# Patient Record
Sex: Male | Born: 1935 | Race: White | Hispanic: No | Smoking: Never smoker
Health system: Southern US, Community
[De-identification: ages and names within clinical notes are randomized; demographics above are authoritative.]

## PROBLEM LIST (undated history)

## (undated) DIAGNOSIS — I499 Cardiac arrhythmia, unspecified: Secondary | ICD-10-CM

## (undated) DIAGNOSIS — IMO0001 Reserved for inherently not codable concepts without codable children: Secondary | ICD-10-CM

## (undated) DIAGNOSIS — I482 Chronic atrial fibrillation, unspecified: Secondary | ICD-10-CM

## (undated) DIAGNOSIS — I872 Venous insufficiency (chronic) (peripheral): Secondary | ICD-10-CM

## (undated) DIAGNOSIS — N281 Cyst of kidney, acquired: Secondary | ICD-10-CM

## (undated) DIAGNOSIS — N183 Chronic kidney disease, stage 3 unspecified: Secondary | ICD-10-CM

## (undated) DIAGNOSIS — Z974 Presence of external hearing-aid: Secondary | ICD-10-CM

## (undated) DIAGNOSIS — E538 Deficiency of other specified B group vitamins: Secondary | ICD-10-CM

## (undated) DIAGNOSIS — E785 Hyperlipidemia, unspecified: Secondary | ICD-10-CM

## (undated) DIAGNOSIS — I7143 Infrarenal abdominal aortic aneurysm, without rupture: Secondary | ICD-10-CM

## (undated) DIAGNOSIS — R600 Localized edema: Secondary | ICD-10-CM

## (undated) DIAGNOSIS — Z7901 Long term (current) use of anticoagulants: Secondary | ICD-10-CM

## (undated) DIAGNOSIS — Z8679 Personal history of other diseases of the circulatory system: Secondary | ICD-10-CM

## (undated) DIAGNOSIS — G629 Polyneuropathy, unspecified: Secondary | ICD-10-CM

## (undated) DIAGNOSIS — N201 Calculus of ureter: Secondary | ICD-10-CM

## (undated) DIAGNOSIS — Z8739 Personal history of other diseases of the musculoskeletal system and connective tissue: Secondary | ICD-10-CM

## (undated) DIAGNOSIS — I714 Abdominal aortic aneurysm, without rupture: Secondary | ICD-10-CM

## (undated) DIAGNOSIS — I739 Peripheral vascular disease, unspecified: Secondary | ICD-10-CM

## (undated) DIAGNOSIS — H409 Unspecified glaucoma: Secondary | ICD-10-CM

## (undated) DIAGNOSIS — M1990 Unspecified osteoarthritis, unspecified site: Secondary | ICD-10-CM

## (undated) DIAGNOSIS — N1330 Unspecified hydronephrosis: Secondary | ICD-10-CM

## (undated) DIAGNOSIS — I509 Heart failure, unspecified: Secondary | ICD-10-CM

## (undated) DIAGNOSIS — Z87442 Personal history of urinary calculi: Secondary | ICD-10-CM

## (undated) HISTORY — DX: Reserved for inherently not codable concepts without codable children: IMO0001

## (undated) HISTORY — PX: TRANSTHORACIC ECHOCARDIOGRAM: SHX275

## (undated) HISTORY — DX: Unspecified osteoarthritis, unspecified site: M19.90

## (undated) HISTORY — DX: Chronic atrial fibrillation, unspecified: I48.20

## (undated) HISTORY — PX: ABDOMINAL AORTAGRAM: SHX5706

## (undated) HISTORY — DX: Deficiency of other specified B group vitamins: E53.8

## (undated) HISTORY — DX: Long term (current) use of anticoagulants: Z79.01

## (undated) HISTORY — DX: Heart failure, unspecified: I50.9

## (undated) HISTORY — DX: Chronic kidney disease, stage 3 unspecified: N18.30

## (undated) HISTORY — PX: OTHER SURGICAL HISTORY: SHX169

## (undated) HISTORY — DX: Chronic kidney disease, stage 3 (moderate): N18.3

## (undated) HISTORY — DX: Hyperlipidemia, unspecified: E78.5

## (undated) HISTORY — DX: Cardiac arrhythmia, unspecified: I49.9

## (undated) HISTORY — DX: Polyneuropathy, unspecified: G62.9

---

## 1968-12-25 HISTORY — PX: PERCUTANEOUS NEPHROSTOLITHOTOMY: SHX2207

## 1995-01-25 HISTORY — PX: REPAIR THORACIC AORTA: SUR1211

## 2003-04-19 ENCOUNTER — Emergency Department (HOSPITAL_COMMUNITY): Admission: EM | Admit: 2003-04-19 | Discharge: 2003-04-19 | Payer: Self-pay | Admitting: Emergency Medicine

## 2003-04-25 ENCOUNTER — Ambulatory Visit (HOSPITAL_COMMUNITY): Admission: RE | Admit: 2003-04-25 | Discharge: 2003-04-25 | Payer: Self-pay | Admitting: Cardiology

## 2003-04-25 HISTORY — PX: CARDIOVASCULAR STRESS TEST: SHX262

## 2003-04-27 HISTORY — PX: LAPAROSCOPIC CHOLECYSTECTOMY: SUR755

## 2003-05-03 ENCOUNTER — Inpatient Hospital Stay (HOSPITAL_COMMUNITY): Admission: RE | Admit: 2003-05-03 | Discharge: 2003-05-09 | Payer: Self-pay | Admitting: General Surgery

## 2003-05-24 ENCOUNTER — Encounter: Admission: RE | Admit: 2003-05-24 | Discharge: 2003-05-24 | Payer: Self-pay | Admitting: Cardiology

## 2003-06-05 ENCOUNTER — Encounter: Admission: RE | Admit: 2003-06-05 | Discharge: 2003-06-05 | Payer: Self-pay | Admitting: Cardiology

## 2003-07-16 ENCOUNTER — Ambulatory Visit (HOSPITAL_COMMUNITY): Admission: RE | Admit: 2003-07-16 | Discharge: 2003-07-17 | Payer: Self-pay | Admitting: Cardiology

## 2003-08-12 ENCOUNTER — Inpatient Hospital Stay (HOSPITAL_COMMUNITY): Admission: RE | Admit: 2003-08-12 | Discharge: 2003-08-14 | Payer: Self-pay | Admitting: Vascular Surgery

## 2003-08-28 ENCOUNTER — Ambulatory Visit (HOSPITAL_COMMUNITY): Admission: RE | Admit: 2003-08-28 | Discharge: 2003-08-28 | Payer: Self-pay | Admitting: Cardiology

## 2003-12-11 ENCOUNTER — Encounter: Admission: RE | Admit: 2003-12-11 | Discharge: 2003-12-11 | Payer: Self-pay | Admitting: Vascular Surgery

## 2004-03-13 ENCOUNTER — Ambulatory Visit: Payer: Self-pay | Admitting: Cardiology

## 2004-04-10 ENCOUNTER — Ambulatory Visit: Payer: Self-pay | Admitting: *Deleted

## 2004-05-08 ENCOUNTER — Ambulatory Visit: Payer: Self-pay | Admitting: *Deleted

## 2004-06-08 ENCOUNTER — Ambulatory Visit: Payer: Self-pay | Admitting: Cardiology

## 2004-06-10 ENCOUNTER — Encounter: Admission: RE | Admit: 2004-06-10 | Discharge: 2004-06-10 | Payer: Self-pay | Admitting: Vascular Surgery

## 2004-07-08 ENCOUNTER — Ambulatory Visit: Payer: Self-pay | Admitting: Cardiovascular Disease

## 2004-08-05 ENCOUNTER — Ambulatory Visit: Payer: Self-pay | Admitting: *Deleted

## 2004-09-03 ENCOUNTER — Ambulatory Visit: Payer: Self-pay | Admitting: Cardiology

## 2004-10-15 ENCOUNTER — Ambulatory Visit: Payer: Self-pay | Admitting: Cardiology

## 2004-10-23 ENCOUNTER — Ambulatory Visit (HOSPITAL_COMMUNITY): Admission: RE | Admit: 2004-10-23 | Discharge: 2004-10-23 | Payer: Self-pay | Admitting: Family Medicine

## 2004-11-18 ENCOUNTER — Ambulatory Visit: Payer: Self-pay | Admitting: *Deleted

## 2004-12-21 ENCOUNTER — Ambulatory Visit: Payer: Self-pay | Admitting: *Deleted

## 2005-01-18 ENCOUNTER — Ambulatory Visit: Payer: Self-pay | Admitting: Cardiology

## 2005-02-15 ENCOUNTER — Ambulatory Visit: Payer: Self-pay | Admitting: *Deleted

## 2005-03-23 ENCOUNTER — Ambulatory Visit: Payer: Self-pay | Admitting: *Deleted

## 2005-04-21 ENCOUNTER — Ambulatory Visit: Payer: Self-pay | Admitting: Cardiology

## 2005-05-24 ENCOUNTER — Ambulatory Visit: Payer: Self-pay | Admitting: *Deleted

## 2005-06-16 ENCOUNTER — Encounter: Admission: RE | Admit: 2005-06-16 | Discharge: 2005-06-16 | Payer: Self-pay | Admitting: Vascular Surgery

## 2005-06-25 ENCOUNTER — Ambulatory Visit: Payer: Self-pay | Admitting: *Deleted

## 2005-08-04 ENCOUNTER — Ambulatory Visit: Payer: Self-pay | Admitting: *Deleted

## 2005-09-01 ENCOUNTER — Ambulatory Visit: Payer: Self-pay | Admitting: *Deleted

## 2005-10-08 ENCOUNTER — Ambulatory Visit: Payer: Self-pay | Admitting: Cardiology

## 2005-11-03 ENCOUNTER — Ambulatory Visit: Payer: Self-pay | Admitting: Cardiology

## 2005-12-08 ENCOUNTER — Ambulatory Visit: Payer: Self-pay | Admitting: Cardiology

## 2006-01-12 ENCOUNTER — Ambulatory Visit: Payer: Self-pay | Admitting: Cardiology

## 2006-02-09 ENCOUNTER — Ambulatory Visit: Payer: Self-pay | Admitting: Cardiology

## 2006-03-15 ENCOUNTER — Ambulatory Visit: Payer: Self-pay | Admitting: Cardiology

## 2006-04-12 ENCOUNTER — Ambulatory Visit: Payer: Self-pay | Admitting: Cardiology

## 2006-05-17 ENCOUNTER — Ambulatory Visit: Payer: Self-pay | Admitting: Cardiovascular Disease

## 2006-06-15 ENCOUNTER — Encounter: Admission: RE | Admit: 2006-06-15 | Discharge: 2006-06-15 | Payer: Self-pay | Admitting: Vascular Surgery

## 2006-06-15 ENCOUNTER — Ambulatory Visit: Payer: Self-pay | Admitting: Vascular Surgery

## 2006-06-24 ENCOUNTER — Ambulatory Visit: Payer: Self-pay | Admitting: Cardiology

## 2006-07-22 ENCOUNTER — Ambulatory Visit: Payer: Self-pay | Admitting: Cardiology

## 2006-08-22 ENCOUNTER — Ambulatory Visit: Payer: Self-pay | Admitting: Internal Medicine

## 2006-09-20 ENCOUNTER — Ambulatory Visit: Payer: Self-pay | Admitting: Cardiology

## 2006-10-21 ENCOUNTER — Ambulatory Visit: Payer: Self-pay | Admitting: Cardiology

## 2006-11-04 ENCOUNTER — Ambulatory Visit: Payer: Self-pay | Admitting: Cardiology

## 2006-12-09 ENCOUNTER — Ambulatory Visit: Payer: Self-pay | Admitting: Cardiology

## 2007-01-06 ENCOUNTER — Ambulatory Visit: Payer: Self-pay | Admitting: Cardiology

## 2007-02-03 ENCOUNTER — Ambulatory Visit: Payer: Self-pay | Admitting: Internal Medicine

## 2007-03-10 ENCOUNTER — Ambulatory Visit: Payer: Self-pay | Admitting: Cardiology

## 2007-04-07 ENCOUNTER — Ambulatory Visit: Payer: Self-pay | Admitting: Cardiology

## 2007-05-05 ENCOUNTER — Ambulatory Visit: Payer: Self-pay | Admitting: Cardiology

## 2007-06-02 ENCOUNTER — Ambulatory Visit: Payer: Self-pay | Admitting: Cardiology

## 2007-06-30 ENCOUNTER — Ambulatory Visit: Payer: Self-pay | Admitting: Cardiology

## 2007-08-01 ENCOUNTER — Ambulatory Visit: Payer: Self-pay | Admitting: Cardiology

## 2007-08-31 ENCOUNTER — Ambulatory Visit: Payer: Self-pay | Admitting: Cardiology

## 2007-09-29 ENCOUNTER — Ambulatory Visit: Payer: Self-pay | Admitting: Cardiology

## 2007-11-01 ENCOUNTER — Ambulatory Visit: Payer: Self-pay | Admitting: Cardiology

## 2007-12-05 ENCOUNTER — Ambulatory Visit: Payer: Self-pay | Admitting: Cardiology

## 2008-01-02 ENCOUNTER — Encounter: Admission: RE | Admit: 2008-01-02 | Discharge: 2008-01-02 | Payer: Self-pay | Admitting: Vascular Surgery

## 2008-01-02 ENCOUNTER — Ambulatory Visit: Payer: Self-pay | Admitting: Vascular Surgery

## 2008-01-04 ENCOUNTER — Ambulatory Visit: Payer: Self-pay | Admitting: Cardiology

## 2008-02-05 ENCOUNTER — Ambulatory Visit: Payer: Self-pay | Admitting: Cardiology

## 2008-02-07 ENCOUNTER — Encounter: Payer: Self-pay | Admitting: Cardiology

## 2008-02-07 ENCOUNTER — Ambulatory Visit (HOSPITAL_COMMUNITY): Admission: RE | Admit: 2008-02-07 | Discharge: 2008-02-07 | Payer: Self-pay | Admitting: Cardiology

## 2008-02-07 ENCOUNTER — Ambulatory Visit: Payer: Self-pay | Admitting: Cardiology

## 2008-03-07 ENCOUNTER — Ambulatory Visit: Payer: Self-pay | Admitting: Cardiology

## 2008-04-04 ENCOUNTER — Ambulatory Visit: Payer: Self-pay | Admitting: Cardiology

## 2008-04-22 ENCOUNTER — Ambulatory Visit: Payer: Self-pay | Admitting: Cardiology

## 2008-05-20 ENCOUNTER — Ambulatory Visit: Payer: Self-pay | Admitting: Cardiology

## 2008-06-17 ENCOUNTER — Ambulatory Visit: Payer: Self-pay | Admitting: Cardiology

## 2008-07-18 ENCOUNTER — Ambulatory Visit: Payer: Self-pay | Admitting: Cardiology

## 2008-08-22 ENCOUNTER — Ambulatory Visit: Payer: Self-pay | Admitting: Cardiology

## 2008-09-19 ENCOUNTER — Ambulatory Visit: Payer: Self-pay | Admitting: Cardiology

## 2008-10-21 ENCOUNTER — Ambulatory Visit: Payer: Self-pay | Admitting: Cardiology

## 2008-11-13 ENCOUNTER — Encounter (INDEPENDENT_AMBULATORY_CARE_PROVIDER_SITE_OTHER): Payer: Self-pay | Admitting: *Deleted

## 2008-11-13 LAB — CONVERTED CEMR LAB
CO2: 26 meq/L
Calcium: 9.6 mg/dL
Chloride: 100 meq/L
Glucose, Bld: 98 mg/dL
Hemoglobin: 13.5 g/dL
Prothrombin Time: 12.8 s
Sodium: 136 meq/L

## 2008-11-18 ENCOUNTER — Ambulatory Visit: Payer: Self-pay | Admitting: Cardiology

## 2008-12-09 ENCOUNTER — Encounter: Payer: Self-pay | Admitting: *Deleted

## 2008-12-23 ENCOUNTER — Ambulatory Visit: Payer: Self-pay | Admitting: Cardiology

## 2008-12-23 LAB — CONVERTED CEMR LAB: POC INR: 2.6

## 2009-01-13 ENCOUNTER — Encounter (INDEPENDENT_AMBULATORY_CARE_PROVIDER_SITE_OTHER): Payer: Self-pay | Admitting: *Deleted

## 2009-01-16 ENCOUNTER — Ambulatory Visit: Payer: Self-pay | Admitting: Vascular Surgery

## 2009-01-16 ENCOUNTER — Encounter: Payer: Self-pay | Admitting: Cardiology

## 2009-01-16 ENCOUNTER — Encounter: Admission: RE | Admit: 2009-01-16 | Discharge: 2009-01-16 | Payer: Self-pay | Admitting: Vascular Surgery

## 2009-01-23 ENCOUNTER — Ambulatory Visit: Payer: Self-pay | Admitting: Cardiology

## 2009-01-23 LAB — CONVERTED CEMR LAB: POC INR: 2.4

## 2009-02-18 DIAGNOSIS — I482 Chronic atrial fibrillation, unspecified: Secondary | ICD-10-CM

## 2009-02-18 DIAGNOSIS — E785 Hyperlipidemia, unspecified: Secondary | ICD-10-CM | POA: Insufficient documentation

## 2009-02-18 DIAGNOSIS — M109 Gout, unspecified: Secondary | ICD-10-CM

## 2009-02-21 ENCOUNTER — Ambulatory Visit: Payer: Self-pay | Admitting: Cardiology

## 2009-02-21 ENCOUNTER — Encounter (INDEPENDENT_AMBULATORY_CARE_PROVIDER_SITE_OTHER): Payer: Self-pay | Admitting: *Deleted

## 2009-02-21 DIAGNOSIS — E538 Deficiency of other specified B group vitamins: Secondary | ICD-10-CM

## 2009-02-25 ENCOUNTER — Encounter: Payer: Self-pay | Admitting: Cardiology

## 2009-02-26 ENCOUNTER — Encounter (INDEPENDENT_AMBULATORY_CARE_PROVIDER_SITE_OTHER): Payer: Self-pay | Admitting: *Deleted

## 2009-03-03 ENCOUNTER — Encounter (INDEPENDENT_AMBULATORY_CARE_PROVIDER_SITE_OTHER): Payer: Self-pay | Admitting: *Deleted

## 2009-03-03 LAB — CONVERTED CEMR LAB
OCCULT 1: NEGATIVE
OCCULT 1: NEGATIVE
OCCULT 2: NEGATIVE
OCCULT 2: NEGATIVE
OCCULT 3: NEGATIVE

## 2009-03-27 ENCOUNTER — Ambulatory Visit: Payer: Self-pay | Admitting: Cardiology

## 2009-04-24 ENCOUNTER — Ambulatory Visit: Payer: Self-pay | Admitting: Cardiology

## 2009-04-24 LAB — CONVERTED CEMR LAB: POC INR: 2.3

## 2009-05-01 ENCOUNTER — Encounter (INDEPENDENT_AMBULATORY_CARE_PROVIDER_SITE_OTHER): Payer: Self-pay

## 2009-05-01 LAB — CONVERTED CEMR LAB
BUN: 38 mg/dL — ABNORMAL HIGH (ref 6–23)
CO2: 24 meq/L (ref 19–32)
Calcium: 9.3 mg/dL (ref 8.4–10.5)
Chloride: 107 meq/L (ref 96–112)
Creatinine, Ser: 1.82 mg/dL — ABNORMAL HIGH (ref 0.40–1.50)
Digitoxin Lvl: 1.7 ng/mL (ref 0.8–2.0)
Glucose, Bld: 86 mg/dL (ref 70–99)
LDL Cholesterol: 76 mg/dL (ref 0–99)

## 2009-05-12 ENCOUNTER — Ambulatory Visit (HOSPITAL_COMMUNITY): Admission: RE | Admit: 2009-05-12 | Discharge: 2009-05-12 | Payer: Self-pay | Admitting: Family Medicine

## 2009-05-19 ENCOUNTER — Ambulatory Visit (HOSPITAL_COMMUNITY): Admission: RE | Admit: 2009-05-19 | Discharge: 2009-05-19 | Payer: Self-pay | Admitting: Cardiology

## 2009-05-19 ENCOUNTER — Ambulatory Visit: Payer: Self-pay | Admitting: Cardiology

## 2009-05-19 LAB — CONVERTED CEMR LAB: POC INR: 2.3

## 2009-06-18 ENCOUNTER — Ambulatory Visit: Payer: Self-pay | Admitting: Cardiology

## 2009-07-17 ENCOUNTER — Ambulatory Visit: Payer: Self-pay | Admitting: Cardiology

## 2009-07-17 LAB — CONVERTED CEMR LAB: POC INR: 2.2

## 2009-08-14 ENCOUNTER — Ambulatory Visit: Payer: Self-pay | Admitting: Cardiology

## 2009-08-14 LAB — CONVERTED CEMR LAB: POC INR: 2

## 2009-09-11 ENCOUNTER — Ambulatory Visit: Payer: Self-pay | Admitting: Cardiology

## 2009-09-24 ENCOUNTER — Encounter (INDEPENDENT_AMBULATORY_CARE_PROVIDER_SITE_OTHER): Payer: Self-pay | Admitting: *Deleted

## 2009-09-24 LAB — CONVERTED CEMR LAB
ALT: 10 units/L
Albumin: 4.5 g/dL
Alkaline Phosphatase: 67 units/L
Bilirubin, Direct: 0.2 mg/dL
CO2: 24 meq/L
Glucose, Bld: 103 mg/dL
LDL Cholesterol: 78 mg/dL
Potassium: 5.3 meq/L
Sodium: 140 meq/L
Total Protein: 7.4 g/dL

## 2009-09-25 LAB — CONVERTED CEMR LAB
Alkaline Phosphatase: 67 units/L
BUN: 35 mg/dL
Bilirubin, Direct: 0.2 mg/dL
CO2: 24 meq/L
Cholesterol: 134 mg/dL
Creatinine, Ser: 2.03 mg/dL
Glucose, Bld: 103 mg/dL
Triglycerides: 99 mg/dL

## 2009-10-09 ENCOUNTER — Encounter (INDEPENDENT_AMBULATORY_CARE_PROVIDER_SITE_OTHER): Payer: Self-pay | Admitting: *Deleted

## 2009-10-09 ENCOUNTER — Ambulatory Visit: Payer: Self-pay | Admitting: Cardiology

## 2009-11-12 ENCOUNTER — Ambulatory Visit: Payer: Self-pay | Admitting: Cardiology

## 2009-11-12 LAB — CONVERTED CEMR LAB: POC INR: 1.9

## 2009-12-10 ENCOUNTER — Ambulatory Visit: Payer: Self-pay | Admitting: Cardiology

## 2009-12-10 LAB — CONVERTED CEMR LAB: POC INR: 1.5

## 2009-12-24 ENCOUNTER — Ambulatory Visit: Payer: Self-pay | Admitting: Cardiology

## 2010-01-21 ENCOUNTER — Encounter (INDEPENDENT_AMBULATORY_CARE_PROVIDER_SITE_OTHER): Payer: Self-pay

## 2010-01-21 ENCOUNTER — Ambulatory Visit: Payer: Self-pay | Admitting: Cardiology

## 2010-01-27 ENCOUNTER — Encounter: Payer: Self-pay | Admitting: Cardiology

## 2010-02-02 ENCOUNTER — Ambulatory Visit: Payer: Self-pay | Admitting: Cardiology

## 2010-02-02 ENCOUNTER — Encounter (INDEPENDENT_AMBULATORY_CARE_PROVIDER_SITE_OTHER): Payer: Self-pay | Admitting: *Deleted

## 2010-02-02 LAB — CONVERTED CEMR LAB
BUN: 29 mg/dL — ABNORMAL HIGH (ref 6–23)
CO2: 29 meq/L (ref 19–32)
Cholesterol: 123 mg/dL (ref 0–200)
Glucose, Bld: 97 mg/dL (ref 70–99)
POC INR: 2.2
Potassium: 5.5 meq/L — ABNORMAL HIGH (ref 3.5–5.3)
Sodium: 144 meq/L (ref 135–145)
Total CHOL/HDL Ratio: 3
VLDL: 16 mg/dL (ref 0–40)

## 2010-02-18 ENCOUNTER — Ambulatory Visit: Payer: Self-pay | Admitting: Vascular Surgery

## 2010-02-18 ENCOUNTER — Encounter: Admission: RE | Admit: 2010-02-18 | Discharge: 2010-02-18 | Payer: Self-pay | Admitting: Vascular Surgery

## 2010-03-02 ENCOUNTER — Ambulatory Visit: Payer: Self-pay | Admitting: Cardiology

## 2010-03-02 LAB — CONVERTED CEMR LAB: POC INR: 2.1

## 2010-03-31 LAB — CONVERTED CEMR LAB
ALT: 9 units/L (ref 0–53)
Basophils Absolute: 0 10*3/uL (ref 0.0–0.1)
CO2: 28 meq/L (ref 19–32)
Calcium: 9.1 mg/dL (ref 8.4–10.5)
Chloride: 106 meq/L (ref 96–112)
Cholesterol: 126 mg/dL (ref 0–200)
Creatinine, Ser: 2.13 mg/dL — ABNORMAL HIGH (ref 0.40–1.50)
Eosinophils Relative: 2 % (ref 0–5)
Glucose, Bld: 103 mg/dL — ABNORMAL HIGH (ref 70–99)
HCT: 41.7 % (ref 39.0–52.0)
Hemoglobin: 13.5 g/dL (ref 13.0–17.0)
Lymphocytes Relative: 20 % (ref 12–46)
Lymphs Abs: 1 10*3/uL (ref 0.7–4.0)
Monocytes Absolute: 0.4 10*3/uL (ref 0.1–1.0)
Monocytes Relative: 8 % (ref 3–12)
Neutro Abs: 3.4 10*3/uL (ref 1.7–7.7)
RBC: 4.15 M/uL — ABNORMAL LOW (ref 4.22–5.81)
RDW: 13.9 % (ref 11.5–15.5)
Total CHOL/HDL Ratio: 3.3
Total Protein: 7 g/dL (ref 6.0–8.3)
Triglycerides: 79 mg/dL (ref <150)
WBC: 4.9 10*3/uL (ref 4.0–10.5)

## 2010-04-02 ENCOUNTER — Ambulatory Visit: Payer: Self-pay | Admitting: Cardiology

## 2010-04-02 LAB — CONVERTED CEMR LAB: POC INR: 2.6

## 2010-04-26 HISTORY — PX: CATARACT EXTRACTION W/ INTRAOCULAR LENS  IMPLANT, BILATERAL: SHX1307

## 2010-04-30 ENCOUNTER — Ambulatory Visit: Admission: RE | Admit: 2010-04-30 | Discharge: 2010-04-30 | Payer: Self-pay | Source: Home / Self Care

## 2010-04-30 LAB — CONVERTED CEMR LAB: POC INR: 3.2

## 2010-05-16 ENCOUNTER — Encounter: Payer: Self-pay | Admitting: Cardiology

## 2010-05-18 ENCOUNTER — Encounter: Payer: Self-pay | Admitting: Nephrology

## 2010-05-26 NOTE — Miscellaneous (Signed)
Summary: medication change  Clinical Lists Changes  Medications: Changed medication from DIGOXIN 0.125 MG TABS (DIGOXIN) take 1 tab daily to DIGOXIN 0.125 MG TABS (DIGOXIN) take 1 tablet by mouth 5 days a week then hold for 2 days

## 2010-05-26 NOTE — Medication Information (Signed)
Summary: ccr-lr  Anticoagulant Therapy  Managed by: Vashti Hey, RN PCP: Dr. Lubertha South Supervising MD: Dietrich Pates MD, Molly Maduro Indication 1: Atrial Fibrillation (ICD-427.31) Lab Used: [facility] [place] Site: [place] INR POC 2.8  Dietary changes: no    Health status changes: no    Bleeding/hemorrhagic complications: no    Recent/future hospitalizations: no    Any changes in medication regimen? no    Recent/future dental: no  Any missed doses?: no       Is patient compliant with meds? yes       Allergies: No Known Drug Allergies  Anticoagulation Management History:      The patient is taking warfarin and comes in today for a routine follow up visit.  Positive risk factors for bleeding include an age of 75 years or older and presence of serious comorbidities.  The bleeding index is 'intermediate risk'.  Positive CHADS2 values include History of HTN and Age > 50 years old.  The start date was 10/14/1997.  His last INR was 1.0.  Anticoagulation responsible provider: Dietrich Pates MD, Molly Maduro.  INR POC: 2.8.  Cuvette Lot#: [id number].  Exp: 10/11.    Anticoagulation Management Assessment/Plan:      The patient's current anticoagulation dose is Coumadin 5 mg tabs: take as directed by coumadin clinic.  The target INR is 2 - 3.  The next INR is due 01/21/2010.  Anticoagulation instructions were given to patient.  Results were reviewed/authorized by Vashti Hey, RN.  He was notified by Vashti Hey RN.         Prior Anticoagulation Instructions: INR 1.5 Take coumadin 1 1/2 tablets tonight then increase dose to 1/2 tablet once daily except 1 tablet on Wednesdays and Saturdays  Current Anticoagulation Instructions: INR 2.8 Continue coumadin 2.5mg  once daily except 5mg  on Wednesdays and Saturdays

## 2010-05-26 NOTE — Medication Information (Signed)
Summary: ccr-lr  Anticoagulant Therapy  Managed by: Vashti Hey, RN PCP: Dr. Lubertha South Supervising MD: Daleen Squibb MD, Maisie Fus Indication 1: Atrial Fibrillation (ICD-427.31) Lab Used: [facility] [place] Site: [place] INR POC 1.5  Dietary changes: no    Health status changes: no    Bleeding/hemorrhagic complications: no    Recent/future hospitalizations: no    Any changes in medication regimen? no    Recent/future dental: no  Any missed doses?: no       Is patient compliant with meds? yes       Allergies: No Known Drug Allergies  Anticoagulation Management History:      The patient is taking warfarin and comes in today for a routine follow up visit.  Positive risk factors for bleeding include an age of 75 years or older and presence of serious comorbidities.  The bleeding index is 'intermediate risk'.  Positive CHADS2 values include History of HTN and Age > 68 years old.  The start date was 10/14/1997.  His last INR was 1.0.  Anticoagulation responsible provider: Daleen Squibb MD, Maisie Fus.  INR POC: 1.5.  Cuvette Lot#: [id number].  Exp: 10/11.    Anticoagulation Management Assessment/Plan:      The patient's current anticoagulation dose is Coumadin 5 mg tabs: take as directed by coumadin clinic.  The target INR is 2 - 3.  The next INR is due 12/24/2009.  Anticoagulation instructions were given to patient.  Results were reviewed/authorized by Vashti Hey, RN.  He was notified by Vashti Hey RN.         Prior Anticoagulation Instructions: INR 1.9 Increase coumadin 2.5mg  once daily except 5mg  on Wednesdays  Current Anticoagulation Instructions: INR 1.5 Take coumadin 1 1/2 tablets tonight then increase dose to 1/2 tablet once daily except 1 tablet on Wednesdays and Saturdays

## 2010-05-26 NOTE — Miscellaneous (Signed)
Summary: LABS BMP,LIPIDS,LIVER,09/24/2009  Clinical Lists Changes  Observations: Added new observation of CALCIUM: 10.1 mg/dL (29/93/7169 6:78) Added new observation of ALBUMIN: 4.5 g/dL (93/81/0175 1:02) Added new observation of PROTEIN, TOT: 7.4 g/dL (58/52/7782 4:23) Added new observation of SGPT (ALT): 10 units/L (09/24/2009 9:33) Added new observation of SGOT (AST): 12 units/L (09/24/2009 9:33) Added new observation of ALK PHOS: 67 units/L (09/24/2009 9:33) Added new observation of BILI DIRECT: 0.2 mg/dL (53/61/4431 5:40) Added new observation of CREATININE: 2.03 mg/dL (08/67/6195 0:93) Added new observation of BUN: 35 mg/dL (26/71/2458 0:99) Added new observation of BG RANDOM: 103 mg/dL (83/38/2505 3:97) Added new observation of CO2 PLSM/SER: 24 meq/L (09/24/2009 9:33) Added new observation of CL SERUM: 104 meq/L (09/24/2009 9:33) Added new observation of K SERUM: 5.3 meq/L (09/24/2009 9:33) Added new observation of NA: 140 meq/L (09/24/2009 9:33) Added new observation of LDL: 78 mg/dL (67/34/1937 9:02) Added new observation of HDL: 36 mg/dL (40/97/3532 9:92) Added new observation of TRIGLYC TOT: 99 mg/dL (42/68/3419 6:22) Added new observation of CHOLESTEROL: 134 mg/dL (29/79/8921 1:94)

## 2010-05-26 NOTE — Medication Information (Signed)
Summary: ccr-lr  Anticoagulant Therapy  Managed by: Vashti Hey, RN PCP: Dr. Lubertha South Supervising MD: Dietrich Pates MD, Molly Maduro Indication 1: Atrial Fibrillation (ICD-427.31) Lab Used: [facility] [place] Site: [place] INR POC 3.9  Dietary changes: no    Health status changes: no    Bleeding/hemorrhagic complications: no    Recent/future hospitalizations: no    Any changes in medication regimen? no    Recent/future dental: no  Any missed doses?: no       Is patient compliant with meds? yes       Allergies: No Known Drug Allergies  Anticoagulation Management History:      The patient is taking warfarin and comes in today for a routine follow up visit.  Positive risk factors for bleeding include an age of 75 years or older and presence of serious comorbidities.  The bleeding index is 'intermediate risk'.  Positive CHADS2 values include History of HTN and Age > 55 years old.  The start date was 10/14/1997.  His last INR was 1.0.  Anticoagulation responsible provider: Dietrich Pates MD, Molly Maduro.  INR POC: 3.9.  Cuvette Lot#: [id number].  Exp: 10/11.    Anticoagulation Management Assessment/Plan:      The patient's current anticoagulation dose is Coumadin 5 mg tabs: take as directed by coumadin clinic.  The target INR is 2 - 3.  The next INR is due 02/02/2010.  Anticoagulation instructions were given to patient.  Results were reviewed/authorized by Vashti Hey, RN.  He was notified by Vashti Hey RN.         Prior Anticoagulation Instructions: INR 2.8 Continue coumadin 2.5mg  once daily except 5mg  on Wednesdays and Saturdays  Current Anticoagulation Instructions: INR 3.9 Hold coumadin tonight then decrease dose to 2.5mg  once daily except 5mg  on Wednesdays

## 2010-05-26 NOTE — Medication Information (Signed)
Summary: ccr-lr  Anticoagulant Therapy  Managed by: Vashti Hey, RN PCP: Dr. Lubertha South Supervising MD: Dietrich Pates MD, Molly Maduro Indication 1: Atrial Fibrillation (ICD-427.31) Lab Used: [facility] [place] Site: [place] INR POC 2.0  Dietary changes: no    Health status changes: no    Bleeding/hemorrhagic complications: no    Recent/future hospitalizations: no    Any changes in medication regimen? no    Recent/future dental: no  Any missed doses?: no       Is patient compliant with meds? yes       Allergies: No Known Drug Allergies  Anticoagulation Management History:      The patient is taking warfarin and comes in today for a routine follow up visit.  Positive risk factors for bleeding include an age of 75 years or older and presence of serious comorbidities.  The bleeding index is 'intermediate risk'.  Positive CHADS2 values include History of HTN and Age > 55 years old.  The start date was 10/14/1997.  His last INR was 1.0.  Anticoagulation responsible provider: Dietrich Pates MD, Molly Maduro.  INR POC: 2.0.  Cuvette Lot#: [id number].  Exp: 10/11.    Anticoagulation Management Assessment/Plan:      The patient's current anticoagulation dose is Coumadin 5 mg tabs: take as directed by coumadin clinic.  The target INR is 2 - 3.  The next INR is due 11/12/2009.  Anticoagulation instructions were given to patient.  Results were reviewed/authorized by Vashti Hey, RN.  He was notified by Vashti Hey RN.         Prior Anticoagulation Instructions: INR 2.3 Continue coumadin 2.5mg  once daily   Current Anticoagulation Instructions: INR 2.0 Take coumadin 1 tablet tonight then resume 1/2 tablet once daily

## 2010-05-26 NOTE — Medication Information (Signed)
Summary: ccr-lr  Anticoagulant Therapy  Managed by: Vashti Hey, RN PCP: Dr. Lubertha South Supervising MD: Dietrich Pates MD, Molly Maduro Indication 1: Atrial Fibrillation (ICD-427.31) Lab Used: [facility] [place] Site: [place] INR POC 2.2  Dietary changes: no    Health status changes: no    Bleeding/hemorrhagic complications: no    Recent/future hospitalizations: no    Any changes in medication regimen? no    Recent/future dental: no  Any missed doses?: no       Is patient compliant with meds? yes       Allergies: No Known Drug Allergies  Anticoagulation Management History:      The patient is taking warfarin and comes in today for a routine follow up visit.  Positive risk factors for bleeding include an age of 75 years or older and presence of serious comorbidities.  The bleeding index is 'intermediate risk'.  Positive CHADS2 values include History of HTN and Age > 75 years old.  The start date was 10/14/1997.  His last INR was 1.0.  Anticoagulation responsible provider: Dietrich Pates MD, Molly Maduro.  INR POC: 2.2.  Cuvette Lot#: [id number].  Exp: 10/11.    Anticoagulation Management Assessment/Plan:      The patient's current anticoagulation dose is Coumadin 5 mg tabs: take as directed by coumadin clinic.  The target INR is 2 - 3.  The next INR is due 03/02/2010.  Anticoagulation instructions were given to patient.  Results were reviewed/authorized by Vashti Hey, RN.  He was notified by Vashti Hey RN.         Prior Anticoagulation Instructions: INR 3.9 Hold coumadin tonight then decrease dose to 2.5mg  once daily except 5mg  on Wednesdays  Current Anticoagulation Instructions: INR 2.2 Continue coumadin 2.5mg  once daily except 5mg  on Wednesdays

## 2010-05-26 NOTE — Medication Information (Signed)
Summary: ccr-lr  Anticoagulant Therapy  Managed by: Vashti Hey, RN PCP: Dr. Lubertha South Supervising MD: Daleen Squibb MD, Maisie Fus Indication 1: Atrial Fibrillation (ICD-427.31) Lab Used: [facility] [place] Site: [place] INR POC 1.9  Dietary changes: no    Health status changes: no    Bleeding/hemorrhagic complications: no    Recent/future hospitalizations: no    Any changes in medication regimen? no    Recent/future dental: no  Any missed doses?: no       Is patient compliant with meds? yes       Allergies: No Known Drug Allergies  Anticoagulation Management History:      The patient is taking warfarin and comes in today for a routine follow up visit.  Positive risk factors for bleeding include an age of 75 years or older and presence of serious comorbidities.  The bleeding index is 'intermediate risk'.  Positive CHADS2 values include History of HTN and Age > 85 years old.  The start date was 10/14/1997.  His last INR was 1.0.  Anticoagulation responsible provider: Daleen Squibb MD, Maisie Fus.  INR POC: 1.9.  Cuvette Lot#: [id number].  Exp: 10/11.    Anticoagulation Management Assessment/Plan:      The patient's current anticoagulation dose is Coumadin 5 mg tabs: take as directed by coumadin clinic.  The target INR is 2 - 3.  The next INR is due 12/10/2009.  Anticoagulation instructions were given to patient.  Results were reviewed/authorized by Vashti Hey, RN.  He was notified by Vashti Hey RN.         Prior Anticoagulation Instructions: INR 2.0 Take coumadin 1 tablet tonight then resume 1/2 tablet once daily   Current Anticoagulation Instructions: INR 1.9 Increase coumadin 2.5mg  once daily except 5mg  on Wednesdays

## 2010-05-26 NOTE — Medication Information (Signed)
Summary: ccr-lr  Anticoagulant Therapy  Managed by: Vashti Hey, RN PCP: Dr. Lubertha South Supervising MD: Dietrich Pates MD, Molly Maduro Indication 1: Atrial Fibrillation (ICD-427.31) Lab Used: [facility] [place] Site: [place] INR POC 2.1  Dietary changes: no    Health status changes: no    Bleeding/hemorrhagic complications: no    Recent/future hospitalizations: no    Any changes in medication regimen? no    Recent/future dental: no  Any missed doses?: no       Is patient compliant with meds? yes       Allergies: No Known Drug Allergies  Anticoagulation Management History:      The patient is taking warfarin and comes in today for a routine follow up visit.  Positive risk factors for bleeding include an age of 75 years or older and presence of serious comorbidities.  The bleeding index is 'intermediate risk'.  Positive CHADS2 values include History of HTN and Age > 57 years old.  The start date was 10/14/1997.  His last INR was 1.0.  Anticoagulation responsible provider: Dietrich Pates MD, Molly Maduro.  INR POC: 2.1.  Cuvette Lot#: [id number].  Exp: 10/11.    Anticoagulation Management Assessment/Plan:      The patient's current anticoagulation dose is Coumadin 5 mg tabs: take as directed by coumadin clinic.  The target INR is 2 - 3.  The next INR is due 04/02/2010.  Anticoagulation instructions were given to patient.  Results were reviewed/authorized by Vashti Hey, RN.  He was notified by Vashti Hey RN.         Prior Anticoagulation Instructions: INR 2.2 Continue coumadin 2.5mg  once daily except 5mg  on Wednesdays  Current Anticoagulation Instructions: INR 2.1 Continue coumadin 2.5mg  once daily except 5mg  on Wednesdays

## 2010-05-26 NOTE — Medication Information (Signed)
Summary: ccr-lr  Anticoagulant Therapy  Managed by: Vashti Hey, RN PCP: Dr. Lubertha South Supervising MD: Diona Browner MD, Remi Deter Indication 1: Atrial Fibrillation (ICD-427.31) Lab Used: [facility] [place] Site: [place] INR POC 2.2  Dietary changes: no    Health status changes: no    Bleeding/hemorrhagic complications: no    Recent/future hospitalizations: no    Any changes in medication regimen? no    Recent/future dental: no  Any missed doses?: no       Is patient compliant with meds? yes       Allergies: No Known Drug Allergies  Anticoagulation Management History:      The patient is taking warfarin and comes in today for a routine follow up visit.  Positive risk factors for bleeding include an age of 75 years or older and presence of serious comorbidities.  The bleeding index is 'intermediate risk'.  Positive CHADS2 values include History of HTN and Age > 95 years old.  The start date was 10/14/1997.  His last INR was 1.0.  Anticoagulation responsible provider: Diona Browner MD, Remi Deter.  INR POC: 2.2.  Cuvette Lot#: [id number].  Exp: 10/11.    Anticoagulation Management Assessment/Plan:      The patient's current anticoagulation dose is Coumadin 5 mg tabs: take as directed by coumadin clinic.  The target INR is 2 - 3.  The next INR is due 08/14/2009.  Anticoagulation instructions were given to patient.  Results were reviewed/authorized by Vashti Hey, RN.  He was notified by Vashti Hey RN.         Prior Anticoagulation Instructions: INR 2.1 Continue coumadin 2.5mg  once daily   Current Anticoagulation Instructions: INR 2.2 Continue coumadin 2.5mg  once daily

## 2010-05-26 NOTE — Medication Information (Signed)
Summary: ccr-lr  Anticoagulant Therapy  Managed by: Vashti Hey, RN PCP: Dr. Lubertha South Supervising MD: Diona Browner MD, Remi Deter Indication 1: Atrial Fibrillation (ICD-427.31) Lab Used: [facility]  Site: [place] INR POC 2.0  Dietary changes: no    Health status changes: no    Bleeding/hemorrhagic complications: no    Recent/future hospitalizations: no    Any changes in medication regimen? no    Recent/future dental: no  Any missed doses?: no       Is patient compliant with meds? yes       Allergies: No Known Drug Allergies  Anticoagulation Management History:      The patient is taking warfarin and comes in today for a routine follow up visit.  Positive risk factors for bleeding include an age of 27 years or older and presence of serious comorbidities.  The bleeding index is 'intermediate risk'.  Positive CHADS2 values include History of HTN and Age > 40 years old.  The start date was 10/14/1997.  His last INR was 1.0.  Anticoagulation responsible provider: Diona Browner MD, Remi Deter.  INR POC: 2.0.  Cuvette Lot#: [id number].  Exp: 10/11.    Anticoagulation Management Assessment/Plan:      The patient's current anticoagulation dose is Coumadin 5 mg tabs: take as directed by coumadin clinic.  The target INR is 2 - 3.  The next INR is due 09/11/2009.  Anticoagulation instructions were given to patient.  Results were reviewed/authorized by Vashti Hey, RN.  He was notified by Vashti Hey RN.         Prior Anticoagulation Instructions: INR 2.2 Continue coumadin 2.5mg  once daily   Current Anticoagulation Instructions: INR 2.0 Take coumadin 5mg  tonight then resume 2.5mg  once daily

## 2010-05-26 NOTE — Miscellaneous (Signed)
Summary: Orders Update: Lipids and LFT's  Clinical Lists Changes  Orders: Added new Test order of T-Lipid Profile [id number]) - Signed Added new Test order of T-Basic Metabolic Panel ([id number]) - Signed Pt. requested to have labs done before f/u with Dr. Dietrich Pates on 02-02-10.

## 2010-05-26 NOTE — Assessment & Plan Note (Signed)
Summary: 1 YR F/U PER CHECKOUT ON 02/20/09/TG  Medications Added LORTAB 5-500 MG TABS (HYDROCODONE-ACETAMINOPHEN) take as needed ACAI BERRY 500 MG CAPS (ACAI) take 1 tab daily      Allergies Added: NKDA  Visit Type:  Follow-up Primary [name]:  Dr. Lubertha Orr   History of Present Illness: [name] returns to the office as scheduled for continued assessment and treatment of widespread vascular disease without known coronary artery disease,multiple vascular risk factors, chronic atrial fibrillation and chronic kidney disease.  Despite his impressive list of diagnoses and advanced age, he is doing amazingly well.  He has recovered from the grief of losing his wife and is now active in his church.  He has not been hospitalized or seen in the emergency department in the past year.  He developed an episode of cellulitis on the left leg that responded to treatment as an outpatient.  He has had a chronic nonhealing ulceration of the skin over his left shin for approximately the past 12 months.  This is being followed by Dr. Gerda Orr.  He describes some fullness over the left abdomen intermittently without pain, change in bowel habit or other GI symptoms.  His chronic kidney disease has been very slowly progressive from a creatinine of 1.5 in 2001 to 2.3 this month.  He has noted a progressive weight loss over the past year, but he attributes this to a change in diet following the death of his wife.  [name] also reports intermittent cracking in his ears--with closer questioning, it is not clear whether this represents equalization of pressures or adventitious sounds.  He has had cerumen extraction in the past, but cannot say whether this improved his symptoms.     Current Medications (verified): 1)  Coumadin 5 Mg Tabs (Warfarin Sodium) .... Take As Directed By Coumadin Clinic 2)  Furosemide 40 Mg Tabs (Furosemide) .... Take 1 Tab Daily 3)  Lopressor 100 Mg Tabs (Metoprolol Tartrate) .... Take 1  Tab Two Times A Day 4)  Cozaar 50 Mg Tabs (Losartan Potassium) .... Take 1 Tab Daily 5)  Digoxin 0.125 Mg Tabs (Digoxin) .... Take 1 Tablet By Mouth 5 Days A Week Then Hold For 2 Days 6)  Vitamin B-12 100 Mcg Tabs (Cyanocobalamin) .... Take 1 Tab Daily 7)  Amlodipine Besylate 5 Mg Tabs (Amlodipine Besylate) .... Take 1 Tab Daily 8)  Simvastatin 20 Mg Tabs (Simvastatin) .... Take 1 Tab Daily 9)  Lortab 5-500 Mg Tabs (Hydrocodone-Acetaminophen) .... Take As Needed 10)  Acai Berry 500 Mg Caps (Acai) .... Take 1 Tab Daily  Allergies (verified): No Known Drug Allergies  Comments:  Nurse/Medical Assistant: patient brought med bottles he is taking acai berry wants to know your opinion on this med,called Edward luking for last ov and labs   Past History:  PMH, FH, and Social History reviewed and updated.  Review of Systems       The patient complains of weight loss.  The patient denies anorexia, fever, vision loss, decreased hearing, hoarseness, chest pain, syncope, dyspnea on exertion, prolonged cough, headaches, abdominal pain, melena, and hematochezia.    Vital Signs:  Patient profile:   77 year old male Weight:      200 pounds BMI:     31.44 Pulse rate:   59 / minute BP sitting:   143 / 80  (right arm)  Vitals Entered By: Dreama Saa, CNA (February 02, 2010 10:57 AM)  Physical Exam  General:  Mildly overweight; well developed; no acute  distress:   Neck-No JVD; no carotid bruits: HEENT-excess cerumen Lungs-No tachypnea, no rales; no rhonchi; no wheezes: Cardiovascular-Irregular rhythm;normal PMI; normal S1 and S2:grade 1-2/6 holosystolic murmur at the cardiac apex and left sternal border Abdomen-BS normal; soft and non-tender without masses or organomegaly: aortic pulsation not appreciated Musculoskeletal-No deformities, no cyanosis or clubbing: Neurologic-Normal cranial nerves; symmetric strength and tone:  Skin-Warm, circular area of skin ulceration measuring approximately  2 cm in diameter with erythematous moist plaque. Extremities: 1-2+ distal pulses; 1+anklel edema:     Impression & Recommendations:  Problem # 1:  CARDIOMYOPATHY (ICD-425.4) Patient has been asymptomatic with respect to his cardiac disease for quite a few years.  While there is a reasonable possibility that systolic function has recovered, this would make no difference in his current treatment.  Accordingly, no imaging study will be obtained.  Problem # 2:  ATRIAL FIBRILLATION, CHRONIC (ICD-427.31) Heart rate control is good.  His dose of digoxin has been decreased in conjunction with progressive renal insufficiency.  A digoxin level will be obtained.  In light of continuing anticoagulation, a CBC and stool for Hemoccult testing will be obtained.  Problem # 3:  HYPERLIPIDEMIA (ICD-272.4) Lipids were excellent a year ago on a modest dose of simvastatin.  A repeat lipid profile will be obtained.  Problem # 4:  CHRONIC KIDNEY DISEASE-STAGE IV (ICD-585.9) Renal disease is slowly progressive.  [name] is not interested in renal consultation, or in fact, in seeing any additional specialists.  His recent chemistry profile shows borderline hyperkalemia.  A potassium restriction will be added to his diet and explained to him.  A chemistry profile will be obtained in 2 months.  Problem # 5:  ABDOMINAL AORTIC ANEURYSM (ICD-441.4) Dr. Edilia Orr continues to follow [name] vascular disease.  We reminded [name] that he is due for a visit this month.  Of note, hydronephrosis was noted on a CT scan in September of 2010.  I will leave it to Dr. Gerda Orr to verify that appropriate further evaluation has been carried out.  Problem # 6:  Skin lesion Although the nonhealing ulceration over the left anterior lower leg is apparently located in the region where cellulitis was treated just prior to the development of this problem, the patient denies any skin lesions at that time.  If this did in fact start with  ulceration, it may represent excessive granulation tissue and required debridement.  Alternatively, benign and neoplastic proliferative lesion should be considered.  Dr. Gerda Orr is scheduled to reassess Mr. Talent in the near future and can consider surgical or dermatologic consultation at that time.  With respect to Mr. Cerrone ENT issues, I suggested that he secure a bulb syringe and attempt to maintain his ear canals free of cerumen.  I will plan to see this nicegentleman again in one year.  Other Orders: Future Orders: T-Digoxin ([id number]) ... 04/03/2010 T-Lipid Profile [id number]) ... 04/03/2010 T-Comprehensive Metabolic Panel [id number]) ... 04/03/2010 T-CBC w/Diff ([id number]) ... 04/03/2010  Patient Instructions: 1)  Your physician recommends that you schedule a follow-up appointment in: 1 year 2)  Your physician recommends that you return for lab work in: 2 months 3)  Your physician recommends that you continue on your current medications as directed. Please refer to the Current Medication list given to you today.

## 2010-05-26 NOTE — Medication Information (Signed)
Summary: PROTIME/TG  Anticoagulant Therapy  Managed by: Vashti Hey, RN PCP: Dr. Lubertha South Supervising MD: Dietrich Pates MD, Molly Maduro Indication 1: Atrial Fibrillation (ICD-427.31) Lab Used: [facility] [place] Site: [place] INR POC 2.3  Dietary changes: no    Health status changes: yes       Details: Has cellulitis in Lt leg  Bleeding/hemorrhagic complications: no    Recent/future hospitalizations: no    Any changes in medication regimen? yes       Details: has been  on abx x 10 days   Has 2 days left  Recent/future dental: no  Any missed doses?: no       Is patient compliant with meds? yes       Allergies: No Known Drug Allergies  Anticoagulation Management History:      The patient is taking warfarin and comes in today for a routine follow up visit.  Positive risk factors for bleeding include an age of 23 years or older and presence of serious comorbidities.  The bleeding index is 'intermediate risk'.  Positive CHADS2 values include History of HTN and Age > 49 years old.  The start date was 10/14/1997.  His last INR was 1.0.  Anticoagulation responsible provider: Dietrich Pates MD, Molly Maduro.  INR POC: 2.3.  Cuvette Lot#: [id number].  Exp: 10/11.    Anticoagulation Management Assessment/Plan:      The patient's current anticoagulation dose is Coumadin 5 mg tabs: take as directed by coumadin clinic.  The target INR is 2 - 3.  The next INR is due 06/18/2009.  Anticoagulation instructions were given to patient.  Results were reviewed/authorized by Vashti Hey, RN.  He was notified by Vashti Hey RN.         Prior Anticoagulation Instructions: INR 2.3 Continue coumadin 2.5mg  once daily   Current Anticoagulation Instructions: Same as Prior Instructions.

## 2010-05-26 NOTE — Medication Information (Signed)
Summary: ccr-lr  Anticoagulant Therapy  Managed by: Vashti Hey, RN PCP: Dr. Lubertha South Supervising MD: Dietrich Pates MD, Molly Maduro Indication 1: Atrial Fibrillation (ICD-427.31) Lab Used: [facility]  Site: [place] INR POC 2.3  Dietary changes: no    Health status changes: no    Bleeding/hemorrhagic complications: no    Recent/future hospitalizations: no    Any changes in medication regimen? no    Recent/future dental: no  Any missed doses?: no       Is patient compliant with meds? yes       Allergies: No Known Drug Allergies  Anticoagulation Management History:      The patient is taking warfarin and comes in today for a routine follow up visit.  Positive risk factors for bleeding include an age of 75 years or older and presence of serious comorbidities.  The bleeding index is 'intermediate risk'.  Positive CHADS2 values include History of HTN and Age > 75 years old.  The start date was 10/14/1997.  His last INR was 1.0.  Anticoagulation responsible provider: Dietrich Pates MD, Molly Maduro.  INR POC: 2.3.  Cuvette Lot#: [id number].  Exp: 10/11.    Anticoagulation Management Assessment/Plan:      The patient's current anticoagulation dose is Coumadin 5 mg tabs: take as directed by coumadin clinic.  The target INR is 2 - 3.  The next INR is due 10/09/2009.  Anticoagulation instructions were given to patient.  Results were reviewed/authorized by Vashti Hey, RN.  He was notified by Vashti Hey RN.         Prior Anticoagulation Instructions: INR 2.0 Take coumadin 5mg  tonight then resume 2.5mg  once daily   Current Anticoagulation Instructions: INR 2.3 Continue coumadin 2.5mg  once daily

## 2010-05-26 NOTE — Letter (Signed)
Summary: [facility], [facility] at [employer]  [address], [place] [address]   Phone: [phone]  Fax: [phone]     February 02, 2010 MRN: [id number]   KIETH HARTIS [address] [place], [place]  [address]      YOUR LAB WORK IS DUE   April 03, 2010 Please go to [facility], located across the street from [facility] on the second floor.  Hours are Monday - Friday 7am until 7:30pm         Saturday 8am until 12noon    _X_  DO NOT EAT OR DRINK AFTER MIDNIGHT EVENING PRIOR TO LABWORK

## 2010-05-26 NOTE — Medication Information (Signed)
Summary: ccr-lr  Anticoagulant Therapy  Managed by: Vashti Hey, RN PCP: Dr. Lubertha South Supervising MD: Dietrich Pates MD, Molly Maduro Indication 1: Atrial Fibrillation (ICD-427.31) Lab Used: [facility] [place] Site: [place] INR POC 2.1  Dietary changes: no    Health status changes: no    Bleeding/hemorrhagic complications: no    Recent/future hospitalizations: no    Any changes in medication regimen? no    Recent/future dental: no  Any missed doses?: no       Is patient compliant with meds? yes       Allergies: No Known Drug Allergies  Anticoagulation Management History:      The patient is taking warfarin and comes in today for a routine follow up visit.  Positive risk factors for bleeding include an age of 75 years or older and presence of serious comorbidities.  The bleeding index is 'intermediate risk'.  Positive CHADS2 values include History of HTN and Age > 71 years old.  The start date was 10/14/1997.  His last INR was 1.0.  Anticoagulation responsible provider: Dietrich Pates MD, Molly Maduro.  INR POC: 2.1.  Cuvette Lot#: [id number].  Exp: 10/11.    Anticoagulation Management Assessment/Plan:      The patient's current anticoagulation dose is Coumadin 5 mg tabs: take as directed by coumadin clinic.  The target INR is 2 - 3.  The next INR is due 07/17/2009.  Anticoagulation instructions were given to patient.  Results were reviewed/authorized by Vashti Hey, RN.  He was notified by Vashti Hey RN.         Prior Anticoagulation Instructions: INR 2.3 Continue coumadin 2.5mg  once daily   Current Anticoagulation Instructions: INR 2.1 Continue coumadin 2.5mg  once daily

## 2010-05-26 NOTE — Medication Information (Signed)
Summary: ccr-lr  Anticoagulant Therapy  Managed by: Vashti Hey, RN PCP: Dr. Lubertha South Supervising MD: Daleen Squibb MD, Maisie Fus Indication 1: Atrial Fibrillation (ICD-427.31) Lab Used: [facility] [place] Site: [place] INR POC 2.6  Dietary changes: no    Health status changes: no    Bleeding/hemorrhagic complications: no    Recent/future hospitalizations: no    Any changes in medication regimen? no    Recent/future dental: no  Any missed doses?: no       Is patient compliant with meds? yes       Allergies: No Known Drug Allergies  Anticoagulation Management History:      The patient is taking warfarin and comes in today for a routine follow up visit.  Positive risk factors for bleeding include an age of 75 years or older and presence of serious comorbidities.  The bleeding index is 'intermediate risk'.  Positive CHADS2 values include History of HTN and Age > 9 years old.  The start date was 10/14/1997.  His last INR was 1.0.  Anticoagulation responsible provider: Daleen Squibb MD, Maisie Fus.  INR POC: 2.6.  Cuvette Lot#: [id number].  Exp: 10/11.    Anticoagulation Management Assessment/Plan:      The patient's current anticoagulation dose is Coumadin 5 mg tabs: take as directed by coumadin clinic.  The target INR is 2 - 3.  The next INR is due 04/30/2010.  Anticoagulation instructions were given to patient.  Results were reviewed/authorized by Vashti Hey, RN.  He was notified by Vashti Hey RN.         Prior Anticoagulation Instructions: INR 2.1 Continue coumadin 2.5mg  once daily except 5mg  on Wednesdays  Current Anticoagulation Instructions: INR 2.6 Continue coumadin 2.5mg  once daily except 5mg  on Wednesdays

## 2010-05-28 ENCOUNTER — Encounter (INDEPENDENT_AMBULATORY_CARE_PROVIDER_SITE_OTHER): Payer: Medicare Other

## 2010-05-28 ENCOUNTER — Ambulatory Visit: Admit: 2010-05-28 | Payer: Self-pay

## 2010-05-28 ENCOUNTER — Encounter: Payer: Self-pay | Admitting: Cardiology

## 2010-05-28 DIAGNOSIS — Z7901 Long term (current) use of anticoagulants: Secondary | ICD-10-CM

## 2010-05-28 DIAGNOSIS — I4891 Unspecified atrial fibrillation: Secondary | ICD-10-CM

## 2010-05-28 LAB — CONVERTED CEMR LAB: POC INR: 3.5

## 2010-05-28 NOTE — Medication Information (Signed)
Summary: ccr-lr  Anticoagulant Therapy  Managed by: Vashti Hey, RN PCP: Dr. Lubertha South Supervising MD: Dietrich Pates MD, Molly Maduro Indication 1: Atrial Fibrillation (ICD-427.31) Lab Used: [facility] [place] Site: [place] INR POC 3.2  Dietary changes: no    Health status changes: no    Bleeding/hemorrhagic complications: no    Recent/future hospitalizations: no    Any changes in medication regimen? no    Recent/future dental: no  Any missed doses?: no       Is patient compliant with meds? yes       Allergies: No Known Drug Allergies  Anticoagulation Management History:      The patient is taking warfarin and comes in today for a routine follow up visit.  Positive risk factors for bleeding include an age of 75 years or older and presence of serious comorbidities.  The bleeding index is 'intermediate risk'.  Positive CHADS2 values include History of HTN and Age > 75 years old.  The start date was 10/14/1997.  His last INR was 1.0.  Anticoagulation responsible provider: Dietrich Pates MD, Molly Maduro.  INR POC: 3.2.  Cuvette Lot#: [id number].  Exp: 10/11.    Anticoagulation Management Assessment/Plan:      The patient's current anticoagulation dose is Coumadin 5 mg tabs: take as directed by coumadin clinic.  The target INR is 2 - 3.  The next INR is due 05/28/2010.  Anticoagulation instructions were given to patient.  Results were reviewed/authorized by Vashti Hey, RN.  He was notified by Vashti Hey RN.         Prior Anticoagulation Instructions: INR 2.6 Continue coumadin 2.5mg  once daily except 5mg  on Wednesdays  Current Anticoagulation Instructions: INR 3.2 Hold coumadin tonight then resume 2.5mg  once daily except 5mg  on Wednesdays

## 2010-06-03 NOTE — Medication Information (Signed)
Summary: ccr-lr  Anticoagulant Therapy  Managed by: Vashti Hey, RN PCP: Dr. Lubertha South Supervising MD: Dietrich Pates MD, Molly Maduro Indication 1: Atrial Fibrillation (ICD-427.31) Lab Used: [facility] [place] Site: [place] INR POC 3.5  Dietary changes: no    Health status changes: no    Bleeding/hemorrhagic complications: no    Recent/future hospitalizations: no    Any changes in medication regimen? no    Recent/future dental: no  Any missed doses?: no       Is patient compliant with meds? yes       Allergies: No Known Drug Allergies  Anticoagulation Management History:      The patient is taking warfarin and comes in today for a routine follow up visit.  Positive risk factors for bleeding include an age of 75 years or older and presence of serious comorbidities.  The bleeding index is 'intermediate risk'.  Positive CHADS2 values include History of HTN and Age > 75 years old.  The start date was 10/14/1997.  His last INR was 1.0.  Anticoagulation responsible provider: Dietrich Pates MD, Molly Maduro.  INR POC: 3.5.  Cuvette Lot#: [id number].  Exp: 10/11.    Anticoagulation Management Assessment/Plan:      The patient's current anticoagulation dose is Coumadin 5 mg tabs: take as directed by coumadin clinic.  The target INR is 2 - 3.  The next INR is due 06/25/2010.  Anticoagulation instructions were given to patient.  Results were reviewed/authorized by Vashti Hey, RN.  He was notified by Vashti Hey RN.         Prior Anticoagulation Instructions: INR 3.2 Hold coumadin tonight then resume 2.5mg  once daily except 5mg  on Wednesdays  Current Anticoagulation Instructions: INR 3.5 Decrease coumadin to 1/2 tablet once daily

## 2010-06-18 ENCOUNTER — Other Ambulatory Visit: Payer: Self-pay | Admitting: Ophthalmology

## 2010-06-18 ENCOUNTER — Encounter (HOSPITAL_COMMUNITY): Payer: Medicare Other

## 2010-06-18 DIAGNOSIS — Z01810 Encounter for preprocedural cardiovascular examination: Secondary | ICD-10-CM | POA: Insufficient documentation

## 2010-06-18 DIAGNOSIS — Z01812 Encounter for preprocedural laboratory examination: Secondary | ICD-10-CM | POA: Insufficient documentation

## 2010-06-18 LAB — BASIC METABOLIC PANEL
Calcium: 9 mg/dL (ref 8.4–10.5)
Creatinine, Ser: 2.37 mg/dL — ABNORMAL HIGH (ref 0.4–1.5)
GFR calc Af Amer: 32 mL/min — ABNORMAL LOW (ref >60)
Sodium: 138 mEq/L (ref 135–145)

## 2010-06-18 LAB — HEMOGLOBIN AND HEMATOCRIT, BLOOD: HCT: 38.8 % — ABNORMAL LOW (ref 39.0–52.0)

## 2010-06-22 ENCOUNTER — Ambulatory Visit (HOSPITAL_COMMUNITY)
Admission: RE | Admit: 2010-06-22 | Discharge: 2010-06-22 | Disposition: A | Discharge status: Home / Self Care | Payer: Medicare Other | Source: Ambulatory Visit | Attending: Ophthalmology | Admitting: Ophthalmology

## 2010-06-22 DIAGNOSIS — Z7901 Long term (current) use of anticoagulants: Secondary | ICD-10-CM | POA: Insufficient documentation

## 2010-06-22 DIAGNOSIS — I10 Essential (primary) hypertension: Secondary | ICD-10-CM | POA: Insufficient documentation

## 2010-06-22 DIAGNOSIS — Z79899 Other long term (current) drug therapy: Secondary | ICD-10-CM | POA: Insufficient documentation

## 2010-06-22 DIAGNOSIS — Z01812 Encounter for preprocedural laboratory examination: Secondary | ICD-10-CM | POA: Insufficient documentation

## 2010-06-22 DIAGNOSIS — H2510 Age-related nuclear cataract, unspecified eye: Secondary | ICD-10-CM | POA: Insufficient documentation

## 2010-06-25 ENCOUNTER — Encounter: Payer: Self-pay | Admitting: Cardiology

## 2010-06-25 ENCOUNTER — Encounter (INDEPENDENT_AMBULATORY_CARE_PROVIDER_SITE_OTHER): Payer: Medicare Other

## 2010-06-25 DIAGNOSIS — I4891 Unspecified atrial fibrillation: Secondary | ICD-10-CM

## 2010-06-25 DIAGNOSIS — Z7901 Long term (current) use of anticoagulants: Secondary | ICD-10-CM

## 2010-06-25 LAB — CONVERTED CEMR LAB: POC INR: 2.9

## 2010-07-02 NOTE — Medication Information (Signed)
Summary: ccr-lr  Anticoagulant Therapy  Managed by: Vashti Hey, RN PCP: Dr. Lubertha South Supervising MD: Diona Browner MD, Remi Deter Indication 1: Atrial Fibrillation (ICD-427.31) Lab Used: [facility] [place] Site: [place] INR POC 2.9  Dietary changes: no    Health status changes: no    Bleeding/hemorrhagic complications: no    Recent/future hospitalizations: no    Any changes in medication regimen? yes       Details: Been on Z-pack  Recent/future dental: no  Any missed doses?: no       Is patient compliant with meds? yes       Allergies: No Known Drug Allergies  Anticoagulation Management History:      The patient is taking warfarin and comes in today for a routine follow up visit.  Positive risk factors for bleeding include an age of 75 years or older and presence of serious comorbidities.  The bleeding index is 'intermediate risk'.  Positive CHADS2 values include History of HTN and Age > 42 years old.  The start date was 10/14/1997.  His last INR was 1.0.  Anticoagulation responsible provider: Diona Browner MD, Remi Deter.  INR POC: 2.9.  Cuvette Lot#: [id number].  Exp: 10/11.    Anticoagulation Management Assessment/Plan:      The patient's current anticoagulation dose is Coumadin 5 mg tabs: take as directed by coumadin clinic.  The target INR is 2 - 3.  The next INR is due 07/23/2010.  Anticoagulation instructions were given to patient.  Results were reviewed/authorized by Vashti Hey, RN.  He was notified by Vashti Hey RN.         Prior Anticoagulation Instructions: INR 3.5 Decrease coumadin to 1/2 tablet once daily   Current Anticoagulation Instructions: INR 2.9 Continue coumadin 2.5mg  once daily

## 2010-07-23 ENCOUNTER — Ambulatory Visit (INDEPENDENT_AMBULATORY_CARE_PROVIDER_SITE_OTHER): Payer: Medicare Other | Admitting: *Deleted

## 2010-07-23 DIAGNOSIS — Z7901 Long term (current) use of anticoagulants: Secondary | ICD-10-CM

## 2010-07-23 DIAGNOSIS — I4891 Unspecified atrial fibrillation: Secondary | ICD-10-CM

## 2010-07-23 LAB — POCT INR: INR: 2

## 2010-07-29 ENCOUNTER — Encounter (HOSPITAL_COMMUNITY): Payer: Medicare Other

## 2010-08-03 ENCOUNTER — Ambulatory Visit (HOSPITAL_COMMUNITY)
Admission: RE | Admit: 2010-08-03 | Discharge: 2010-08-03 | Disposition: A | Discharge status: Home / Self Care | Payer: Medicare Other | Source: Ambulatory Visit | Attending: Ophthalmology | Admitting: Ophthalmology

## 2010-08-03 DIAGNOSIS — I4891 Unspecified atrial fibrillation: Secondary | ICD-10-CM | POA: Insufficient documentation

## 2010-08-03 DIAGNOSIS — I10 Essential (primary) hypertension: Secondary | ICD-10-CM | POA: Insufficient documentation

## 2010-08-03 DIAGNOSIS — Z7901 Long term (current) use of anticoagulants: Secondary | ICD-10-CM | POA: Insufficient documentation

## 2010-08-03 DIAGNOSIS — Z79899 Other long term (current) drug therapy: Secondary | ICD-10-CM | POA: Insufficient documentation

## 2010-08-03 DIAGNOSIS — H2510 Age-related nuclear cataract, unspecified eye: Secondary | ICD-10-CM | POA: Insufficient documentation

## 2010-08-20 ENCOUNTER — Ambulatory Visit (INDEPENDENT_AMBULATORY_CARE_PROVIDER_SITE_OTHER): Payer: Medicare Other | Admitting: *Deleted

## 2010-08-20 DIAGNOSIS — I4891 Unspecified atrial fibrillation: Secondary | ICD-10-CM

## 2010-08-20 DIAGNOSIS — Z7901 Long term (current) use of anticoagulants: Secondary | ICD-10-CM

## 2010-09-02 ENCOUNTER — Encounter: Payer: Self-pay | Admitting: Cardiology

## 2010-09-02 ENCOUNTER — Other Ambulatory Visit: Payer: Self-pay | Admitting: Cardiology

## 2010-09-08 NOTE — Letter (Signed)
October 21, 2006    W. Simone Curia, M.D.  [address]  [place], [place] [address]   RE:  NAWAF, STRANGE  MRN:  [id number]  /  DOB:  Nov 14, 1925   Dear Brett Canales:   Mr. Cefalu returns to the office as scheduled for continued assessment  and treatment of multiple cardiac and vascular issues as well as atrial  fibrillation requiring anticoagulation. Since I last saw him, he reports  that he has done fine. He has not had any emergency department visits  nor hospitalizations. His abdominal aortic aneurysm continues to be  followed by Dr. Edilia Bo as well as his other vascular issues. Blood  pressure control has apparently been good. He has no dyspnea, chest  pain, orthopnea nor PND and has only minimal pedal edema.   CURRENT MEDICATIONS:  1. Warfarin as directed with generally stable and therapeutic      anticoagulation.  2. Furosemide 20 mg daily.  3. Metoprolol 100 mg b.i.d.  4. Losartan 50 mg daily.  5. Digoxin 0.125 mg daily.  6. Caduet 5/20 mg daily.  7. Vitamin B12.   PHYSICAL EXAMINATION:  GENERAL:  A pleasant gentleman in no acute  distress.  VITAL SIGNS:  The weight is 240, 1 pound less than last year. Blood  pressure 135/70, heart rate 60 and irregular, respirations 16.  NECK:  No jugular venous distention; normal carotid upstrokes without  bruits.  LUNGS:  Clear.  CARDIAC:  Irregular rhythm; normal first and second heart sounds; grade  1-2/6 basilar systolic ejection murmur.  ABDOMEN:  Soft and nontender. Aortic pulsation not palpable; no  organomegaly; no bruits.  EXTREMITIES:  1/2+ ankle edema; distal pulses intact.   LABORATORY DATA:  Laboratory from your office shows normal electrolytes,  stable renal function with a creatinine of 2.0 and a good lipid profile.  Hepatic profile was also normal.   IMPRESSION:  Mr. Meckes is doing quite well considering his history of a  ruptured thoracic aortic aneurysm, his chronic atrial arrhythmias, his  cardiomyopathy with  congestive heart failure, etc. A CT scan done in  February showed no progression of his aortic aneurysm. This has been the  case since 2005. Hopefully, he will never need intervention for this.  Due to his chronic anticoagulation, we will check a CBC and stool for  Hemoccult testing. If results are good, I will plan to see this nice  gentleman again in one year.    Sincerely,      Gerrit Friends. Dietrich Pates, MD, [facility]  Electronically Signed    RMR/MedQ  DD: 10/21/2006  DT: 10/21/2006  Job #: [id number]

## 2010-09-08 NOTE — Assessment & Plan Note (Signed)
OFFICE VISIT   [name], [name]  DOB:  12/08/1925                                       02/18/2010  UEAVW#:[id number]   I saw the patient in the office today for continued followup of his  aneurysmal disease.  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  Dr. Samule Ohm  had embolized the hypogastric artery aneurysm prior to this.  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  He comes in for a  yearly followup visit.  Since I saw him last he has had no significant  abdominal or back pain.  Unfortunately, he lost his wife in January.  He  has also lost 2 of his children prior to this.   PAST MEDICAL HISTORY:  Significant for hypertension, which has been  stable on his current medications.  He also has a history of  hyperlipidemia, congestive heart failure and nephrolithiasis.  He had  also had a previous repair of a ruptured thoracic aneurysm at [facility] in 1996.   SOCIAL HISTORY:  He is retired.  He is widowed.  He does not smoke  cigarettes.   REVIEW OF SYSTEMS:  CARDIOVASCULAR:  He has had no chest pain, chest  pressure, palpitations or arrhythmias.  He has had no history of stroke  or TIA.  He has had no history of DVT or phlebitis.  MUSCULOSKELETAL:  He does have a history of arthritis.  PULMONARY:  He has had no productive cough, bronchitis, asthma or  wheezing.   PHYSICAL EXAMINATION:  This is a pleasant 77 year old gentleman who  appears his stated age.  Blood pressure is 121/70, heart rate is 74,  respiratory rate is 24.  Lungs are clear bilaterally to auscultation  without rales, rhonchi or wheezing.  On cardiovascular exam I do not  detect any carotid bruits.  Has a regular rate and rhythm.  He has  palpable femoral pulses and warm, well-perfused feet without ischemic  ulcers.  He has mild  bilateral lower extremity swelling.  His abdomen is  soft and nontender.  Neurologic exam:  He has no focal weakness or  paresthesias.   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  This stent is in good position.   I reassured him that everything remains stable and I think for this  reason we will continue his followup at 1 year.  I have ordered a  followup CT scan in 1 year and I will see him back at that time.  He  knows to call sooner if he has problems.     [name]. Edilia Bo, M.D.  Electronically Signed   CSD/MEDQ  D:  02/18/2010  T:  02/19/2010  Job:  [id number]   cc:   Dr. Gerda Diss

## 2010-09-08 NOTE — Assessment & Plan Note (Signed)
OFFICE VISIT   [name], [name]  DOB:  [birth year]                                       01/16/2009  EAVWU#:[id number]   I saw the patient in the office today for continued followup of his  right hypogastric artery aneurysm.  He underwent endovascular repair of  this in April of 2005 with an AneuRx graft and so I have been following  him postoperatively after his endovascular repair.  He also has a known  ectasia of the juxtarenal aorta.  He comes in for a yearly followup  visit.   Since I saw him last he has had no significant abdominal pain.  He does  have some low back pain likely related to lifting.  He has to take care  of his wife who is quite debilitated.   REVIEW OF SYSTEMS:  He has had no recent chest pain, chest pressure,  palpitations or arrhythmias.  He has had no productive cough,  bronchitis, asthma or wheezing.  He does not use tobacco.   PHYSICAL EXAMINATION:  General:  This is a pleasant 77 year old  gentleman who appears his stated age.  Vital signs:  His blood pressure  is 130/81, heart rate is 60.  Neck:  I do not detect any carotid bruits.  Lungs:  Are clear bilaterally to auscultation.  Cardiac:  He has a  regular rate and rhythm.  Abdomen:  Soft and nontender.  His aorta is  palpable and nontender.  He is moderately obese.  He has palpable  femoral pulses and palpable pedal pulses.  He has mild bilateral lower  extremity swelling.   I did review his CT scan which shows that his right hypogastric artery  aneurysm has not changed in size and still measures 4.8 cm in maximum  diameter.  Likewise the ectasia at the juxtarenal aorta has not changed  in size.  I think it is safe to continue with 1 year followup studies  and I will see him back in 1 year with a followup CT scan.  Of note, he  does have significant hydronephrosis seen on his CAT scan which has been  stable although is fairly impressive.  He had been scheduled to see the  nephrologist but is going to have to reschedule that appointment.  I  plan on seeing him back in 1 year.  He knows to call sooner if he has  problems.   [name]. Edilia Bo, M.D.  Electronically Signed   CSD/MEDQ  D:  01/16/2009  T:  01/17/2009  Job:  [id number]   cc:   Lorin Picket A. Gerda Diss, MD

## 2010-09-08 NOTE — Assessment & Plan Note (Signed)
OFFICE VISIT   [name], [name]  DOB:  1925/10/21                                       01/02/2008  EAVWU#:[id number]   I saw the patient for continued followup of his right hypogastric artery  aneurysm and also aneurysmal aorta.  I had performed an endovascular  repair of a right hypogastric artery aneurysm in April of 2005 with an  AneuRx graft.  This was for a 5 cm right hypogastric artery aneurysm.  I  have been following him from the standpoint of his endovascular repair.  He also had some known ectasia of the juxtarenal aorta.  He comes in for  a yearly followup CAT scan.   Since I saw him last in February of 2008 he has had no significant  abdominal or back pain.  He is under a large amount of stress because of  the loss of two of his children and also his wife has end-stage COPD.   REVIEW OF SYSTEMS:  On review of systems he has had no recent chest  pain, chest pressure, palpitations or arrhythmias.  He has had no  bronchitis, asthma or wheezing.   PHYSICAL EXAMINATION:  On physical examination this is a pleasant 77-  year-old gentleman who appears his stated age.  Blood pressure is  145/75, heart rate is 85.  Lungs are clear bilaterally to auscultation.  On cardiac exam he has a regular rate and rhythm.  His abdomen is soft  and nontender.  He has normal pitched bowel sounds.  He has palpable  femoral, popliteal and posterior tibial pulses bilaterally.   I did review his CAT scan.  This shows stable juxtarenal aneurysm at 4.8  cm.  Likewise the right internal iliac artery aneurysm is stable at 4.8  cm.  The patient's stent in the right iliac system is patent.  He had a  16 x 8.5 iliac graft and a 24 x 3.75 aortic cuff proximally.   I have reassured him that there has been no significant change in the  right hypogastric artery aneurysm or the juxtarenal component of his  aneurysm.  I think it is safe to continue his followup at 1 year.  I  will  see him back at that time.  He knows to call sooner if he has  problems.   [name]. Edilia Bo, M.Orr.  Electronically Signed   CSD/MEDQ  Orr:  01/02/2008  T:  01/03/2008  Job:  [id number]

## 2010-09-08 NOTE — Letter (Signed)
February 05, 2008    W. [name], M.D.  [address]  [place], [place] [address]   RE:  [name], [name]  MRN:  [id number]  /  DOB:  [birth year]   Dear [name]:   [name] returns to the office for continued assessment and treatment  of hypertension, cardiomyopathy and chronic atrial fibrillation.  Since  I last saw him more than a year ago, he has done quite well.  He remains  active without any cardiopulmonary symptoms.  He is caring for his wife,  who has advanced COPD.  He continues to be followed by [name] for  vascular disease having previously undergone percutaneous intervention  for right iliac artery aneurysm and being watched for stable abdominal  aortic aneurysm.  He notes no palpitations.  Anticoagulation has been  stable and therapeutic.  Blood pressure control has been good.  He is  concerned about the expense of Cozaar 50 mg daily.   His other medications include warfarin, furosemide 40 mg daily,  metoprolol 100 mg b.i.d., digoxin 0.125 mg daily, vitamin B12,  amlodipine 5 mg daily and simvastatin 20 mg daily.   PHYSICAL EXAMINATION:  GENERAL:  On exam, pleasant gentleman who is hard  of hearing and in no acute distress.  VITAL SIGNS:  The weight is 226, 4 pounds less than in June 2008.  Blood  pressure 130/75, heart rate 70 and irregular, respirations 14.  HEENT:  Moderately impaired hearing acuity on the left; to need for  collateralizes to the right; substantial cerumen in the left ear canal,  but not impacted.  NECK:  No jugular venous distention; no carotid bruits.  LUNGS:  Clear.  CARDIAC:  Normal first and second heart sounds; grade 2/6 systolic  decrescendo murmur heard across the precordium, best at the left sternal  border and apex.  ABDOMEN:  Soft and nontender; aortic pulsation not palpable; no bruits.  EXTREMITIES:  1/2+ ankle edema.   LABORATORY DATA:  Most recent chemistry profile was in May.  Electrolytes were normal.  Creatinine was  2.0, which is stable compared  to 2008.   IMPRESSION:  [name] is doing well from a cardiac standpoint.  Blood  pressure control is excellent.  His heart rate is controlled in atrial  fibrillation with adequate anticoagulation.  I am somewhat concerned  about treatment with digoxin in the setting of a creatinine of 2.0, but  his present dose should be appropriate.  We will change Cozaar to  lisinopril 20 mg daily and check a subsequent metabolic profile and  digoxin level.  If results are good, I will plan to see this nice  gentleman again in 1 year.  Lipids were assessed last year and were  adequately controlled.  He may wish to remove the cerumen from his ear  canals.  If problems persist, he may benefit from evaluation by an ENT  physician.    Sincerely,      Edward Friends. Dietrich Pates, MD, [facility]  Electronically Signed    RMR/MedQ  DD: 02/05/2008  DT: 02/06/2008  Job #: [id number]

## 2010-09-11 NOTE — H&P (Signed)
NAME:  [name], [name]                            ACCOUNT NO.:  [id number]   MEDICAL RECORD NO.:  [id number]                   PATIENT TYPE:  INP   LOCATION:  NA                                   FACILITY:  [facility]   PHYSICIAN:  [name]. Edilia Bo, M.D.        DATE OF BIRTH:  Sep 02, 1925   DATE OF ADMISSION:  08/12/2003  DATE OF DISCHARGE:                                HISTORY & PHYSICAL   CHIEF COMPLAINT:  I have an aneurism.   HISTORY OF PRESENT ILLNESS:  The patient is a 77 year old white male who was  initially referred to Dr. Edilia Bo by Dr. Samule Ohm for evaluation of a right  hypogastric artery aneurism.  In January 2005, the patient was admitted at  [facility] with acute cholecystitis and during his workup underwent  a CT of the abdomen which incidentally showed a 4.9-cm right hypogastric  artery aneurism.  He subsequently underwent cholecystectomy and recovered  well.  He was seen by Dr. Waverly Ferrari and his films were reviewed.  His CT scan showed a 3.9-cm infrarenal abdominal aortic aneurism with a  right common iliac artery aneurism of 4.9-cm with mural plaque noted.  There  was marked tortuosity of the iliac artery systems bilaterally.  There was  also a 2-cm aneurism of the right popliteal artery.  He was also noted to  have marked bilateral hydronephrosis secondary to ureteropelvic junction  stones.  He has had no symptoms which can be referred to this including no  abdominal pain or back pain.  It was Dr. Adele Dan opinion that the aneurism  should be repaired at this time to decrease his risk of rupture, and given  his age and history of cardiomyopathy, felt that an endovascular approach  would be the safest way to address this problem.  He underwent an  arteriogram, by Dr. Dietrich Pates, and branch vessels of the aneurism were coil  embolized.  He tolerated the procedure well and has continued to remain  asymptomatic.  At this time, he is brought in for  outpatient admission to  proceed with stent graft repair of his aneurism.   PAST MEDICAL HISTORY:  1. Hypertension.  2. Hyperlipidemia.  3. Paroxysmal atrial fibrillation.  4. History of congestive heart failure.  5. History of ischemic cardiomyopathy.  6. History of a ruptured thoracic aortic aneurism in 1996.  7. History of nephrolithiasis.  8. 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   PAST SURGICAL HISTORY:  1. Surgical removal of right kidney stones x 2 in the 1980s.  2. Repair of a ruptured thoracic aortic aneurism at [facility] in 1996.  3. Laparoscopic cholecystectomy, in January 2005, at [facility].  4. Coil embolization of he iliac artery aneurism, in March 2005, by Dr.     Dietrich Pates.   CURRENT MEDICATIONS:  1. Lipitor 20 mg every day.  2. Norvasc 5 mg every  day.  3. Coumadin 2.5 mg every day, last taken on August 04, 2003.  4. Cozaar 50 mg every day.  5. Metoprolol 100 mg b.i.d.  6. Furosemide 20 mg p.r.n. for swelling.  7. Metamucil q.h.s.   ALLERGIES:  No known drug allergies.   FAMILY HISTORY:  His mother died at age 39 of coronary artery disease.  His  father died of complications following cholecystectomy.  He has one brother  who is living and has had an abdominal aortic aneurism repaired by Dr. Balinda Quails recently.  He has one sister who died at age 35 with kidney  disease.  He has one sister who is alive and well with diabetes mellitus,  and one sister who has no chronic medical problems.   SOCIAL HISTORY:  He is married and has one living child.  He has one child  who died in a motor vehicle accident.  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  He denies alcohol or tobacco use either past or present.   REVIEW OF SYSTEMS:  See history of present illness for pertinent positives  and negatives.  He does report lower extremity edema and  takes Lasix when he  feels that he is swelling.  He has chronic aching pain in his right hip with  ambulation.  Initially this was thought to be related to his aneurism but  now it is felt to be more along the lines of sciatica.  He does have  problems with constipation and take Metamucil daily.  He has sinus problems.  He denies fevers, chills, weight loss, recent infections, TIA symptoms,  amaurosis fugax, visual changes, dysphagia, syncope, palpitations, chest  pain, shortness of breath, dyspnea on exertion, orthopnea, paroxysmal  nocturnal dyspnea, abdominal pain, nausea, vomiting, diarrhea, constipation,  reflux symptoms, hematuria, dysuria, nocturia, hematemesis, hematochezia,  melena, lower extremity claudication symptoms, or __________  pain,  depression, anxiety, intolerance to heat or cold.   PHYSICAL EXAMINATION:  VITAL SIGNS:  Blood pressure is 114/66, pulse is 52  and irregular, respirations 18 and unlabored.  GENERAL:  This is a well-developed, well-nourished, white male in no acute  distress.  HEENT:  Normocephalic, atraumatic.  Pupils equal, round, reactive to light  and accommodation.  Extraocular movements intact.  TMs and canals are clear.  Nares patent bilaterally.  Oropharynx is clear with moist mucous membranes.  NECK:  Supple without lymphadenopathy, thyromegaly, or carotid bruits.  HEART:  Irregularly irregular with no murmurs, rubs or gallops.  He has a  well healed median sternotomy scar.  LUNGS:  Clear to auscultation.  ABDOMEN:  Soft, obese, nontender, nondistended, with active bowel sounds in  all quadrants.  No masses or palpable aneurism.  EXTREMITIES:  No clubbing or cyanosis.  He does have trace lower extremity  edema bilaterally.  He has a well healed left groin incision from his  previous femoral cannulation during his thoracic aorta surgery.  He has 2+ femoral, dorsalis pedis, and posterior tibial pulses on the left as well as  a 2+ femoral and 1+  dorsalis pedis and posterior tibial pulses on the right.  NEUROLOGIC:  Cranial nerves II-XII grossly intact.  His gait is within  normal limits.  He is alert and oriented  x 3.   ASSESSMENT/PLAN:  This is a 77 year old white male with a right iliac artery  aneurism.  He will be admitted to [facility], on August 12, 2003,  and undergo AneuRx stent graft repair, performed by Dr. Waverly Ferrari.      Coral Ceo, P.A.                        [name]. Edilia Bo, M.D.    GC/MEDQ  D:  08/06/2003  T:  08/07/2003  Job:  [id number]   cc:   Lorin Picket A. Gerda Diss, M.D.  [address], [address]  [place]  [place] [address]  Fax: [phone]   Salvadore Farber, M.D. [facility]  [address]  [address]  [place]  [place] [address]   [place] Bing, M.D.

## 2010-09-11 NOTE — Discharge Summary (Signed)
[name], [name]                            ACCOUNT NO.:  [id number]   MEDICAL RECORD NO.:  [id number]                   PATIENT TYPE:  OIB   LOCATION:  6733                                 FACILITY:  [facility]   PHYSICIAN:  Salvadore Farber, M.D.             DATE OF BIRTH:  [birth year]   DATE OF ADMISSION:  07/16/2003  DATE OF DISCHARGE:  07/17/2003                                 DISCHARGE SUMMARY   PROCEDURE:  1. Abdominal aortogram.  2. Internal iliac branch angiogram.  3. Embolization of internal iliac artery branches.   HOSPITAL COURSE:  [name] is a 77 year old male with a history of ruptured  aortic aneurysm as well as cardiomyopathy with an EF of 30%, PAF on chronic  anticoagulation, and recent acute cholecystitis. In December 2004 while he  was being evaluated for abdominal pain, CT scan was performed that  demonstrated a 5.2 x 4.4 cm right internal iliac artery aneurysm. He was  evaluated by Dr. Samule Ohm and is seen by Dr. Dietrich Pates in preparation for  cholecystectomy. Dr. Samule Ohm evaluated him for the aneurysm but felt that  this should wait until after the cholecystectomy. He was admitted for  angiogram on July 16, 2003. The procedures performed were abdominal  aortogram, internal iliac branch angiogram, and embolization of internal  iliac artery branches. He will follow up with Dr. Edilia Bo of [facility] for stent  graft. This procedure went well, and he was hydrated overnight. The next  day, his vital signs were stable. His BUN was 16 with a creatinine of 1.6  and hematocrit of 40, and his heart rate was within normal limits. Dr.  Samule Ohm evaluated [name] and felt that he was stable for discharge on  July 17, 2003 with outpatient followup with both cardiology,  anticoagulation Coumadin clinic, and Dr. Edilia Bo.   LABORATORY DATA:  Hemoglobin 13.9, hematocrit 40.5, WBCs 8.5, platelets 191.  Sodium 138, potassium 3.9, chloride 107, CO2 26, BUN 16, creatinine 1.6,  glucose  108, calcium 9.0. Chest x-ray:  Preoperative evaluation, no  congestive heart failure or active disease. Prior median sternotomy noted.   DISCHARGE CONDITION:  Stable.   DISCHARGE DIAGNOSES:  1. Internal iliac artery aneurysm with successful embolization of both major     internal iliac artery branches and four small branches with two small     branches not embolized.  2. Cardiomyopathy with an ejection fraction of approximately 30%.  3. Atrial fibrillation.  4. Chronic anticoagulation.  5. History of cholecystitis status post cholecystectomy.  6. Status post ruptured thoracoabdominal aneurysm.  7. History of nephrolithiasis with prior surgery.  8. Hypertension.  9. Dyslipidemia.   DISCHARGE INSTRUCTIONS:  Activity level is to be limited for two days. He is  to stick to a low fat diet. He is to call the office with problems with the  catheterization site. He is go ahead and followup with Dr. Dietrich Pates  on March  29 at 1 p.m. and get a protime at that time. He is to followup with Dr.  Edilia Bo, and Dr. Adele Dan office will call. He is to followup with Dr.  Gerda Diss as needed.   DISCHARGE MEDICATIONS:  1. Lasix 20 mg q.d. p.r.n.  2. Cozaar 50 mg q.d.  3. Norvasc 10 mg q.d.  4. Metoprolol 100 mg b.i.d.  5. Lipitor 20 mg q.d.  6. Coumadin 2.5 mg q.d. for 6 days with 5 mg on Wednesdays. Coumadin is to     be resumed today.  7. Digoxin 0.125 mg q.d.      Theodore Demark, P.A. [facility]                  Salvadore Farber, M.D.    RB/MEDQ  D:  07/17/2003  T:  07/18/2003  Job:  [id number]   cc:   Branch Bing, M.D.   Gerda Diss, M.D.   [name]. Edilia Bo, M.D.  [address]  [place]  [place] [address]

## 2010-09-11 NOTE — Consult Note (Signed)
[name], [name]                            ACCOUNT NO.:  [id number]   MEDICAL RECORD NO.:  [id number]                   Orr TYPE:  INP   LOCATION:  2039                                 FACILITY:  [facility]   PHYSICIAN:  Veneda Melter, M.D.                   DATE OF BIRTH:  20-Jul-1925   DATE OF CONSULTATION:  08/14/2003  DATE OF DISCHARGE:  08/14/2003                                   CONSULTATION   CHIEF COMPLAINT:  Atrial fibrillation with rapid rate.   HISTORY:  [name] is a 77 year old white male who underwent endovascular  repair of right internal iliac aneurysm on August 12, 2003.  [name] has  a history of chronic atrial fibrillation on Coumadin therapy.  During  hospitalization he has had labile heart rates with a rate of up to 150 beats  per minute.  We were asked to assist in medical control.  [name] denies  any chest pain or shortness of breath.  He has not had any lower extremity  edema.  He has been ambulating following his procedure.  [name] was  started on IV Cardizem with control of initial high rates and has been under  100 beats per minute today.  His Lopressor has been resumed at Edward usual  proper dose.  However, he has not been placed back on his digoxin.   REVIEW OF SYMPTOMS:  He has some mild back discomfort.   PAST MEDICAL HISTORY:  1. Notable for abdominal aortic aneurysm treated at [facility] in 1996.  2. Hypertension.  3. Dyslipidemia.  4. Chronic atrial fibrillation.  5. Coumadin therapy, followed in [place].  6. Nephrolithiasis.  7. Ischemic cardiomyopathy; Cardiolite in December 2004 showing moderate LV     dysfunction.  __________ ischemia.  He has underwent percutaneous     embolization of internal iliac branches on July 16, 2003 by Dr. Samule Ohm.   ALLERGIES:  None.   MEDICATIONS:  1. Cozaar 50 mg q. day.  2. Norvasc 10 mg q. day.  3. Metoprolol 100 mg b.i.d.  4. Lipitor 20 mg q.h.s.  5. Digoxin 0.125 mg q. day.  6. Lasix  20 mg p.r.n.  7. Coumadin 2.5 mg as directed.   During hospitalization he has been on Zocor 40 mg q. day, Norvasc 5 mg q.  day, Cozaar 50 mg q. day, Lopressor 100 mg b.i.d., Lovenox, sodium, Coumadin  and IV Cardizem.   SOCIAL HISTORY:  [name] lives in [place] with his wife.  He is  retired and plays golf occasionally.  He has four grandchildren and one  daughter died in an accident.  History of tobacco use, none times 20 years.   FAMILY HISTORY:  Noncontributory.   PHYSICAL EXAMINATION:  GENERAL:  He is a well-developed, well-nourished  white male in no acute distress.  VITAL SIGNS:  Temperature 100.5; pulse 89, irregular; respirations  20; blood  pressure 168/96; O2 saturation 98% on room air.  NECK:  Supple.  No adenopathy, no bruits.  HEART:  Irregular rate.  No murmurs.  LUNGS:  Clear to auscultation.  ABDOMEN:  Soft and nontender.  EXTREMITIES:  No peripheral edema.  Her pulses are diminished bilaterally  and palpable.  Motor and sensory are grossly intact.   Chest x-ray on August 12, 2003 shows cardiomegaly, but no __________.  ECG  this morning showed atrial fibrillation at 130 beats per minute with  nonspecific changes.   LABORATORY DATA:  Preoperatively, shows BUN of 24, creatinine 1.7, INR was  2.2.  PT today is 15.8 with an INR of 1.4.   ASSESSMENT AND PLAN:  [name] is a 77 year old gentleman with chronic  atrial fibrillation who has had rapid ventricular rate periprocedural.  Edward  Orr is hemodynamically stable and is asymptomatic from this.  We will  resume his digoxin and increase his Norvasc.  We have advised [name]  that as he advances away from his procedure, his heart rate should settle  down.  He appears to be euvolemic at this point.  We will arrange for Edward  Orr to have his Coumadin checked in [place] and for cardiology follow-  up.  Should [name] have any chest discomfort, shortness of breath or  lower extremity edema or other  signs or symptoms of cardiac instability, I  have instructed him to report to Edward emergency room.                                               Veneda Melter, M.D.    Melton Alar  D:  08/14/2003  T:  08/15/2003  Job:  [id number]   cc:   [place] Bing, M.D.   Salvadore Farber, M.D. [facility]  [address]  [address]  [place]  [place] [address]   Donna Bernard, M.D.  [address]  [place]  [place] [address]  Fax: [phone]   [name]. Edilia Bo, M.D.  [address]  [place]  [place] [address]

## 2010-09-11 NOTE — Discharge Summary (Signed)
[name], [name]                            ACCOUNT NO.:  [id number]   MEDICAL RECORD NO.:  [id number]                   PATIENT TYPE:  INP   LOCATION:  2039                                 FACILITY:  [facility]   PHYSICIAN:  [name]. Edilia Bo, M.D.        DATE OF BIRTH:  1926-01-20   DATE OF ADMISSION:  08/12/2003  DATE OF DISCHARGE:  08/14/2003                                 DISCHARGE SUMMARY   ADMISSION DIAGNOSIS:  Right iliac artery aneurysm.   ADDITIONAL DIAGNOSES/DISCHARGE DIAGNOSES:  1. Right hypogastric artery aneurysm, status post endovascular repair with a     16 x 8.5 iliac limb AneuRx stent graft, completed on August 12, 2003.  2. Hypertension.  3. Hyperlipidemia.  4. History of paroxysmal atrial fibrillation.  5. History of congestive heart failure.  6. History of ischemic cardiomyopathy.  7. History of ruptured thoracic aortic aneurysm in 1996.  8. History of nephrolithiasis.  9. Right popliteal artery aneurysm.  10.      Surgical removal of kidney stones x2 in the 1980s.  11.      Repair of ruptured thoracic aortic aneurysm at [facility] in 1996.  12.      History of laparoscopic cholecystectomy in January 2005.  13.      History of coil embolization of the iliac artery in March 2005 by     Dr. Dietrich Pates.   HOSPITAL PROCEDURE/MANAGEMENT:  1. Endovascular repair of the right hypogastric artery aneurysm with a 16 x     8.5 iliac limb AneuRx stent graft, completed by Dr. Edilia Bo and Dr. Arbie Cookey     of [facility] on August 12, 2003.  2. IV Cardizem therapy for postoperative atrial fibrillation.   CONSULTATIONS:  Cardiology (Dr. Chales Abrahams).   HISTORY OF PRESENT ILLNESS:  Mr. Aikey is a 77 year old white male who was  initially referred to Dr. Edilia Bo by Dr. Samule Ohm for evaluation of a right  hypogastric artery aneurysm. In January 2005, the patient was admitted to  [facility] with acute cholecystitis and during his workup, he  underwent a CT  scan of the abdomen. This incidentally showed a 4.9 cm right  hypogastric artery aneurysm. The patient subsequently underwent  cholecystectomy and recovered well from this. The patient was seen in  consultation by Dr. Edilia Bo and his films were reviewed. The CT scan showed  a 3.9 cm infrarenal abdominal aortic aneurysm with a right common iliac  artery aneurysm of 4.9 cm, with minimal plaque noted. There is marked  tortuosity of the iliac artery systems bilaterally. There was also a 2 cm  aneurysm of the right popliteal artery. The patient was noted to have marked  bilateral hydronephrosis secondary to ureteropelvic junction stones. The  patient had no symptoms which could be referred to this, including abdominal  pain or back pain. Dr. Edilia Bo felt that the patient should undergo repair  of the aneurysm  through an endovascular approach. Given the patient's age  and history of cardiomyopathy as well as other medical issues, he felt that  it would be the safest approach to address this problem. The patient  underwent arteriogram by Dr. Dietrich Pates and branch vessels of the aneurysm  were coiled emboli's. This was completed in March 2005. The patient  tolerated this well and continued to remain asymptomatic. The patient was  then considered for stent graft repair of the hypogastric artery aneurysm.  The risks, benefits and alternatives to the procedure were discussed with  the patient at that time. He was in agreement and wished to proceed with  surgery.   HOSPITAL COURSE:  Mr. Needle was electively admitted to [facility]  on August 12, 2003 and underwent endovascular repair of a right hypogastric  artery aneurysm, utilizing a 16 x 8.5 iliac limb AneuRx stent. This was  completed by Dr. Edilia Bo as well as Dr. Arbie Cookey of [facility]. The patient tolerated  this procedure well and was transferred to the postanesthesia care unit in  stable condition.   Postoperatively, the patient went into rapid  atrial fibrillation. He was  treated appropriately with oral and IV medications. He was restarted on  chronic Coumadin therapy for history of atrial fibrillation.   Overall, the patient did quite well postoperatively. He had no neurologic  deficits. He remained afebrile and his vital signs were stable. His groin  sites remained clean, dry and intact without evidence of infection. His  heart was irregularly irregular and rate-controlled atrial fibrillation. The  lungs were clear. His abdomen was benign. He resumed normal bowel and  bladder function. He had wrist-palpable dorsalis pedis and posterior tibial  pulses bilaterally. The patient's lab values were all within normal limits.   DISPOSITION:  The patient is discharged to home in improved and stable  condition on August 14, 2003.   DISCHARGE MEDICATIONS:  1. Coumadin 2.5 mg daily, then as directed.  2. Cozaar 50 mg daily.  3. Norvasc 10 mg daily.  4. Metoprolol 100 mg twice daily.  5. Lipitor 20 mg daily.  6. Lasix 20 mg daily.  7. Tylox 1-2 tablets every 4-6 hours as needed for pain.   DISCHARGE INSTRUCTIONS:  1. Activity:  The patient should avoid driving, heavy lifting or strenuous     activity for one week. He should continue to walk daily.  2. Diet:  The patient should follow a low-fat, low-salt, heart-healthy diet.  3. Wound care:  The patient may shower. He should wash his incisions daily     with soap and water. He should notify the [facility] office if he has any     redness, swelling or drainage from his incisions.   SPECIAL INSTRUCTIONS:  The patient is to have his PT/INR drawn on Monday,  August 19, 2003, at 10:30 a.m. and followed by Dr. Dietrich Pates.   FOLLOW-UP APPOINTMENTS:  1. The patient has an appointment scheduled with Dr. Dietrich Pates, Wednesday,     Aug 28, 2003 at 2 p.m.  2. The patient is to see Dr. Edilia Bo on Wednesday, Sep 04, 2003 at 9:30 a.m. 3. The patient is to follow up with Dr. Gerda Diss within 3-4 weeks of      discharge.      Carolyn A. Eustaquio Boyden.                  [name]. Edilia Bo, M.D.    CAF/MEDQ  D:  09/18/2003  T:  09/19/2003  Job:  [id number]   cc:   [name]. Edilia Bo, M.D.  [address]  [place]  [place] [address]   [place] Bing, M.D.   Salvadore Farber, M.D. [facility]  [address]  [address]  [place]  [place] [address]   Lorin Picket A. Gerda Diss, M.D.  [address]  [place]  [place] [address]  Fax: [phone]

## 2010-09-11 NOTE — Cardiovascular Report (Signed)
NAME:  [name], [name]                            ACCOUNT NO.:  [id number]   MEDICAL RECORD NO.:  [id number]                   PATIENT TYPE:  OIB   LOCATION:  6733                                 FACILITY:  [facility]   PHYSICIAN:  Salvadore Farber, M.D.             DATE OF BIRTH:  1925/08/17   DATE OF PROCEDURE:  07/16/2003  DATE OF DISCHARGE:  07/17/2003                              CARDIAC CATHETERIZATION   PROCEDURE:  Abdominal aortography, selective angiography of eight branches  of the right internal iliac artery, coil embolization of six branches of the  right internal iliac artery.   INDICATION:  [name] is a 77 year old gentleman with history of ruptured  thoracic aortic aneurysm and ischemic cardiomyopathy with an ejection  fraction of 30%.  In late December 2004, he was found to have a 5.2 x 4.4 cm  aneurysm of the right internal iliac artery.  CT angiogram was performed  which confirmed these findings and demonstrated patency of the contralateral  internal iliac artery.  Given his co-morbidities, I suggested consideration  of endovascular repair of the aneurysm via coil embolization of the outflow  and subsequent covered stent graft placement across the origin of the  internal iliac.  The patient was seen and evaluated by Dr. Cari Caraway who  concurred contingent upon reasonable length landing zone within the common  iliac artery.  The patient was therefore referred today for aortography and  with probable embolization of the internal iliac artery branches.   PROCEDURAL TECHNIQUE:  Informed consent was obtained.  Under 1% lidocaine  local anesthesia, a 5 French sheath was placed in the right common femoral  artery using the modified Seldinger technique.  A 5 French pigtail catheter  with markers was advanced to the suprarenal abdominal  aorta.  Aortography  was performed by power injection. The catheter was then pulled back to the  distal abdominal aorta.  Abdominal  aortography was performed in multiple  views to allow visualization of the takeoff of the internal iliac artery.  I  reviewed the films at that time with Dr. Edilia Bo who agreed that the common  iliac artery would be treatable with endovascular approach.  We thus agreed  on a percutaneous approach.   I then proceeded to selective catheterization of the right internal iliac  artery via an ipsilateral approach using a Motarjeme curve catheter.  Catheter was then exchanged over a Rosen wire for a JR-4 catheter.  Selective angiography was performed by hand injection.  This demonstrated  two major and six minor outflow vessels from the aneurysm sac.  Attention  was first turned to the two major outflow vessels.  The JR-4 catheter was  used to selectively cannulate each in turn.  Angiography was performed by  hand injection to confirm catheter placement.  Each was then embolized using  a series of Cook embolization coils.  Multiple coils were required to  achieve thrombosis.  Attention was then turned to the minor branches.  Four  minor branches were each selectively cannulated using the JR-4 catheter.  Angiography was performed to confirm catheter placement.  Each was then  embolized using a single Cook embolization coil.  Two small branches were  not embolized due to difficulty obtaining favorable catheter position to  allow embolization.  Films were reviewed with Dr. Edilia Bo who felt that  these two remaining small branches posed minimal risk of endo leak and he  agreed with prospect of not proceeding to embolize these given the concern  with further dye administration in this patient with chronic renal  insufficiency.  Final angiography demonstrated no flow in either of the four  small branches and in one of the large branches.  The remaining embolized  large branch had trivial flow.  It is expected that this will completely  occlude in a matter of minutes.  Catheter was then removed over  wire.  The  sheath was removed and manual pressure held.  Complete hemostasis was  obtained.  The patient tolerated the procedure well and was transferred to  the holding room in stable condition.   COMPLICATIONS:  None.   IMPRESSION/PLAN:  Successful embolization of both major internal iliac  artery branches and four small branches. Will plan on hydration overnight.  The patient will follow up with Dr. Edilia Bo for scheduling of covered stent  placement in the common iliac to occlude inflow to the aneurysm.                                               Salvadore Farber, M.D.    WED/MEDQ  D:  07/16/2003  T:  07/19/2003  Job:  [id number]   cc:    Bing, M.D.   Donna Bernard, M.D.  [address]  [place]  [place] [address]  Fax: [phone]   [name]. Edilia Bo, M.D.  [address]  [place]  [place] [address]

## 2010-09-11 NOTE — Op Note (Signed)
NAME:  [name], [name]                            ACCOUNT NO.:  [id number]   MEDICAL RECORD NO.:  [id number]                   PATIENT TYPE:  INP   LOCATION:  3306                                 FACILITY:  [facility]   PHYSICIAN:  [name]. Edilia Bo, M.D.        DATE OF BIRTH:  03-Jul-1925   DATE OF PROCEDURE:  08/12/2003  DATE OF DISCHARGE:                                 OPERATIVE REPORT   PREOPERATIVE DIAGNOSIS:  A 5 cm right hypogastric artery aneurysm.   POSTOPERATIVE DIAGNOSIS:  A 5 cm right hypogastric artery aneurysm.   OPERATION PERFORMED:  Endovascular repair of right hypogastric artery  aneurysm with 16 x 8.5 iliac graft (AneuRx) AND 24 x 3.75 aortic cuff  (AneuRx).   SURGEON:  [name]. Edilia Bo, M.D.   ASSISTANT:  Larina Earthly, M.D.   ANESTHESIA:  General.   INDICATIONS FOR PROCEDURE:  The patient is a 77 year old gentleman who was  found to have a 5 cm right hypogastric artery aneurysm.  He had some ectasia  of his aorta but no frank aneurysmal disease of his infrarenal aorta.  Given  his age and other comorbidities, it was felt that the safest approach was  endovascular repair of his aneurysm.  He underwent successful coil  embolization of the branches feeding his right hypogastric artery aneurysm  by Salvadore Farber, M.D.  He was then brought in for coverage of the  orifice to the hypogastric artery with AneuRx stent graft.  The procedure  and potential complications including but not limit to bleeding, arterial  injury, graft migration, and arterial thrombosis were all discussed with the  patient.  All of his questions were answered and he was agreeable to  proceed.   DESCRIPTION OF PROCEDURE:  The patient was taken to the operating room and  received a general anesthetic.  The abdomen and both groins were prepped and  draped in the usual sterile fashion.  A small oblique incision was made in  the right groin where the common femoral artery was dissected  free down to  the level of the bifurcation.  There were two branches which were controlled  with blue vessel loops.  An 8 French sheath was introduced over a wire  through the right common femoral artery.  The wire was advanced and then a  pigtail catheter passed over the wire and then the pigtail catheter was  exchanged for an Amplatz wire.  Next, percutaneously, a wire was introduced  on the left side with a 5 French sheath on the left and a pigtail catheter  was passed over the wire and positioned in the distal aorta for arteriograms  during the procedure.  Next, with an LAO projection, an iliac arteriogram  was obtained which showed that the hypogastric artery aneurysm was 90%  thrombosed with just one small feeding vessel noted.  Next, over the Amplatz  wire on the right side, the sheath was  removed and then an AneuRx 16 mm x  8.5 cm length iliac limb was advanced over the Amplatz wire and positioned  at the level of the top of the take off of the hypogastric artery.  The  graft was then deployed and then the runners were retracted and the entire  device removed.  Next, an additional film was obtained through the pigtail  catheter on the left and the arteriotomy was slightly extended and the 24 mm  x 3.75 cm aortic cuff was then passed over the Amplatz wire and positioned  at the proximal right common iliac artery.  The graft was successfully  deployed allowing apposition along the length of the common iliac artery and  about 2 cm of overlap within the 16 mm iliac limb.  The runners were then  retracted and the device removed.  An 8 French sheath was placed for  hemostasis.  Completion arteriogram was obtained which showed no evidence of  endo leak and no visualization of the hypogastric artery aneurysm.  Of note,  prior to completing the arteriogram, I did pass a 4 x 14 balloon and  carefully inflated the balloon to 4 atmospheres to be sure there was good  apposition of two grafts.   At the completion, the sheath was removed on the  right groin and the vessels were flushed. The arteriotomy was closed with 5-  0 Prolene suture.  Hemostasis was obtained in the wound.  The wound was  irrigated with saline and the deep layer was closed with 2-0 Vicryl.  The  subcutaneous layer was closed with 2-0 Vicryl and the skin closed with a 4-0  subcuticular stitch.  The sheath was left in place until the ACT was checked  and this will be removed with pressure held for hemostasis.  The patient  tolerated the procedure well with no immediate complications noted.                                               [name]. Edilia Bo, M.D.    CSD/MEDQ  D:  08/12/2003  T:  08/12/2003  Job:  [id number]   cc:   Salvadore Farber, M.D. [facility]  [address]  [address]  [place]  [place] [address]

## 2010-09-17 ENCOUNTER — Ambulatory Visit (INDEPENDENT_AMBULATORY_CARE_PROVIDER_SITE_OTHER): Payer: Medicare Other | Admitting: *Deleted

## 2010-09-17 DIAGNOSIS — Z7901 Long term (current) use of anticoagulants: Secondary | ICD-10-CM

## 2010-09-17 DIAGNOSIS — I4891 Unspecified atrial fibrillation: Secondary | ICD-10-CM

## 2010-09-18 ENCOUNTER — Other Ambulatory Visit: Payer: Self-pay | Admitting: Vascular Surgery

## 2010-09-18 DIAGNOSIS — I714 Abdominal aortic aneurysm, without rupture: Secondary | ICD-10-CM

## 2010-10-15 ENCOUNTER — Ambulatory Visit (INDEPENDENT_AMBULATORY_CARE_PROVIDER_SITE_OTHER): Payer: Medicare Other | Admitting: *Deleted

## 2010-10-15 DIAGNOSIS — Z7901 Long term (current) use of anticoagulants: Secondary | ICD-10-CM

## 2010-10-15 DIAGNOSIS — I4891 Unspecified atrial fibrillation: Secondary | ICD-10-CM

## 2010-11-12 ENCOUNTER — Ambulatory Visit (INDEPENDENT_AMBULATORY_CARE_PROVIDER_SITE_OTHER): Payer: Medicare Other | Admitting: *Deleted

## 2010-11-12 DIAGNOSIS — I4891 Unspecified atrial fibrillation: Secondary | ICD-10-CM

## 2010-11-12 DIAGNOSIS — Z7901 Long term (current) use of anticoagulants: Secondary | ICD-10-CM

## 2010-11-24 ENCOUNTER — Other Ambulatory Visit: Payer: Self-pay

## 2010-11-24 MED ORDER — DIGOXIN 125 MCG PO TABS: 125.0000 ug | ORAL_TABLET | Freq: Every day | ORAL | Status: DC

## 2010-11-24 MED ORDER — WARFARIN SODIUM 5 MG PO TABS: 5.0000 mg | ORAL_TABLET | ORAL | Status: DC

## 2010-12-10 ENCOUNTER — Ambulatory Visit (INDEPENDENT_AMBULATORY_CARE_PROVIDER_SITE_OTHER): Payer: Medicare Other | Admitting: *Deleted

## 2010-12-10 DIAGNOSIS — Z7901 Long term (current) use of anticoagulants: Secondary | ICD-10-CM

## 2010-12-10 DIAGNOSIS — I4891 Unspecified atrial fibrillation: Secondary | ICD-10-CM

## 2010-12-10 LAB — POCT INR: INR: 2.5

## 2010-12-17 ENCOUNTER — Encounter: Payer: Self-pay | Admitting: Vascular Surgery

## 2011-01-07 ENCOUNTER — Ambulatory Visit (INDEPENDENT_AMBULATORY_CARE_PROVIDER_SITE_OTHER): Payer: Medicare Other | Admitting: *Deleted

## 2011-01-07 DIAGNOSIS — I4891 Unspecified atrial fibrillation: Secondary | ICD-10-CM

## 2011-01-07 DIAGNOSIS — Z7901 Long term (current) use of anticoagulants: Secondary | ICD-10-CM

## 2011-01-26 ENCOUNTER — Encounter: Payer: Self-pay | Admitting: Vascular Surgery

## 2011-01-27 ENCOUNTER — Ambulatory Visit
Admission: RE | Admit: 2011-01-27 | Discharge: 2011-01-27 | Disposition: A | Discharge status: Home / Self Care | Payer: Medicare Other | Source: Ambulatory Visit | Attending: Vascular Surgery | Admitting: Vascular Surgery

## 2011-01-27 ENCOUNTER — Encounter: Payer: Self-pay | Admitting: Cardiology

## 2011-01-27 ENCOUNTER — Encounter: Payer: Self-pay | Admitting: Vascular Surgery

## 2011-01-27 ENCOUNTER — Ambulatory Visit (INDEPENDENT_AMBULATORY_CARE_PROVIDER_SITE_OTHER): Payer: Medicare Other | Admitting: Vascular Surgery

## 2011-01-27 VITALS — BP 149/90 | HR 96 | Resp 20 | Ht 70.0 in | Wt 207.7 lb

## 2011-01-27 DIAGNOSIS — I714 Abdominal aortic aneurysm, without rupture: Secondary | ICD-10-CM

## 2011-01-27 NOTE — Progress Notes (Signed)
[facility] [place]  Patient name: [name] MRN: [id number] DOB: [birth year] Sex: male  CC: all up after endovascular aneurysm repair  HPI: [name] is a 77 y.o. 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 This was for a 5 cm right hypogastric artery aneurysm. Was last seen 1 year ago. Since I saw him last year ago he's had no history of abdominal pain or back pain. He does have a history of hypertension and hyperlipidemia both of which are stable on his current medications. Also has a history of nephrolithiasis and renal cysts.  Past Medical History  Diagnosis Date  . Chronic kidney disease, stage III (moderate)     Creatinine of 2.1 in 07/2010  . Hyperlipidemia   . AAA (abdominal aortic aneurysm)   . Hypertension   . CHF (congestive heart failure)   . Atrial fibrillation   . Nephrolithiasis     Family History  Problem Relation Age of Onset  . Heart disease Mother   . Early death Father   . Aneurysm Brother   . Aortic aneurysm Brother     SOCIAL HISTORY: History  Substance Use Topics  . Smoking status: Never Smoker   . Smokeless tobacco: Never Used  . Alcohol Use: No    No Known Allergies  Current Outpatient Prescriptions  Medication Sig Dispense Refill  . Acai Berry 500 MG CAPS Take by mouth.        Marland Kitchen amLODipine (NORVASC) 5 MG tablet Take 5 mg by mouth daily.        . cyanocobalamin 100 MCG tablet Take 100 mcg by mouth daily.        . digoxin (LANOXIN) 0.125 MG tablet Take 125 mcg by mouth. Take one tab daily for 5 days, then hold 2 days, then repeat       . furosemide (LASIX) 40 MG tablet Take 40 mg by mouth daily.        Marland Kitchen HYDROcodone-acetaminophen (VICODIN) 5-500 MG per tablet Take 1 tablet by mouth every 6 (six) hours as needed.        Marland Kitchen losartan (COZAAR) 50 MG tablet Take 50 mg by mouth daily.        . metoprolol (LOPRESSOR) 100 MG tablet Take 100 mg by mouth 2 (two)  times daily.        . simvastatin (ZOCOR) 20 MG tablet Take 20 mg by mouth at bedtime.        Marland Kitchen warfarin (COUMADIN) 5 MG tablet Take 1 tablet (5 mg total) by mouth as directed.  180 tablet  0  . DISCONTD: digoxin (LANOXIN) 0.125 MG tablet Take 1 tablet (125 mcg total) by mouth daily.  90 tablet  0    REVIEW OF SYSTEMS: [name] ] denotes positive finding; [  ] denotes negative finding CARDIOVASCULAR:  [ ]  chest pain   [ ]  chest pressure   [ ]  palpitations   [ ]  orthopnea   [ ]  dyspnea on exertion   [ ]  claudication   [ ]  rest pain   [ ]  DVT   [ ]  phlebitis PULMONARY:   [ ]  productive cough   [ ]  asthma   [ ]  wheezing NEUROLOGIC:   [ ]  weakness  [ ]  paresthesias  [ ]  aphasia  [ ]  amaurosis  [ ]  dizziness HEMATOLOGIC:   [ ]  bleeding problems   [ ]  clotting disorders MUSCULOSKELETAL:  [ ]  joint  pain   [ ]  joint swelling GASTROINTESTINAL: [ ]   blood in stool  [ ]   hematemesis GENITOURINARY:  [ ]   dysuria  [ ]   hematuria PSYCHIATRIC:  [ ]  history of major depression INTEGUMENTARY:  [ ]  rashes  [ ]  ulcers CONSTITUTIONAL:  [ ]  fever   [ ]  chills  PHYSICAL EXAM: Filed Vitals:   01/27/11 1137  BP: 149/90  Pulse: 96  Resp: 20  Height: 5\' 10"  (1.778 m)  Weight: 207 lb 11.2 oz (94.212 kg)   Body mass index is 29.80 kg/(m^2). GENERAL: The patient is a well-nourished male, in no acute distress. The vital signs are documented above. CARDIOVASCULAR: There is a regular rate and rhythm without significant murmur appreciated. I do not detect any carotid bruits. His feet are warm and well-perfused. PULMONARY: There is good air exchange bilaterally without wheezing or rales. ABDOMEN: Soft and non-tender with normal pitched bowel sounds.  MUSCULOSKELETAL: There are no major deformities or cyanosis. NEUROLOGIC: No focal weakness or paresthesias are detected. SKIN: There are no ulcers or rashes noted. PSYCHIATRIC: The patient has a normal affect.  DATA:  I have independently interpreted his CT scan as this  has not yet been read by radiology. The hypogastric artery aneurysm on the right is approximately 4 cm and is decreased in size compared to a year ago. Likewise the infrarenal aneurysm is able in size. It is approximately 4.5 cm in maximum diameter. He has very large bilateral renal cysts.  MEDICAL ISSUES: I reassured him that the stent appears to be in good position in the hypogastric artery aneurysm is slightly smaller in size. We do need to continue to follow the infrarenal abdominal aneurysm. This too has been stable in size. I have ordered a followup ultrasound in one year and we'll alternate that with CAT scan. He knows to call sooner if he has problems.  Loyola Santino S [facility] [place] Office: [phone]

## 2011-02-03 ENCOUNTER — Encounter: Payer: No Typology Code available for payment source | Admitting: *Deleted

## 2011-02-03 ENCOUNTER — Encounter: Payer: Self-pay | Admitting: Cardiology

## 2011-02-03 ENCOUNTER — Ambulatory Visit: Payer: No Typology Code available for payment source | Admitting: Cardiology

## 2011-02-03 DIAGNOSIS — N200 Calculus of kidney: Secondary | ICD-10-CM | POA: Insufficient documentation

## 2011-02-03 DIAGNOSIS — I712 Thoracic aortic aneurysm, without rupture, unspecified: Secondary | ICD-10-CM | POA: Insufficient documentation

## 2011-02-03 DIAGNOSIS — I728 Aneurysm of other specified arteries: Secondary | ICD-10-CM | POA: Insufficient documentation

## 2011-02-03 DIAGNOSIS — IMO0001 Reserved for inherently not codable concepts without codable children: Secondary | ICD-10-CM | POA: Insufficient documentation

## 2011-02-03 DIAGNOSIS — I714 Abdominal aortic aneurysm, without rupture: Secondary | ICD-10-CM | POA: Insufficient documentation

## 2011-02-04 ENCOUNTER — Encounter: Payer: Self-pay | Admitting: Adult Health

## 2011-02-04 ENCOUNTER — Ambulatory Visit (INDEPENDENT_AMBULATORY_CARE_PROVIDER_SITE_OTHER): Payer: Medicare Other | Admitting: *Deleted

## 2011-02-04 ENCOUNTER — Ambulatory Visit (INDEPENDENT_AMBULATORY_CARE_PROVIDER_SITE_OTHER): Payer: Medicare Other | Admitting: Adult Health

## 2011-02-04 DIAGNOSIS — I4891 Unspecified atrial fibrillation: Secondary | ICD-10-CM

## 2011-02-04 DIAGNOSIS — I714 Abdominal aortic aneurysm, without rupture: Secondary | ICD-10-CM

## 2011-02-04 DIAGNOSIS — I428 Other cardiomyopathies: Secondary | ICD-10-CM

## 2011-02-04 DIAGNOSIS — Z7901 Long term (current) use of anticoagulants: Secondary | ICD-10-CM

## 2011-02-04 LAB — POCT INR: INR: 2.3

## 2011-02-04 NOTE — Assessment & Plan Note (Signed)
HR is well controlled on current medication regimen and he is compliant with coumadin.  We discussed need to continue with coumadin. He is happy with it and does not want to switch to Pradaxa, although he asked about it.  He prefers to stay on current meds.  He refuses colonoscopy.  "I just don't want one."  He is due for labs but wishes this to be done through Dr. Fletcher Anon office and is due to see him within the next month.  We will see him annually unless he is having symptoms.

## 2011-02-04 NOTE — Patient Instructions (Signed)
**Note De-identified [name] Obfuscation** Your physician recommends that you continue on your current medications as directed. Please refer to the Current Medication list given to you today.  Your physician recommends that you schedule a follow-up appointment in: 1 year  

## 2011-02-04 NOTE — Progress Notes (Signed)
HPI: [name] is a very pleasant 77 y/o patient of Dr. Dietrich Pates we are seeing on annual follow-up with known history of Atrial Fib, AAA s/p, PVD, and hyperlipidemia.  He is doing well, remains active and is involved in his church.  He has recently been seen by Dr. Durwin Nora and he states that he "is about the same" as last visit with him.  He is monitoring AAA.  He has had no ER visits or admissions.  Coumadin clinic visit is completed on today's evaluation as well.  He has not, and refuses to, have a colonoscopy. He is without complaint.  No Known Allergies  Current Outpatient Prescriptions  Medication Sig Dispense Refill  . Acai Berry 500 MG CAPS Take by mouth.        Marland Kitchen amLODipine (NORVASC) 5 MG tablet Take 5 mg by mouth daily.        . cyanocobalamin 100 MCG tablet Take 100 mcg by mouth daily.        . digoxin (LANOXIN) 0.125 MG tablet Take 125 mcg by mouth. Take one tab daily for 5 days, then hold 2 days, then repeat       . furosemide (LASIX) 40 MG tablet Take 40 mg by mouth daily.        Marland Kitchen HYDROcodone-acetaminophen (VICODIN) 5-500 MG per tablet Take 1 tablet by mouth every 6 (six) hours as needed.        Marland Kitchen losartan (COZAAR) 50 MG tablet Take 50 mg by mouth daily.        . metoprolol (LOPRESSOR) 100 MG tablet Take 100 mg by mouth 2 (two) times daily.        . simvastatin (ZOCOR) 20 MG tablet Take 20 mg by mouth at bedtime.        Marland Kitchen warfarin (COUMADIN) 5 MG tablet Take 1 tablet (5 mg total) by mouth as directed.  180 tablet  0  . DISCONTD: digoxin (LANOXIN) 0.125 MG tablet Take 1 tablet (125 mcg total) by mouth daily.  90 tablet  0    Past Medical History  Diagnosis Date  . Chronic kidney disease, stage III (moderate)     creatinine 1.5 2001, 2.0 2009, 2.28 in 12/2009  . Hyperlipidemia   . AAA (abdominal aortic aneurysm)      3.9 x 3.6 no change 8/05 no change in 2/08; 4.6 cm in 12/2007; 4.8 in 2010 .-followed by Dr. Edilia Bo  . White coat hypertension   . Cardiomyopathy      EF:30-40% in  06/1998, but 50-55% in 2009; CHF in 1999 and 2005  . Chronic atrial fibrillation   . Nephrolithiasis   . Gout   . Vitamin B12 deficiency   . Hearing loss   . Chronic anticoagulation     Past Surgical History  Procedure Date  . Cholecystectomy 2005    4 acute cholecystitis  . Repair thoracic aorta 01/1995  . Peripheral arterial stent graft 06/2003    Right hypogastric artery aneurysm    NFA:OZHYQM of systems complete and found to be negative unless listed above  PHYSICAL EXAM BP 141/78  Pulse 65  Resp 16  Ht 5\' 10"  (1.778 m)  Wt 204 lb (92.534 kg)  BMI 29.27 kg/m2 General: Well developed, well nourished, in no acute distress Head: Eyes PERRLA, No xanthomas.   Normal cephalic and atramatic  Lungs: Clear bilaterally except in the bases. Some crackles are noted, but cleared with cough. Heart: HRIR 1/6 systolic murmur.  Pulses are 2+ & equal.  No carotid bruit. No JVD.  No abdominal bruits. No femoral bruits. Abdomen: Bowel sounds are positive, abdomen soft and non-tender without masses or                  Hernia's noted. Msk:  Back normal, normal gait. Normal strength and tone for age. Extremities: No clubbing, cyanosis , 1+ ankle edema.  DP +1 Neuro: Alert and oriented X 3. Psych:  Good affect, responds appropriately  EKG: Atrial Fib, rate of 69 bpm.  NSTW in the anterior/lateral leads.  ASSESSMENT AND PLAN

## 2011-02-04 NOTE — Assessment & Plan Note (Signed)
He has been seen by Dr. Durwin Nora this month. His note reads "MEDICAL ISSUES:  I reassured him that the stent appears to be in good position in the hypogastric artery aneurysm is slightly smaller in size. We do need to continue to follow the infrarenal abdominal aneurysm. This too has been stable in size. I have ordered a followup ultrasound in one year and we'll alternate that with CAT scan. He knows to call sooner if he has problems."    He will continue to follow Dr. Durwin Nora as directed.

## 2011-02-11 ENCOUNTER — Ambulatory Visit: Payer: Self-pay | Admitting: Adult Health

## 2011-03-04 ENCOUNTER — Ambulatory Visit (INDEPENDENT_AMBULATORY_CARE_PROVIDER_SITE_OTHER): Payer: Medicare Other | Admitting: *Deleted

## 2011-03-04 DIAGNOSIS — Z7901 Long term (current) use of anticoagulants: Secondary | ICD-10-CM

## 2011-03-04 DIAGNOSIS — I4891 Unspecified atrial fibrillation: Secondary | ICD-10-CM

## 2011-03-04 LAB — POCT INR: INR: 3.1

## 2011-04-01 ENCOUNTER — Ambulatory Visit (INDEPENDENT_AMBULATORY_CARE_PROVIDER_SITE_OTHER): Payer: Medicare Other | Admitting: *Deleted

## 2011-04-01 DIAGNOSIS — I4891 Unspecified atrial fibrillation: Secondary | ICD-10-CM

## 2011-04-01 DIAGNOSIS — Z7901 Long term (current) use of anticoagulants: Secondary | ICD-10-CM

## 2011-04-01 LAB — POCT INR: INR: 2.3

## 2011-05-11 ENCOUNTER — Other Ambulatory Visit: Payer: Self-pay | Admitting: *Deleted

## 2011-05-11 MED ORDER — SIMVASTATIN 20 MG PO TABS: 20.0000 mg | ORAL_TABLET | Freq: Every day | ORAL | Status: DC

## 2011-05-11 MED ORDER — AMLODIPINE BESYLATE 5 MG PO TABS: 5.0000 mg | ORAL_TABLET | Freq: Every day | ORAL | Status: DC

## 2011-05-11 MED ORDER — LOSARTAN POTASSIUM 50 MG PO TABS: 50.0000 mg | ORAL_TABLET | Freq: Every day | ORAL | Status: DC

## 2011-05-11 MED ORDER — DIGOXIN 125 MCG PO TABS: 125.0000 ug | ORAL_TABLET | Freq: Every day | ORAL | Status: DC

## 2011-05-11 MED ORDER — FUROSEMIDE 40 MG PO TABS: 40.0000 mg | ORAL_TABLET | Freq: Every day | ORAL | Status: DC

## 2011-05-11 MED ORDER — METOPROLOL TARTRATE 100 MG PO TABS: 100.0000 mg | ORAL_TABLET | Freq: Two times a day (BID) | ORAL | Status: DC

## 2011-05-11 MED ORDER — WARFARIN SODIUM 5 MG PO TABS: 5.0000 mg | ORAL_TABLET | ORAL | Status: DC

## 2011-05-13 ENCOUNTER — Ambulatory Visit (INDEPENDENT_AMBULATORY_CARE_PROVIDER_SITE_OTHER): Payer: Medicare Other | Admitting: *Deleted

## 2011-05-13 ENCOUNTER — Other Ambulatory Visit: Payer: Self-pay | Admitting: *Deleted

## 2011-05-13 DIAGNOSIS — I4891 Unspecified atrial fibrillation: Secondary | ICD-10-CM

## 2011-05-13 DIAGNOSIS — Z7901 Long term (current) use of anticoagulants: Secondary | ICD-10-CM

## 2011-06-24 ENCOUNTER — Ambulatory Visit (INDEPENDENT_AMBULATORY_CARE_PROVIDER_SITE_OTHER): Payer: Medicare Other | Admitting: *Deleted

## 2011-06-24 DIAGNOSIS — I4891 Unspecified atrial fibrillation: Secondary | ICD-10-CM

## 2011-06-24 DIAGNOSIS — Z7901 Long term (current) use of anticoagulants: Secondary | ICD-10-CM

## 2011-08-09 ENCOUNTER — Ambulatory Visit (INDEPENDENT_AMBULATORY_CARE_PROVIDER_SITE_OTHER): Payer: Medicare Other | Admitting: *Deleted

## 2011-08-09 DIAGNOSIS — Z7901 Long term (current) use of anticoagulants: Secondary | ICD-10-CM

## 2011-08-09 DIAGNOSIS — I4891 Unspecified atrial fibrillation: Secondary | ICD-10-CM

## 2011-08-09 LAB — POCT INR: INR: 3.3

## 2011-08-23 ENCOUNTER — Ambulatory Visit (INDEPENDENT_AMBULATORY_CARE_PROVIDER_SITE_OTHER): Payer: Medicare Other | Admitting: *Deleted

## 2011-08-23 DIAGNOSIS — Z7901 Long term (current) use of anticoagulants: Secondary | ICD-10-CM

## 2011-08-23 DIAGNOSIS — I4891 Unspecified atrial fibrillation: Secondary | ICD-10-CM

## 2011-08-23 LAB — POCT INR: INR: 2.2

## 2011-10-04 ENCOUNTER — Ambulatory Visit (INDEPENDENT_AMBULATORY_CARE_PROVIDER_SITE_OTHER): Payer: Medicare Other | Admitting: *Deleted

## 2011-10-04 ENCOUNTER — Telehealth: Payer: Self-pay | Admitting: Cardiology

## 2011-10-04 DIAGNOSIS — Z7901 Long term (current) use of anticoagulants: Secondary | ICD-10-CM

## 2011-10-04 DIAGNOSIS — I4891 Unspecified atrial fibrillation: Secondary | ICD-10-CM

## 2011-10-04 LAB — POCT INR: INR: 2.3

## 2011-10-04 NOTE — Telephone Encounter (Signed)
Patient complaining of edema unilaterally to right leg from knee to ankle.  Has been weighing daily and has not had an increase in weight.  Denies SOB or chest pain.  Next follow up not due until 10/13.  Wants to know if he could increase lasix temporarily.  Currently taking 40 mg daily.  Last BMET below from PCP. Marland Kitchen Patient: [name], [name]                Age: 78([birth year])  Sex,Species: M PID: [id number]               Ordered Date/Time: 05/04/2011 9:44 AM Accession: [id number]      Collected Date/Time: 05/04/2011 7:23 AM Comments:  See continued demographics display   Test Procedure               Results       Units       Normal Range --------------               -------       -----       ------------ Basic Metabolic Panel Sodium                       143           mEq/L       135-145               R Potassium                    5.5     H     mEq/L       3.5-5.3               R Chloride                     108           mEq/L       96-112                R CO2                          24            mEq/L       19-32                 R Glucose                      96            mg/dL       86-57                 R BUN                          41      H     mg/dL       8-46                  R Creatinine                   2.31    H     mg/dL       9.62-9.52             R  Result repeated and verified. Calcium  9.2           mg/dL       1.6-10.9              R

## 2011-10-04 NOTE — Telephone Encounter (Signed)
Patient has c/o fluid build up in right leg below the knee.  Wants to know if he can increase his fluid pill. / tg

## 2011-10-06 NOTE — Telephone Encounter (Signed)
Office visit recommended with patient's primary care physician. We will be happy to evaluate if Dr. Gerda Diss so desires.

## 2011-10-07 NOTE — Telephone Encounter (Signed)
Patient made aware and verbalizes understanding.

## 2011-11-15 ENCOUNTER — Ambulatory Visit (INDEPENDENT_AMBULATORY_CARE_PROVIDER_SITE_OTHER): Payer: Medicare Other | Admitting: *Deleted

## 2011-11-15 DIAGNOSIS — I4891 Unspecified atrial fibrillation: Secondary | ICD-10-CM

## 2011-11-15 DIAGNOSIS — Z7901 Long term (current) use of anticoagulants: Secondary | ICD-10-CM

## 2011-11-15 LAB — POCT INR: INR: 2.5

## 2011-12-29 ENCOUNTER — Ambulatory Visit (INDEPENDENT_AMBULATORY_CARE_PROVIDER_SITE_OTHER): Payer: Medicare Other | Admitting: *Deleted

## 2011-12-29 DIAGNOSIS — Z7901 Long term (current) use of anticoagulants: Secondary | ICD-10-CM

## 2011-12-29 DIAGNOSIS — I4891 Unspecified atrial fibrillation: Secondary | ICD-10-CM

## 2011-12-29 LAB — POCT INR: INR: 2.3

## 2012-02-01 ENCOUNTER — Encounter: Payer: Self-pay | Admitting: Vascular Surgery

## 2012-02-02 ENCOUNTER — Encounter (INDEPENDENT_AMBULATORY_CARE_PROVIDER_SITE_OTHER): Payer: Medicare Other | Admitting: *Deleted

## 2012-02-02 ENCOUNTER — Encounter: Payer: Self-pay | Admitting: Vascular Surgery

## 2012-02-02 ENCOUNTER — Ambulatory Visit (INDEPENDENT_AMBULATORY_CARE_PROVIDER_SITE_OTHER): Payer: Medicare Other | Admitting: Vascular Surgery

## 2012-02-02 VITALS — BP 147/81 | HR 57 | Resp 18 | Ht 70.0 in | Wt 206.8 lb

## 2012-02-02 DIAGNOSIS — I728 Aneurysm of other specified arteries: Secondary | ICD-10-CM

## 2012-02-02 DIAGNOSIS — I714 Abdominal aortic aneurysm, without rupture, unspecified: Secondary | ICD-10-CM

## 2012-02-02 DIAGNOSIS — Z48812 Encounter for surgical aftercare following surgery on the circulatory system: Secondary | ICD-10-CM

## 2012-02-02 DIAGNOSIS — I729 Aneurysm of unspecified site: Secondary | ICD-10-CM

## 2012-02-02 NOTE — Progress Notes (Signed)
[facility] [place]  Patient name: [name] MRN: [id number] DOB: May 11, 1925 Sex: male  REASON FOR VISIT: follow up after EVAR  HPI: [name] is a 78 y.o. 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 The hypogastric artery aneurysm was 5 cm in diameter and decreased in size after repair. We have been following his infrarenal aneurysm which is also been stable in size. He also has large bilateral renal cysts. On his last CT scan the aneurysm measured 4.7 cm in maximum diameter. He comes in for a one year follow up duplex scan.  He does have some chronic stable low back pain. He's had no acute onset abdominal pain or back pain. His only other complaint has been some right leg swelling which hasn't improved significantly. He's also been told that his renal function has gradually worsened. He denies any uremic symptoms.   Past Medical History  Diagnosis Date  . Chronic kidney disease, stage III (moderate)     creatinine 1.5 2001, 2.0 2009, 2.28 in 12/2009  . Hyperlipidemia   . AAA (abdominal aortic aneurysm)      3.9 x 3.6 no change 8/05 no change in 2/08; 4.6 cm in 12/2007; 4.8 in 2010 .-followed by Dr. Edilia Bo  . White coat hypertension   . Cardiomyopathy      EF:30-40% in 06/1998, but 50-55% in 2009; CHF in 1999 and 2005  . Chronic atrial fibrillation   . Nephrolithiasis   . Gout   . Vitamin B12 deficiency   . Hearing loss   . Chronic anticoagulation   . Arthritis     Family History  Problem Relation Age of Onset  . Heart disease Mother   . Early death Father   . Aneurysm Brother   . Aortic aneurysm Brother     SOCIAL HISTORY: History  Substance Use Topics  . Smoking status: Never Smoker   . Smokeless tobacco: Never Used  . Alcohol Use: No    Allergies  Allergen Reactions  . Sulfa Antibiotics     nauseous    Current Outpatient Prescriptions  Medication Sig Dispense Refill  .  amLODipine (NORVASC) 5 MG tablet Take 1 tablet (5 mg total) by mouth daily.  90 tablet  3  . cyanocobalamin 100 MCG tablet Take 100 mcg by mouth daily.        . digoxin (LANOXIN) 0.125 MG tablet Take 1 tablet (125 mcg total) by mouth daily. Take one tab daily for 5 days, then hold 2 days, then repeat  90 tablet  3  . furosemide (LASIX) 40 MG tablet Take 1 tablet (40 mg total) by mouth daily.  90 tablet  3  . losartan (COZAAR) 50 MG tablet Take 1 tablet (50 mg total) by mouth daily.  90 tablet  3  . metoprolol (LOPRESSOR) 100 MG tablet Take 1 tablet (100 mg total) by mouth 2 (two) times daily.  180 tablet  3  . simvastatin (ZOCOR) 20 MG tablet Take 1 tablet (20 mg total) by mouth at bedtime.  90 tablet  3  . warfarin (COUMADIN) 5 MG tablet Take 1 tablet (5 mg total) by mouth as directed.  180 tablet  0  . Acai Berry 500 MG CAPS Take by mouth.        Marland Kitchen HYDROcodone-acetaminophen (VICODIN) 5-500 MG per tablet Take 1 tablet by mouth every 6 (six) hours as needed.  REVIEW OF SYSTEMS: Arly.Keller ] denotes positive finding; [  ] denotes negative finding  CARDIOVASCULAR:  [ ]  chest pain   [ ]  chest pressure   [ ]  palpitations   [ ]  orthopnea   [ ]  dyspnea on exertion   [ ]  claudication   [ ]  rest pain   [ ]  DVT   [ ]  phlebitis PULMONARY:   [ ]  productive cough   [ ]  asthma   [ ]  wheezing NEUROLOGIC:   [ ]  weakness  [ ]  paresthesias  [ ]  aphasia  [ ]  amaurosis  [ ]  dizziness HEMATOLOGIC:   [ ]  bleeding problems   [ ]  clotting disorders MUSCULOSKELETAL:  [ ]  joint pain   [ ]  joint swelling Arly.[email or web address] ] leg swelling- right leg GASTROINTESTINAL: [ ]   blood in stool  [ ]   hematemesis GENITOURINARY:  [ ]   dysuria  [ ]   hematuria PSYCHIATRIC:  [ ]  history of major depression INTEGUMENTARY:  [ ]  rashes  [ ]  ulcers CONSTITUTIONAL:  [ ]  fever   [ ]  chills  PHYSICAL EXAM: Filed Vitals:   02/02/12 0957  BP: 147/81  Pulse: 57  Resp: 18  Height: 5\' 10"  (1.778 m)  Weight: 206 lb 12.8 oz (93.804 kg)   Body mass  index is 29.67 kg/(m^2). GENERAL: The patient is a well-nourished male, in no acute distress. The vital signs are documented above. CARDIOVASCULAR: There is a regular rate and rhythm. I do not detect carotid bruits. PULMONARY: There is good air exchange bilaterally without wheezing or rales. ABDOMEN: Soft and non-tender with normal pitched bowel sounds. His aneurysm is nontender. MUSCULOSKELETAL: There are no major deformities or cyanosis. NEUROLOGIC: No focal weakness or paresthesias are detected. SKIN: There are no ulcers or rashes noted. He does have some hyperpigmentation of both lower extremities consistent with chronic venous insufficiency. He also has some spider veins bilaterally. PSYCHIATRIC: The patient has a normal affect.  DATA:  I have independently interpreted his ultrasound which shows that the maximum diameter of his distal aorta is 4.7 cm and has thus not changed compared to the CT scan in October 2012. Iliac arteries were difficult to visualize.  MEDICAL ISSUES: This patient has a stable 4.7 cm infrarenal abdominal aortic aneurysm. He is previously had repair of a right hypogastric artery aneurysm with the graft limited to the iliac system on the right. I've ordered a follow CT scan of the abdomen and pelvis in one year. This can be done without contrast if his renal function is an issue.  With respect to his right leg swelling which has improved significantly, he does have evidence of chronic venous insufficiency on exam. We have discussed the importance of intermittent leg elevation. I did offer her to right him a prescription for compression stockings which he did not want at this time. I'll see him back in one year. He knows to call sooner if he has problems.  Mariamawit Depaoli S [facility] [place] Beeper: [phone]

## 2012-02-02 NOTE — Addendum Note (Signed)
Addended by: Sharee Pimple on: 02/02/2012 11:54 AM   Modules accepted: Orders

## 2012-02-03 ENCOUNTER — Encounter: Payer: Self-pay | Admitting: Cardiology

## 2012-02-03 ENCOUNTER — Ambulatory Visit (INDEPENDENT_AMBULATORY_CARE_PROVIDER_SITE_OTHER): Payer: Medicare Other | Admitting: *Deleted

## 2012-02-03 ENCOUNTER — Ambulatory Visit (INDEPENDENT_AMBULATORY_CARE_PROVIDER_SITE_OTHER): Payer: Medicare Other | Admitting: Cardiology

## 2012-02-03 VITALS — BP 110/60 | HR 68 | Ht 67.0 in | Wt 179.0 lb

## 2012-02-03 DIAGNOSIS — E785 Hyperlipidemia, unspecified: Secondary | ICD-10-CM

## 2012-02-03 DIAGNOSIS — I482 Chronic atrial fibrillation, unspecified: Secondary | ICD-10-CM

## 2012-02-03 DIAGNOSIS — I728 Aneurysm of other specified arteries: Secondary | ICD-10-CM

## 2012-02-03 DIAGNOSIS — E538 Deficiency of other specified B group vitamins: Secondary | ICD-10-CM

## 2012-02-03 DIAGNOSIS — IMO0001 Reserved for inherently not codable concepts without codable children: Secondary | ICD-10-CM

## 2012-02-03 DIAGNOSIS — I714 Abdominal aortic aneurysm, without rupture: Secondary | ICD-10-CM

## 2012-02-03 DIAGNOSIS — I4891 Unspecified atrial fibrillation: Secondary | ICD-10-CM

## 2012-02-03 DIAGNOSIS — I10 Essential (primary) hypertension: Secondary | ICD-10-CM

## 2012-02-03 DIAGNOSIS — Z7901 Long term (current) use of anticoagulants: Secondary | ICD-10-CM

## 2012-02-03 NOTE — Assessment & Plan Note (Signed)
Excellent control of hyperlipidemia with current therapy, which will be continued. 

## 2012-02-03 NOTE — Assessment & Plan Note (Signed)
Blood pressure is well controlled at this visit.  Current medications will be continued.

## 2012-02-03 NOTE — Assessment & Plan Note (Addendum)
Renal disease is slowly progressive, with an increase from 2.0 to 2.4 over the past 4 years.  At this rate, there is hope that patient will not require renal replacement therapy.  It is not clear that he would be interested in undergoing dialysis if required.

## 2012-02-03 NOTE — Progress Notes (Signed)
Patient ID: [name], male   DOB: October 25, 1925, 78 y.o.   MRN: [id number]  HPI: Scheduled return visit for this delightful octogenarian with widespread vascular disease and multiple cardiovascular risk factors but no known cardiac disease.  He was recently evaluated by his vascular surgeon who found all of his disease to be stable and will plan to repeat imaging in one year.  [name] remains quite mobile without any exercise related symptoms.  He has had no new medical problems nor has he required urgent medical care.  Chronic kidney disease continues to progress at a measured pace.  Patient still has not been seen by nephrology, but plans initial evaluation in the near future.  He would not be inclined to accept dialysis in the event that renal dysfunction progresses to a critical level.  Prior to Admission medications   Medication Sig Start Date End Date Taking? Authorizing Provider  Acai Berry 500 MG CAPS Take by mouth.     Yes Historical Provider, MD  amLODipine (NORVASC) 5 MG tablet Take 1 tablet (5 mg total) by mouth daily. 05/11/11  Yes Kathlen Brunswick, MD  digoxin (LANOXIN) 0.125 MG tablet Take 1 tablet (125 mcg total) by mouth daily. Take one tab daily for 5 days, then hold 2 days, then repeat 05/11/11 05/10/12 Yes Kathlen Brunswick, MD  furosemide (LASIX) 40 MG tablet Take 1 tablet (40 mg total) by mouth daily. 05/11/11  Yes Kathlen Brunswick, MD  losartan (COZAAR) 50 MG tablet Take 1 tablet (50 mg total) by mouth daily. 05/11/11  Yes Kathlen Brunswick, MD  metoprolol (LOPRESSOR) 100 MG tablet Take 1 tablet (100 mg total) by mouth 2 (two) times daily. 05/11/11  Yes Kathlen Brunswick, MD  simvastatin (ZOCOR) 20 MG tablet Take 1 tablet (20 mg total) by mouth at bedtime. 05/11/11  Yes Kathlen Brunswick, MD  warfarin (COUMADIN) 5 MG tablet Take 1 tablet (5 mg total) by mouth as directed. 05/11/11  Yes Kathlen Brunswick, MD    Allergies  Allergen Reactions  . Sulfa Antibiotics Other (See Comments)     Nausea   Past medical history, social history, and family history reviewed and updated.  ROS: Denies orthopnea, PND, palpitations, lightheadedness or syncope.  He has experienced mild intermittent edema in the left leg.  All other systems reviewed and are negative.  PHYSICAL EXAM: BP 110/60  Pulse 68  Ht 5\' 7"  (1.702 m)  Wt 81.194 kg (179 lb)  BMI 28.04 kg/m2  SpO2 95%  General-Well developed; no acute distress Body habitus-Mildly overweight Neck-No JVD; no carotid bruits Lungs-clear lung fields; resonant to percussion Cardiovascular-normal PMI; normal S1 and S2; irregular rhythm; grade 2/6 holosystolic murmur at the apex Abdomen-normal bowel sounds; soft and non-tender without masses or organomegaly Musculoskeletal-No deformities, no cyanosis or clubbing Neurologic-Normal cranial nerves; symmetric strength and tone Skin-Warm, no significant lesions Extremities-1/2+-1+ distal pulses; trace edema  Rhythm Strip:  Atrial fibrillation with controlled ventricular response at a rate of 80 bpm; occasional PVC vs aberrantly conducted beat  ASSESSMENT AND PLAN:  Edward Bing, MD 02/03/2012 1:38 PM

## 2012-02-03 NOTE — Progress Notes (Deleted)
Name: [name]    DOB: 1925-09-24  Age: 78 y.o.  MR#: [id number]       PCP:  [name], MD      Insurance: @PAYORNAME @   CC:   No chief complaint on file.   VS BP 110/60  Pulse 68  Ht 5\' 7"  (1.702 m)  Wt 179 lb (81.194 kg)  BMI 28.04 kg/m2  SpO2 95%  Weights Current Weight  02/03/12 179 lb (81.194 kg)  02/02/12 206 lb 12.8 oz (93.804 kg)  02/04/11 204 lb (92.534 kg)    Blood Pressure  BP Readings from Last 3 Encounters:  02/03/12 110/60  02/02/12 147/81  02/04/11 141/78     Admit date:  (Not on file) Last encounter with RMR:  10/04/2011   Allergy Allergies  Allergen Reactions  . Sulfa Antibiotics     nauseous    Current Outpatient Prescriptions  Medication Sig Dispense Refill  . Acai Berry 500 MG CAPS Take by mouth.        Marland Kitchen amLODipine (NORVASC) 5 MG tablet Take 1 tablet (5 mg total) by mouth daily.  90 tablet  3  . digoxin (LANOXIN) 0.125 MG tablet Take 1 tablet (125 mcg total) by mouth daily. Take one tab daily for 5 days, then hold 2 days, then repeat  90 tablet  3  . furosemide (LASIX) 40 MG tablet Take 1 tablet (40 mg total) by mouth daily.  90 tablet  3  . losartan (COZAAR) 50 MG tablet Take 1 tablet (50 mg total) by mouth daily.  90 tablet  3  . metoprolol (LOPRESSOR) 100 MG tablet Take 1 tablet (100 mg total) by mouth 2 (two) times daily.  180 tablet  3  . simvastatin (ZOCOR) 20 MG tablet Take 1 tablet (20 mg total) by mouth at bedtime.  90 tablet  3  . warfarin (COUMADIN) 5 MG tablet Take 1 tablet (5 mg total) by mouth as directed.  180 tablet  0    Discontinued Meds:    Medications Discontinued During This Encounter  Medication Reason  . cyanocobalamin 100 MCG tablet Discontinued by provider  . HYDROcodone-acetaminophen (VICODIN) 5-500 MG per tablet Discontinued by provider    Patient Active Problem List  Diagnosis  . VITAMIN B12 DEFICIENCY  . HYPERLIPIDEMIA  . Gout  . ATRIAL FIBRILLATION, CHRONIC  . Chronic anticoagulation  . Chronic kidney  disease, stage III (moderate)  . AAA (abdominal aortic aneurysm)  . White coat hypertension  . Nephrolithiasis  . Thoracic aortic aneurysm  . Aneurysm of peripheral artery    LABS Anti-coag visit on 12/29/2011  Component Date Value  . INR 12/29/2011 2.3   Anti-coag visit on 11/15/2011  Component Date Value  . INR 11/15/2011 2.5      Results for this Opt Visit:     Results for orders placed in visit on 12/29/11  POCT INR      Component Value Range   INR 2.3      EKG Orders placed in visit on 02/04/11  . EKG 12-LEAD     Prior Assessment and Plan Problem List as of 02/03/2012            Cardiology Problems   HYPERLIPIDEMIA   ATRIAL FIBRILLATION, CHRONIC   Last Assessment & Plan Note   02/04/2011 Office Visit Signed 02/04/2011  2:24 PM by Jodelle Gross, NP    HR is well controlled on current medication regimen and he is compliant with coumadin.  We discussed  need to continue with coumadin. He is happy with it and does not want to switch to Pradaxa, although he asked about it.  He prefers to stay on current meds.  He refuses colonoscopy.  "I just don't want one."  He is due for labs but wishes this to be done through Dr. Fletcher Anon office and is due to see him within the next month.  We will see him annually unless he is having symptoms.    AAA (abdominal aortic aneurysm)   Last Assessment & Plan Note   02/04/2011 Office Visit Signed 02/04/2011  2:25 PM by Jodelle Gross, NP    He has been seen by Dr. Durwin Nora this month. His note reads "MEDICAL ISSUES:  I reassured him that the stent appears to be in good position in the hypogastric artery aneurysm is slightly smaller in size. We do need to continue to follow the infrarenal abdominal aneurysm. This too has been stable in size. I have ordered a followup ultrasound in one year and we'll alternate that with CAT scan. He knows to call sooner if he has problems."    He will continue to follow Dr. Durwin Nora as directed.      White coat hypertension   Thoracic aortic aneurysm   Aneurysm of peripheral artery     Other   VITAMIN B12 DEFICIENCY   Gout   Chronic anticoagulation   Chronic kidney disease, stage III (moderate)   Nephrolithiasis       Imaging: No results found.   FRS Calculation: Score not calculated

## 2012-02-03 NOTE — Assessment & Plan Note (Signed)
Aneurysm is moderately large, but has been relatively stable in recent years; in fact, there has been no clear enlargement since 2009.  Dr. Edilia Bo continues to monitor.

## 2012-02-03 NOTE — Patient Instructions (Addendum)
Your physician recommends that you schedule a follow-up appointment in: 1 year  

## 2012-02-03 NOTE — Assessment & Plan Note (Signed)
Heart rate is well controlled.  Patient does not report symptoms attributable to his atrial arrhythmia.  Current strategy of anticoagulation and heart rate control will be pursued.

## 2012-03-16 ENCOUNTER — Ambulatory Visit (INDEPENDENT_AMBULATORY_CARE_PROVIDER_SITE_OTHER): Payer: Medicare Other | Admitting: *Deleted

## 2012-03-16 DIAGNOSIS — Z7901 Long term (current) use of anticoagulants: Secondary | ICD-10-CM

## 2012-03-16 DIAGNOSIS — I482 Chronic atrial fibrillation, unspecified: Secondary | ICD-10-CM

## 2012-03-16 DIAGNOSIS — I4891 Unspecified atrial fibrillation: Secondary | ICD-10-CM

## 2012-04-27 ENCOUNTER — Other Ambulatory Visit: Payer: Self-pay | Admitting: Cardiology

## 2012-04-27 ENCOUNTER — Ambulatory Visit (INDEPENDENT_AMBULATORY_CARE_PROVIDER_SITE_OTHER): Payer: Medicare Other | Admitting: *Deleted

## 2012-04-27 DIAGNOSIS — I482 Chronic atrial fibrillation, unspecified: Secondary | ICD-10-CM

## 2012-04-27 DIAGNOSIS — I4891 Unspecified atrial fibrillation: Secondary | ICD-10-CM

## 2012-04-27 DIAGNOSIS — Z7901 Long term (current) use of anticoagulants: Secondary | ICD-10-CM

## 2012-06-07 ENCOUNTER — Ambulatory Visit (INDEPENDENT_AMBULATORY_CARE_PROVIDER_SITE_OTHER): Payer: Medicare Other | Admitting: *Deleted

## 2012-06-07 DIAGNOSIS — Z7901 Long term (current) use of anticoagulants: Secondary | ICD-10-CM

## 2012-06-07 DIAGNOSIS — I482 Chronic atrial fibrillation, unspecified: Secondary | ICD-10-CM

## 2012-06-07 DIAGNOSIS — I4891 Unspecified atrial fibrillation: Secondary | ICD-10-CM

## 2012-06-07 LAB — POCT INR: INR: 3.3

## 2012-07-05 ENCOUNTER — Ambulatory Visit (INDEPENDENT_AMBULATORY_CARE_PROVIDER_SITE_OTHER): Payer: Medicare Other | Admitting: *Deleted

## 2012-07-05 DIAGNOSIS — Z7901 Long term (current) use of anticoagulants: Secondary | ICD-10-CM

## 2012-07-05 DIAGNOSIS — I4891 Unspecified atrial fibrillation: Secondary | ICD-10-CM

## 2012-07-05 DIAGNOSIS — I482 Chronic atrial fibrillation, unspecified: Secondary | ICD-10-CM

## 2012-07-05 LAB — POCT INR: INR: 3.2

## 2012-07-26 ENCOUNTER — Ambulatory Visit (INDEPENDENT_AMBULATORY_CARE_PROVIDER_SITE_OTHER): Payer: Medicare Other | Admitting: *Deleted

## 2012-07-26 DIAGNOSIS — Z7901 Long term (current) use of anticoagulants: Secondary | ICD-10-CM

## 2012-07-26 DIAGNOSIS — I482 Chronic atrial fibrillation, unspecified: Secondary | ICD-10-CM

## 2012-07-26 DIAGNOSIS — I4891 Unspecified atrial fibrillation: Secondary | ICD-10-CM

## 2012-08-23 ENCOUNTER — Other Ambulatory Visit: Payer: Self-pay | Admitting: *Deleted

## 2012-08-23 ENCOUNTER — Ambulatory Visit (INDEPENDENT_AMBULATORY_CARE_PROVIDER_SITE_OTHER): Payer: Medicare Other | Admitting: *Deleted

## 2012-08-23 ENCOUNTER — Telehealth: Payer: Self-pay | Admitting: Cardiology

## 2012-08-23 DIAGNOSIS — Z7901 Long term (current) use of anticoagulants: Secondary | ICD-10-CM

## 2012-08-23 DIAGNOSIS — I4891 Unspecified atrial fibrillation: Secondary | ICD-10-CM

## 2012-08-23 DIAGNOSIS — I482 Chronic atrial fibrillation, unspecified: Secondary | ICD-10-CM

## 2012-08-23 LAB — POCT INR: INR: 2.2

## 2012-08-23 MED ORDER — LOSARTAN POTASSIUM 50 MG PO TABS: 50.0000 mg | ORAL_TABLET | Freq: Every day | ORAL | Status: DC

## 2012-08-23 MED ORDER — DIGOXIN 125 MCG PO TABS: ORAL_TABLET | ORAL | Status: DC

## 2012-08-23 NOTE — Telephone Encounter (Signed)
Patient came in for coumadin check and presented re-order request for medications.  Digoxin Tab 0.125 mg qty 65 Losartan Tab 50 mg qty 90  [phone] Account [id number] [facility]

## 2012-08-23 NOTE — Telephone Encounter (Signed)
Refills for Dig and Cozaar sent to mail order pharmacy.

## 2012-09-06 ENCOUNTER — Telehealth: Payer: Self-pay | Admitting: Family Medicine

## 2012-09-06 ENCOUNTER — Other Ambulatory Visit: Payer: Self-pay

## 2012-09-06 NOTE — Telephone Encounter (Signed)
Med called into [facility]. Patient notified. 

## 2012-09-06 NOTE — Telephone Encounter (Signed)
Ok. Ref times 11

## 2012-09-06 NOTE — Telephone Encounter (Signed)
Refill on Colcrys 0.6mg  [facility], please have it delivered

## 2012-09-20 ENCOUNTER — Ambulatory Visit (INDEPENDENT_AMBULATORY_CARE_PROVIDER_SITE_OTHER): Payer: Medicare Other | Admitting: *Deleted

## 2012-09-20 DIAGNOSIS — I4891 Unspecified atrial fibrillation: Secondary | ICD-10-CM

## 2012-09-20 DIAGNOSIS — Z7901 Long term (current) use of anticoagulants: Secondary | ICD-10-CM

## 2012-09-20 DIAGNOSIS — I482 Chronic atrial fibrillation, unspecified: Secondary | ICD-10-CM

## 2012-09-25 ENCOUNTER — Telehealth: Payer: Self-pay | Admitting: *Deleted

## 2012-09-25 NOTE — Telephone Encounter (Signed)
Not the first time this fellow has behaved like that. He'll call if he needs [place].

## 2012-09-25 NOTE — Telephone Encounter (Signed)
8:47am Patient walked in for an appointment. Patient stated that the gout med we called in last week caused him SOB and weakness and wanted to discuss. Offered patient an 11am Appointment with Dr. Synetta Fail got very upset demanding to be seen immediately. Patient in no visible active distress. Again offered patient the 11am  Appointment - patient got very upset and stormed out without taking appointment.

## 2012-09-26 ENCOUNTER — Ambulatory Visit (INDEPENDENT_AMBULATORY_CARE_PROVIDER_SITE_OTHER): Payer: Medicare Other | Admitting: Family Medicine

## 2012-09-26 ENCOUNTER — Encounter: Payer: Self-pay | Admitting: Family Medicine

## 2012-09-26 VITALS — BP 120/74 | Temp 97.5°F | Wt 209.0 lb

## 2012-09-26 DIAGNOSIS — R5383 Other fatigue: Secondary | ICD-10-CM

## 2012-09-26 DIAGNOSIS — E782 Mixed hyperlipidemia: Secondary | ICD-10-CM

## 2012-09-26 DIAGNOSIS — IMO0001 Reserved for inherently not codable concepts without codable children: Secondary | ICD-10-CM

## 2012-09-26 DIAGNOSIS — I4891 Unspecified atrial fibrillation: Secondary | ICD-10-CM

## 2012-09-26 DIAGNOSIS — R5381 Other malaise: Secondary | ICD-10-CM

## 2012-09-26 DIAGNOSIS — I482 Chronic atrial fibrillation, unspecified: Secondary | ICD-10-CM

## 2012-09-26 DIAGNOSIS — Z79899 Other long term (current) drug therapy: Secondary | ICD-10-CM

## 2012-09-26 DIAGNOSIS — N179 Acute kidney failure, unspecified: Secondary | ICD-10-CM

## 2012-09-26 DIAGNOSIS — M109 Gout, unspecified: Secondary | ICD-10-CM

## 2012-09-26 DIAGNOSIS — I10 Essential (primary) hypertension: Secondary | ICD-10-CM

## 2012-09-26 LAB — CBC WITH DIFFERENTIAL/PLATELET
Basophils Absolute: 0 10*3/uL (ref 0.0–0.1)
Basophils Relative: 0 % (ref 0–1)
Eosinophils Absolute: 0.1 10*3/uL (ref 0.0–0.7)
Eosinophils Relative: 2 % (ref 0–5)
MCH: 31.5 pg (ref 26.0–34.0)
MCHC: 33.5 g/dL (ref 30.0–36.0)
MCV: 93.9 fL (ref 78.0–100.0)
Neutrophils Relative %: 76 % (ref 43–77)
Platelets: 165 10*3/uL (ref 150–400)
RBC: 3.75 MIL/uL — ABNORMAL LOW (ref 4.22–5.81)
RDW: 14.7 % (ref 11.5–15.5)

## 2012-09-26 LAB — TSH: TSH: 4.674 u[IU]/mL — ABNORMAL HIGH (ref 0.350–4.500)

## 2012-09-26 LAB — HEPATIC FUNCTION PANEL
Alkaline Phosphatase: 69 U/L (ref 39–117)
Bilirubin, Direct: 0.2 mg/dL (ref 0.0–0.3)
Indirect Bilirubin: 0.5 mg/dL (ref 0.0–0.9)
Total Protein: 6.7 g/dL (ref 6.0–8.3)

## 2012-09-26 LAB — BASIC METABOLIC PANEL
BUN: 55 mg/dL — ABNORMAL HIGH (ref 6–23)
CO2: 22 mEq/L (ref 19–32)
Chloride: 109 mEq/L (ref 96–112)
Creat: 3.49 mg/dL — ABNORMAL HIGH (ref 0.50–1.35)
Glucose, Bld: 95 mg/dL (ref 70–99)

## 2012-09-26 LAB — LIPID PANEL
LDL Cholesterol: 59 mg/dL (ref 0–99)
Triglycerides: 60 mg/dL (ref <150)

## 2012-09-26 NOTE — Patient Instructions (Addendum)
Please get the blood work as ordered. Avoid colcrys in the future.Try not to run out of your important medicines.

## 2012-09-26 NOTE — Progress Notes (Signed)
  Subjective:    Patient ID: [name], male    DOB: 1925/09/24, 79 y.o.   MRN: [id number]  HPI Patient arrives office with many concerns. He reports progressive fatigue. He notes progressive shortness of breath. HEENT time is. Diminished energy. No suicidal thoughts. No chest pain. Unfortunately patient ran out of his Lanoxin.  The patient feels many of his symptoms are from the colcyrs. He has had no further gout.  We have been following progressive renal insufficiency for years. He has adamantly refused to go to a nephrologist.  History of hypertension. Claims compliance with medications.  Known atrial fibrillation. Feels heart skipping at times.   Review of Systems No headache no weight loss no abdominal pain no change in bowel or urinary habits. No neurological symptoms. 12 point ROS otherwise negative    Objective:   Physical Exam  Alert mild malaise no acute distress. Vitals reviewed. HEENT normal. Lungs clear no crackles or wheezes. Heart rhythm irregular but actually decent rate and control. Ankles trace edema.      Assessment & Plan:  Impression #1 vague symptomatology with patient blaming it on his gout medicine. #2 noncompliance having run out of Lanoxin though with atrial fibrillation #3 hypertension decent control. #4 renal insufficiency worrisome with patient's date concerns plan appropriate blood work. Appropriate management based on this 25 minutes spent most in discussion. WSL

## 2012-09-28 ENCOUNTER — Other Ambulatory Visit: Payer: Self-pay | Admitting: *Deleted

## 2012-09-28 DIAGNOSIS — N179 Acute kidney failure, unspecified: Secondary | ICD-10-CM

## 2012-09-28 NOTE — Addendum Note (Signed)
Addended by: Margaretha Sheffield on: 09/28/2012 05:28 PM   Modules accepted: Orders

## 2012-10-02 ENCOUNTER — Other Ambulatory Visit: Payer: Self-pay | Admitting: Cardiology

## 2012-10-03 ENCOUNTER — Encounter: Payer: Self-pay | Admitting: *Deleted

## 2012-10-05 LAB — BASIC METABOLIC PANEL
CO2: 25 mEq/L (ref 19–32)
Chloride: 108 mEq/L (ref 96–112)
Creat: 2.65 mg/dL — ABNORMAL HIGH (ref 0.50–1.35)
Potassium: 4.8 mEq/L (ref 3.5–5.3)
Sodium: 141 mEq/L (ref 135–145)

## 2012-10-06 ENCOUNTER — Other Ambulatory Visit: Payer: Self-pay | Admitting: Nephrology

## 2012-10-06 DIAGNOSIS — N184 Chronic kidney disease, stage 4 (severe): Secondary | ICD-10-CM

## 2012-10-09 ENCOUNTER — Ambulatory Visit
Admission: RE | Admit: 2012-10-09 | Discharge: 2012-10-09 | Disposition: A | Discharge status: Home / Self Care | Payer: Medicare Other | Source: Ambulatory Visit | Attending: Nephrology | Admitting: Nephrology

## 2012-10-09 DIAGNOSIS — N184 Chronic kidney disease, stage 4 (severe): Secondary | ICD-10-CM

## 2012-10-18 ENCOUNTER — Other Ambulatory Visit: Payer: Self-pay | Admitting: Urology

## 2012-10-19 ENCOUNTER — Telehealth: Payer: Self-pay | Admitting: *Deleted

## 2012-10-19 ENCOUNTER — Encounter (HOSPITAL_BASED_OUTPATIENT_CLINIC_OR_DEPARTMENT_OTHER): Payer: Self-pay | Admitting: *Deleted

## 2012-10-19 ENCOUNTER — Telehealth: Payer: Self-pay | Admitting: Family Medicine

## 2012-10-19 NOTE — Telephone Encounter (Signed)
Pt to call to make an appointment for follow up after surgery

## 2012-10-19 NOTE — Progress Notes (Addendum)
NPO AFTER MN. ARRIVES AT 0645. NEEDS ISTAT 8, CXR AND EKG. WILL TAKE NORVASC, METOPROLOL, AND DIGOXIN AM OF SURG W/ SIPS OF WATER.  REVIEWED CHART W/ DR FORTUNE MDA, OK TO PROCEED.  SPOKE W/ SELITA ABOUT COUMADIN. STATED PER DR NESI, PT DID NOT NEED TO STOP COUMADIN.

## 2012-10-19 NOTE — Telephone Encounter (Signed)
For [name]

## 2012-10-19 NOTE — Telephone Encounter (Signed)
Patient wanted to know if you got anything from the specialist you sent him to. He wants to make sure you hare updated on his findings.He will have a stint put in his kidneys next week. He wants to speak with if possible for a brief moment.

## 2012-10-19 NOTE — Telephone Encounter (Signed)
With all due respect, there is no such thing as speaking to this pt "for a moment". Tell him, yes, I am receiving all of their info, and reviewing it, and I agree with their attempt to open up the channel in the ureter. After all their intervention is done, I recommend an ov for him and I to f u

## 2012-10-24 ENCOUNTER — Encounter (HOSPITAL_BASED_OUTPATIENT_CLINIC_OR_DEPARTMENT_OTHER): Payer: Self-pay

## 2012-10-24 ENCOUNTER — Ambulatory Visit (HOSPITAL_COMMUNITY): Payer: Medicare Other

## 2012-10-24 ENCOUNTER — Ambulatory Visit (HOSPITAL_BASED_OUTPATIENT_CLINIC_OR_DEPARTMENT_OTHER): Payer: Medicare Other | Admitting: Anesthesiology

## 2012-10-24 ENCOUNTER — Encounter (HOSPITAL_BASED_OUTPATIENT_CLINIC_OR_DEPARTMENT_OTHER)
Admission: RE | Disposition: A | Discharge status: Home / Self Care | Payer: Self-pay | Source: Ambulatory Visit | Attending: Urology

## 2012-10-24 ENCOUNTER — Encounter (HOSPITAL_BASED_OUTPATIENT_CLINIC_OR_DEPARTMENT_OTHER): Payer: Self-pay | Admitting: Anesthesiology

## 2012-10-24 ENCOUNTER — Ambulatory Visit (HOSPITAL_BASED_OUTPATIENT_CLINIC_OR_DEPARTMENT_OTHER)
Admission: RE | Admit: 2012-10-24 | Discharge: 2012-10-24 | Disposition: A | Discharge status: Home / Self Care | Payer: Medicare Other | Source: Ambulatory Visit | Attending: Urology | Admitting: Urology

## 2012-10-24 DIAGNOSIS — N289 Disorder of kidney and ureter, unspecified: Secondary | ICD-10-CM | POA: Insufficient documentation

## 2012-10-24 DIAGNOSIS — I4891 Unspecified atrial fibrillation: Secondary | ICD-10-CM | POA: Insufficient documentation

## 2012-10-24 DIAGNOSIS — I10 Essential (primary) hypertension: Secondary | ICD-10-CM | POA: Insufficient documentation

## 2012-10-24 DIAGNOSIS — I517 Cardiomegaly: Secondary | ICD-10-CM | POA: Insufficient documentation

## 2012-10-24 DIAGNOSIS — N400 Enlarged prostate without lower urinary tract symptoms: Secondary | ICD-10-CM | POA: Insufficient documentation

## 2012-10-24 DIAGNOSIS — Z79899 Other long term (current) drug therapy: Secondary | ICD-10-CM | POA: Insufficient documentation

## 2012-10-24 DIAGNOSIS — Z7901 Long term (current) use of anticoagulants: Secondary | ICD-10-CM | POA: Insufficient documentation

## 2012-10-24 DIAGNOSIS — K5730 Diverticulosis of large intestine without perforation or abscess without bleeding: Secondary | ICD-10-CM | POA: Insufficient documentation

## 2012-10-24 DIAGNOSIS — N201 Calculus of ureter: Secondary | ICD-10-CM | POA: Insufficient documentation

## 2012-10-24 DIAGNOSIS — N1330 Unspecified hydronephrosis: Secondary | ICD-10-CM | POA: Insufficient documentation

## 2012-10-24 HISTORY — DX: Venous insufficiency (chronic) (peripheral): I87.2

## 2012-10-24 HISTORY — DX: Unspecified hydronephrosis: N13.30

## 2012-10-24 HISTORY — DX: Calculus of ureter: N20.1

## 2012-10-24 HISTORY — PX: CYSTOSCOPY W/ URETERAL STENT PLACEMENT: SHX1429

## 2012-10-24 HISTORY — DX: Personal history of urinary calculi: Z87.442

## 2012-10-24 HISTORY — DX: Infrarenal abdominal aortic aneurysm, without rupture: I71.43

## 2012-10-24 HISTORY — DX: Personal history of other diseases of the musculoskeletal system and connective tissue: Z87.39

## 2012-10-24 HISTORY — DX: Localized edema: R60.0

## 2012-10-24 HISTORY — DX: Cyst of kidney, acquired: N28.1

## 2012-10-24 HISTORY — DX: Personal history of other diseases of the circulatory system: Z86.79

## 2012-10-24 HISTORY — DX: Unspecified glaucoma: H40.9

## 2012-10-24 HISTORY — DX: Peripheral vascular disease, unspecified: I73.9

## 2012-10-24 HISTORY — DX: Abdominal aortic aneurysm, without rupture: I71.4

## 2012-10-24 HISTORY — DX: Presence of external hearing-aid: Z97.4

## 2012-10-24 LAB — POCT I-STAT, CHEM 8
Calcium, Ion: 1.08 mmol/L — ABNORMAL LOW (ref 1.13–1.30)
Glucose, Bld: 93 mg/dL (ref 70–99)
HCT: 44 % (ref 39.0–52.0)
Hemoglobin: 15 g/dL (ref 13.0–17.0)
TCO2: 21 mmol/L (ref 0–100)

## 2012-10-24 SURGERY — CYSTOSCOPY, WITH RETROGRADE PYELOGRAM AND URETERAL STENT INSERTION
Anesthesia: General | Site: Ureter | Laterality: Bilateral | Wound class: Clean Contaminated

## 2012-10-24 MED ORDER — LIDOCAINE HCL (CARDIAC) 20 MG/ML IV SOLN
INTRAVENOUS | Status: DC | PRN
  Administered 2012-10-24: 80 mg via INTRAVENOUS

## 2012-10-24 MED ORDER — FENTANYL CITRATE 0.05 MG/ML IJ SOLN
INTRAMUSCULAR | Status: DC | PRN
  Administered 2012-10-24: 12.5 ug via INTRAVENOUS
  Administered 2012-10-24: 25 ug via INTRAVENOUS
  Administered 2012-10-24 (×3): 12.5 ug via INTRAVENOUS

## 2012-10-24 MED ORDER — MEPERIDINE HCL 25 MG/ML IJ SOLN
6.2500 mg | INTRAMUSCULAR | Status: DC | PRN
  Filled 2012-10-24: qty 1

## 2012-10-24 MED ORDER — STERILE WATER FOR IRRIGATION IR SOLN
Status: DC | PRN
  Administered 2012-10-24: 6000 mL via INTRAVESICAL

## 2012-10-24 MED ORDER — OXYCODONE HCL 5 MG/5ML PO SOLN
5.0000 mg | Freq: Once | ORAL | Status: DC | PRN
  Filled 2012-10-24: qty 5

## 2012-10-24 MED ORDER — ACETAMINOPHEN 10 MG/ML IV SOLN
1000.0000 mg | Freq: Once | INTRAVENOUS | Status: DC | PRN
  Filled 2012-10-24: qty 100

## 2012-10-24 MED ORDER — DEXAMETHASONE SODIUM PHOSPHATE 4 MG/ML IJ SOLN
INTRAMUSCULAR | Status: DC | PRN
  Administered 2012-10-24: 10 mg via INTRAVENOUS

## 2012-10-24 MED ORDER — LACTATED RINGERS IV SOLN
INTRAVENOUS | Status: DC
  Administered 2012-10-24: 08:00:00 via INTRAVENOUS
  Filled 2012-10-24: qty 1000

## 2012-10-24 MED ORDER — PROPOFOL 10 MG/ML IV BOLUS
INTRAVENOUS | Status: DC | PRN
  Administered 2012-10-24: 160 mg via INTRAVENOUS
  Administered 2012-10-24: 20 mg via INTRAVENOUS

## 2012-10-24 MED ORDER — LACTATED RINGERS IV SOLN
INTRAVENOUS | Status: DC | PRN
  Administered 2012-10-24 (×2): via INTRAVENOUS

## 2012-10-24 MED ORDER — LACTATED RINGERS IV SOLN
INTRAVENOUS | Status: DC
  Filled 2012-10-24: qty 1000

## 2012-10-24 MED ORDER — OXYCODONE HCL 5 MG PO TABS
5.0000 mg | ORAL_TABLET | Freq: Once | ORAL | Status: DC | PRN
  Filled 2012-10-24: qty 1

## 2012-10-24 MED ORDER — KETOROLAC TROMETHAMINE 30 MG/ML IJ SOLN
INTRAMUSCULAR | Status: DC | PRN
  Administered 2012-10-24: 30 mg via INTRAVENOUS

## 2012-10-24 MED ORDER — ONDANSETRON HCL 4 MG/2ML IJ SOLN
INTRAMUSCULAR | Status: DC | PRN
  Administered 2012-10-24: 4 mg via INTRAVENOUS

## 2012-10-24 MED ORDER — EPHEDRINE SULFATE 50 MG/ML IJ SOLN
INTRAMUSCULAR | Status: DC | PRN
  Administered 2012-10-24 (×3): 10 mg via INTRAVENOUS

## 2012-10-24 MED ORDER — HYDROMORPHONE HCL PF 1 MG/ML IJ SOLN
0.2500 mg | INTRAMUSCULAR | Status: DC | PRN
  Filled 2012-10-24: qty 1

## 2012-10-24 MED ORDER — IOHEXOL 350 MG/ML SOLN
INTRAVENOUS | Status: DC | PRN
  Administered 2012-10-24: 10 mL

## 2012-10-24 MED ORDER — CEFAZOLIN SODIUM 1-5 GM-% IV SOLN
1.0000 g | INTRAVENOUS | Status: DC
  Filled 2012-10-24: qty 50

## 2012-10-24 MED ORDER — CEFAZOLIN SODIUM-DEXTROSE 2-3 GM-% IV SOLR
2.0000 g | INTRAVENOUS | Status: AC
  Administered 2012-10-24: 2 g via INTRAVENOUS
  Filled 2012-10-24: qty 50

## 2012-10-24 MED ORDER — PROMETHAZINE HCL 25 MG/ML IJ SOLN
6.2500 mg | INTRAMUSCULAR | Status: DC | PRN
  Filled 2012-10-24: qty 1

## 2012-10-24 SURGICAL SUPPLY — 18 items
BAG DRAIN URO-CYSTO SKYTR STRL (DRAIN) ×2 IMPLANT
BAG DRN UROCATH (DRAIN) ×1
CANISTER SUCT LVC 12 LTR MEDI- (MISCELLANEOUS) ×2 IMPLANT
CATH INTERMIT  6FR 70CM (CATHETERS) ×1 IMPLANT
CATH URET 5FR 28IN CONE TIP (BALLOONS) ×1
CATH URET 5FR 70CM CONE TIP (BALLOONS) IMPLANT
CLOTH BEACON ORANGE TIMEOUT ST (SAFETY) ×2 IMPLANT
DRAPE CAMERA CLOSED 9X96 (DRAPES) ×1 IMPLANT
GLOVE BIO SURGEON STRL SZ7 (GLOVE) ×4 IMPLANT
GLOVE INDICATOR 7.0 STRL GRN (GLOVE) ×1 IMPLANT
GOWN STRL NON-REIN LRG LVL3 (GOWN DISPOSABLE) ×2 IMPLANT
GUIDEWIRE 0.038 PTFE COATED (WIRE) IMPLANT
GUIDEWIRE ANG ZIPWIRE 038X150 (WIRE) ×1 IMPLANT
GUIDEWIRE STR DUAL SENSOR (WIRE) ×1 IMPLANT
NS IRRIG 500ML POUR BTL (IV SOLUTION) IMPLANT
PACK CYSTOSCOPY (CUSTOM PROCEDURE TRAY) ×2 IMPLANT
STENT URET 6FRX28 CONTOUR (STENTS) ×2 IMPLANT
SYRINGE 10CC LL (SYRINGE) ×1 IMPLANT

## 2012-10-24 NOTE — Anesthesia Procedure Notes (Signed)
Procedure Name: LMA Insertion Date/Time: 10/24/2012 8:34 AM Performed by: Jessica Priest Pre-anesthesia Checklist: Patient identified, Emergency Drugs available, Suction available and Patient being monitored Patient Re-evaluated:Patient Re-evaluated prior to inductionOxygen Delivery Method: Circle System Utilized Preoxygenation: Pre-oxygenation with 100% oxygen Intubation Type: IV induction Ventilation: Mask ventilation without difficulty LMA: LMA inserted LMA Size: 5.0 Number of attempts: 1 Airway Equipment and Method: bite block Placement Confirmation: positive ETCO2 Tube secured with: Tape Dental Injury: Teeth and Oropharynx as per pre-operative assessment

## 2012-10-24 NOTE — Anesthesia Preprocedure Evaluation (Addendum)
Anesthesia Evaluation  Patient identified by MRN, date of birth, ID band Patient awake    Reviewed: Allergy & Precautions, H&P , NPO status , Patient's Chart, lab work & pertinent test results, reviewed documented beta blocker date and time   Airway Mallampati: II TM Distance: >3 FB Neck ROM: Full    Dental  (+) Dental Advisory Given and Teeth Intact   Pulmonary neg pulmonary ROS,  breath sounds clear to auscultation        Cardiovascular hypertension, Pt. on medications and Pt. on home beta blockers + Peripheral Vascular Disease and +CHF + dysrhythmias Atrial Fibrillation + Valvular Problems/Murmurs MR Rhythm:Regular Rate:Normal  Echo 01/2008 -  The left ventricle was moderately dilated. Overall left  ventricular systolic function was at the lower limits of normal. Left ventricular ejection fraction was estimated, range being 50 % to 55 %. There was no diagnostic evidence of left ventricular regional wall motion abnormalities. Left ventricular wall thickness was mildly increased.  -  There was mild aortic valvular regurgitation. The aortic regurgitation jet was central.  -  There was mild to moderate aortic root dilatation. There was mild fibrocalcific change of the aortic root. Ascending aortic dimension was 55 mm. There was accoustic shadowing - likely moderate atheroma of the descending aorta, although could correspond to reported thoracic aneurysm repair site. This region could be imaged angiographically if further definition needed.  -  There was mild mitral annular calcification. There was moderate mitral valvular regurgitation. The mitral regurgitation jet was eccentric. The effective orifice of mitral regurgitation by proximal isovelocity surface area was 0.14 cm^2. The volume of mitral regurgitation by proximal isovelocity surface area was 20 cc.  -  The left atrium was moderately to markedly dilated.  -  Right ventricular size was at  the upper limits of normal.  -  There was mild to moderate tricuspid valvular regurgitation.  -  The right atrium was moderately dilated.    Neuro/Psych negative neurological ROS  negative psych ROS   GI/Hepatic negative GI ROS, Neg liver ROS,   Endo/Other  negative endocrine ROS  Renal/GU CRF and Renal InsufficiencyRenal disease     Musculoskeletal negative musculoskeletal ROS (+)   Abdominal   Peds  Hematology negative hematology ROS (+)   Anesthesia Other Findings   Reproductive/Obstetrics                           Anesthesia Physical  Anesthesia Plan  ASA: III  Anesthesia Plan: General   Post-op Pain Management:    Induction: Intravenous  Airway Management Planned: LMA  Additional Equipment:   Intra-op Plan:   Post-operative Plan: Extubation in OR  Informed Consent: I have reviewed the patients History and Physical, chart, labs and discussed the procedure including the risks, benefits and alternatives for the proposed anesthesia with the patient or authorized representative who has indicated his/her understanding and acceptance.   Dental advisory given  Plan Discussed with: CRNA  Anesthesia Plan Comments:         Anesthesia Quick Evaluation  

## 2012-10-24 NOTE — Anesthesia Postprocedure Evaluation (Signed)
Anesthesia Post Note  Patient: [name]  Procedure(s) Performed: Procedure(s) (LRB): CYSTOSCOPY WITH RETROGRADE PYELOGRAM/URETERAL STENT PLACEMENT (Bilateral)  Anesthesia type: General  Patient location: PACU  Post pain: Pain level controlled  Post assessment: Post-op Vital signs reviewed  Last Vitals: BP 141/82  Pulse 67  Temp(Src) 36.4 C (Oral)  Resp 20  Ht 5\' 10"  (1.778 m)  Wt 204 lb (92.534 kg)  BMI 29.27 kg/m2  SpO2 100%  Post vital signs: Reviewed  Level of consciousness: sedated  Complications: No apparent anesthesia complications

## 2012-10-24 NOTE — H&P (Signed)
History of Present Illness  Edward [name] returns today for follow-up.  He is still asymptomatic.  CT scan showed severe bilateral hydronephrosis, bilateral UPJ calculi.  I discussed treatment options with [name] most specifically insertion of bilateral JJ stents and see if that helps improve his renal function.  He understands that the renal parenchyma is extremely thin and we may not notice any changes in his renal function.   Past Medical History Problems  1. History of  Arthritis V13.4 2. History of  Atrial Fibrillation 427.31 3. History of  Glaucoma 365.9 4. History of  Gout 274.9 5. History of  Hypertension 401.9  Surgical History Problems  1. History of  Cholecystectomy 2. History of  Eye Surgery 3. History of  Lithotripsy 4. History of  Surgery For Ruptured Abdominal Aortic Aneurysm  Current Meds 1. AmLODIPine Besylate TABS; Therapy: (Recorded:16Jun2014) to 2. Digoxin TABS; Therapy: (Recorded:16Jun2014) to 3. Furosemide 40 MG Oral Tablet; Therapy: (Recorded:16Jun2014) to 4. Losartan Potassium 50 MG Oral Tablet; Therapy: (Recorded:16Jun2014) to 5. Metoprolol Tartrate TABS; Therapy: (Recorded:16Jun2014) to 6. Simvastatin 20 MG Oral Tablet; Therapy: (Recorded:16Jun2014) to 7. SPS SUSP; Therapy: (Recorded:16Jun2014) to 8. Vitamin B-12 TABS; Therapy: (Recorded:16Jun2014) to 9. Warfarin Sodium 2.5 MG Oral Tablet; Therapy: (Recorded:16Jun2014) to  Allergies Medication  1. Sulfa Drugs  Family History Problems  1. Family history of  Father Deceased At Age 25 2. Family history of  Mother Deceased At Age 54 3. Family history of  Nephrolithiasis  Social History Problems  1. Caffeine Use 3 cups coffee 2. Marital History - Single 3. Occupation: Retired Denied  4. History of  Alcohol Use 5. History of  Tobacco Use  Review of Systems Genitourinary, constitutional, skin, eye, otolaryngeal, hematologic/lymphatic, cardiovascular, pulmonary, endocrine, musculoskeletal,  gastrointestinal, neurological and psychiatric system(s) were reviewed and pertinent findings if present are noted.    Physical Exam Constitutional: Well nourished and well developed . No acute distress.  ENT:. The ears and nose are normal in appearance.  Neck: The appearance of the neck is normal and no neck mass is present.  Pulmonary: No respiratory distress and normal respiratory rhythm and effort.  Cardiovascular: Heart rate and rhythm are normal . No peripheral edema.  Abdomen: The abdomen is soft and nontender. No masses are palpated. No CVA tenderness. No hernias are palpable. No hepatosplenomegaly noted.  Rectal: Rectal exam demonstrates normal sphincter tone, no tenderness and no masses. The prostate has no nodularity and is not tender. The left seminal vesicle is nonpalpable. The right seminal vesicle is nonpalpable. The perineum is normal on inspection.  Genitourinary: Examination of the penis demonstrates no discharge, no masses, no lesions and a normal meatus. The scrotum is without lesions. The right epididymis is palpably normal and non-tender. The left epididymis is palpably normal and non-tender. The right testis is non-tender and without masses. The left testis is non-tender and without masses.  Lymphatics: The femoral and inguinal nodes are not enlarged or tender.  Skin: Normal skin turgor, no visible rash and no visible skin lesions.  Neuro/Psych:. Mood and affect are appropriate.    Results/Data  I independently reviewed the CT scan and the findings are as noted above.     Assessment Assessed  1. Hydronephrosis On The Left 591 2. Hydronephrosis On The Right 591 3. Proximal Ureteral Stone On The Left 592.1 4. Proximal Ureteral Stone On The Right 592.1  Plan Proximal Ureteral Stone On The Left (592.1)  1. Follow-up Schedule Surgery Office  Follow-up  Done: 25Jun2014  He agrees to proceed with bilateral JJ catheters.  The procedure, risks, benefits were explained to  him.  The risks include but are not limited to hemorrhage, infection, inability to insert stents. He understands and wishes to proceed.

## 2012-10-24 NOTE — Op Note (Signed)
[name] is a 79 y.o.   10/24/2012  General  Preop diagnosis: Severe bilateral hydronephrosis, bilateral UPJ calculi.  Postop diagnosis: Same  Procedure done: Cystoscopy, bilateral retrograde pyelograms with interpretation, insertion of bilateral double-J stents  Surgeon: Wendie Simmer. [name]  Anesthesia: General  Indication: Patient is an 79 years old male who was referred by Dr. Eliott Nine for evaluation of bilateral hydronephrosis. He has a renal insufficiency. He was diagnosed in 2004 with bilateral hydronephrosis and bilateral UPJ calculi in [place]. He never had management of the stones. CT scan showed biopsy revealed bilateral hydronephrosis with severe thinning of the renal parenchyma and a right UPJ stone that measures 16 mm and a left UPJ stone that measures 13 mm. The hydronephrosis is in worse now than it was in 2004. He is stable creatinine was 3.49 on June 3 and 2.65 on June 12. Patient doesn't want to go on dialysis if he has renal failure and he does not want to have any invasive procedure either. I explained to the patient that I do not know 8 for relieving the obstruction would help improve his renal function since he has severe thinning of the renal parenchyma. He agrees to have a bilateral double-J stents. If the renal function improves he then would consider ESL however he understands that the event with the decompression of the kidneys the renal function may not improve. He is scheduled today for cystoscopy, retrograde pyelograms and insertion of double-J stents.  Procedure: The patient was identified by his wrist band and proper timeout was taken.  Under general anesthesia he was prepped and draped and placed in the dorsolithotomy position. A panendoscope was inserted in the bladder. The anterior urethra is normal. He has trilobar prostatic hypertrophy. The bladder is heavily trabeculated with cellules and diverticula. It was difficult to identify the ureteral orifices.  After careful inspection a dimple was noted on the right side.  Right retrograde pyelogram:  A cone-tip catheter was passed through the cystoscope and through what appears to be the right ureteral orifice and contrast was injected through the cone-tip catheter. Contrast went up the right ureter. The distal mid and proximal ureter appear normal. There is a filling defect at the UPJ consistent with the known ureteral stone. There was no contrast beyond the filling defect. The cone-tip catheter was removed. A Glidewire was passed through a #6 [place] open-ended catheter. The Glidewire and the open-ended catheter were then passed through the cystoscope and through the right ureter.  The open-ended catheter was advanced over the Glidewire up to the proximal ureter. With some difficulty the Glidewire passed beyond the stone and into the collecting system. The open-ended catheter was then removed. A #6 French-28 double-J stent was then passed over the Glidewire. The proximal end of the double-J stent appears to be beyond the stone. The Glidewire was then removed. The proximal curl of the double-J stent is proximal to the stone in the distal curl is in the bladder.  Left retrograde pyelogram:  It was again difficult to identify the left ureteral orifice but after careful inspection the ureteral orifice was visualized. It was at a difficult angle and I was not able to pass the cone-tip catheter through the ureteral orifice. A Glidewire was passed through a #6 [place] open-ended catheter. The open-ended catheter and Glidewire were then passed through the cystoscope and with some difficulty through the left ureteral orifice. Contrast was injected through the open-ended catheter after removing the Glidewire. The distal ureter appears  normal. The mid and proximal ureter are deviated toward the midline. A large filling defect is again noted at the UPJ. The Glidewire was then passed through the open-ended catheter and  advanced into the collecting system.  The open-ended catheter was removed. A #6 French/28 double-J stent was passed over the Glidewire. On the left-sided is difficult to say whether the tip of the double-J stent is in the proximal to the stone. The Glidewire was removed. The distal curl of the double-J stent is in the bladder. There is no redundancy of the double-J stent in the bladder.  The anatomy of both kidneys is completely distorted. It is impossible to identify the renal pelvis on either side.  The bladder was then emptied and the cystoscope removed.  The patient tolerated the procedure well and left the OR in satisfactory condition to postanesthesia care unit  CC Dr. Camille Bal        Dr. Simone Curia

## 2012-10-24 NOTE — Transfer of Care (Signed)
Immediate Anesthesia Transfer of Care Note  Patient: [name]  Procedure(s) Performed: Procedure(s) (LRB): CYSTOSCOPY WITH RETROGRADE PYELOGRAM/URETERAL STENT PLACEMENT (Bilateral)  Patient Location: PACU  Anesthesia Type: General  Level of Consciousness: awake, sedated, patient cooperative and responds to stimulation  Airway & Oxygen Therapy: Patient Spontanous Breathing and Patient connected to face mask oxygen  Post-op Assessment: Report given to PACU RN, Post -op Vital signs reviewed and stable and Patient moving all extremities  Post vital signs: Reviewed and stable  Complications: No apparent anesthesia complications

## 2012-10-30 ENCOUNTER — Encounter (HOSPITAL_BASED_OUTPATIENT_CLINIC_OR_DEPARTMENT_OTHER): Payer: Self-pay | Admitting: Urology

## 2012-11-01 ENCOUNTER — Ambulatory Visit (INDEPENDENT_AMBULATORY_CARE_PROVIDER_SITE_OTHER): Payer: Medicare Other | Admitting: *Deleted

## 2012-11-01 ENCOUNTER — Ambulatory Visit (INDEPENDENT_AMBULATORY_CARE_PROVIDER_SITE_OTHER): Payer: Medicare Other | Admitting: Family Medicine

## 2012-11-01 ENCOUNTER — Encounter: Payer: Self-pay | Admitting: Family Medicine

## 2012-11-01 VITALS — BP 110/80 | HR 70 | Wt 201.2 lb

## 2012-11-01 DIAGNOSIS — Z7901 Long term (current) use of anticoagulants: Secondary | ICD-10-CM

## 2012-11-01 DIAGNOSIS — IMO0001 Reserved for inherently not codable concepts without codable children: Secondary | ICD-10-CM

## 2012-11-01 DIAGNOSIS — I482 Chronic atrial fibrillation, unspecified: Secondary | ICD-10-CM

## 2012-11-01 DIAGNOSIS — Z79899 Other long term (current) drug therapy: Secondary | ICD-10-CM

## 2012-11-01 DIAGNOSIS — E785 Hyperlipidemia, unspecified: Secondary | ICD-10-CM

## 2012-11-01 DIAGNOSIS — I4891 Unspecified atrial fibrillation: Secondary | ICD-10-CM

## 2012-11-01 DIAGNOSIS — I10 Essential (primary) hypertension: Secondary | ICD-10-CM

## 2012-11-01 DIAGNOSIS — J209 Acute bronchitis, unspecified: Secondary | ICD-10-CM

## 2012-11-01 LAB — POCT INR: INR: 1.9

## 2012-11-01 MED ORDER — AMOXICILLIN 500 MG PO TABS: 500.0000 mg | ORAL_TABLET | Freq: Two times a day (BID) | ORAL | Status: DC

## 2012-11-01 NOTE — Progress Notes (Signed)
  Subjective:    Patient ID: [name], male    DOB: 07-Mar-1926, 79 y.o.   MRN: [id number]  HPI Pt had stents to help obsttrctn. Patient reports no major discomfort from this. Due to see the urologist soon. Unfortunately he is considering not going back to his nephrologist. Doesn't think that he needs her services.  Pt has had cough and congestion, productive at times. No fever.  Left posterior back pain sharp in nature worse with certain motions. Comes and goes.  Now on Kayexalate for hypertension. Wonders if he needs to stay on it.   Review of Systems No chest pain no abdominal pain no fever ROS otherwise negative    Objective:   Physical Exam  Alert no acute distress. Vitals stable. HEENT slight nasal congestion. Lungs no crackles no wheezes heart regular in rhythm. Left periscapular pain and deep palpation.      Assessment & Plan:  Impression 1 acute bronchitis. #2 chronic renal failure discussed at length. #3 hyperkalemia discuss. #4 back pain musculoskeletal discussed. Plan a mocks 500 twice a day 10 days. Met 7 in 2 weeks. Maintain Kayexalate. Encouraged to see both urologist and nephrologist rationale discussed at length. Recheck in several months. WSL

## 2012-11-15 ENCOUNTER — Telehealth: Payer: Self-pay

## 2012-11-15 NOTE — Telephone Encounter (Signed)
Please advise 

## 2012-11-15 NOTE — Telephone Encounter (Signed)
Fax [phone] Pam with [facility] calling, needs to hold coumadin on pt 5 days for surgical procedure, removal of kidney stone.  Procedure has not been scheduled yet, waiting on ok for holding coumadin.

## 2012-11-16 NOTE — Telephone Encounter (Signed)
Warfarin can be held as planned and resumed following the procedure.

## 2012-11-17 ENCOUNTER — Other Ambulatory Visit: Payer: Self-pay | Admitting: Urology

## 2012-11-17 LAB — BASIC METABOLIC PANEL
CO2: 26 mEq/L (ref 19–32)
Calcium: 8.8 mg/dL (ref 8.4–10.5)
Creat: 2.01 mg/dL — ABNORMAL HIGH (ref 0.50–1.35)
Sodium: 141 mEq/L (ref 135–145)

## 2012-11-17 NOTE — Telephone Encounter (Signed)
Pam advised with Urology office, requested phone notation be faxed to [phone], faxed via epic attention Pam

## 2012-11-21 ENCOUNTER — Encounter (HOSPITAL_COMMUNITY): Payer: Self-pay | Admitting: Pharmacy Technician

## 2012-11-21 ENCOUNTER — Encounter (HOSPITAL_COMMUNITY): Payer: Self-pay | Admitting: *Deleted

## 2012-11-24 NOTE — H&P (Signed)
Reason For Visit  [name] is an 79 year old male patient of Dr. Brunilda Payor was seen for consideration of left ureteroscopy with removal of migrated stent and treatment of his UPJ stone.   History of Present Illness  Bilateral UPJ stones: The patient was found to have extremely severe bilateral hydronephrosis and underwent double-J stent placement bilaterally on 10/24/12.  His hydronephrosis was secondary to bilateral UPJ stones.   Past Medical History Problems  1. History of  Arthritis V13.4 2. History of  Atrial Fibrillation 427.31 3. History of  Chronic Kidney Disease, Stage 4 585.4 4. History of  Glaucoma 365.9 5. History of  Gout 274.9 6. History of  Hypertension 401.9  Surgical History Problems  1. History of  Cholecystectomy 2. History of  Cystoscopy With Insertion Of Ureteral Stent Bilateral 3. History of  Eye Surgery 4. History of  Lithotripsy 5. History of  Surgery For Ruptured Abdominal Aortic Aneurysm  Current Meds 1. AmLODIPine Besylate TABS; Therapy: (Recorded:16Jun2014) to 2. Amoxicillin 500 MG Oral Capsule; Therapy: (Recorded:16Jul2014) to 3. Digoxin TABS; Therapy: (Recorded:16Jun2014) to 4. Furosemide 40 MG Oral Tablet; Therapy: (Recorded:16Jun2014) to 5. Losartan Potassium 50 MG Oral Tablet; Therapy: (Recorded:16Jun2014) to 6. Metoprolol Tartrate TABS; Therapy: (Recorded:16Jun2014) to 7. Simvastatin 20 MG Oral Tablet; Therapy: (Recorded:16Jun2014) to 8. SPS SUSP; Therapy: (Recorded:16Jun2014) to 9. Vitamin B-12 TABS; Therapy: (Recorded:16Jun2014) to 10. Warfarin Sodium 2.5 MG Oral Tablet; Therapy: (Recorded:16Jun2014) to  Allergies Medication  1. Sulfa Drugs  Family History Problems  1. Family history of  Father Deceased At Age 43 2. Family history of  Mother Deceased At Age 39 3. Family history of  Nephrolithiasis  Social History Problems  1. Caffeine Use 3 cups coffee 2. Marital History - Single 3. Occupation: Retired Denied  4. History of  Alcohol  Use 5. History of  Tobacco Use  Review of Systems Genitourinary, constitutional, skin, eye, otolaryngeal, hematologic/lymphatic, cardiovascular, pulmonary, endocrine, musculoskeletal, gastrointestinal, neurological and psychiatric system(s) were reviewed and pertinent findings if present are noted.  Genitourinary: urinary frequency and nocturia.  Constitutional: feeling tired (fatigue).  ENT: sinus problems.  Respiratory: shortness of breath.  Musculoskeletal: joint pain.    Vitals Vital Signs BMI Calculated: 30.81 BSA Calculated: 2.1 Height: 5 ft 9 in Weight: 208 lb  Blood Pressure: 129 / 84 Temperature: 97.6 F Heart Rate: 82  Physical Exam Constitutional: Well nourished and well developed . No acute distress.  ENT:. The ears and nose are normal in appearance.  Neck: The appearance of the neck is normal and no neck mass is present.  Pulmonary: No respiratory distress and normal respiratory rhythm and effort.  Cardiovascular: Heart rate and rhythm are normal . No peripheral edema.  Abdomen: The abdomen is soft and nontender. No masses are palpated. No CVA tenderness. No hernias are palpable. No hepatosplenomegaly noted.  Rectal: Rectal exam demonstrates normal sphincter tone, no tenderness and no masses. Estimated prostate size is 2+. The prostate has no nodularity and is not tender. The left seminal vesicle is nonpalpable. The right seminal vesicle is nonpalpable. The perineum is normal on inspection.  Genitourinary: Examination of the penis demonstrates no discharge, no masses, no lesions and a normal meatus. The scrotum is without lesions. The right epididymis is palpably normal and non-tender. The left epididymis is palpably normal and non-tender. The right testis is non-tender and without masses. The left testis is non-tender and without masses.  Lymphatics: The femoral and inguinal nodes are not enlarged or tender.  Skin: Normal skin turgor, no visible rash  and no visible skin  lesions.  Neuro/Psych:. Mood and affect are appropriate.   Assessment      I have discussed with the patient the fact that he currently has bilateral stents with his right stent in good position.  His left stent appears to have migrated up the left ureter and therefore, at the least, needs to be repositioned.  Dr. Brunilda Payor had discussed with him the need to consider treatment of his stones and had discussed lithotripsy versus ureteroscopy.  Since he already needs to undergo a general anesthetic for stent retrieval and replacement it would make sense to proceed at that time with ureteroscopy and laser lithotripsy of his left proximal ureteral stone.  I therefore have discussed ureteroscopy with him in detail including its risks and complications, the probability of success, the outpatient nature of the procedure as well as the anticipated postoperative course. We then went over the fact that his left ureteral stent would probably be able to come out and depending on my findings we may then consider treating his right UPJ stone was either ESL or ureteroscopy.   Plan   1. He will need to stop his Coumadin 5 days prior to his surgery. I will need to get his cardiologist to sign off on this. 2.   He is scheduled for left stent retrieval and ureteroscopy with laser lithotripsy of his left UPJ stone.

## 2012-11-26 MED ORDER — CIPROFLOXACIN IN D5W 200 MG/100ML IV SOLN
200.0000 mg | INTRAVENOUS | Status: AC
  Administered 2012-11-27: 200 mg via INTRAVENOUS
  Filled 2012-11-26: qty 100

## 2012-11-27 ENCOUNTER — Ambulatory Visit (HOSPITAL_COMMUNITY): Payer: Medicare Other | Admitting: Anesthesiology

## 2012-11-27 ENCOUNTER — Encounter (HOSPITAL_COMMUNITY): Payer: Self-pay | Admitting: *Deleted

## 2012-11-27 ENCOUNTER — Ambulatory Visit (HOSPITAL_COMMUNITY)
Admission: RE | Admit: 2012-11-27 | Discharge: 2012-11-27 | Disposition: A | Discharge status: Home / Self Care | Payer: Medicare Other | Source: Ambulatory Visit | Attending: Urology | Admitting: Urology

## 2012-11-27 ENCOUNTER — Encounter (HOSPITAL_COMMUNITY)
Admission: RE | Disposition: A | Discharge status: Home / Self Care | Payer: Self-pay | Source: Ambulatory Visit | Attending: Urology

## 2012-11-27 ENCOUNTER — Encounter (HOSPITAL_COMMUNITY): Payer: Self-pay | Admitting: Anesthesiology

## 2012-11-27 ENCOUNTER — Ambulatory Visit (HOSPITAL_COMMUNITY): Payer: Medicare Other

## 2012-11-27 DIAGNOSIS — Y929 Unspecified place or not applicable: Secondary | ICD-10-CM | POA: Insufficient documentation

## 2012-11-27 DIAGNOSIS — X58XXXA Exposure to other specified factors, initial encounter: Secondary | ICD-10-CM | POA: Insufficient documentation

## 2012-11-27 DIAGNOSIS — M129 Arthropathy, unspecified: Secondary | ICD-10-CM | POA: Insufficient documentation

## 2012-11-27 DIAGNOSIS — N184 Chronic kidney disease, stage 4 (severe): Secondary | ICD-10-CM | POA: Insufficient documentation

## 2012-11-27 DIAGNOSIS — Z7901 Long term (current) use of anticoagulants: Secondary | ICD-10-CM | POA: Insufficient documentation

## 2012-11-27 DIAGNOSIS — N201 Calculus of ureter: Secondary | ICD-10-CM

## 2012-11-27 DIAGNOSIS — Z882 Allergy status to sulfonamides status: Secondary | ICD-10-CM | POA: Insufficient documentation

## 2012-11-27 DIAGNOSIS — I129 Hypertensive chronic kidney disease with stage 1 through stage 4 chronic kidney disease, or unspecified chronic kidney disease: Secondary | ICD-10-CM | POA: Insufficient documentation

## 2012-11-27 DIAGNOSIS — M109 Gout, unspecified: Secondary | ICD-10-CM | POA: Insufficient documentation

## 2012-11-27 DIAGNOSIS — H409 Unspecified glaucoma: Secondary | ICD-10-CM | POA: Insufficient documentation

## 2012-11-27 DIAGNOSIS — I4891 Unspecified atrial fibrillation: Secondary | ICD-10-CM | POA: Insufficient documentation

## 2012-11-27 DIAGNOSIS — T8389XA Other specified complication of genitourinary prosthetic devices, implants and grafts, initial encounter: Secondary | ICD-10-CM | POA: Insufficient documentation

## 2012-11-27 HISTORY — PX: CYSTOSCOPY WITH URETEROSCOPY: SHX5123

## 2012-11-27 HISTORY — PX: HOLMIUM LASER APPLICATION: SHX5852

## 2012-11-27 LAB — APTT: aPTT: 37 seconds (ref 24–37)

## 2012-11-27 LAB — BASIC METABOLIC PANEL
BUN: 34 mg/dL — ABNORMAL HIGH (ref 6–23)
CO2: 26 mEq/L (ref 19–32)
Calcium: 9.3 mg/dL (ref 8.4–10.5)
Chloride: 103 mEq/L (ref 96–112)
Creatinine, Ser: 2.4 mg/dL — ABNORMAL HIGH (ref 0.50–1.35)
GFR calc Af Amer: 27 mL/min — ABNORMAL LOW (ref >90)
GFR calc non Af Amer: 23 mL/min — ABNORMAL LOW (ref >90)
Glucose, Bld: 102 mg/dL — ABNORMAL HIGH (ref 70–99)
Potassium: 4.6 mEq/L (ref 3.5–5.1)
Sodium: 138 mEq/L (ref 135–145)

## 2012-11-27 LAB — PROTIME-INR
INR: 1.36 (ref 0.00–1.49)
Prothrombin Time: 16.4 seconds — ABNORMAL HIGH (ref 11.6–15.2)

## 2012-11-27 LAB — CBC
HCT: 39.3 % (ref 39.0–52.0)
Hemoglobin: 12.6 g/dL — ABNORMAL LOW (ref 13.0–17.0)
MCH: 31.1 pg (ref 26.0–34.0)
MCHC: 32.1 g/dL (ref 30.0–36.0)
MCV: 97 fL (ref 78.0–100.0)
Platelets: 197 10*3/uL (ref 150–400)
RBC: 4.05 MIL/uL — ABNORMAL LOW (ref 4.22–5.81)
RDW: 13.4 % (ref 11.5–15.5)
WBC: 6.2 10*3/uL (ref 4.0–10.5)

## 2012-11-27 SURGERY — CYSTOSCOPY WITH URETEROSCOPY
Anesthesia: General | Laterality: Left | Wound class: Clean Contaminated

## 2012-11-27 MED ORDER — DEXAMETHASONE SODIUM PHOSPHATE 10 MG/ML IJ SOLN
INTRAMUSCULAR | Status: DC | PRN
  Administered 2012-11-27: 8 mg via INTRAVENOUS

## 2012-11-27 MED ORDER — FENTANYL CITRATE 0.05 MG/ML IJ SOLN: 25.0000 ug | INTRAMUSCULAR | Status: DC | PRN

## 2012-11-27 MED ORDER — PROPOFOL INFUSION 10 MG/ML OPTIME: INTRAVENOUS | Status: DC | PRN

## 2012-11-27 MED ORDER — 0.9 % SODIUM CHLORIDE (POUR BTL) OPTIME
TOPICAL | Status: DC | PRN
  Administered 2012-11-27: 1000 mL

## 2012-11-27 MED ORDER — PROMETHAZINE HCL 25 MG/ML IJ SOLN: 6.2500 mg | INTRAMUSCULAR | Status: DC | PRN

## 2012-11-27 MED ORDER — ONDANSETRON HCL 4 MG/2ML IJ SOLN
INTRAMUSCULAR | Status: DC | PRN
  Administered 2012-11-27 (×2): 2 mg via INTRAVENOUS

## 2012-11-27 MED ORDER — IOHEXOL 300 MG/ML  SOLN
INTRAMUSCULAR | Status: AC
  Filled 2012-11-27: qty 1

## 2012-11-27 MED ORDER — SODIUM CHLORIDE 0.9 % IV SOLN
INTRAVENOUS | Status: DC | PRN
  Administered 2012-11-27: 07:00:00 via INTRAVENOUS

## 2012-11-27 MED ORDER — MEPERIDINE HCL 50 MG/ML IJ SOLN: 6.2500 mg | INTRAMUSCULAR | Status: DC | PRN

## 2012-11-27 MED ORDER — IOHEXOL 300 MG/ML  SOLN
INTRAMUSCULAR | Status: DC | PRN
  Administered 2012-11-27: 10 mL

## 2012-11-27 MED ORDER — SODIUM CHLORIDE 0.9 % IR SOLN
Status: DC | PRN
  Administered 2012-11-27: 3000 mL

## 2012-11-27 MED ORDER — HYDROCODONE-ACETAMINOPHEN 7.5-325 MG PO TABS: 1.0000 | ORAL_TABLET | ORAL | Status: DC | PRN

## 2012-11-27 MED ORDER — FENTANYL CITRATE 0.05 MG/ML IJ SOLN
INTRAMUSCULAR | Status: DC | PRN
  Administered 2012-11-27 (×6): 25 ug via INTRAVENOUS

## 2012-11-27 MED ORDER — LACTATED RINGERS IV SOLN: INTRAVENOUS | Status: DC

## 2012-11-27 MED ORDER — SODIUM CHLORIDE 0.9 % IV SOLN
INTRAVENOUS | Status: DC
  Administered 2012-11-27: 1000 mL via INTRAVENOUS

## 2012-11-27 MED ORDER — LIDOCAINE HCL 2 % EX GEL
CUTANEOUS | Status: AC
  Filled 2012-11-27: qty 10

## 2012-11-27 MED ORDER — LIDOCAINE HCL (CARDIAC) 20 MG/ML IV SOLN
INTRAVENOUS | Status: DC | PRN
  Administered 2012-11-27: 30 mg via INTRAVENOUS

## 2012-11-27 MED ORDER — LACTATED RINGERS IV SOLN: INTRAVENOUS | Status: DC | PRN

## 2012-11-27 MED ORDER — EPHEDRINE SULFATE 50 MG/ML IJ SOLN
INTRAMUSCULAR | Status: DC | PRN
  Administered 2012-11-27 (×2): 5 mg via INTRAVENOUS
  Administered 2012-11-27: 10 mg via INTRAVENOUS
  Administered 2012-11-27 (×3): 5 mg via INTRAVENOUS
  Administered 2012-11-27: 15 mg via INTRAVENOUS
  Administered 2012-11-27 (×2): 5 mg via INTRAVENOUS
  Administered 2012-11-27: 10 mg via INTRAVENOUS

## 2012-11-27 MED ORDER — SODIUM CHLORIDE 0.9 % IR SOLN
Status: DC | PRN
  Administered 2012-11-27: 3000 mL via INTRAVESICAL

## 2012-11-27 MED ORDER — PROPOFOL 10 MG/ML IV BOLUS
INTRAVENOUS | Status: DC | PRN
  Administered 2012-11-27: 5 mg via INTRAVENOUS
  Administered 2012-11-27: 10 mg via INTRAVENOUS
  Administered 2012-11-27: 150 mg via INTRAVENOUS
  Administered 2012-11-27: 5 mg via INTRAVENOUS
  Administered 2012-11-27: 30 mg via INTRAVENOUS
  Administered 2012-11-27 (×4): 5 mg via INTRAVENOUS
  Administered 2012-11-27: 10 mg via INTRAVENOUS
  Administered 2012-11-27 (×2): 5 mg via INTRAVENOUS
  Administered 2012-11-27: 10 mg via INTRAVENOUS
  Administered 2012-11-27: 20 mg via INTRAVENOUS
  Administered 2012-11-27: 5 mg via INTRAVENOUS
  Administered 2012-11-27: 15 mg via INTRAVENOUS

## 2012-11-27 SURGICAL SUPPLY — 24 items
BAG URO CATCHER STRL LF (DRAPE) ×2 IMPLANT
BASKET ZERO TIP NITINOL 2.4FR (BASKET) ×2 IMPLANT
BSKT STON RTRVL ZERO TP 2.4FR (BASKET) ×2
CATH INTERMIT  6FR 70CM (CATHETERS) ×2 IMPLANT
CATH URET 5FR 28IN CONE TIP (BALLOONS)
CATH URET 5FR 28IN OPEN ENDED (CATHETERS) ×1 IMPLANT
CATH URET 5FR 70CM CONE TIP (BALLOONS) IMPLANT
CLOTH BEACON ORANGE TIMEOUT ST (SAFETY) ×2 IMPLANT
DRAPE CAMERA CLOSED 9X96 (DRAPES) ×2 IMPLANT
FORCEPS BIOP 2.4F 115CM BACKLD (INSTRUMENTS) ×1 IMPLANT
FORCEPS GRASP N/RETR BSKT (INSTRUMENTS) ×1 IMPLANT
GLOVE BIOGEL M 8.0 STRL (GLOVE) ×2 IMPLANT
GLOVE SURG SS PI 8.0 STRL IVOR (GLOVE) ×1 IMPLANT
GOWN PREVENTION PLUS XLARGE (GOWN DISPOSABLE) ×1 IMPLANT
GOWN STRL REIN XL XLG (GOWN DISPOSABLE) ×2 IMPLANT
GUIDEWIRE ANG ZIPWIRE 038X150 (WIRE) ×2 IMPLANT
GUIDEWIRE STR DUAL SENSOR (WIRE) ×4 IMPLANT
LASER FIBER DISP (UROLOGICAL SUPPLIES) ×2 IMPLANT
MANIFOLD NEPTUNE II (INSTRUMENTS) ×2 IMPLANT
MARKER SKIN DUAL TIP RULER LAB (MISCELLANEOUS) ×1 IMPLANT
PACK CYSTO (CUSTOM PROCEDURE TRAY) ×2 IMPLANT
SHEATH ACCESS URETERAL 38CM (SHEATH) ×1 IMPLANT
STENT CONTOUR 8FR X 24 (STENTS) ×1 IMPLANT
TUBING CONNECTING 10 (TUBING) ×2 IMPLANT

## 2012-11-27 NOTE — Interval H&P Note (Signed)
History and Physical Interval Note:  11/27/2012 7:19 AM  [name]  has presented today for surgery, with the diagnosis of Migrated Left Ureteral Stent/Left UPJ Stone  The various methods of treatment have been discussed with the patient and family. After consideration of risks, benefits and other options for treatment, the patient has consented to  Procedure(s): LEFT URETEROSCOPY WITH LASER LITHOTRIPSY/REMOVAL OF MIGRATED STENT (Left) HOLMIUM LASER APPLICATION (N/A) as a surgical intervention .  The patient's history has been reviewed, patient examined, no change in status, stable for surgery.  I have reviewed the patient's chart and labs.  Questions were answered to the patient's satisfaction.     Garnett Farm

## 2012-11-27 NOTE — Op Note (Signed)
PATIENT:  [name]  PRE-OPERATIVE DIAGNOSIS: 1.  left Ureteral calculus 2. Migrated left ureteral stent  POST-OPERATIVE DIAGNOSIS: Same  PROCEDURE:  1. Cystoscopy with left retrograde pyelogram including interpretation. 2. Ureteroscopy and removal of foreign body/migrated ureteral stent. 3. Ureteroscopy and laser lithotripsy of left proximal ureteral stone 4. Ureteroscopic stone extraction. 5. Left double-J stent placement  SURGEON: Garnett Farm, MD  INDICATION: [name] is an 79 year old male who was found to have bilateral proximal ureteral stones with marked hydronephrosis and loss of renal parenchyma bilaterally with resultant renal insufficiency. He had double-J stents placed in order to bypass the stones and the stent on the left side migrated up the left ureter. We therefore discussed repositioning of the stent but, because he was going to be under anesthesia for that I recommended treatment of his proximal ureteral stone as well. He has elected to proceed.  ANESTHESIA:  General  EBL:  Minimal  DRAINS: 8 French, 24 cm double-J stent in the left ureter (no string)  SPECIMEN:  None  DISPOSITION OF SPECIMEN:  N/A  DESCRIPTION OF PROCEDURE: The patient was taken to the major OR and placed on the table. General anesthesia was administered and then the patient was moved to the dorsal lithotomy position. The genitalia was sterilely prepped and draped. An official timeout was performed.  Initially the 22 French cystoscope with 12 lens was passed under direct vision down the urethra, through the prostate and into the bladder.. The bladder was then entered and fully inspected. It was noted be free of any tumors stones or inflammatory lesions. There was 3-4+ trabeculation. Ureteral stents were noted exiting both ureteral orifices. Ureteral orifices were of normal configuration and position. A 6 French open-ended ureteral catheter was then passed through the cystoscope into the  ureteral orifice in order to perform a left retrograde pyelogram.  A retrograde pyelogram was performed by injecting full-strength contrast up the left ureter under direct fluoroscopic control. This revealed a somewhat tortuous ureter with the stent seen in the proximal ureter just distal to the stone. The stone was visualized as a filling defect in the ureter and then the contrast could be seen entering a huge, dilated left renal pelvis. I then passed a 0.038 inch floppy-tipped guidewire through the open ended catheter and into the area of the renal pelvis and this was left in place. The inner portion of a ureteral access sheath was then passed over the guidewire to gently dilate the intramural ureter. A second 0.038 inch floppy-tipped guidewire was then passed up the ureter and into the area the renal pelvis as well. This was saved and attached to the drape as a safety guidewire. Over the working guidewire I passed the ureteral access sheath with inner cannula and then removed the working guidewire and inner portion of the access sheath and then proceeded with flexible ureteroscopy.  The 6 French flexible digital ureteroscope was then passed up the access sheath and I was able to visualize the distal aspect of the stent. I tried to grasp it with a Nitinol basket and although I was able to engage the stent I was unable to retrieve it as it would slip out of the basket each time I tried to pull it down the ureter. I therefore tried the Bigopsy device but this was not able to grasp the stent adequately. Identified a 3 prong grasper and was able to grasp the stent and drag it down into the access sheath and into the area  of the bladder. I therefore let go of the stent and removed the access sheath and ureteroscope and reinserted the cystoscope. I was able to then grasp the tip of the double-J stent and directed out through the urethral meatus. A guidewire was then passed up through the double-J stent and into the  area the renal pelvis past the stone. I removed the stent, reinserted the access sheath and proceeded with ureteroscopy and laser lithotripsy of the proximal ureteral stone.  I was able to visualize the stone with ureteroscope. The 200  holmium laser fiber was used to fragment the stone. This required a great deal of time using various settings in order to fragment the stone. I was able to see grasp some stone fragments and extract them however due to the large number of fragments I elected rather to fragment each of these in situ to a point where they would pass easily down the ureter. Once this was complete I was able to pass the scope up the ureter and into the area of the capacious renal pelvis. I then withdrew the scope under direct vision and noted no ureteral injury or perforation. Small stone fragments were identified within the ureter but due to the large number of fragments I did not try to extract each of these. I removed the ureteroscope and the access sheath. Over the safety guidewire I then backloaded the cystoscope and passed the stent over the guidewire into the area of the renal pelvis. As the guidewire was removed good curl was noted in the renal pelvis. The bladder was drained and the cystoscope was then removed. The patient tolerated the procedure well no intraoperative complications.  PLAN OF CARE: Discharge to home after PACU  PATIENT DISPOSITION:  PACU - hemodynamically stable.

## 2012-11-27 NOTE — Anesthesia Preprocedure Evaluation (Signed)
Anesthesia Evaluation  Patient identified by MRN, date of birth, ID band Patient awake    Reviewed: Allergy & Precautions, H&P , NPO status , Patient's Chart, lab work & pertinent test results, reviewed documented beta blocker date and time   Airway Mallampati: II TM Distance: >3 FB Neck ROM: Full    Dental  (+) Dental Advisory Given and Teeth Intact   Pulmonary neg pulmonary ROS,  breath sounds clear to auscultation        Cardiovascular hypertension, Pt. on medications and Pt. on home beta blockers + Peripheral Vascular Disease and +CHF + dysrhythmias Atrial Fibrillation + Valvular Problems/Murmurs MR Rhythm:Regular Rate:Normal  Echo 01/2008 -  The left ventricle was moderately dilated. Overall left  ventricular systolic function was at the lower limits of normal. Left ventricular ejection fraction was estimated, range being 50 % to 55 %. There was no diagnostic evidence of left ventricular regional wall motion abnormalities. Left ventricular wall thickness was mildly increased.  -  There was mild aortic valvular regurgitation. The aortic regurgitation jet was central.  -  There was mild to moderate aortic root dilatation. There was mild fibrocalcific change of the aortic root. Ascending aortic dimension was 55 mm. There was accoustic shadowing - likely moderate atheroma of the descending aorta, although could correspond to reported thoracic aneurysm repair site. This region could be imaged angiographically if further definition needed.  -  There was mild mitral annular calcification. There was moderate mitral valvular regurgitation. The mitral regurgitation jet was eccentric. The effective orifice of mitral regurgitation by proximal isovelocity surface area was 0.14 cm^2. The volume of mitral regurgitation by proximal isovelocity surface area was 20 cc.  -  The left atrium was moderately to markedly dilated.  -  Right ventricular size was at  the upper limits of normal.  -  There was mild to moderate tricuspid valvular regurgitation.  -  The right atrium was moderately dilated.    Neuro/Psych negative neurological ROS  negative psych ROS   GI/Hepatic negative GI ROS, Neg liver ROS,   Endo/Other  negative endocrine ROS  Renal/GU CRF and Renal InsufficiencyRenal disease     Musculoskeletal negative musculoskeletal ROS (+)   Abdominal   Peds  Hematology negative hematology ROS (+)   Anesthesia Other Findings   Reproductive/Obstetrics                           Anesthesia Physical  Anesthesia Plan  ASA: III  Anesthesia Plan: General   Post-op Pain Management:    Induction: Intravenous  Airway Management Planned: LMA  Additional Equipment:   Intra-op Plan:   Post-operative Plan: Extubation in OR  Informed Consent: I have reviewed the patients History and Physical, chart, labs and discussed the procedure including the risks, benefits and alternatives for the proposed anesthesia with the patient or authorized representative who has indicated his/her understanding and acceptance.   Dental advisory given  Plan Discussed with: CRNA  Anesthesia Plan Comments:         Anesthesia Quick Evaluation

## 2012-11-27 NOTE — Anesthesia Postprocedure Evaluation (Signed)
  Anesthesia Post-op Note  Patient: [name]  Procedure(s) Performed: Procedure(s) (LRB): CYSTOSCOPY,LEFT URETEROSCOPY WITH LASER LITHOTRIPSY/REMOVAL OF MIGRATED STENT, PLACEMENT OF LEFT STENT (Left) HOLMIUM LASER APPLICATION (Left)  Patient Location: PACU  Anesthesia Type: General  Level of Consciousness: awake and alert   Airway and Oxygen Therapy: Patient Spontanous Breathing  Post-op Pain: mild  Post-op Assessment: Post-op Vital signs reviewed, Patient's Cardiovascular Status Stable, Respiratory Function Stable, Patent Airway and No signs of Nausea or vomiting  Last Vitals:  Filed Vitals:   11/27/12 1415  BP: 120/77  Pulse: 77  Temp: 36.4 C  Resp: 20    Post-op Vital Signs: stable   Complications: No apparent anesthesia complications

## 2012-11-27 NOTE — Interval H&P Note (Signed)
History and Physical Interval Note:  11/27/2012 10:20 AM  [name]  has presented today for surgery, with the diagnosis of Migrated Left Ureteral Stent/Left UPJ Stone  The various methods of treatment have been discussed with the patient and family. After consideration of risks, benefits and other options for treatment, the patient has consented to  Procedure(s): CYSTOSCOPY,LEFT URETEROSCOPY WITH LASER LITHOTRIPSY/REMOVAL OF MIGRATED STENT, PLACEMENT OF LEFT STENT (Left) HOLMIUM LASER APPLICATION (Left) as a surgical intervention .  The patient's history has been reviewed, patient examined, no change in status, stable for surgery.  I have reviewed the patient's chart and labs.  Questions were answered to the patient's satisfaction.     Garnett Farm

## 2012-11-28 ENCOUNTER — Encounter (HOSPITAL_COMMUNITY): Payer: Self-pay | Admitting: Urology

## 2012-11-29 NOTE — Transfer of Care (Signed)
Immediate Anesthesia Transfer of Care Note  Patient: [name]  Procedure(s) Performed: Procedure(s): CYSTOSCOPY,LEFT URETEROSCOPY WITH LASER LITHOTRIPSY/REMOVAL OF MIGRATED STENT, PLACEMENT OF LEFT STENT (Left) HOLMIUM LASER APPLICATION (Left)  Patient Location: PACU  Anesthesia Type:General  Level of Consciousness: awake, alert  and oriented  Airway & Oxygen Therapy: Patient Spontanous Breathing  Post-op Assessment: Report given to PACU RN, Post -op Vital signs reviewed and stable and Patient moving all extremities  Post vital signs: Reviewed and stable  Complications: No apparent anesthesia complications

## 2012-12-05 ENCOUNTER — Other Ambulatory Visit: Payer: Self-pay | Admitting: Urology

## 2012-12-05 ENCOUNTER — Telehealth: Payer: Self-pay

## 2012-12-05 NOTE — Telephone Encounter (Signed)
Please advise in absence to DR RR

## 2012-12-05 NOTE — Telephone Encounter (Signed)
Chastity called stating that patient is having a second procedure similar to recent procedure on 12/29/2012.  Patient was instructed to hold coumadin, prior to first procedure.  Patient stated to Dr. Warner Mccreedy office he wants to hold Coumadin until next procedure on 09.05.2014.  Patient needs to be instructed.  Chastity with Dr. Warner Mccreedy office requested documentation regarding the patient Coumadin instructions.

## 2012-12-05 NOTE — Telephone Encounter (Signed)
Coumdin is to be held for 5 days prior to the procedure and started ASAP post procedure. Holding for 3 weeks is not in the patient's best interest with increased risk for CVA with atrial fibrillation. If surgeon wishes to hold coumadin for that long, he will need to have Lovenox coverage until day of procedure.

## 2012-12-06 NOTE — Telephone Encounter (Signed)
.  left message to have OFFICE REP return my call.

## 2012-12-07 NOTE — Telephone Encounter (Signed)
Spoke to pt rep Chasitiy to advise results/instructions. Pt rep understood and will advise the pt of instructions per pt was the person wanting to stop the medication per stated " I am on a low dose I dont think it would be a big deal to stop it" Chasity advised she will inform the pt the instructions, and educate the importance to continue this dosage as directed, she will advise pt if he has any further concerns to call our office

## 2012-12-15 ENCOUNTER — Encounter (HOSPITAL_COMMUNITY): Payer: Self-pay | Admitting: Pharmacy Technician

## 2012-12-21 ENCOUNTER — Encounter (HOSPITAL_COMMUNITY): Payer: Self-pay

## 2012-12-21 ENCOUNTER — Encounter (HOSPITAL_COMMUNITY)
Admission: RE | Admit: 2012-12-21 | Discharge: 2012-12-21 | Disposition: A | Discharge status: Home / Self Care | Payer: Medicare Other | Source: Ambulatory Visit | Attending: Urology | Admitting: Urology

## 2012-12-21 DIAGNOSIS — N201 Calculus of ureter: Secondary | ICD-10-CM | POA: Insufficient documentation

## 2012-12-21 DIAGNOSIS — Z01812 Encounter for preprocedural laboratory examination: Secondary | ICD-10-CM | POA: Insufficient documentation

## 2012-12-21 LAB — CBC
HCT: 37.2 % — ABNORMAL LOW (ref 39.0–52.0)
Hemoglobin: 12 g/dL — ABNORMAL LOW (ref 13.0–17.0)
MCH: 31.5 pg (ref 26.0–34.0)
MCHC: 32.3 g/dL (ref 30.0–36.0)
MCV: 97.6 fL (ref 78.0–100.0)
Platelets: 160 10*3/uL (ref 150–400)
RBC: 3.81 MIL/uL — ABNORMAL LOW (ref 4.22–5.81)
RDW: 13.6 % (ref 11.5–15.5)
WBC: 5.6 10*3/uL (ref 4.0–10.5)

## 2012-12-21 LAB — BASIC METABOLIC PANEL
BUN: 42 mg/dL — ABNORMAL HIGH (ref 6–23)
CO2: 26 mEq/L (ref 19–32)
Calcium: 9.4 mg/dL (ref 8.4–10.5)
Chloride: 103 mEq/L (ref 96–112)
Creatinine, Ser: 2.4 mg/dL — ABNORMAL HIGH (ref 0.50–1.35)
GFR calc Af Amer: 27 mL/min — ABNORMAL LOW (ref >90)
GFR calc non Af Amer: 23 mL/min — ABNORMAL LOW (ref >90)
Glucose, Bld: 100 mg/dL — ABNORMAL HIGH (ref 70–99)
Potassium: 5.5 mEq/L — ABNORMAL HIGH (ref 3.5–5.1)
Sodium: 138 mEq/L (ref 135–145)

## 2012-12-21 LAB — APTT: aPTT: 43 seconds — ABNORMAL HIGH (ref 24–37)

## 2012-12-21 LAB — PROTIME-INR
INR: 1.84 — ABNORMAL HIGH (ref 0.00–1.49)
Prothrombin Time: 20.7 seconds — ABNORMAL HIGH (ref 11.6–15.2)

## 2012-12-21 NOTE — Patient Instructions (Addendum)
YOUR SURGERY IS SCHEDULED AT [facility]  ON:  Friday 9/5  REPORT TO [facility] AT:  8:30 AM      PHONE # FOR SHORT STAY IS [phone]  DO NOT EAT OR DRINK ANYTHING AFTER MIDNIGHT THE NIGHT BEFORE YOUR SURGERY.  YOU MAY BRUSH YOUR TEETH, RINSE OUT YOUR MOUTH--BUT NO WATER, NO FOOD, NO CHEWING GUM, NO MINTS, NO CANDIES, NO CHEWING TOBACCO.  PLEASE TAKE THE FOLLOWING MEDICATIONS THE AM OF YOUR SURGERY WITH A FEW SIPS OF WATER:   AMLODIPINE, DIGOXIN, METOPROLOL    DO NOT BRING VALUABLES, MONEY, CREDIT CARDS.  DO NOT WEAR JEWELRY, MAKE-UP, NAIL POLISH AND NO METAL PINS OR CLIPS IN YOUR HAIR. CONTACT LENS, DENTURES / PARTIALS, GLASSES SHOULD NOT BE WORN TO SURGERY AND IN MOST CASES-HEARING AIDS WILL NEED TO BE REMOVED.  BRING YOUR GLASSES CASE, ANY EQUIPMENT NEEDED FOR YOUR CONTACT LENS. FOR PATIENTS ADMITTED TO THE HOSPITAL--CHECK OUT TIME THE DAY OF DISCHARGE IS 11:00 AM.  ALL INPATIENT ROOMS ARE PRIVATE - WITH BATHROOM, TELEPHONE, TELEVISION AND WIFI INTERNET.  IF YOU ARE BEING DISCHARGED THE SAME DAY OF YOUR SURGERY--YOU CAN NOT DRIVE YOURSELF HOME--AND SHOULD NOT GO HOME ALONE BY TAXI OR BUS.  NO DRIVING OR OPERATING MACHINERY FOR 24 HOURS FOLLOWING ANESTHESIA / PAIN MEDICATIONS.  PLEASE MAKE ARRANGEMENTS FOR SOMEONE TO BE WITH YOU AT HOME THE FIRST 24 HOURS AFTER SURGERY. RESPONSIBLE DRIVER'S NAME   DAVID Archibald - PT'S BROTHER                                               PHONE #   [phone]                           FAILURE TO FOLLOW THESE INSTRUCTIONS MAY RESULT IN THE CANCELLATION OF YOUR SURGERY.   PATIENT SIGNATURE_________________________________

## 2012-12-21 NOTE — Pre-Procedure Instructions (Signed)
PAM CALLED FROM DR. OTTELIN'S OFFICE - STATES DR. [name] DID REVIEW PT'S ABNORMAL PREOP LABS -NO ACTION NEEDED.

## 2012-12-21 NOTE — Pre-Procedure Instructions (Addendum)
EKG AND CXR REPORTS IN EPIC FROM 10/24/12. LAST CARDIOLOGY OFFICE VISIT DR. Dietrich Pates 02/03/12 IN EPIC - PT DENIES ANY CHANGES IN HEART HX - NO CHEST PAIN.

## 2012-12-21 NOTE — Pre-Procedure Instructions (Signed)
PT'S POTASSIUM, BUN, CREAT ELEVATED - PT, PTT ELEVATED- PT STILL ON COUMADIN-SAID HE IS TO STOP COUMADIN 8/30 FOR SURGERY 9/5.  BMET AND PT, PTT REPORTS FAXED TO DR. Margrett Rud OFFICE--PAM GIBSON AT THE OFFICE NOTIFIED PT HAS ELEVATED POTASSIUM, BUN, CREAT - THAT PT HAS HX OF CHRONIC KIDNEY INSUFFIENCY - BUT TO PLEASE HAVE DR. [name] REVIEW.  PAM STATES DR. [name] NOT IN OFFICE THIS WEEK.  PAM WILL SHOW LABS TO NURSE PRACTITIONER.

## 2012-12-28 DIAGNOSIS — N201 Calculus of ureter: Secondary | ICD-10-CM | POA: Diagnosis present

## 2012-12-28 NOTE — H&P (Signed)
History of Present Illness  Bilateral UPJ stones: The patient was found to have extremely severe bilateral hydronephrosis and underwent double-J stent placement bilaterally on 10/24/12.  His hydronephrosis was secondary to bilateral UPJ stones.  His stent had migrated up the ureter on the left-hand side so he was taken to the operating room where I removed his left ureteral stent and proceeded to treat his left UPJ stone with laser lithotripsy. A new stent was left in place. A followup KUB revealed both stents still in good position with no residual stone fragments seen on the left-hand side.   Past Medical History Problems  1. History of  Arthritis V13.4 2. History of  Atrial Fibrillation 427.31 3. History of  Chronic Kidney Disease, Stage 4 585.4 4. History of  Glaucoma 365.9 5. History of  Gout 274.9 6. History of  Hypertension 401.9  Surgical History Problems  1. History of  Cholecystectomy 2. History of  Cystoscopy With Insertion Of Ureteral Stent Bilateral 3. History of  Eye Surgery 4. History of  Lithotripsy 5. History of  Surgery For Ruptured Abdominal Aortic Aneurysm  Current Meds 1. AmLODIPine Besylate TABS; Therapy: (Recorded:16Jun2014) to 2. Amoxicillin 500 MG Oral Capsule; Therapy: (Recorded:16Jul2014) to 3. Digoxin TABS; Therapy: (Recorded:16Jun2014) to 4. Furosemide 40 MG Oral Tablet; Therapy: (Recorded:16Jun2014) to 5. Losartan Potassium 50 MG Oral Tablet; Therapy: (Recorded:16Jun2014) to 6. Metoprolol Tartrate TABS; Therapy: (Recorded:16Jun2014) to 7. Simvastatin 20 MG Oral Tablet; Therapy: (Recorded:16Jun2014) to 8. SPS SUSP; Therapy: (Recorded:16Jun2014) to 9. Vitamin B-12 TABS; Therapy: (Recorded:16Jun2014) to 10. Warfarin Sodium 2.5 MG Oral Tablet; Therapy: (Recorded:16Jun2014) to  Allergies Medication  1. Sulfa Drugs  Family History Problems  1. Family history of  Father Deceased At Age 51 2. Family history of  Mother Deceased At Age 48 3. Family history  of  Nephrolithiasis  Social History Problems  1. Caffeine Use 3 cups coffee 2. Marital History - Single 3. Occupation: Retired Denied  4. History of  Alcohol Use 5. History of  Tobacco Use  Review of Systems Genitourinary, constitutional, skin, eye, otolaryngeal, hematologic/lymphatic, cardiovascular, pulmonary, endocrine, musculoskeletal, gastrointestinal, neurological and psychiatric system(s) were reviewed and pertinent findings if present are noted.  Genitourinary: urinary frequency and nocturia.  Constitutional: feeling tired (fatigue).  ENT: sinus problems.  Respiratory: shortness of breath.  Musculoskeletal: joint pain.   Vitals Vital Signs  Blood Pressure: 117 / 78 Temperature: 96.8 F Heart Rate: 74  Physical Exam Constitutional: Well nourished and well developed . No acute distress.  ENT:. The ears and nose are normal in appearance.  Neck: The appearance of the neck is normal and no neck mass is present.  Pulmonary: No respiratory distress and normal respiratory rhythm and effort.  Cardiovascular: Heart rate and rhythm are normal . No peripheral edema.  Abdomen: The abdomen is soft and nontender. No masses are palpated. No CVA tenderness. No hernias are palpable. No hepatosplenomegaly noted.  Rectal: Rectal exam demonstrates normal sphincter tone, no tenderness and no masses. Estimated prostate size is 2+. The prostate has no nodularity and is not tender. The left seminal vesicle is nonpalpable. The right seminal vesicle is nonpalpable. The perineum is normal on inspection.  Genitourinary: Examination of the penis demonstrates no discharge, no masses, no lesions and a normal meatus. The scrotum is without lesions. The right epididymis is palpably normal and non-tender. The left epididymis is palpably normal and non-tender. The right testis is non-tender and without masses. The left testis is non-tender and without masses.  Lymphatics: The femoral  and inguinal nodes are not  enlarged or tender.  Skin: Normal skin turgor, no visible rash and no visible skin lesions.  Neuro/Psych:. Mood and affect are appropriate.   Assessment Assessed  1. Hydronephrosis On The Left 591 2. Hydronephrosis On The Right 591 3. Proximal Ureteral Stone On The Right 592.1   I discussed with the patient the planned procedure in detail.  We discussed the fact that it would be identical to his previous procedure without the need to retrieve his migrated stent from the ureter.  I would remove his left ureteral stent at that time.  He will need to come off of his Coumadin again.   Plan  1.  He will stop Coumadin 5 days prior to his planned procedure. 2.  He will be scheduled for left ureteral stent removal and right ureteroscopy with laser lithotripsy.

## 2012-12-29 ENCOUNTER — Encounter (HOSPITAL_COMMUNITY): Payer: Self-pay | Admitting: Anesthesiology

## 2012-12-29 ENCOUNTER — Encounter (HOSPITAL_COMMUNITY)
Admission: RE | Disposition: A | Discharge status: Home / Self Care | Payer: Self-pay | Source: Ambulatory Visit | Attending: Urology

## 2012-12-29 ENCOUNTER — Ambulatory Visit (HOSPITAL_COMMUNITY)
Admission: RE | Admit: 2012-12-29 | Discharge: 2012-12-29 | Disposition: A | Discharge status: Home / Self Care | Payer: Medicare Other | Source: Ambulatory Visit | Attending: Urology | Admitting: Urology

## 2012-12-29 ENCOUNTER — Ambulatory Visit (HOSPITAL_COMMUNITY): Payer: Medicare Other | Admitting: Anesthesiology

## 2012-12-29 ENCOUNTER — Encounter (HOSPITAL_COMMUNITY): Payer: Self-pay | Admitting: *Deleted

## 2012-12-29 ENCOUNTER — Ambulatory Visit (HOSPITAL_COMMUNITY): Payer: Medicare Other

## 2012-12-29 DIAGNOSIS — N185 Chronic kidney disease, stage 5: Secondary | ICD-10-CM | POA: Insufficient documentation

## 2012-12-29 DIAGNOSIS — N201 Calculus of ureter: Secondary | ICD-10-CM | POA: Diagnosis present

## 2012-12-29 DIAGNOSIS — Z79899 Other long term (current) drug therapy: Secondary | ICD-10-CM | POA: Insufficient documentation

## 2012-12-29 DIAGNOSIS — I4891 Unspecified atrial fibrillation: Secondary | ICD-10-CM | POA: Insufficient documentation

## 2012-12-29 DIAGNOSIS — Z7901 Long term (current) use of anticoagulants: Secondary | ICD-10-CM | POA: Insufficient documentation

## 2012-12-29 DIAGNOSIS — I739 Peripheral vascular disease, unspecified: Secondary | ICD-10-CM | POA: Insufficient documentation

## 2012-12-29 DIAGNOSIS — I120 Hypertensive chronic kidney disease with stage 5 chronic kidney disease or end stage renal disease: Secondary | ICD-10-CM | POA: Insufficient documentation

## 2012-12-29 DIAGNOSIS — N1330 Unspecified hydronephrosis: Secondary | ICD-10-CM | POA: Insufficient documentation

## 2012-12-29 HISTORY — PX: HOLMIUM LASER APPLICATION: SHX5852

## 2012-12-29 HISTORY — PX: CYSTOSCOPY WITH URETEROSCOPY AND STENT PLACEMENT: SHX6377

## 2012-12-29 LAB — PROTIME-INR
INR: 1.27 (ref 0.00–1.49)
Prothrombin Time: 15.6 seconds — ABNORMAL HIGH (ref 11.6–15.2)

## 2012-12-29 SURGERY — CYSTOURETEROSCOPY, WITH STENT INSERTION
Anesthesia: General | Laterality: Right | Wound class: Clean Contaminated

## 2012-12-29 MED ORDER — PHENYLEPHRINE HCL 10 MG/ML IJ SOLN
INTRAMUSCULAR | Status: DC | PRN
  Administered 2012-12-29: 40 ug via INTRAVENOUS

## 2012-12-29 MED ORDER — PHENAZOPYRIDINE HCL 200 MG PO TABS
200.0000 mg | ORAL_TABLET | Freq: Once | ORAL | Status: AC
  Administered 2012-12-29: 200 mg via ORAL
  Filled 2012-12-29: qty 1

## 2012-12-29 MED ORDER — LACTATED RINGERS IV SOLN
INTRAVENOUS | Status: DC | PRN
  Administered 2012-12-29 (×2): via INTRAVENOUS

## 2012-12-29 MED ORDER — METOPROLOL TARTRATE 100 MG PO TABS
100.0000 mg | ORAL_TABLET | Freq: Two times a day (BID) | ORAL | Status: DC
  Administered 2012-12-29: 100 mg via ORAL
  Filled 2012-12-29 (×4): qty 1

## 2012-12-29 MED ORDER — PHENAZOPYRIDINE HCL 100 MG PO TABS: 100.0000 mg | ORAL_TABLET | Freq: Three times a day (TID) | ORAL | Status: DC | PRN

## 2012-12-29 MED ORDER — NALOXONE HCL 0.4 MG/ML IJ SOLN
INTRAMUSCULAR | Status: DC | PRN
  Administered 2012-12-29 (×2): .08 ug via INTRAVENOUS

## 2012-12-29 MED ORDER — FENTANYL CITRATE 0.05 MG/ML IJ SOLN
INTRAMUSCULAR | Status: DC | PRN
  Administered 2012-12-29 (×4): 50 ug via INTRAVENOUS
  Administered 2012-12-29 (×2): 25 ug via INTRAVENOUS
  Administered 2012-12-29: 50 ug via INTRAVENOUS
  Administered 2012-12-29 (×3): 25 ug via INTRAVENOUS
  Administered 2012-12-29: 50 ug via INTRAVENOUS
  Administered 2012-12-29: 25 ug via INTRAVENOUS

## 2012-12-29 MED ORDER — PROPOFOL 10 MG/ML IV BOLUS
INTRAVENOUS | Status: DC | PRN
  Administered 2012-12-29: 150 mg via INTRAVENOUS

## 2012-12-29 MED ORDER — HYDROCODONE-ACETAMINOPHEN 10-325 MG PO TABS: 1.0000 | ORAL_TABLET | ORAL | Status: DC | PRN

## 2012-12-29 MED ORDER — CIPROFLOXACIN IN D5W 200 MG/100ML IV SOLN
200.0000 mg | INTRAVENOUS | Status: AC
  Administered 2012-12-29: 200 mg via INTRAVENOUS
  Filled 2012-12-29: qty 100

## 2012-12-29 MED ORDER — SODIUM CHLORIDE 0.9 % IR SOLN
Status: DC | PRN
  Administered 2012-12-29: 10000 mL

## 2012-12-29 MED ORDER — DEXAMETHASONE SODIUM PHOSPHATE 10 MG/ML IJ SOLN
INTRAMUSCULAR | Status: DC | PRN
  Administered 2012-12-29: 10 mg via INTRAVENOUS

## 2012-12-29 MED ORDER — IOHEXOL 300 MG/ML  SOLN
INTRAMUSCULAR | Status: DC | PRN
  Administered 2012-12-29: 60 mL via URETHRAL

## 2012-12-29 MED ORDER — EPHEDRINE SULFATE 50 MG/ML IJ SOLN
INTRAMUSCULAR | Status: DC | PRN
  Administered 2012-12-29: 5 mg via INTRAVENOUS

## 2012-12-29 MED ORDER — TAMSULOSIN HCL 0.4 MG PO CAPS
0.4000 mg | ORAL_CAPSULE | Freq: Once | ORAL | Status: AC
  Administered 2012-12-29: 0.4 mg via ORAL
  Filled 2012-12-29: qty 1

## 2012-12-29 MED ORDER — SODIUM CHLORIDE 0.9 % IV SOLN
10.0000 mg | INTRAVENOUS | Status: DC | PRN
  Administered 2012-12-29: 10 ug/min via INTRAVENOUS

## 2012-12-29 MED ORDER — ONDANSETRON HCL 4 MG/2ML IJ SOLN
INTRAMUSCULAR | Status: DC | PRN
  Administered 2012-12-29: 4 mg via INTRAVENOUS

## 2012-12-29 MED ORDER — LIDOCAINE HCL (CARDIAC) 20 MG/ML IV SOLN
INTRAVENOUS | Status: DC | PRN
  Administered 2012-12-29: 40 mg via INTRAVENOUS

## 2012-12-29 MED ORDER — SODIUM CHLORIDE 0.9 % IV SOLN
INTRAVENOUS | Status: DC
  Administered 2012-12-29: 1000 mL via INTRAVENOUS

## 2012-12-29 SURGICAL SUPPLY — 20 items
BAG URO CATCHER STRL LF (DRAPE) ×3 IMPLANT
BASKET ZERO TIP NITINOL 2.4FR (BASKET) ×1 IMPLANT
BSKT STON RTRVL ZERO TP 2.4FR (BASKET) ×2
CATH INTERMIT  6FR 70CM (CATHETERS) ×1 IMPLANT
CATH URET 5FR 28IN OPEN ENDED (CATHETERS) ×3 IMPLANT
CLOTH BEACON ORANGE TIMEOUT ST (SAFETY) ×3 IMPLANT
DRAPE CAMERA CLOSED 9X96 (DRAPES) ×3 IMPLANT
FIBER LASER TRAC TIP (UROLOGICAL SUPPLIES) ×1 IMPLANT
GLOVE SURG SS PI 8.0 STRL IVOR (GLOVE) ×9 IMPLANT
GOWN PREVENTION PLUS XLARGE (GOWN DISPOSABLE) ×2 IMPLANT
GOWN STRL REIN XL XLG (GOWN DISPOSABLE) ×5 IMPLANT
GUIDEWIRE ANG ZIPWIRE 038X150 (WIRE) ×2 IMPLANT
GUIDEWIRE STR DUAL SENSOR (WIRE) ×1 IMPLANT
LASER FIBER DISP (UROLOGICAL SUPPLIES) ×2 IMPLANT
MANIFOLD NEPTUNE II (INSTRUMENTS) ×3 IMPLANT
MARKER SKIN DUAL TIP RULER LAB (MISCELLANEOUS) ×2 IMPLANT
PACK CYSTO (CUSTOM PROCEDURE TRAY) ×3 IMPLANT
SHEATH ACCESS URETERAL 38CM (SHEATH) ×2 IMPLANT
STENT RETRO 6FR PIC/PGM (STENTS) ×1 IMPLANT
TUBING CONNECTING 10 (TUBING) ×3 IMPLANT

## 2012-12-29 NOTE — Anesthesia Preprocedure Evaluation (Signed)
Anesthesia Evaluation  Patient identified by MRN, date of birth, ID band Patient awake  General Assessment Comment:1. History of  Arthritis  2. History of  Atrial Fibrillation  3. History of  Chronic Kidney Disease, Stage 4  4. History of  Glaucoma  5. History of  Gout  6. History of  Hypertension    Reviewed: Allergy & Precautions, H&P , NPO status , Patient's Chart, lab work & pertinent test results  Airway Mallampati: II TM Distance: >3 FB Neck ROM: Full    Dental no notable dental hx.    Pulmonary neg pulmonary ROS,  breath sounds clear to auscultation  Pulmonary exam normal       Cardiovascular Exercise Tolerance: Good hypertension, Pt. on medications and Pt. on home beta blockers + Peripheral Vascular Disease and +CHF negative cardio ROS  + dysrhythmias Atrial Fibrillation Rhythm:Regular Rate:Normal     Neuro/Psych  Neuromuscular disease negative psych ROS   GI/Hepatic negative GI ROS, Neg liver ROS,   Endo/Other  negative endocrine ROS  Renal/GU Renal InsufficiencyRenal disease  negative genitourinary   Musculoskeletal negative musculoskeletal ROS (+)   Abdominal   Peds negative pediatric ROS (+)  Hematology negative hematology ROS (+)   Anesthesia Other Findings   Reproductive/Obstetrics negative OB ROS                           Anesthesia Physical Anesthesia Plan  ASA: III  Anesthesia Plan: General   Post-op Pain Management:    Induction: Intravenous  Airway Management Planned: LMA  Additional Equipment:   Intra-op Plan:   Post-operative Plan: Extubation in OR  Informed Consent: I have reviewed the patients History and Physical, chart, labs and discussed the procedure including the risks, benefits and alternatives for the proposed anesthesia with the patient or authorized representative who has indicated his/her understanding and acceptance.   Dental advisory  given  Plan Discussed with: CRNA  Anesthesia Plan Comments:         Anesthesia Quick Evaluation

## 2012-12-29 NOTE — Anesthesia Postprocedure Evaluation (Signed)
  Anesthesia Post-op Note  Patient: [name]  Procedure(s) Performed: Procedure(s) (LRB): CYSTOSCOPY WITH URETEROSCOPY AND STENT PLACEMENT, removal of right and left stents, retrogrades, right stent exchange (N/A) HOLMIUM LASER APPLICATION (Right)  Patient Location: PACU  Anesthesia Type: General  Level of Consciousness: awake and alert   Airway and Oxygen Therapy: Patient Spontanous Breathing  Post-op Pain: mild  Post-op Assessment: Post-op Vital signs reviewed, Patient's Cardiovascular Status Stable, Respiratory Function Stable, Patent Airway and No signs of Nausea or vomiting  Last Vitals:  Filed Vitals:   12/29/12 1539  BP: 126/67  Pulse: 68  Temp: 36.2 C  Resp: 18    Post-op Vital Signs: stable   Complications: No apparent anesthesia complications

## 2012-12-29 NOTE — Interval H&P Note (Signed)
History and Physical Interval Note:  12/29/2012 10:50 AM  [name]  has presented today for surgery, with the diagnosis of Right Ureteral Pelvic Junction Stone, Left and Right Stents  The various methods of treatment have been discussed with the patient and family. After consideration of risks, benefits and other options for treatment, the patient has consented to  Procedure(s) with comments: CYSTOSCOPY WITH URETEROSCOPY AND STENT PLACEMENT (N/A) - 2 hours req for this case  C-ARM CAMERA FLEX URETERO  POSSIBLE (RT) JJ STENT PLACEMENT (LFT) STENT REMOVAL HOLMIUM LASER APPLICATION (Right) - HLL OF (RT) URETERAL PELVIC JUNCTION STONE as a surgical intervention .  The patient's history has been reviewed, patient examined, no change in status, stable for surgery.  I have reviewed the patient's chart and labs.  Questions were answered to the patient's satisfaction.     Garnett Farm

## 2012-12-29 NOTE — Op Note (Signed)
PATIENT:  [name]  PRE-OPERATIVE DIAGNOSIS: 1.  Left ureteral stent. 2.  Right ureteral stone.  POST-OPERATIVE DIAGNOSIS: Same  PROCEDURE: 1.  Cystoscopy and removal of left ureteral stent. 2.  Removal of right ureteral stent. 3.  Right ureteroscopy and laser lithotripsy. 4.  Right ureteroscopic stone extraction. 5.  Right double-J stent placement. 6.  Right retrograde pyelogram with interpretation.  SURGEON:  Garnett Farm  INDICATION: [name] is a 79 year old male who developed massive bilateral hydronephrosis due to some obstruction from ureteral stones.  He had stents placed initially and then underwent treatment of his left ureteral stone with ureteroscopy and laser lithotripsy.  He is brought back to the operating room today for management of his right ureteral stone.  ANESTHESIA:  General  EBL:  Minimal  DRAINS: 6 [place], multilength stent in the right ureter  LOCAL MEDICATIONS USED:  None  SPECIMEN:  None  Description of procedure: After informed consent the patient was brought to the major OR, placed on the table and administered general anesthesia.  He was then moved to the dorsal lithotomy position and his genitalia was sterilely prepped and draped.  An official timeout was then performed.  Cystoscopy was initially performed with a 22 French rigid scope.  I entered the bladder and identified the stent exiting his left ureteral orifice.  This was grasped with alligator graspers and extracted without difficulty.  I then reinserted the scope and placed a 6 [place] open-ended catheter through the scope and into the right ureteral orifice.  I passed the guidewire up through the open-ended stent and up to the area of the stone but had a difficult time advancing it beyond this location.  I therefore left it in place.  I removed the cystoscope.  A ureteral access sheath was then passed over the guidewire and secured.  The guidewire and internal portion of the access  sheath were removed and I proceeded with ureteroscopy.  The 6 French flexible ureteroscope was then passed the ureteroscope through the access sheath and I was able to visualize the stone. I used a 200  holmium laser fiber to begin to fragment the stone.  It was a very large stone.  This proceeded without difficulty but it got to a point where I was having a difficult time advancing the scope to get to a a point to fragment The stone further so I used the nitinol basket to extract multiple stone fragments.  I then elected to evaluate the area with a retrograde pyelogram since I was not able to get a guidewire past the stone and it looked like the stent was located at the level of the stone preoperatively.  I therefore used full strength Omnipaque and performed a retrograde pyelogram.  The retrograde pyelogram was performed by injecting full strength contrast through the ureteroscope under direct fluoroscopic visualization.  This revealed a markedly dilated ureter proximal to the stone and the stone was noted to have a filling defect.  Contrast was getting past the stone but the ureter took a 180 turn medially at that location and then emptied into his very capacious renal pelvis.  The previously placed stent appeared to have been located at the level of the stone in the proximal ureter.  I then proceeded to fragment the stone further and eventually was able to advance the ureteroscope to a point where it was noted to be beyond the obstructing stone although there was a great deal of stone material still present.  I was able to pass a guidewire through the ureteroscope and left this in the renal pelvic region.  I then removed the ureteroscope and passed a second guidewire through the access sheath and then removed the access sheath.  This allowed the first guidewire to serve as a safety guidewire and a second guidewire as a working guidewire that I put the access sheath over again and then used the nitinol  basket to extract further stone fragments.  I intermittently used the laser to fragment further stone and ended up going through 3 laser fibers trying to break up the stone completely.  There was so much stone that I felt continued work in the ureter might be somewhat risky and there appeared to be a lot of scar tissue where the stone had previously been located.  I therefore elected to place a stent and return to remove the remaining stone that did not filter down the ureter with the stent in place.  I therefore removed the access sheath and ureteroscope and over the  safety guidewire I backloaded the cystoscope and passed the multilength stent over the guidewire but had a great deal of difficulty advancing it into the area of the renal pelvis and could only get it to an area just above the stone in the dilated proximal ureter.  I believed that this would allow adequate drainage.  I therefore left the stent in place with good curl in the bladder and removed the cystoscope.  The patient was awakened and taken to recovery room in stable and satisfactory condition.  He tolerated the procedure well with no intraoperative complications.  PLAN OF CARE: Discharge to home after PACU  PATIENT DISPOSITION:  PACU - hemodynamically stable.

## 2012-12-29 NOTE — Transfer of Care (Signed)
Immediate Anesthesia Transfer of Care Note  Patient: [name]  Procedure(s) Performed: Procedure(s) with comments: CYSTOSCOPY WITH URETEROSCOPY AND STENT PLACEMENT, removal of right and left stents, retrogrades, right stent exchange (N/A) - 2 hours req for this case  C-ARM CAMERA FLEX URETERO  POSSIBLE (RT) JJ STENT PLACEMENT (LFT) STENT REMOVAL HOLMIUM LASER APPLICATION (Right) - HLL OF (RT) URETERAL PELVIC JUNCTION STONE  Patient Location: PACU  Anesthesia Type:General  Level of Consciousness: sedated  Airway & Oxygen Therapy: Patient Spontanous Breathing and Patient connected to face mask oxygen  Post-op Assessment: Report given to PACU RN and Post -op Vital signs reviewed and stable  Post vital signs: Reviewed and stable  Complications: No apparent anesthesia complications

## 2013-01-01 ENCOUNTER — Encounter (HOSPITAL_COMMUNITY): Payer: Self-pay | Admitting: Urology

## 2013-01-09 ENCOUNTER — Other Ambulatory Visit: Payer: Self-pay | Admitting: Urology

## 2013-01-11 ENCOUNTER — Encounter (HOSPITAL_COMMUNITY): Payer: Self-pay | Admitting: *Deleted

## 2013-01-11 NOTE — Progress Notes (Signed)
PT HAD RECENT SURGERY AT [facility]  ( 12/29/12 ) AND NOW FOR ADDITIONAL SURGERY ON 01/29/13.  HE STATES HE DOES NOT WANT TO COME FOR ANOTHER PREOP VISIT - WANTS TO DO EVERYTHING THE DAY OF SURGERY.  STATES THERE HAVE BEEN NO CHANGES IN HIS MEDICATIONS OR IN HIS MEDICAL HISTORY - STATES DR. Margrett Rud OFFICE HAS ALREADY GIVEN HIM HIS INSTRUCTIONS FOR DAY OF SURGERY.  PREOP INSTRUCTIONS & CHLORHEXIDINE INSTRUCTIONS REVIEWED WITH PT BY PHONE.  PT STATES HE WILL BE STOPPING HIS COUMADIN ON 9/30 FOR SURGERY.

## 2013-01-29 ENCOUNTER — Encounter (HOSPITAL_COMMUNITY)
Admission: RE | Disposition: A | Discharge status: Home / Self Care | Payer: Self-pay | Source: Ambulatory Visit | Attending: Urology

## 2013-01-29 ENCOUNTER — Ambulatory Visit (HOSPITAL_COMMUNITY): Payer: Medicare Other

## 2013-01-29 ENCOUNTER — Encounter (HOSPITAL_COMMUNITY): Payer: Self-pay

## 2013-01-29 ENCOUNTER — Ambulatory Visit (HOSPITAL_COMMUNITY): Payer: Medicare Other | Admitting: Anesthesiology

## 2013-01-29 ENCOUNTER — Ambulatory Visit (HOSPITAL_COMMUNITY)
Admission: RE | Admit: 2013-01-29 | Discharge: 2013-01-29 | Disposition: A | Discharge status: Home / Self Care | Payer: Medicare Other | Source: Ambulatory Visit | Attending: Urology | Admitting: Urology

## 2013-01-29 ENCOUNTER — Encounter (HOSPITAL_COMMUNITY): Payer: Self-pay | Admitting: Anesthesiology

## 2013-01-29 DIAGNOSIS — N1330 Unspecified hydronephrosis: Secondary | ICD-10-CM | POA: Insufficient documentation

## 2013-01-29 DIAGNOSIS — Z79899 Other long term (current) drug therapy: Secondary | ICD-10-CM | POA: Insufficient documentation

## 2013-01-29 DIAGNOSIS — N184 Chronic kidney disease, stage 4 (severe): Secondary | ICD-10-CM | POA: Insufficient documentation

## 2013-01-29 DIAGNOSIS — I4891 Unspecified atrial fibrillation: Secondary | ICD-10-CM | POA: Insufficient documentation

## 2013-01-29 DIAGNOSIS — N201 Calculus of ureter: Secondary | ICD-10-CM | POA: Insufficient documentation

## 2013-01-29 DIAGNOSIS — I129 Hypertensive chronic kidney disease with stage 1 through stage 4 chronic kidney disease, or unspecified chronic kidney disease: Secondary | ICD-10-CM | POA: Insufficient documentation

## 2013-01-29 DIAGNOSIS — H409 Unspecified glaucoma: Secondary | ICD-10-CM | POA: Insufficient documentation

## 2013-01-29 DIAGNOSIS — M109 Gout, unspecified: Secondary | ICD-10-CM | POA: Insufficient documentation

## 2013-01-29 DIAGNOSIS — I739 Peripheral vascular disease, unspecified: Secondary | ICD-10-CM | POA: Insufficient documentation

## 2013-01-29 DIAGNOSIS — N135 Crossing vessel and stricture of ureter without hydronephrosis: Secondary | ICD-10-CM | POA: Insufficient documentation

## 2013-01-29 HISTORY — PX: HOLMIUM LASER APPLICATION: SHX5852

## 2013-01-29 HISTORY — PX: CYSTOSCOPY WITH RETROGRADE PYELOGRAM, URETEROSCOPY AND STENT PLACEMENT: SHX5789

## 2013-01-29 LAB — BASIC METABOLIC PANEL
BUN: 45 mg/dL — ABNORMAL HIGH (ref 6–23)
CO2: 23 mEq/L (ref 19–32)
Calcium: 9.4 mg/dL (ref 8.4–10.5)
Chloride: 106 mEq/L (ref 96–112)
Creatinine, Ser: 2.48 mg/dL — ABNORMAL HIGH (ref 0.50–1.35)
GFR calc Af Amer: 26 mL/min — ABNORMAL LOW (ref >90)
GFR calc non Af Amer: 22 mL/min — ABNORMAL LOW (ref >90)
Glucose, Bld: 115 mg/dL — ABNORMAL HIGH (ref 70–99)
Potassium: 4.7 mEq/L (ref 3.5–5.1)
Sodium: 140 mEq/L (ref 135–145)

## 2013-01-29 LAB — CBC
HCT: 39.6 % (ref 39.0–52.0)
Hemoglobin: 13.1 g/dL (ref 13.0–17.0)
MCH: 31.4 pg (ref 26.0–34.0)
MCHC: 33.1 g/dL (ref 30.0–36.0)
MCV: 95 fL (ref 78.0–100.0)
Platelets: 208 10*3/uL (ref 150–400)
RBC: 4.17 MIL/uL — ABNORMAL LOW (ref 4.22–5.81)
RDW: 13.9 % (ref 11.5–15.5)
WBC: 6.1 10*3/uL (ref 4.0–10.5)

## 2013-01-29 LAB — PROTIME-INR
INR: 1.24 (ref 0.00–1.49)
Prothrombin Time: 15.3 seconds — ABNORMAL HIGH (ref 11.6–15.2)

## 2013-01-29 SURGERY — CYSTOURETEROSCOPY, WITH RETROGRADE PYELOGRAM AND STENT INSERTION
Anesthesia: General | Laterality: Right | Wound class: Clean Contaminated

## 2013-01-29 MED ORDER — PROMETHAZINE HCL 25 MG/ML IJ SOLN: 6.2500 mg | INTRAMUSCULAR | Status: DC | PRN

## 2013-01-29 MED ORDER — IOHEXOL 300 MG/ML  SOLN
INTRAMUSCULAR | Status: DC | PRN
  Administered 2013-01-29: 12 mL

## 2013-01-29 MED ORDER — PHENYLEPHRINE HCL 10 MG/ML IJ SOLN
INTRAMUSCULAR | Status: DC | PRN
  Administered 2013-01-29 (×3): 80 ug via INTRAVENOUS
  Administered 2013-01-29: 160 ug via INTRAVENOUS

## 2013-01-29 MED ORDER — PHENAZOPYRIDINE HCL 200 MG PO TABS: 200.0000 mg | ORAL_TABLET | Freq: Three times a day (TID) | ORAL | Status: DC | PRN

## 2013-01-29 MED ORDER — 0.9 % SODIUM CHLORIDE (POUR BTL) OPTIME
TOPICAL | Status: DC | PRN
  Administered 2013-01-29: 1000 mL

## 2013-01-29 MED ORDER — ONDANSETRON HCL 4 MG/2ML IJ SOLN
INTRAMUSCULAR | Status: DC | PRN
  Administered 2013-01-29: 4 mg via INTRAMUSCULAR

## 2013-01-29 MED ORDER — MEPERIDINE HCL 50 MG/ML IJ SOLN: 6.2500 mg | INTRAMUSCULAR | Status: DC | PRN

## 2013-01-29 MED ORDER — OXYCODONE HCL 5 MG PO TABS: 5.0000 mg | ORAL_TABLET | Freq: Once | ORAL | Status: DC | PRN

## 2013-01-29 MED ORDER — PHENYLEPHRINE HCL 10 MG/ML IJ SOLN
10.0000 mg | INTRAVENOUS | Status: DC | PRN
  Administered 2013-01-29: 20 ug/min via INTRAVENOUS

## 2013-01-29 MED ORDER — HYDROMORPHONE HCL PF 1 MG/ML IJ SOLN: 0.2500 mg | INTRAMUSCULAR | Status: DC | PRN

## 2013-01-29 MED ORDER — LIDOCAINE HCL 2 % EX GEL
CUTANEOUS | Status: AC
  Filled 2013-01-29: qty 10

## 2013-01-29 MED ORDER — HYDROCODONE-ACETAMINOPHEN 10-325 MG PO TABS: 1.0000 | ORAL_TABLET | ORAL | Status: DC | PRN

## 2013-01-29 MED ORDER — CIPROFLOXACIN IN D5W 200 MG/100ML IV SOLN
200.0000 mg | INTRAVENOUS | Status: AC
  Administered 2013-01-29: 200 mg via INTRAVENOUS
  Filled 2013-01-29: qty 100

## 2013-01-29 MED ORDER — SODIUM CHLORIDE 0.9 % IR SOLN
Status: DC | PRN
  Administered 2013-01-29: 6000 mL via INTRAVESICAL

## 2013-01-29 MED ORDER — OXYCODONE HCL 5 MG/5ML PO SOLN
5.0000 mg | Freq: Once | ORAL | Status: DC | PRN
  Filled 2013-01-29: qty 5

## 2013-01-29 MED ORDER — KETAMINE HCL 50 MG/ML IJ SOLN
INTRAMUSCULAR | Status: DC | PRN
  Administered 2013-01-29: 25 mg via INTRAMUSCULAR

## 2013-01-29 MED ORDER — FENTANYL CITRATE 0.05 MG/ML IJ SOLN
INTRAMUSCULAR | Status: DC | PRN
  Administered 2013-01-29: 12.5 ug via INTRAVENOUS
  Administered 2013-01-29: 50 ug via INTRAVENOUS

## 2013-01-29 MED ORDER — SODIUM CHLORIDE 0.9 % IV SOLN
INTRAVENOUS | Status: DC
  Administered 2013-01-29: 1000 mL via INTRAVENOUS

## 2013-01-29 MED ORDER — EPHEDRINE SULFATE 50 MG/ML IJ SOLN
INTRAMUSCULAR | Status: DC | PRN
  Administered 2013-01-29: 7.5 mg via INTRAVENOUS

## 2013-01-29 MED ORDER — PROPOFOL 10 MG/ML IV BOLUS
INTRAVENOUS | Status: DC | PRN
  Administered 2013-01-29: 180 mg via INTRAVENOUS

## 2013-01-29 SURGICAL SUPPLY — 22 items
BAG URO CATCHER STRL LF (DRAPE) ×3 IMPLANT
BASKET ZERO TIP NITINOL 2.4FR (BASKET) ×1 IMPLANT
BSKT STON RTRVL ZERO TP 2.4FR (BASKET) ×2
CATH INTERMIT  6FR 70CM (CATHETERS) ×3 IMPLANT
CATH URET 5FR 28IN OPEN ENDED (CATHETERS) ×2 IMPLANT
CLOTH BEACON ORANGE TIMEOUT ST (SAFETY) ×3 IMPLANT
DRAPE CAMERA CLOSED 9X96 (DRAPES) ×3 IMPLANT
FIBER LASER FLEXIVA 200 (UROLOGICAL SUPPLIES) ×1 IMPLANT
FORCEPS BASKET TRICEP GRSP 2.4 (BASKET) ×1 IMPLANT
GLOVE BIOGEL M 8.0 STRL (GLOVE) ×3 IMPLANT
GLOVE SURG SS PI 8.0 STRL IVOR (GLOVE) ×3 IMPLANT
GOWN PREVENTION PLUS XLARGE (GOWN DISPOSABLE) ×3 IMPLANT
GOWN STRL REIN XL XLG (GOWN DISPOSABLE) ×3 IMPLANT
GUIDEWIRE ANG ZIPWIRE 038X150 (WIRE) ×1 IMPLANT
GUIDEWIRE STR DUAL SENSOR (WIRE) ×1 IMPLANT
MANIFOLD NEPTUNE II (INSTRUMENTS) ×3 IMPLANT
MARKER SKIN DUAL TIP RULER LAB (MISCELLANEOUS) ×2 IMPLANT
PACK CYSTO (CUSTOM PROCEDURE TRAY) ×3 IMPLANT
SHEATH ACCESS URETERAL 38CM (SHEATH) ×2 IMPLANT
STENT CONTOUR 7FRX28X.038 (STENTS) ×1 IMPLANT
TUBING CONNECTING 10 (TUBING) ×3 IMPLANT
WIRE COONS/BENSON .038X145CM (WIRE) ×2 IMPLANT

## 2013-01-29 NOTE — Anesthesia Postprocedure Evaluation (Signed)
Anesthesia Post Note  Patient: [name]  Procedure(s) Performed: Procedure(s) (LRB): CYSTOSCOPY WITH RIGHT URETEROSCOPY AND STENT PLACEMENT (Right) HOLMIUM LASER APPLICATION (N/A)  Anesthesia type: General  Patient location: PACU  Post pain: Pain level controlled  Post assessment: Post-op Vital signs reviewed  Last Vitals: BP 106/57  Pulse 82  Temp(Src) 36.2 C (Oral)  Resp 16  Ht 5\' 10"  (1.778 m)  Wt 184 lb 9.6 oz (83.734 kg)  BMI 26.49 kg/m2  SpO2 97%  Post vital signs: Reviewed  Level of consciousness: sedated  Complications: No apparent anesthesia complications

## 2013-01-29 NOTE — Transfer of Care (Signed)
Immediate Anesthesia Transfer of Care Note  Patient: [name]  Procedure(s) Performed: Procedure(s): CYSTOSCOPY WITH RIGHT URETEROSCOPY AND STENT PLACEMENT (Right) HOLMIUM LASER APPLICATION (N/A)  Patient Location: PACU  Anesthesia Type:General  Level of Consciousness: awake, alert , sedated and patient cooperative  Airway & Oxygen Therapy: Patient Spontanous Breathing and Patient connected to face mask oxygen  Post-op Assessment: Report given to PACU RN and Post -op Vital signs reviewed and stable  Post vital signs: Reviewed and stable  Complications: No apparent anesthesia complications

## 2013-01-29 NOTE — H&P (Signed)
History of Present Illness  Bilateral UPJ stones: The patient was found to have extremely severe bilateral hydronephrosis and underwent double-J stent placement bilaterally on 10/24/12. His hydronephrosis was secondary to bilateral UPJ stones.  His stent had migrated up the ureter on the left-hand side so he was taken to the operating room where I removed his left ureteral stent and proceeded to treat his left UPJ stone with laser lithotripsy. A new stent was left in place. A followup KUB revealed both stents still in good position with no residual stone fragments seen on the left-hand side.  Past Medical History  Problems  1. History of Arthritis V13.4  2. History of Atrial Fibrillation 427.31  3. History of Chronic Kidney Disease, Stage 4 585.4  4. History of Glaucoma 365.9  5. History of Gout 274.9  6. History of Hypertension 401.9  Surgical History  Problems  1. History of Cholecystectomy  2. History of Cystoscopy With Insertion Of Ureteral Stent Bilateral  3. History of Eye Surgery  4. History of Lithotripsy  5. History of Surgery For Ruptured Abdominal Aortic Aneurysm  Current Meds  1. AmLODIPine Besylate TABS; Therapy: (Recorded:16Jun2014) to  2. Amoxicillin 500 MG Oral Capsule; Therapy: (Recorded:16Jul2014) to  3. Digoxin TABS; Therapy: (Recorded:16Jun2014) to  4. Furosemide 40 MG Oral Tablet; Therapy: (Recorded:16Jun2014) to  5. Losartan Potassium 50 MG Oral Tablet; Therapy: (Recorded:16Jun2014) to  6. Metoprolol Tartrate TABS; Therapy: (Recorded:16Jun2014) to  7. Simvastatin 20 MG Oral Tablet; Therapy: (Recorded:16Jun2014) to  8. SPS SUSP; Therapy: (Recorded:16Jun2014) to  9. Vitamin B-12 TABS; Therapy: (Recorded:16Jun2014) to  10. Warfarin Sodium 2.5 MG Oral Tablet; Therapy: (Recorded:16Jun2014) to  Allergies  Medication  1. Sulfa Drugs  Family History  Problems  1. Family history of Father Deceased At Age 29  2. Family history of Mother Deceased At Age 60  3. Family  history of Nephrolithiasis  Social History  Problems  1. Caffeine Use  3 cups coffee  2. Marital History - Single  3. Occupation: Retired  Denied  4. History of Alcohol Use  5. History of Tobacco Use  Review of Systems  Genitourinary, constitutional, skin, eye, otolaryngeal, hematologic/lymphatic, cardiovascular, pulmonary, endocrine, musculoskeletal, gastrointestinal, neurological and psychiatric system(s) were reviewed and pertinent findings if present are noted.  Genitourinary: urinary frequency and nocturia.  Constitutional: feeling tired (fatigue).  ENT: sinus problems.  Respiratory: shortness of breath.  Musculoskeletal: joint pain.  Vitals  Vital Signs  Blood Pressure: 117 / 78  Temperature: 96.8 F  Heart Rate: 74  Physical Exam  Constitutional: Well nourished and well developed . No acute distress.  ENT:. The ears and nose are normal in appearance.  Neck: The appearance of the neck is normal and no neck mass is present.  Pulmonary: No respiratory distress and normal respiratory rhythm and effort.  Cardiovascular: Heart rate and rhythm are normal . No peripheral edema.  Abdomen: The abdomen is soft and nontender. No masses are palpated. No CVA tenderness. No hernias are palpable. No hepatosplenomegaly noted.  Rectal: Rectal exam demonstrates normal sphincter tone, no tenderness and no masses. Estimated prostate size is 2+. The prostate has no nodularity and is not tender. The left seminal vesicle is nonpalpable. The right seminal vesicle is nonpalpable. The perineum is normal on inspection.  Genitourinary: Examination of the penis demonstrates no discharge, no masses, no lesions and a normal meatus. The scrotum is without lesions. The right epididymis is palpably normal and non-tender. The left epididymis is palpably normal and non-tender. The right testis  is non-tender and without masses. The left testis is non-tender and without masses.  Lymphatics: The femoral and inguinal  nodes are not enlarged or tender.  Skin: Normal skin turgor, no visible rash and no visible skin lesions.  Neuro/Psych:. Mood and affect are appropriate.  Assessment  Assessed  1. Hydronephrosis On The Left 591  2. Hydronephrosis On The Right 591  3. Proximal Ureteral Stone On The Right 592.1  I discussed with the patient the planned procedure in detail. We discussed the fact that it would be identical to his previous procedure. He will need to come off of his Coumadin again.  Plan  1. He will stop Coumadin 5 days prior to his planned procedure.  2. He will be scheduled for repeat right ureteroscopy and treatment of the remaining stone in the mid-ureter.

## 2013-01-29 NOTE — Op Note (Signed)
PATIENT:  [name]  PRE-OPERATIVE DIAGNOSIS:  right Ureteral calculus  POST-OPERATIVE DIAGNOSIS: 1.Right ureteral calculus. 2. Inflammatory stricture of the right ureter.   PROCEDURE:  1. Cystoscopy with right retrograde pyelogram including interpretation. 2. Right ureteral stent removal 3. Right ureteroscopy and laser lithotripsy 4. Right ureteral stone extraction 5. Right double-J stent placement  SURGEON: Garnett Farm, MD  INDICATION: Mr. Rowe is an 79 year old male who was found to have bilateral ureteral obstruction due to stones. He had his left stone treated and Then I treated his right ureteral stone although it was very large and I was unable to completely eradicate all of the stone from his ureter and therefore I left a stent in place With the plan to return and remove the remaining stone.  ANESTHESIA:  General  EBL:  Minimal  DRAINS: 7 French, 28 cm double-J stent in the right ureter (no string)  SPECIMEN:  Urine for culture  DESCRIPTION OF PROCEDURE: The patient was taken to the major OR and placed on the table. General anesthesia was administered and then the patient was moved to the dorsal lithotomy position. The genitalia was sterilely prepped and draped. An official timeout was performed.  Initially the 22 French cystoscope with 12 lens was passed under direct vision. The bladder was then entered and fully inspected. It was noted be free of any tumors stones or inflammatory lesions. Ureteral orifices were of normal configuration and position. There was a stent exiting the right ureteral orifice. This was grasped with the alligator forceps and brought out through the urethral meatus. I then passed a 0.038 floppy tip sensor guidewire Up the ureter but I was unable to manipulate it past the stone. I therefore left in place and removed the cystoscope. The ureteral access sheath was then passed over the guidewire and the guidewire was removed. I injected full-strength  contrast through the access sheath in order to perform I right retrograde pyelogram.  The retrograde pyelogram was performed by injecting full-strength contrast while visualizing using direct C-arm fluoroscopy. This revealed the ureter taking a right angle turn just beyond a filling defect consistent with the stone that remained in his ureter. Just beyond this the ureter was markedly dilated and essentially the size of atypical renal pelvis. This portion of the ureter measured about 6 cm and then it appeared to be attached to his markedly dilated renal pelvis.I removed the inner portion of the ureteral access sheath and proceeded with ureteroscopy.  I passed a 6 [place] flexible ureteroscope through the access sheath and was able to identify several stone fragments which I grasped initially with a Nitinol basket. There were large stone portions left and I used the holmium laser. I chose a on the laser fiber and fragmented the stone further. I was able to grasp further portions of the stone using the Nitinol basket and 3 prong graspers. Eventually I was able to advance my scope beyond the area where the stone was located. A noted further stone in the proximal ureter and used the laser to fragment this as well. The remaining stone was grasped and extracted. No further stone remained and the scope was then advanced down the ureter under direct visualization.  What I noticed as I backed the scope down was that there appeared to be whitish tissue most consistent with long-standing inflammation from the stone. This area appeared to possibly be at risk for late stricture formation. I therefore elected to place a stent across this. I therefore  passed the guidewire through the scope and up into the area of the renal pelvis. I removed the ureteroscope and backloaded the cystoscope over the guidewire and then passed the stent over the guidewire, beyond the area where the stone had been located and into the  proximal ureter which was essentially the size of a typical renal pelvis. There was a curl noted in this location which was beyond where the stone had been and beyond the area of what appeared to be inflammatory changes of the ureter. I then drained the bladder and removed the cystoscope.  At the beginning of the operation when the cystoscope was inserted I obtained urine and sent this for culture and sensitivity.  PLAN OF CARE: Discharge to home after PACU  PATIENT DISPOSITION:  PACU - hemodynamically stable.

## 2013-01-29 NOTE — Anesthesia Preprocedure Evaluation (Signed)
Anesthesia Evaluation  Patient identified by MRN, date of birth, ID band Patient awake    Reviewed: Allergy & Precautions, H&P , NPO status , Patient's Chart, lab work & pertinent test results, reviewed documented beta blocker date and time   Airway Mallampati: II TM Distance: >3 FB Neck ROM: Full    Dental  (+) Dental Advisory Given and Teeth Intact   Pulmonary neg pulmonary ROS,  breath sounds clear to auscultation        Cardiovascular hypertension, Pt. on medications and Pt. on home beta blockers + Peripheral Vascular Disease and +CHF + dysrhythmias Atrial Fibrillation + Valvular Problems/Murmurs MR Rhythm:Regular Rate:Normal  Echo 01/2008 -  The left ventricle was moderately dilated. Overall left  ventricular systolic function was at the lower limits of normal. Left ventricular ejection fraction was estimated, range being 50 % to 55 %. There was no diagnostic evidence of left ventricular regional wall motion abnormalities. Left ventricular wall thickness was mildly increased.  -  There was mild aortic valvular regurgitation. The aortic regurgitation jet was central.  -  There was mild to moderate aortic root dilatation. There was mild fibrocalcific change of the aortic root. Ascending aortic dimension was 55 mm. There was accoustic shadowing - likely moderate atheroma of the descending aorta, although could correspond to reported thoracic aneurysm repair site. This region could be imaged angiographically if further definition needed.  -  There was mild mitral annular calcification. There was moderate mitral valvular regurgitation. The mitral regurgitation jet was eccentric. The effective orifice of mitral regurgitation by proximal isovelocity surface area was 0.14 cm^2. The volume of mitral regurgitation by proximal isovelocity surface area was 20 cc.  -  The left atrium was moderately to markedly dilated.  -  Right ventricular size was at  the upper limits of normal.  -  There was mild to moderate tricuspid valvular regurgitation.  -  The right atrium was moderately dilated.    Neuro/Psych negative neurological ROS  negative psych ROS   GI/Hepatic negative GI ROS, Neg liver ROS,   Endo/Other  negative endocrine ROS  Renal/GU CRF and Renal InsufficiencyRenal disease     Musculoskeletal negative musculoskeletal ROS (+)   Abdominal   Peds  Hematology negative hematology ROS (+)   Anesthesia Other Findings   Reproductive/Obstetrics                           Anesthesia Physical  Anesthesia Plan  ASA: III  Anesthesia Plan: General   Post-op Pain Management:    Induction: Intravenous  Airway Management Planned: LMA and Oral ETT  Additional Equipment:   Intra-op Plan:   Post-operative Plan: Extubation in OR  Informed Consent: I have reviewed the patients History and Physical, chart, labs and discussed the procedure including the risks, benefits and alternatives for the proposed anesthesia with the patient or authorized representative who has indicated his/her understanding and acceptance.   Dental advisory given  Plan Discussed with: CRNA  Anesthesia Plan Comments:         Anesthesia Quick Evaluation  

## 2013-01-30 ENCOUNTER — Encounter (HOSPITAL_COMMUNITY): Payer: Self-pay | Admitting: Urology

## 2013-01-30 LAB — URINE CULTURE
Colony Count: NO GROWTH
Culture: NO GROWTH

## 2013-02-01 ENCOUNTER — Encounter: Payer: Self-pay | Admitting: Family Medicine

## 2013-02-01 ENCOUNTER — Ambulatory Visit (INDEPENDENT_AMBULATORY_CARE_PROVIDER_SITE_OTHER): Payer: Medicare Other | Admitting: Family Medicine

## 2013-02-01 VITALS — BP 124/78 | Ht 70.0 in | Wt 191.0 lb

## 2013-02-01 DIAGNOSIS — M109 Gout, unspecified: Secondary | ICD-10-CM

## 2013-02-01 DIAGNOSIS — I4891 Unspecified atrial fibrillation: Secondary | ICD-10-CM

## 2013-02-01 DIAGNOSIS — I482 Chronic atrial fibrillation, unspecified: Secondary | ICD-10-CM

## 2013-02-01 DIAGNOSIS — IMO0001 Reserved for inherently not codable concepts without codable children: Secondary | ICD-10-CM

## 2013-02-01 DIAGNOSIS — I10 Essential (primary) hypertension: Secondary | ICD-10-CM

## 2013-02-01 DIAGNOSIS — Z23 Encounter for immunization: Secondary | ICD-10-CM

## 2013-02-01 NOTE — Progress Notes (Signed)
  Subjective:    Patient ID: [name], male    DOB: [birth year], 79 y.o.   MRN: [id number]  Hypertension This is a chronic problem. The current episode started more than 1 year ago. The problem has been gradually improving since onset. The problem is controlled. There are no associated agents to hypertension. There are no known risk factors for coronary artery disease. Treatments tried: amlodipine. The current treatment provides significant improvement. There are no compliance problems.   Patient states he has no other concerns.   Went thru recent stenting. Patient has been advised followup with the nephrologist. He is declining to do that. He has seen the urologist.   Compliant blood pressure medication. Trying to watch his salt intake. Not exercising much these days.  Patient is had recurrence of swelling at left great toe. Feels like there is fluid on it. History of gout in this area.   Review of Systems  no chest pain no back pain no abdominal pain ROS otherwise negative    Objective:   Physical Exam  Alert HEENT normal. Lungs clear heart rhythm irregular but controlled. Left great toe positive fluctuance at first metatarsophalangeal joint.      Assessment & Plan:  Impression toe bursitis post gout flare discussed. #2 hypertension good control. #3 renal insufficiency discuss #4 hyperlipidemia. Plan maintain same meds. Diet exercise discussed. Recheck in several months. WSL

## 2013-02-07 ENCOUNTER — Other Ambulatory Visit: Payer: Medicare Other

## 2013-02-07 ENCOUNTER — Ambulatory Visit: Payer: PRIVATE HEALTH INSURANCE | Admitting: Vascular Surgery

## 2013-02-12 ENCOUNTER — Emergency Department (HOSPITAL_COMMUNITY): Payer: Medicare Other

## 2013-02-12 ENCOUNTER — Encounter (HOSPITAL_COMMUNITY): Payer: Self-pay | Admitting: Emergency Medicine

## 2013-02-12 ENCOUNTER — Inpatient Hospital Stay (HOSPITAL_COMMUNITY)
Admission: EM | Admit: 2013-02-12 | Discharge: 2013-02-17 | DRG: 854 | Disposition: A | Discharge status: Home / Self Care | Payer: Medicare Other | Attending: Internal Medicine | Admitting: Internal Medicine

## 2013-02-12 DIAGNOSIS — K922 Gastrointestinal hemorrhage, unspecified: Secondary | ICD-10-CM

## 2013-02-12 DIAGNOSIS — K2940 Chronic atrophic gastritis without bleeding: Secondary | ICD-10-CM | POA: Diagnosis present

## 2013-02-12 DIAGNOSIS — D72829 Elevated white blood cell count, unspecified: Secondary | ICD-10-CM | POA: Diagnosis present

## 2013-02-12 DIAGNOSIS — E872 Acidosis, unspecified: Secondary | ICD-10-CM | POA: Diagnosis present

## 2013-02-12 DIAGNOSIS — I509 Heart failure, unspecified: Secondary | ICD-10-CM | POA: Diagnosis present

## 2013-02-12 DIAGNOSIS — K921 Melena: Secondary | ICD-10-CM | POA: Diagnosis present

## 2013-02-12 DIAGNOSIS — I959 Hypotension, unspecified: Secondary | ICD-10-CM

## 2013-02-12 DIAGNOSIS — I4891 Unspecified atrial fibrillation: Secondary | ICD-10-CM

## 2013-02-12 DIAGNOSIS — I714 Abdominal aortic aneurysm, without rupture, unspecified: Secondary | ICD-10-CM | POA: Diagnosis present

## 2013-02-12 DIAGNOSIS — R5381 Other malaise: Secondary | ICD-10-CM | POA: Diagnosis present

## 2013-02-12 DIAGNOSIS — E785 Hyperlipidemia, unspecified: Secondary | ICD-10-CM | POA: Diagnosis present

## 2013-02-12 DIAGNOSIS — M109 Gout, unspecified: Secondary | ICD-10-CM

## 2013-02-12 DIAGNOSIS — B3781 Candidal esophagitis: Secondary | ICD-10-CM | POA: Diagnosis present

## 2013-02-12 DIAGNOSIS — N200 Calculus of kidney: Secondary | ICD-10-CM

## 2013-02-12 DIAGNOSIS — N183 Chronic kidney disease, stage 3 unspecified: Secondary | ICD-10-CM | POA: Diagnosis present

## 2013-02-12 DIAGNOSIS — N390 Urinary tract infection, site not specified: Secondary | ICD-10-CM | POA: Diagnosis present

## 2013-02-12 DIAGNOSIS — I872 Venous insufficiency (chronic) (peripheral): Secondary | ICD-10-CM | POA: Diagnosis present

## 2013-02-12 DIAGNOSIS — IMO0001 Reserved for inherently not codable concepts without codable children: Secondary | ICD-10-CM

## 2013-02-12 DIAGNOSIS — I712 Thoracic aortic aneurysm, without rupture: Secondary | ICD-10-CM

## 2013-02-12 DIAGNOSIS — I2589 Other forms of chronic ischemic heart disease: Secondary | ICD-10-CM | POA: Diagnosis present

## 2013-02-12 DIAGNOSIS — N179 Acute kidney failure, unspecified: Secondary | ICD-10-CM | POA: Diagnosis not present

## 2013-02-12 DIAGNOSIS — I739 Peripheral vascular disease, unspecified: Secondary | ICD-10-CM | POA: Diagnosis present

## 2013-02-12 DIAGNOSIS — E875 Hyperkalemia: Secondary | ICD-10-CM | POA: Diagnosis present

## 2013-02-12 DIAGNOSIS — I728 Aneurysm of other specified arteries: Secondary | ICD-10-CM

## 2013-02-12 DIAGNOSIS — E538 Deficiency of other specified B group vitamins: Secondary | ICD-10-CM

## 2013-02-12 DIAGNOSIS — I129 Hypertensive chronic kidney disease with stage 1 through stage 4 chronic kidney disease, or unspecified chronic kidney disease: Secondary | ICD-10-CM | POA: Diagnosis present

## 2013-02-12 DIAGNOSIS — N1330 Unspecified hydronephrosis: Secondary | ICD-10-CM | POA: Diagnosis present

## 2013-02-12 DIAGNOSIS — R195 Other fecal abnormalities: Secondary | ICD-10-CM | POA: Diagnosis present

## 2013-02-12 DIAGNOSIS — I482 Chronic atrial fibrillation, unspecified: Secondary | ICD-10-CM | POA: Diagnosis present

## 2013-02-12 DIAGNOSIS — R339 Retention of urine, unspecified: Secondary | ICD-10-CM | POA: Diagnosis present

## 2013-02-12 DIAGNOSIS — Z7901 Long term (current) use of anticoagulants: Secondary | ICD-10-CM

## 2013-02-12 DIAGNOSIS — A419 Sepsis, unspecified organism: Principal | ICD-10-CM | POA: Diagnosis present

## 2013-02-12 DIAGNOSIS — D649 Anemia, unspecified: Secondary | ICD-10-CM | POA: Diagnosis present

## 2013-02-12 LAB — URINALYSIS, ROUTINE W REFLEX MICROSCOPIC
Protein, ur: 100 mg/dL — AB
Urobilinogen, UA: 1 mg/dL (ref 0.0–1.0)

## 2013-02-12 LAB — HEMOGLOBIN AND HEMATOCRIT, BLOOD: Hemoglobin: 10.8 g/dL — ABNORMAL LOW (ref 13.0–17.0)

## 2013-02-12 LAB — PROTIME-INR: Prothrombin Time: 15.7 seconds — ABNORMAL HIGH (ref 11.6–15.2)

## 2013-02-12 LAB — CBC
Hemoglobin: 11.7 g/dL — ABNORMAL LOW (ref 13.0–17.0)
MCH: 31 pg (ref 26.0–34.0)
Platelets: 209 10*3/uL (ref 150–400)
RBC: 3.77 MIL/uL — ABNORMAL LOW (ref 4.22–5.81)
RDW: 13.9 % (ref 11.5–15.5)
WBC: 13 10*3/uL — ABNORMAL HIGH (ref 4.0–10.5)

## 2013-02-12 LAB — TYPE AND SCREEN
ABO/RH(D): O POS
Antibody Screen: NEGATIVE

## 2013-02-12 LAB — ABO/RH: ABO/RH(D): O POS

## 2013-02-12 LAB — MRSA PCR SCREENING: MRSA by PCR: NEGATIVE

## 2013-02-12 LAB — COMPREHENSIVE METABOLIC PANEL
ALT: 8 U/L (ref 0–53)
AST: 10 U/L (ref 0–37)
Albumin: 3.1 g/dL — ABNORMAL LOW (ref 3.5–5.2)
Alkaline Phosphatase: 126 U/L — ABNORMAL HIGH (ref 39–117)
BUN: 106 mg/dL — ABNORMAL HIGH (ref 6–23)
CO2: 16 mEq/L — ABNORMAL LOW (ref 19–32)
Chloride: 100 mEq/L (ref 96–112)
Creatinine, Ser: 5.52 mg/dL — ABNORMAL HIGH (ref 0.50–1.35)
Potassium: 5.2 mEq/L — ABNORMAL HIGH (ref 3.5–5.1)
Sodium: 134 mEq/L — ABNORMAL LOW (ref 135–145)
Total Bilirubin: 0.5 mg/dL (ref 0.3–1.2)

## 2013-02-12 LAB — PROCALCITONIN: Procalcitonin: 0.94 ng/mL

## 2013-02-12 LAB — URINE MICROSCOPIC-ADD ON

## 2013-02-12 LAB — DIGOXIN LEVEL: Digoxin Level: 0.9 ng/mL (ref 0.8–2.0)

## 2013-02-12 MED ORDER — FAMOTIDINE IN NACL 20-0.9 MG/50ML-% IV SOLN
20.0000 mg | Freq: Once | INTRAVENOUS | Status: AC
  Administered 2013-02-12: 20 mg via INTRAVENOUS
  Filled 2013-02-12: qty 50

## 2013-02-12 MED ORDER — LATANOPROST 0.005 % OP SOLN
1.0000 [drp] | Freq: Every day | OPHTHALMIC | Status: DC
  Administered 2013-02-12 – 2013-02-16 (×5): 1 [drp] via OPHTHALMIC
  Filled 2013-02-12: qty 2.5

## 2013-02-12 MED ORDER — ACETAMINOPHEN 325 MG PO TABS: 650.0000 mg | ORAL_TABLET | Freq: Four times a day (QID) | ORAL | Status: DC | PRN

## 2013-02-12 MED ORDER — HYDROCODONE-ACETAMINOPHEN 10-325 MG PO TABS
1.0000 | ORAL_TABLET | Freq: Four times a day (QID) | ORAL | Status: DC | PRN
  Administered 2013-02-15 – 2013-02-16 (×2): 2 via ORAL
  Filled 2013-02-12 (×2): qty 2

## 2013-02-12 MED ORDER — CEFTRIAXONE SODIUM 1 G IJ SOLR
1.0000 g | Freq: Every day | INTRAMUSCULAR | Status: DC
  Administered 2013-02-12 – 2013-02-16 (×5): 1 g via INTRAVENOUS
  Filled 2013-02-12 (×5): qty 10

## 2013-02-12 MED ORDER — SODIUM CHLORIDE 0.9 % IJ SOLN
3.0000 mL | Freq: Two times a day (BID) | INTRAMUSCULAR | Status: DC
  Administered 2013-02-12 – 2013-02-15 (×4): 3 mL via INTRAVENOUS

## 2013-02-12 MED ORDER — PANTOPRAZOLE SODIUM 40 MG IV SOLR
40.0000 mg | Freq: Once | INTRAVENOUS | Status: AC
  Administered 2013-02-12: 40 mg via INTRAVENOUS
  Filled 2013-02-12: qty 40

## 2013-02-12 MED ORDER — STERILE WATER FOR INJECTION IV SOLN
INTRAVENOUS | Status: DC
  Administered 2013-02-12 – 2013-02-13 (×3): via INTRAVENOUS
  Filled 2013-02-12 (×8): qty 9.7

## 2013-02-12 MED ORDER — VITAMIN B-12 ER 2000 MCG PO TBCR: 2000.0000 ug | EXTENDED_RELEASE_TABLET | Freq: Every day | ORAL | Status: DC

## 2013-02-12 MED ORDER — ONDANSETRON HCL 4 MG/2ML IJ SOLN: 4.0000 mg | Freq: Four times a day (QID) | INTRAMUSCULAR | Status: DC | PRN

## 2013-02-12 MED ORDER — SODIUM CHLORIDE 0.9 % IV SOLN
Freq: Once | INTRAVENOUS | Status: AC
  Administered 2013-02-12: 15:00:00 via INTRAVENOUS

## 2013-02-12 MED ORDER — VITAMIN B-12 1000 MCG PO TABS
2000.0000 ug | ORAL_TABLET | Freq: Every day | ORAL | Status: DC
  Administered 2013-02-13 – 2013-02-16 (×3): 2000 ug via ORAL
  Filled 2013-02-12 (×6): qty 2

## 2013-02-12 MED ORDER — DIGOXIN 125 MCG PO TABS
0.1250 mg | ORAL_TABLET | ORAL | Status: DC
Start: 2013-02-14 — End: 2013-02-17
  Administered 2013-02-14 – 2013-02-16 (×2): 0.125 mg via ORAL
  Filled 2013-02-12 (×2): qty 1

## 2013-02-12 MED ORDER — ONDANSETRON HCL 4 MG PO TABS: 4.0000 mg | ORAL_TABLET | Freq: Four times a day (QID) | ORAL | Status: DC | PRN

## 2013-02-12 MED ORDER — ACETAMINOPHEN 650 MG RE SUPP: 650.0000 mg | Freq: Four times a day (QID) | RECTAL | Status: DC | PRN

## 2013-02-12 MED ORDER — METOPROLOL TARTRATE 1 MG/ML IV SOLN
2.5000 mg | Freq: Four times a day (QID) | INTRAVENOUS | Status: DC | PRN
  Administered 2013-02-13 – 2013-02-14 (×2): 5 mg via INTRAVENOUS
  Filled 2013-02-12 (×2): qty 5

## 2013-02-12 NOTE — ED Provider Notes (Signed)
79 year old male, history of bilateral kidney stones, status post bilateral ureteral stenting and removal, history of renal insufficiency as well. He has been on Coumadin in the past for atrial fibrillation but has been off for the last 2 weeks. Over the last several days he has noted cramping in his abdomen along with dark black stools, this is persistent, associated with poor appetite and fatigue. He was seen at the urologist office this morning who sent him over to the hospital for further evaluation and likely admission to the hospital.  On exam the patient has a soft abdomen with mild right-sided tenderness but no guarding. Clear heart and lung sounds, irregular rhythm consistent with atrial fibrillation. His laboratory workup shows that he has acute on chronic renal failure, mild anemia compared to his baseline and a slight leukocytosis. The patient will need admission to the hospital for the renal failure as well as the GI bleeding. Please see physician assistant note for rectal exam. He was Hemoccult positive.  Medical screening examination/treatment/procedure(s) were conducted as a shared visit with non-physician practitioner(s) and myself.  I personally evaluated the patient during the encounter.  Clinical Impression:   #1 acute on chronic renal failure #2 GI bleed #3 leukocytosis  Vida Roller, MD 02/12/13 1507

## 2013-02-12 NOTE — ED Notes (Signed)
Called [facility], states bed is now ready

## 2013-02-12 NOTE — ED Notes (Signed)
Bed: WA09 Expected date:  Expected time:  Means of arrival:  Comments: triage 

## 2013-02-12 NOTE — ED Provider Notes (Signed)
CSN: [id number]     Arrival date & time 02/12/13  1319 History   First MD Initiated Contact with Patient 02/12/13 1338     Chief Complaint  Patient presents with  . Rectal Bleeding  . Fatigue   (Consider location/radiation/quality/duration/timing/severity/associated sxs/prior Treatment) HPI Comments: The patient with a past medical history of A-fib, CKD,  bilateral nephrolithiasis s/p stenting presents with black stools for 5 days. The patient presents from the nephrology clinic for a follow up appointment and was told to come to the ED for further evaluation.  The patient reports constipation and took mira-lax at home and has had multiple loose black BM with a decrease in appetite.  He denies Iron supplements or Pepto. States chronic been on chronic coumadin use, but stopped 1 week prior to nephrology procedure and has not restarted. He also complains of some Right sided distention for 5 days. States he has had some lightheadedness over the past 3 days with change in body position without syncopal event. Denies fever chills, chest pain, dyspnea, or headache.  Patient is a 79 y.o. male presenting with hematochezia. The history is provided by the patient.  Rectal Bleeding   Past Medical History  Diagnosis Date  . Hyperlipidemia   . White coat hypertension   . Cardiomyopathy      EF:30-40% in 06/1998, but 50-55% in 2009;  HX ISCHEMIC CARDIOMYOPATHY  . Chronic atrial fibrillation   . Vitamin B12 deficiency   . Chronic anticoagulation   . Arthritis   . Sensory neuropathy   . Chronic kidney disease, stage III (moderate)     creatinine 1.5 2001, 2.0 2009, 2.28 in 12/2009  . Mild renal insufficiency   . Hydronephrosis, bilateral   . Ureteral calculi     BILATERAL  . History of CHF (congestive heart failure)     1999  &  2005  . History of kidney stones   . Bilateral renal cysts   . Aneurysm of infrarenal abdominal aorta 3.9 x 3.6 no change 8/05 no change in 2010;  4.7 in 2013   MONITORED BY DICKSON-- LAST VISIT OCT 2013--  4.7CM  . Chronic venous insufficiency   . PAD (peripheral artery disease)   . Edema of both legs   . History of gout     per pt stable  . Glaucoma of both eyes   . Wears hearing aid     bilateral   Past Surgical History  Procedure Laterality Date  . Repair thoracic aorta  01/1995    s/p rupture  . Cataract extraction w/ intraocular lens  implant, bilateral  2012  . 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 Laparoscopic cholecystectomy  JAN 2005  . Percutaneous nephrostolithotomy  1970's  . Abdominal aortagram  07-16-2003  DR ROTHBART    W/ COIL EMBOLIZATION OF SIX BRANCHES OF RIGHT INTERNAL RENAL ARTERY ANEURYSM  . Cardiovascular stress test  04-25-2003    LOW RISK CARDIOLITE STUDY/ MODERATE LV DILATATION AND IMPAIRMENT OF LVSF/  NO ISCHEMIA  . Transthoracic echocardiogram  02-07-2008  DR ROTHBART    MODERATE DILATED LV/  EF 50-55%/ MILD AR/ MILD TO MODERATE AORTIC ROOT DILATATION/ MODERATE MR/ MODERATE LA   &  RA   DIILATED/ MILD TO MODERATE TR  . Cystoscopy w/ ureteral stent placement Bilateral 10/24/2012    Procedure: CYSTOSCOPY WITH RETROGRADE PYELOGRAM/URETERAL STENT PLACEMENT;  Surgeon: Lindaann Slough, MD;  Location: [facility];  Service: Urology;  Laterality:  Bilateral;  . Cystoscopy with ureteroscopy Left 11/27/2012    Procedure: CYSTOSCOPY,LEFT URETEROSCOPY WITH LASER LITHOTRIPSY/REMOVAL OF MIGRATED STENT, PLACEMENT OF LEFT STENT;  Surgeon: Garnett Farm, MD;  Location: [facility];  Service: Urology;  Laterality: Left;  . Holmium laser application Left 11/27/2012    Procedure: HOLMIUM LASER APPLICATION;  Surgeon: Garnett Farm, MD;  Location: [facility];  Service: Urology;  Laterality: Left;  . Cystoscopy with ureteroscopy and stent placement N/A 12/29/2012    Procedure: CYSTOSCOPY WITH URETEROSCOPY AND STENT PLACEMENT, removal of right and left stents, retrogrades, right stent  exchange;  Surgeon: Garnett Farm, MD;  Location: [facility];  Service: Urology;  Laterality: N/A;  . Holmium laser application Right 12/29/2012    Procedure: HOLMIUM LASER APPLICATION;  Surgeon: Garnett Farm, MD;  Location: [facility];  Service: Urology;  Laterality: Right;  HLL OF (RT) URETERAL PELVIC JUNCTION STONE  . Cystoscopy with retrograde pyelogram, ureteroscopy and stent placement Right 01/29/2013    Procedure: CYSTOSCOPY WITH RIGHT URETEROSCOPY AND STENT PLACEMENT;  Surgeon: Garnett Farm, MD;  Location: [facility];  Service: Urology;  Laterality: Right;  . Holmium laser application N/A 01/29/2013    Procedure: HOLMIUM LASER APPLICATION;  Surgeon: Garnett Farm, MD;  Location: [facility];  Service: Urology;  Laterality: N/A;   Family History  Problem Relation Age of Onset  . Heart disease Mother   . Early death Father   . Aneurysm Brother   . Aortic aneurysm Brother   . Diabetes Sister    History  Substance Use Topics  . Smoking status: Never Smoker   . Smokeless tobacco: Never Used  . Alcohol Use: No    Review of Systems  Gastrointestinal: Positive for hematochezia.  All other systems reviewed and are negative.    Allergies  Sulfa antibiotics  Home Medications   Current Outpatient Rx  Name  Route  Sig  Dispense  Refill  . acetaminophen (TYLENOL) 500 MG tablet   Oral   Take 1,000 mg by mouth every 6 (six) hours as needed for pain.         Marland Kitchen amLODipine (NORVASC) 5 MG tablet   Oral   Take 5 mg by mouth every morning.          . Cyanocobalamin (VITAMIN B-12) 2000 MCG TBCR   Oral   Take 2,000 mcg by mouth daily.          . digoxin (DIGOX) 0.125 MG tablet   Oral   Take 0.125 mg by mouth every morning.          . furosemide (LASIX) 40 MG tablet   Oral   Take 40 mg by mouth every morning.          Marland Kitchen glucosamine-chondroitin 500-400 MG tablet   Oral   Take 2 tablets by mouth daily.          Marland Kitchen HYDROcodone-acetaminophen (NORCO) 10-325 MG per tablet   Oral    Take 1-2 tablets by mouth every 4 (four) hours as needed for pain.   30 tablet   0   . HYDROcodone-acetaminophen (NORCO) 10-325 MG per tablet   Oral   Take 1-2 tablets by mouth every 4 (four) hours as needed for pain.   30 tablet   0   . latanoprost (XALATAN) 0.005 % ophthalmic solution   Both Eyes   Place 1 drop into both eyes at bedtime.          . metoprolol (LOPRESSOR) 100  MG tablet   Oral   Take 100 mg by mouth 2 (two) times daily.         . phenazopyridine (PYRIDIUM) 200 MG tablet   Oral   Take 1 tablet (200 mg total) by mouth 3 (three) times daily as needed for pain.   30 tablet   0   . Psyllium (METAMUCIL PO)   Oral   Take by mouth. ONE TEASPOON AT NIGHT AS NEEDED         . simvastatin (ZOCOR) 20 MG tablet   Oral   Take 20 mg by mouth daily.          Marland Kitchen warfarin (COUMADIN) 2.5 MG tablet   Oral   Take 2.5 mg by mouth See admin instructions. Takes 6 days per week with exception not on wednesday          BP 65/36  Pulse 88  Temp(Src) 98.1 F (36.7 C) (Oral)  Resp 16  SpO2 97% Physical Exam  Nursing note and vitals reviewed. Constitutional: He appears well-developed. No distress.  Elderly male  HENT:  Head: Normocephalic and atraumatic.  Mouth/Throat: Mucous membranes are dry.  Eyes: EOM are normal.  Neck: Neck supple.  Cardiovascular: Normal rate.  An irregularly irregular rhythm present.  Pulmonary/Chest: Effort normal and breath sounds normal. Not tachypneic. No respiratory distress. He has no decreased breath sounds. He has no wheezes. He has no rhonchi.  Abdominal: Soft. Bowel sounds are normal. There is tenderness. There is no rebound and no guarding.  Mild Right flank destension  Genitourinary: Rectal exam shows external hemorrhoid.  No gross blood on digital rectal exam.   Musculoskeletal: He exhibits no edema.  Neurological: He is alert.  Skin: Skin is warm, dry and intact. No rash noted. No pallor.  Pink nail beds  Psychiatric: He  has a normal mood and affect. His behavior is normal.    ED Course  Procedures (including critical care time) Labs Review Labs Reviewed  CBC - Abnormal; Notable for the following:    WBC 13.0 (*)    RBC 3.77 (*)    Hemoglobin 11.7 (*)    HCT 35.1 (*)    All other components within normal limits  COMPREHENSIVE METABOLIC PANEL - Abnormal; Notable for the following:    Sodium 134 (*)    Potassium 5.2 (*)    CO2 16 (*)    Glucose, Bld 108 (*)    BUN 106 (*)    Creatinine, Ser 5.52 (*)    Albumin 3.1 (*)    Alkaline Phosphatase 126 (*)    GFR calc non Af Amer 8 (*)    GFR calc Af Amer 10 (*)    All other components within normal limits  PROTIME-INR - Abnormal; Notable for the following:    Prothrombin Time 15.7 (*)    All other components within normal limits  APTT - Abnormal; Notable for the following:    aPTT 45 (*)    All other components within normal limits  OCCULT BLOOD, POC DEVICE - Abnormal; Notable for the following:    Fecal Occult Bld POSITIVE (*)    All other components within normal limits  URINALYSIS, ROUTINE W REFLEX MICROSCOPIC  DIGOXIN LEVEL  TYPE AND SCREEN   Imaging Review No results found.  EKG Interpretation     Ventricular Rate:  93 PR Interval:    QRS Duration: 122 QT Interval:  337 QTC Calculation: 420 R Axis:   15 Text Interpretation:  Atrial fibrillation  Nonspecific intraventricular conduction delay Repol abnrm suggests ischemia, diffuse leads Baseline wander in lead(s) V6 Since last tracing rate faster            MDM   1. GI bleed   2. Renal failure (ARF), acute on chronic    Patient with a past medical history of A-fib, and recent bilateral Nephrolithiasis with stenting presents with a several day history of melena. On exam the patient is not pale, not tachycardic, No gross blood or melenotic stool, hemoccult positive. CBC, CMP, UA, Chest X-ray, type and screen, Coag studies ordered, pepcid and protonix ordered.  Will gently  hydrate.   Discussed patient with Dr. Hyacinth Meeker and agrees with current work up and will consult patient's nephrologist about recent procedure.   Hbg 11.8, Cr 5.52 Dr. Hyacinth Meeker evaluated patient and added on a Digoxin level and will consult Internal Medicine for admission.  Dr. Hyacinth Meeker consulted Internal Medicine and will admit the patient        Clabe Seal, PA-C 02/14/13 1437

## 2013-02-12 NOTE — H&P (Addendum)
[facility] History and Physical  JONIEL GRAUMANN ZOX:[id number] DOB: Aug 25, 1925 DOA: 02/12/2013  Referring physician: Dr Hyacinth Meeker PCP: [name], MD  Specialists: Dr [name]  Chief Complaint: melena  HPI: [name] is a 79 y.o. male with multiple medical problems as listed below including chronic afib on coumadin- but has been off (the coumadin) for about 10days or so for his urologic procedures per Dr [name], CKD stage 3, bilat hydronephrosis, ureteral calculi, recently hospitalized (10/6) for Right ureteral stone extraction/Right ureteral stent removal/Right double-J stent placement and followed up last week for stent removal per urology and presents with the above complaints. He states that he was constipated following most recent procedures and was prescribes miralax, and reported  Melanotic stools x 3days. He denies n/v, and reports having had some R. Sided abd pain initially but that resolved. He came to see Dr [name] at office today and was asked to come the EDP. In ED hgb was 11.7(down from 13.1 on 10/6), and cr 5.52-from 2.48 on 10/6, k. 5.2 and CO2 16, UA c/w UTI  And BP dropped to 90s. Renal US was done and showed Marked right hydronephrosis, as on previous CT scan and moderate left hydronephrosis, improved substantially compared prior. Dr [name] was consulted per EDP and states he will follow pt. He has a foley in place. He is admitted for further eval and management. He admits to decreased PO intake and gen. weakness, denies N/V, and no Nsaid use reported.      Review of Systems: The patient denies fever, vision loss, decreased hearing, hoarseness, chest pain, syncope, dyspnea on exertion, peripheral edema, balance deficits, hemoptysishematochezia, severe indigestion/heartburn, hematuria, incontinence, genital sores, muscle weakness, suspicious skin lesions, transient blindness, difficulty walking, depression, unusual weight change,   Past Medical History  Diagnosis Date  .  Hyperlipidemia   . White coat hypertension   . Cardiomyopathy      EF:30-40% in 06/1998, but 50-55% in 2009;  HX ISCHEMIC CARDIOMYOPATHY  . Chronic atrial fibrillation   . Vitamin B12 deficiency   . Chronic anticoagulation   . Arthritis   . Sensory neuropathy   . Chronic kidney disease, stage III (moderate)     creatinine 1.5 2001, 2.0 2009, 2.28 in 12/2009  . Mild renal insufficiency   . Hydronephrosis, bilateral   . Ureteral calculi     BILATERAL  . History of CHF (congestive heart failure)     1999  &  2005  . History of kidney stones   . Bilateral renal cysts   . 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 Chronic venous insufficiency   . PAD (peripheral artery disease)   . Edema of both legs   . History of gout     per pt stable  . Glaucoma of both eyes   . Wears hearing aid     bilateral   Past Surgical History  Procedure Laterality Date  . Repair thoracic aorta  01/1995    s/p rupture  . Cataract extraction w/ intraocular lens  implant, bilateral  2012  . 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 Laparoscopic cholecystectomy  JAN 2005  . Percutaneous nephrostolithotomy  1970's  . Abdominal aortagram  07-16-2003  DR ROTHBART    W/ COIL EMBOLIZATION OF SIX BRANCHES OF RIGHT INTERNAL RENAL  ARTERY ANEURYSM  . Cardiovascular stress test  04-25-2003    LOW RISK CARDIOLITE STUDY/ MODERATE LV DILATATION AND IMPAIRMENT OF LVSF/  NO ISCHEMIA  . Transthoracic echocardiogram  02-07-2008  DR ROTHBART    MODERATE DILATED LV/  EF 50-55%/ MILD AR/ MILD TO MODERATE AORTIC ROOT DILATATION/ MODERATE MR/ MODERATE LA   &  RA   DIILATED/ MILD TO MODERATE TR  . Cystoscopy w/ ureteral stent placement Bilateral 10/24/2012    Procedure: CYSTOSCOPY WITH RETROGRADE PYELOGRAM/URETERAL STENT PLACEMENT;  Surgeon: Lindaann Slough, MD;   Location: [facility];  Service: Urology;  Laterality: Bilateral;  . Cystoscopy with ureteroscopy Left 11/27/2012    Procedure: CYSTOSCOPY,LEFT URETEROSCOPY WITH LASER LITHOTRIPSY/REMOVAL OF MIGRATED STENT, PLACEMENT OF LEFT STENT;  Surgeon: Garnett Farm, MD;  Location: [facility];  Service: Urology;  Laterality: Left;  . Holmium laser application Left 11/27/2012    Procedure: HOLMIUM LASER APPLICATION;  Surgeon: Garnett Farm, MD;  Location: [facility];  Service: Urology;  Laterality: Left;  . Cystoscopy with ureteroscopy and stent placement N/A 12/29/2012    Procedure: CYSTOSCOPY WITH URETEROSCOPY AND STENT PLACEMENT, removal of right and left stents, retrogrades, right stent exchange;  Surgeon: Garnett Farm, MD;  Location: [facility];  Service: Urology;  Laterality: N/A;  . Holmium laser application Right 12/29/2012    Procedure: HOLMIUM LASER APPLICATION;  Surgeon: Garnett Farm, MD;  Location: [facility];  Service: Urology;  Laterality: Right;  HLL OF (RT) URETERAL PELVIC JUNCTION STONE  . Cystoscopy with retrograde pyelogram, ureteroscopy and stent placement Right 01/29/2013    Procedure: CYSTOSCOPY WITH RIGHT URETEROSCOPY AND STENT PLACEMENT;  Surgeon: Garnett Farm, MD;  Location: [facility];  Service: Urology;  Laterality: Right;  . Holmium laser application N/A 01/29/2013    Procedure: HOLMIUM LASER APPLICATION;  Surgeon: Garnett Farm, MD;  Location: [facility];  Service: Urology;  Laterality: N/A;   Social History:  reports that he has never smoked. He has never used smokeless tobacco. He reports that he does not drink alcohol or use illicit drugs.  where does patient live--home   Allergies  Allergen Reactions  . Sulfa Antibiotics Rash    Nausea     Family History  Problem Relation Age of Onset  . Heart disease Mother   . Early death Father   . Aneurysm Brother   . Aortic aneurysm Brother   . Diabetes Sister     Prior to Admission medications   Medication Sig Start Date End Date  Taking? Authorizing Provider  acetaminophen (TYLENOL) 500 MG tablet Take 1,000 mg by mouth every 6 (six) hours as needed for pain.   Yes Historical Provider, MD  amLODipine (NORVASC) 5 MG tablet Take 5 mg by mouth every morning.  04/27/12  Yes Kathlen Brunswick, MD  Cyanocobalamin (VITAMIN B-12) 2000 MCG TBCR Take 2,000 mcg by mouth daily.    Yes Historical Provider, MD  digoxin (DIGOX) 0.125 MG tablet Take 0.125 mg by mouth every morning. Take 1 tablet by mouth Monday through Friday. 10/02/12  Yes Kathlen Brunswick, MD  doxazosin (CARDURA) 4 MG tablet Take 4 mg by mouth at bedtime.   Yes Historical Provider, MD  furosemide (LASIX) 40 MG tablet Take 40 mg by mouth every morning.  04/27/12  Yes Kathlen Brunswick, MD  glucosamine-chondroitin 500-400 MG tablet Take 2 tablets by mouth daily.    Yes Historical Provider, MD  HYDROcodone-acetaminophen (NORCO) 10-325 MG per tablet Take 1-2 tablets by mouth every  4 (four) hours as needed for pain. 01/29/13  Yes Garnett Farm, MD  metoprolol (LOPRESSOR) 100 MG tablet Take 100 mg by mouth 2 (two) times daily.   Yes Historical Provider, MD  ondansetron (ZOFRAN-ODT) 4 MG disintegrating tablet Take 4 mg by mouth every 8 (eight) hours as needed for nausea.   Yes Historical Provider, MD  phenazopyridine (PYRIDIUM) 200 MG tablet Take 1 tablet (200 mg total) by mouth 3 (three) times daily as needed for pain. 01/29/13  Yes Garnett Farm, MD  simvastatin (ZOCOR) 20 MG tablet Take 20 mg by mouth daily.    Yes Historical Provider, MD  latanoprost (XALATAN) 0.005 % ophthalmic solution Place 1 drop into both eyes at bedtime.  08/12/12   Historical Provider, MD  warfarin (COUMADIN) 2.5 MG tablet Take 2.5 mg by mouth See admin instructions. Takes 6 days per week with exception not on wednesday    Historical Provider, MD   Physical Exam: Filed Vitals:   02/12/13 1605  BP: 92/54  Pulse: 95  Temp:   Resp: 17   Constitutional: Vital signs reviewed.  Patient is a well-developed  and well-nourished  in no acute distress and cooperative with exam. Alert and oriented x3.  Head: Normocephalic and atraumatic Mouth: no erythema or exudates, slightly dry MM Eyes: PERRL, EOMI, conjunctivae normal, No scleral icterus.  Neck: Supple, Trachea midline normal ROM, No JVD, mass, thyromegaly, or carotid bruit present.  Cardiovascular: Irreg, nlS1 normal, S2 normal, rate controlled, pulses symmetric and intact bilaterally Pulmonary/Chest: normal respiratory effort, decreased BS at bases, no wheezes, rales, or rhonchi Abdominal: Soft. Non-tender, non-distended, bowel sounds are normal, no masses, organomegaly, or guarding present.  GU: no CVA tenderness Extremities: No cyanosis and no edema Neurological: A&O x3, Strength is normal and symmetric bilaterally, cranial nerve II-XII are grossly intact, no focal motor deficit, sensory intact to light touch bilaterally.  Skin: Warm, dry and intact. No rash.  Psychiatric: Normal mood and affect. speech and behavior is normal.      Labs on Admission:  Basic Metabolic Panel:  Recent Labs Lab 02/12/13 1410  NA 134*  K 5.2*  CL 100  CO2 16*  GLUCOSE 108*  BUN 106*  CREATININE 5.52*  CALCIUM 8.9   Liver Function Tests:  Recent Labs Lab 02/12/13 1410  AST 10  ALT 8  ALKPHOS 126*  BILITOT 0.5  PROT 7.1  ALBUMIN 3.1*   No results found for this basename: LIPASE, AMYLASE,  in the last 168 hours No results found for this basename: AMMONIA,  in the last 168 hours CBC:  Recent Labs Lab 02/12/13 1410  WBC 13.0*  HGB 11.7*  HCT 35.1*  MCV 93.1  PLT 209   Cardiac Enzymes: No results found for this basename: CKTOTAL, CKMB, CKMBINDEX, TROPONINI,  in the last 168 hours  BNP (last 3 results) No results found for this basename: PROBNP,  in the last 8760 hours CBG: No results found for this basename: GLUCAP,  in the last 168 hours  Radiological Exams on Admission: Dg Chest 2 View  02/12/2013   CLINICAL DATA:  Stent  placement. Rectal bleeding. Fatigue.  EXAM: CHEST  2 VIEW  COMPARISON:  10/24/2012  FINDINGS: Stable moderate cardiomegaly. Tortuous and atherosclerotic thoracic aorta. Prior median sternotomy.  Minimal bibasilar scarring. No pleural effusion noted. Thoracic spondylosis is present.  IMPRESSION: 1. Stable moderate cardiomegaly with tortuous and atherosclerotic thoracic aorta. 2. Stable scarring in the lung bases.   Electronically Signed   By: Lorelle Gibbs  Ova Freshwater M.D.   On: 02/12/2013 15:28   US Renal  02/12/2013   CLINICAL DATA:  Elevated creatinine.  EXAM: RENAL/URINARY TRACT ULTRASOUND COMPLETE  COMPARISON:  CT scan from 10/18/2012  FINDINGS: Right Kidney  Length: 19.6 cm. There is a large cystic process in the right renal hilum consistent with marked hydronephrosis, as seen on the previous CT scan.  Left Kidney  Length: 16.0 cm. Moderate left hydronephrosis is evident, but appearance is substantially improved compared to the previous CT scan.  Bladder  Foley balloon is identified within the decompressed urinary bladder.  Abdominal aortic aneurysm measures 4.8 cm.  IMPRESSION: Marked right hydronephrosis, seen on the previous CT scan. There is moderate left hydronephrosis, improved substantially compared to the prior CT scan.  Abdominal aortic aneurysm.   Electronically Signed   By: Kennith Center M.D.   On: 02/12/2013 16:17      Assessment/Plan Active Problems: Melena/GIB -As discussed above, pt has been off coumadin and INR is not elevated -monitor serial H/H, type and screen follow transfuse as clinically appropriate -Continue holding off coumadin -consult GI, for further recs UTI -obtain urine and blood cultures, empiric abx with rocephin pending cultures Sepsis syndrome /Hypotension -blood and urine cultures as above, empiric abx -obtain lactic acid level and proclacitonin level, follow and consider CCM consult pending studies/clinical course -#1 likely contributing to hypotension, hydrate  with IVF follow and transfuse as above Atrial fibrillation, chronic -continue dig- renal dose -hold metoprolol due to hypotension, monitor and further treat as appropriate -holding off coumadin due to #1  Acute renal failure on Chronic kidney disease, stage III (moderate) - secondary to hydronephrosis-renal US as above, and #1also contributing -hydrate with IVF/bicarb as above -pt has foley in place, urology to follow -follow and if not improving consult renal Hyperkalemia/metab acidosis -hydrating with ivf/bicarb as above, follow and recheck -due to ARF, as discussed above follow and consult renal if refactory. CM/CHF -compensated, monitor fluid status closely and further treat as appropriate.     Code Status: full  Family Communication: Grandson at bedside Disposition Plan: to home when medically stable  Time spent: >28mins  Kela Millin [facility] Pager [phone]  If 7PM-7AM, please contact night-coverage [email or web address] Password [facility] 02/12/2013, 4:41 PM

## 2013-02-12 NOTE — ED Notes (Signed)
pcp called, pt hx of massive hydonephrosis and kidney stones. Today came to pcp for constipation and decreased appetite. Pt reports he was constipated, took miralax and now is having black tarry stools. Pt hypotensive at pcp. Doctor will fax over progress note.

## 2013-02-12 NOTE — ED Notes (Signed)
[facility] called, pt room to be changed to 1237 due to staffing.

## 2013-02-12 NOTE — ED Notes (Signed)
hospitalist at bedside

## 2013-02-13 DIAGNOSIS — M109 Gout, unspecified: Secondary | ICD-10-CM

## 2013-02-13 DIAGNOSIS — N179 Acute kidney failure, unspecified: Secondary | ICD-10-CM

## 2013-02-13 DIAGNOSIS — Z7901 Long term (current) use of anticoagulants: Secondary | ICD-10-CM

## 2013-02-13 DIAGNOSIS — N189 Chronic kidney disease, unspecified: Secondary | ICD-10-CM

## 2013-02-13 DIAGNOSIS — R195 Other fecal abnormalities: Secondary | ICD-10-CM | POA: Diagnosis present

## 2013-02-13 LAB — CBC
Hemoglobin: 10.7 g/dL — ABNORMAL LOW (ref 13.0–17.0)
MCH: 31.2 pg (ref 26.0–34.0)
MCHC: 33.9 g/dL (ref 30.0–36.0)
Platelets: 199 10*3/uL (ref 150–400)
RDW: 13.9 % (ref 11.5–15.5)
WBC: 10.6 10*3/uL — ABNORMAL HIGH (ref 4.0–10.5)

## 2013-02-13 LAB — BASIC METABOLIC PANEL
BUN: 110 mg/dL — ABNORMAL HIGH (ref 6–23)
CO2: 17 mEq/L — ABNORMAL LOW (ref 19–32)
Calcium: 8.7 mg/dL (ref 8.4–10.5)
GFR calc non Af Amer: 9 mL/min — ABNORMAL LOW (ref >90)
Glucose, Bld: 96 mg/dL (ref 70–99)
Sodium: 134 mEq/L — ABNORMAL LOW (ref 135–145)

## 2013-02-13 LAB — HEMOGLOBIN AND HEMATOCRIT, BLOOD
HCT: 32.1 % — ABNORMAL LOW (ref 39.0–52.0)
Hemoglobin: 10.8 g/dL — ABNORMAL LOW (ref 13.0–17.0)

## 2013-02-13 MED ORDER — SODIUM CHLORIDE 0.9 % IV SOLN
INTRAVENOUS | Status: DC
  Administered 2013-02-13 – 2013-02-14 (×2): 1000 mL via INTRAVENOUS
  Administered 2013-02-15 (×2): via INTRAVENOUS
  Administered 2013-02-16: 500 mL via INTRAVENOUS
  Administered 2013-02-16 – 2013-02-17 (×3): via INTRAVENOUS

## 2013-02-13 MED ORDER — PANTOPRAZOLE SODIUM 40 MG PO TBEC
40.0000 mg | DELAYED_RELEASE_TABLET | Freq: Two times a day (BID) | ORAL | Status: DC
  Administered 2013-02-13 – 2013-02-16 (×8): 40 mg via ORAL
  Filled 2013-02-13 (×11): qty 1

## 2013-02-13 NOTE — Consult Note (Signed)
Reason For Visit  [name] is an 79 year old male with a history of chronic hydronephrosis is seen today for right flank pain and weakness.   History of Present Illness  Bilateral UPJ stones: The patient was found to have extremely severe bilateral hydronephrosis and underwent double-J stent placement bilaterally on 10/24/12.  His hydronephrosis was secondary to bilateral UPJ stones. His last stent migrated up the ureter and on 11/27/12 I removed his left ureteral stent, treated his left UPJ stone with laser lithotripsy and placed a new stent. He underwent right ureteroscopy and partial fragmentation of his right ureteral stone on 12/29/12.  At that time I removed his left ureteral stent. On 01/29/13 I performed a second right ureteroscopy and removed the remaining stone from his ureter. A stent was left in place which was then removed on 02/06/13.  Interval history: When I removed his stent on 02/07/39 he had cloudy-appearing urine so a culture was performed but was found to be negative for infection. He then developed urinary retention and had a Foley catheter placed. He was started on empiric Cipro. He comes in to the office today and indicates that he said he feels like he can't eat although it's not because he is having nausea or vomiting. He said he had significant constipation initially but started taking MiraLax and has subsequently begun to have daily bowel movements again. He reports however that his bowel movements are black and tarry in nature and his bowel movement this morning had flecks of black as well. He's been having some mild discomfort in his right flank region but he said he has not had any fever, chills or diaphoresis. He said the right flank discomfort is worse in the morning and seems to work its way out the day. Since his last surgery he has had some sensation of swelling in the throat. He has a Foley catheter in and he said he was hoping he could get his catheter out today. He also  reports generalized malaise/weakness and is requiring a cane to walk for the first time in his life because of this.   Past Medical History Problems  1. History of  Arthritis V13.4 2. History of  Atrial Fibrillation 427.31 3. History of  Chronic Kidney Disease, Stage 4 585.4 4. History of  Glaucoma 365.9 5. History of  Gout 274.9 6. History of  Hypertension 401.9 7. History of  Proximal Ureteral Stone On The Left 592.1  Surgical History Problems  1. History of  Cholecystectomy 2. History of  Cystoscopy With Insertion Of Ureteral Stent Bilateral 3. History of  Cystoscopy With Insertion Of Ureteral Stent Left 4. History of  Cystoscopy With Insertion Of Ureteral Stent Right 5. History of  Cystoscopy With Insertion Of Ureteral Stent Right 6. History of  Cystoscopy With Removal Of Object 7. History of  Cystoscopy With Ureteroscopy With Lithotripsy 8. History of  Cystoscopy With Ureteroscopy With Lithotripsy 9. History of  Cystoscopy With Ureteroscopy With Lithotripsy 10. History of  Cystoscopy With Ureteroscopy With Removal Of Calculus 11. History of  Cystoscopy With Ureteroscopy With Removal Of Calculus 12. History of  Eye Surgery 13. History of  Lithotripsy 14. History of  Surgery For Ruptured Abdominal Aortic Aneurysm  Current Meds 1. AmLODIPine Besylate TABS; Therapy: (Recorded:16Jun2014) to 2. Colcrys 0.6 MG Oral Tablet; Therapy: 14May2014 to 3. Digox 125 MCG Oral Tablet; Therapy: 05Jun2014 to 4. Doxazosin Mesylate 4 MG Oral Tablet; TAKE 1 TABLET DAILY AS DIRECTED; Therapy:  15Oct2014 to (Evaluate:14Nov2014)  Requested  for: 15Oct2014; Last Rx:15Oct2014 5. Furosemide 40 MG Oral Tablet; Therapy: (Recorded:16Jun2014) to 6. Hydrocodone-Acetaminophen 10-325 MG Oral Tablet; TAKE 1 TABLET EVERY 6 HOURS AS  NEEDED FOR PAIN; Therapy: 05Sep2014 to (Evaluate:22Oct2014); Last Rx:15Oct2014 7. Latanoprost 0.005 % Ophthalmic Solution; Therapy: 30Dec2013 to 8. Losartan Potassium 50 MG Oral  Tablet; Therapy: (Recorded:16Jun2014) to 9. Metoprolol Tartrate TABS; Therapy: (Recorded:16Jun2014) to 10. Ondansetron 4 MG Oral Tablet Dispersible; TAKE 1 TABLET 3 times daily PRN nausea; Therapy:   15Oct2014 to (Last Rx:15Oct2014)  Requested for: 15Oct2014 11. Simvastatin 20 MG Oral Tablet; Therapy: (Recorded:16Jun2014) to 12. SPS SUSP; Therapy: (Recorded:16Jun2014) to 13. Vitamin B-12 TABS; Therapy: (Recorded:16Jun2014) to 14. Warfarin Sodium 2.5 MG Oral Tablet; Therapy: (Recorded:16Jun2014) to  Allergies Medication  1. Sulfa Drugs  Family History Problems  1. Family history of  Father Deceased At Age 77 2. Family history of  Mother Deceased At Age 60 3. Family history of  Nephrolithiasis  Social History Problems  1. Caffeine Use 3 cups coffee 2. Marital History - Single 3. Never A Smoker 4. Occupation: Retired Nurse, children's  5. History of  Alcohol Use 6. History of  Tobacco Use  Review of Systems Genitourinary and gastrointestinal system(s) were reviewed and pertinent findings if present are noted.  Genitourinary: urinary frequency and nocturia.    Vitals Vital Signs [Data Includes: Last 1 Day]  20Oct2014 12:36PM  Blood Pressure: 75 / 45 Temperature: 97.3 F Heart Rate: 78  Physical Exam Abdomen: The abdomen is soft and nontender. No masses are palpated. mild right CVA tenderness. No hernias are palpable. No hepatosplenomegaly noted.    Results/Data  The following clinical lab reports were reviewed:  UA:.    Assessment Assessed  1. Acute Urinary Retention 788.20 2. Hydronephrosis On The Left 591 3. Black Or Tarry Stools (Melena) 578.1 4. Hydronephrosis On The Right 591   I found that he had very mild right CVAT. He has a Foley catheter in and he is taking empiric Cipro right now. As far as his hydronephrosis does it has been long-standing in his chronic and any imaging study would likely show significant hydronephrosis which could be misleading. I told him that if  there was any question of obstruction on the right-hand side, because previous difficulty with attempts at placing a stent beyond the location of his stone in the mid to upper ureter, he would almost certainly benefit more from a right percutaneous nephrostomy tube placement which could then be internalized if necessary.  My greater concern is that of hypotension. He doesn't have tachycardia. He reports black tarry stools. I discussed with the patient and his family member with him today the need to proceed to the emergency room for possible admission to the hospital and for right now I'm going to leave his Foley catheter indwelling.   Plan Hydronephrosis On The Right (591)  1. Follow-up Keep Future Appt Office  Follow-up  Done: 20Oct2014   He will proceed to the emergency room. I have called and given a brief history already.   Addendum: I reviewed his renal ultrasound images which reveals much more improvement in his hydronephrosis on the left-hand side and I would have expected however he continues to have persistent dilatation of his collecting system on the right-hand side. With an elevated creatinine and right-sided pain I believe he probably has some degree of obstruction of his kidney and relief of that obstruction would likely improve his pain. It may not improve his creatinine significantly since having bilateral double-J stent I'm obstructing his  system previously did not result in any significant improvement in his creatinine. In my opinion, because of the severe tortuosity of his ureter and the inability to advance a stent beyond the upper third of his ureter that placement of a percutaneous nephrostomy tube would be necessary in this case. I think this would benefit the patient in relieving any obstruction but would also allow the placement of an antegrade stent which would have a much greater probability of passage through the area of tortuosity and on down the ureter. Although he had  pyuria and an infection needs to be ruled out he had much worse looking urine in the office today I took his stent out and his culture at that time was negative. In addition he had been on Cipro. I would be surprised if his culture proves positive.

## 2013-02-13 NOTE — Progress Notes (Signed)
Patient ID: [name], male   DOB: 10-20-1925, 79 y.o.   MRN: [id number] Request received for placement of a right percutaneous nephrostomy tube in pt with hx of chronic bilateral hydronephrosis/UPJ stones, worsening renal failure, right flank pain . Recent renal US reveals marked right hydronephrosis and moderate but improved left hydronephrosis. Pt has had bilateral ureteral stents placed in past but since removed, most recently on the right on 02/06/13 . There is a hx of severe tortuosity of right ureter as well. Pt has also reported recent onset of black tarry stools (GI following) . Additional PMH as below. Exam: pt awake/alert; chest- CTA bilat; heart- irreg irreg; abd- soft,+BS, tender rt flank/lat abd region with some fullness in area, ext- FROM, no edema.     Filed Vitals:   02/13/13 1700 02/13/13 1800 02/13/13 1900 02/13/13 2000  BP: 116/75 109/62 124/60   Pulse: 95  88   Temp:    98.7 F (37.1 C)  TempSrc:    Oral  Resp: 17 23 20    Height:      Weight:      SpO2: 94% 94% 95%    Past Medical History  Diagnosis Date  . Hyperlipidemia   . White coat hypertension   . Cardiomyopathy      EF:30-40% in 06/1998, but 50-55% in 2009;  HX ISCHEMIC CARDIOMYOPATHY  . Chronic atrial fibrillation   . Vitamin B12 deficiency   . Chronic anticoagulation   . Arthritis   . Sensory neuropathy   . Chronic kidney disease, stage III (moderate)     creatinine 1.5 2001, 2.0 2009, 2.28 in 12/2009  . Mild renal insufficiency   . Hydronephrosis, bilateral   . Ureteral calculi     BILATERAL  . History of CHF (congestive heart failure)     1999  &  2005  . History of kidney stones   . Bilateral renal cysts   . 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 Chronic venous insufficiency   . PAD (peripheral artery disease)   . Edema of both legs   . History of gout     per pt stable  .  Glaucoma of both eyes   . Wears hearing aid     bilateral   Past Surgical History  Procedure Laterality Date  . Repair thoracic aorta  01/1995    s/p rupture  . Cataract extraction w/ intraocular lens  implant, bilateral  2012  . 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 Laparoscopic cholecystectomy  JAN 2005  . Percutaneous nephrostolithotomy  1970's  . Abdominal aortagram  07-16-2003  DR ROTHBART    W/ COIL EMBOLIZATION OF SIX BRANCHES OF RIGHT INTERNAL RENAL ARTERY ANEURYSM  . Cardiovascular stress test  04-25-2003    LOW RISK CARDIOLITE STUDY/ MODERATE LV DILATATION AND IMPAIRMENT OF LVSF/  NO ISCHEMIA  . Transthoracic echocardiogram  02-07-2008  DR ROTHBART    MODERATE DILATED LV/  EF 50-55%/ MILD AR/ MILD TO MODERATE AORTIC ROOT DILATATION/ MODERATE MR/ MODERATE LA   &  RA   DIILATED/ MILD TO MODERATE TR  . Cystoscopy w/ ureteral stent placement Bilateral 10/24/2012    Procedure: CYSTOSCOPY WITH RETROGRADE PYELOGRAM/URETERAL STENT PLACEMENT;  Surgeon: Lindaann Slough, MD;  Location: [facility];  Service: Urology;  Laterality: Bilateral;  .  Cystoscopy with ureteroscopy Left 11/27/2012    Procedure: CYSTOSCOPY,LEFT URETEROSCOPY WITH LASER LITHOTRIPSY/REMOVAL OF MIGRATED STENT, PLACEMENT OF LEFT STENT;  Surgeon: Garnett Farm, MD;  Location: [facility];  Service: Urology;  Laterality: Left;  . Holmium laser application Left 11/27/2012    Procedure: HOLMIUM LASER APPLICATION;  Surgeon: Garnett Farm, MD;  Location: [facility];  Service: Urology;  Laterality: Left;  . Cystoscopy with ureteroscopy and stent placement N/A 12/29/2012    Procedure: CYSTOSCOPY WITH URETEROSCOPY AND STENT PLACEMENT, removal of right and left stents, retrogrades, right stent exchange;  Surgeon: Garnett Farm, MD;  Location: [facility];  Service: Urology;  Laterality: N/A;  . Holmium laser application Right 12/29/2012    Procedure: HOLMIUM LASER APPLICATION;   Surgeon: Garnett Farm, MD;  Location: [facility];  Service: Urology;  Laterality: Right;  HLL OF (RT) URETERAL PELVIC JUNCTION STONE  . Cystoscopy with retrograde pyelogram, ureteroscopy and stent placement Right 01/29/2013    Procedure: CYSTOSCOPY WITH RIGHT URETEROSCOPY AND STENT PLACEMENT;  Surgeon: Garnett Farm, MD;  Location: [facility];  Service: Urology;  Laterality: Right;  . Holmium laser application N/A 01/29/2013    Procedure: HOLMIUM LASER APPLICATION;  Surgeon: Garnett Farm, MD;  Location: [facility];  Service: Urology;  Laterality: N/A;   Dg Chest 2 View  02/12/2013   CLINICAL DATA:  Stent placement. Rectal bleeding. Fatigue.  EXAM: CHEST  2 VIEW  COMPARISON:  10/24/2012  FINDINGS: Stable moderate cardiomegaly. Tortuous and atherosclerotic thoracic aorta. Prior median sternotomy.  Minimal bibasilar scarring. No pleural effusion noted. Thoracic spondylosis is present.  IMPRESSION: 1. Stable moderate cardiomegaly with tortuous and atherosclerotic thoracic aorta. 2. Stable scarring in the lung bases.   Electronically Signed   By: Herbie Baltimore M.D.   On: 02/12/2013 15:28   [facility]ub Day Of Procedure  01/29/2013   CLINICAL DATA:  Preop for proximal right ureteral stone.  EXAM: ABDOMEN - 1 VIEW  COMPARISON:  One-view abdomen 12/29/2012 and 01/09/2013.  FINDINGS: The right-sided ureteral stent is stable in position. The previously seen proximal stone is not visualized on today's exam. Vascular stents and coils are again noted. Atherosclerotic changes are evident.  IMPRESSION: 1. Right ureteral stent in situ. 2. The previously noted proximal stone is not evident on this radiograph. 3. Stable vascular stents and coils.   Electronically Signed   By: Gennette Pac M.D.   On: 01/29/2013 10:13   US Renal  02/12/2013   CLINICAL DATA:  Elevated creatinine.  EXAM: RENAL/URINARY TRACT ULTRASOUND COMPLETE  COMPARISON:  CT scan from 10/18/2012  FINDINGS: Right Kidney  Length: 19.6 cm. There is a large cystic  process in the right renal hilum consistent with marked hydronephrosis, as seen on the previous CT scan.  Left Kidney  Length: 16.0 cm. Moderate left hydronephrosis is evident, but appearance is substantially improved compared to the previous CT scan.  Bladder  Foley balloon is identified within the decompressed urinary bladder.  Abdominal aortic aneurysm measures 4.8 cm.  IMPRESSION: Marked right hydronephrosis, seen on the previous CT scan. There is moderate left hydronephrosis, improved substantially compared to the prior CT scan.  Abdominal aortic aneurysm.   Electronically Signed   By: Kennith Center M.D.   On: 02/12/2013 16:17   Dg C-arm 61-120 Min-no Report  01/29/2013   CLINICAL DATA: intra op   C-ARM 61-120 MINUTES  Fluoroscopy was utilized by the requesting physician.  No radiographic  interpretation.   Results for orders placed during the  hospital encounter of 02/12/13  MRSA PCR SCREENING      Result Value Range   MRSA by PCR NEGATIVE  NEGATIVE  CULTURE, BLOOD (ROUTINE X 2)      Result Value Range   Specimen Description BLOOD LEFT HAND     Special Requests BOTTLES DRAWN AEROBIC AND ANAEROBIC 10CC     Culture  Setup Time       Value: 02/12/2013 21:53     Performed at [facility]   Culture       Value:        BLOOD CULTURE RECEIVED NO GROWTH TO DATE CULTURE WILL BE HELD FOR 5 DAYS BEFORE ISSUING A FINAL NEGATIVE REPORT     Performed at [facility]   Report Status PENDING    CULTURE, BLOOD (ROUTINE X 2)      Result Value Range   Specimen Description BLOOD LEFT ARM     Special Requests BOTTLES DRAWN AEROBIC AND ANAEROBIC 10CC     Culture  Setup Time       Value: 02/12/2013 21:53     Performed at [facility]   Culture       Value:        BLOOD CULTURE RECEIVED NO GROWTH TO DATE CULTURE WILL BE HELD FOR 5 DAYS BEFORE ISSUING A FINAL NEGATIVE REPORT     Performed at [facility]   Report Status PENDING    CBC      Result Value Range   WBC 13.0 (*)  4.0 - 10.5 K/uL   RBC 3.77 (*) 4.22 - 5.81 MIL/uL   Hemoglobin 11.7 (*) 13.0 - 17.0 g/dL   HCT 13.0 (*) 86.5 - 78.4 %   MCV 93.1  78.0 - 100.0 fL   MCH 31.0  26.0 - 34.0 pg   MCHC 33.3  30.0 - 36.0 g/dL   RDW 69.6  29.5 - 28.4 %   Platelets 209  150 - 400 K/uL  COMPREHENSIVE METABOLIC PANEL      Result Value Range   Sodium 134 (*) 135 - 145 mEq/L   Potassium 5.2 (*) 3.5 - 5.1 mEq/L   Chloride 100  96 - 112 mEq/L   CO2 16 (*) 19 - 32 mEq/L   Glucose, Bld 108 (*) 70 - 99 mg/dL   BUN 132 (*) 6 - 23 mg/dL   Creatinine, Ser 4.40 (*) 0.50 - 1.35 mg/dL   Calcium 8.9  8.4 - 10.2 mg/dL   Total Protein 7.1  6.0 - 8.3 g/dL   Albumin 3.1 (*) 3.5 - 5.2 g/dL   AST 10  0 - 37 U/L   ALT 8  0 - 53 U/L   Alkaline Phosphatase 126 (*) 39 - 117 U/L   Total Bilirubin 0.5  0.3 - 1.2 mg/dL   GFR calc non Af Amer 8 (*) >90 mL/min   GFR calc Af Amer 10 (*) >90 mL/min  PROTIME-INR      Result Value Range   Prothrombin Time 15.7 (*) 11.6 - 15.2 seconds   INR 1.28  0.00 - 1.49  APTT      Result Value Range   aPTT 45 (*) 24 - 37 seconds  URINALYSIS, ROUTINE W REFLEX MICROSCOPIC      Result Value Range   Color, Urine ORANGE (*) YELLOW   APPearance TURBID (*) CLEAR   Specific Gravity, Urine 1.023  1.005 - 1.030   pH 5.0  5.0 - 8.0   Glucose, UA  NEGATIVE  NEGATIVE mg/dL   Hgb urine dipstick LARGE (*) NEGATIVE   Bilirubin Urine SMALL (*) NEGATIVE   Ketones, ur NEGATIVE  NEGATIVE mg/dL   Protein, ur 846 (*) NEGATIVE mg/dL   Urobilinogen, UA 1.0  0.0 - 1.0 mg/dL   Nitrite POSITIVE (*) NEGATIVE   Leukocytes, UA LARGE (*) NEGATIVE  DIGOXIN LEVEL      Result Value Range   Digoxin Level 0.9  0.8 - 2.0 ng/mL  URINE MICROSCOPIC-ADD ON      Result Value Range   WBC, UA 11-20  <3 WBC/hpf   RBC / HPF 11-20  <3 RBC/hpf   Bacteria, UA RARE  RARE  CBC      Result Value Range   WBC 10.6 (*) 4.0 - 10.5 K/uL   RBC 3.43 (*) 4.22 - 5.81 MIL/uL   Hemoglobin 10.7 (*) 13.0 - 17.0 g/dL   HCT 96.2 (*) 95.2 - 84.1  %   MCV 92.1  78.0 - 100.0 fL   MCH 31.2  26.0 - 34.0 pg   MCHC 33.9  30.0 - 36.0 g/dL   RDW 32.4  40.1 - 02.7 %   Platelets 199  150 - 400 K/uL  HEMOGLOBIN AND HEMATOCRIT, BLOOD      Result Value Range   Hemoglobin 10.8 (*) 13.0 - 17.0 g/dL   HCT 25.3 (*) 66.4 - 40.3 %  HEMOGLOBIN AND HEMATOCRIT, BLOOD      Result Value Range   Hemoglobin 10.2 (*) 13.0 - 17.0 g/dL   HCT 47.4 (*) 25.9 - 56.3 %  BASIC METABOLIC PANEL      Result Value Range   Sodium 134 (*) 135 - 145 mEq/L   Potassium 4.1  3.5 - 5.1 mEq/L   Chloride 99  96 - 112 mEq/L   CO2 17 (*) 19 - 32 mEq/L   Glucose, Bld 96  70 - 99 mg/dL   BUN 875 (*) 6 - 23 mg/dL   Creatinine, Ser 6.43 (*) 0.50 - 1.35 mg/dL   Calcium 8.7  8.4 - 32.9 mg/dL   GFR calc non Af Amer 9 (*) >90 mL/min   GFR calc Af Amer 10 (*) >90 mL/min  LACTIC ACID, PLASMA      Result Value Range   Lactic Acid, Venous 0.9  0.5 - 2.2 mmol/L  PROCALCITONIN      Result Value Range   Procalcitonin 0.94    HEMOGLOBIN AND HEMATOCRIT, BLOOD      Result Value Range   Hemoglobin 10.8 (*) 13.0 - 17.0 g/dL   HCT 51.8 (*) 84.1 - 66.0 %  OCCULT BLOOD, POC DEVICE      Result Value Range   Fecal Occult Bld POSITIVE (*) NEGATIVE  TYPE AND SCREEN      Result Value Range   ABO/RH(D) O POS     Antibody Screen NEG     Sample Expiration 02/15/2013    ABO/RH      Result Value Range   ABO/RH(D) O POS    A/P: Pt with hx of chronic bilateral hydronephrosis secondary to UPJ/ureteral stones now more pronounced on right, worsening of chronic renal failure and right flank pain. Tent plan is for placement of a right percutaneous nephrostomy tube on 10/22. Details/risks of procedure d/w pt with his understanding and consent.

## 2013-02-13 NOTE — Progress Notes (Signed)
CARE MANAGEMENT NOTE 02/13/2013  Patient:  [name], [name]   Account Number:  [id number]  Date Initiated:  02/13/2013  Documentation initiated by:  DAVIS,RHONDA  Subjective/Objective Assessment:   multiple medical problems, presents with poss urosepsis and hypotension,     Action/Plan:   home when stable   Anticipated DC Date:  02/16/2013   Anticipated DC Plan:  HOME/SELF CARE  In-house referral  NA      DC Planning Services  NA      [facility] Choice  NA   Choice offered to / List presented to:  NA   DME arranged  NA      DME agency  NA     HH arranged  NA      HH agency  NA   Status of service:  In process, will continue to follow Medicare Important Message given?  NA - LOS <3 / Initial given by admissions (If response is "NO", the following Medicare IM given date fields will be blank) Date Medicare IM given:   Date Additional Medicare IM given:    Discharge Disposition:    Per UR Regulation:  Reviewed for med. necessity/level of care/duration of stay  If discussed at Long Length of Stay Meetings, dates discussed:    Comments:  [id number]/Rhonda Stark Jock, BSN, [place] [phone] Chart Reviewed for discharge and hospital needs. Discharge needs at time of review:  None Review of patient progress due on [id number].

## 2013-02-13 NOTE — Consult Note (Signed)
   Consultation  Referring Provider:  [facility]    Primary Care Physician:  LUKING,W S, MD Primary Gastroenterologist: none     Reason for Consultation: GI bleed             HPI:   [name] is a 79 y.o. male with multiple medical problems. He takes coumadin for atrial fibrillation but has been off for several days for urologic procedures. Patient admitted last evening with melena, acute on chronic kidney failure. He was hypotensive and tachycardic in ED. INR not elevated.   [name] recently underwent removal of kidney stones / stent placement. He tells me that last week he had another urologic procedure with removal of more stones. A day or so following the procedure patient developed right flank pain. Pain was constant, as "bad as a kidney stone" but a different kind of pain. During this time patient was also constipated. He took Miralax and subsequently passed a black stool. He passed a couple more black stools over the following couple of days. No bismuth or iron products. Patient presented to ED yesterday feeling lightheaded. He was incontinent of loose yellow stool yesterday.  Per RN, he had a small brown stool last night.   No chronic GI problems. No NSAID use.   Past Medical History  Diagnosis Date  . Hyperlipidemia   . White coat hypertension   . Cardiomyopathy      EF:30-40% in 06/1998, but 50-55% in 2009;  HX ISCHEMIC CARDIOMYOPATHY  . Chronic atrial fibrillation   . Vitamin B12 deficiency   . Chronic anticoagulation   . Arthritis   . Sensory neuropathy   . Chronic kidney disease, stage III (moderate)     creatinine 1.5 2001, 2.0 2009, 2.28 in 12/2009  . Mild renal insufficiency   . Hydronephrosis, bilateral   . Ureteral calculi     BILATERAL  . History of CHF (congestive heart failure)     1999  &  2005  . History of kidney stones   . Bilateral renal cysts   . Aneurysm of infrarenal abdominal aorta 3.9 x 3.6 no change 8/05 no change in 2010;  4.7 in 2013     MONITORED BY DICKSON-- LAST VISIT OCT 2013--  4.7CM  . Chronic venous insufficiency   . PAD (peripheral artery disease)   . Edema of both legs   . History of gout     per pt stable  . Glaucoma of both eyes   . Wears hearing aid     bilateral    Past Surgical History  Procedure Laterality Date  . Repair thoracic aorta  01/1995    s/p rupture  . Cataract extraction w/ intraocular lens  implant, bilateral  2012  . 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 Laparoscopic cholecystectomy  JAN 2005  . Percutaneous nephrostolithotomy  1970's  . Abdominal aortagram  07-16-2003  DR ROTHBART    W/ COIL EMBOLIZATION OF SIX BRANCHES OF RIGHT INTERNAL RENAL ARTERY ANEURYSM  . Cardiovascular stress test  04-25-2003    LOW RISK CARDIOLITE STUDY/ MODERATE LV DILATATION AND IMPAIRMENT OF LVSF/  NO ISCHEMIA  . Transthoracic echocardiogram  02-07-2008  DR ROTHBART    MODERATE DILATED LV/  EF 50-55%/ MILD AR/ MILD TO MODERATE AORTIC ROOT DILATATION/ MODERATE MR/ MODERATE LA   &  RA   DIILATED/ MILD TO MODERATE TR  . Cystoscopy w/ ureteral stent placement Bilateral 10/24/2012      Procedure: CYSTOSCOPY WITH RETROGRADE PYELOGRAM/URETERAL STENT PLACEMENT;  Surgeon: Marc-Henry Nesi, MD;  Location: [facility];  Service: Urology;  Laterality: Bilateral;  . Cystoscopy with ureteroscopy Left 11/27/2012    Procedure: CYSTOSCOPY,LEFT URETEROSCOPY WITH LASER LITHOTRIPSY/REMOVAL OF MIGRATED STENT, PLACEMENT OF LEFT STENT;  Surgeon: Mark C Ottelin, MD;  Location: [facility];  Service: Urology;  Laterality: Left;  . Holmium laser application Left 11/27/2012    Procedure: HOLMIUM LASER APPLICATION;  Surgeon: Mark C Ottelin, MD;  Location: [facility];  Service: Urology;  Laterality: Left;  . Cystoscopy with ureteroscopy and stent placement N/A 12/29/2012    Procedure: CYSTOSCOPY WITH URETEROSCOPY AND STENT PLACEMENT, removal of right and left stents, retrogrades, right  stent exchange;  Surgeon: Mark C Ottelin, MD;  Location: [facility];  Service: Urology;  Laterality: N/A;  . Holmium laser application Right 12/29/2012    Procedure: HOLMIUM LASER APPLICATION;  Surgeon: Mark C Ottelin, MD;  Location: [facility];  Service: Urology;  Laterality: Right;  HLL OF (RT) URETERAL PELVIC JUNCTION STONE  . Cystoscopy with retrograde pyelogram, ureteroscopy and stent placement Right 01/29/2013    Procedure: CYSTOSCOPY WITH RIGHT URETEROSCOPY AND STENT PLACEMENT;  Surgeon: Mark C Ottelin, MD;  Location: [facility];  Service: Urology;  Laterality: Right;  . Holmium laser application N/A 01/29/2013    Procedure: HOLMIUM LASER APPLICATION;  Surgeon: Mark C Ottelin, MD;  Location: [facility];  Service: Urology;  Laterality: N/A;    Family History  Problem Relation Age of Onset  . Heart disease Mother   . Early death Father   . Aneurysm Brother   . Aortic aneurysm Brother   . Diabetes Sister    No colon cancer.   History  Substance Use Topics  . Smoking status: Never Smoker   . Smokeless tobacco: Never Used  . Alcohol Use: No    Prior to Admission medications   Medication Sig Start Date End Date Taking? Authorizing Provider  acetaminophen (TYLENOL) 500 MG tablet Take 1,000 mg by mouth every 6 (six) hours as needed for pain.   Yes Historical Provider, MD  amLODipine (NORVASC) 5 MG tablet Take 5 mg by mouth every morning.  04/27/12  Yes Robert M Rothbart, MD  Cyanocobalamin (VITAMIN B-12) 2000 MCG TBCR Take 2,000 mcg by mouth daily.    Yes Historical Provider, MD  digoxin (DIGOX) 0.125 MG tablet Take 0.125 mg by mouth every morning. Take 1 tablet by mouth Monday through Friday. 10/02/12  Yes Robert M Rothbart, MD  doxazosin (CARDURA) 4 MG tablet Take 4 mg by mouth at bedtime.   Yes Historical Provider, MD  furosemide (LASIX) 40 MG tablet Take 40 mg by mouth every morning.  04/27/12  Yes Robert M Rothbart, MD  glucosamine-chondroitin 500-400 MG tablet Take 2 tablets by mouth daily.    Yes  Historical Provider, MD  HYDROcodone-acetaminophen (NORCO) 10-325 MG per tablet Take 1-2 tablets by mouth every 4 (four) hours as needed for pain. 01/29/13  Yes Mark C Ottelin, MD  metoprolol (LOPRESSOR) 100 MG tablet Take 100 mg by mouth 2 (two) times daily.   Yes Historical Provider, MD  ondansetron (ZOFRAN-ODT) 4 MG disintegrating tablet Take 4 mg by mouth every 8 (eight) hours as needed for nausea.   Yes Historical Provider, MD  phenazopyridine (PYRIDIUM) 200 MG tablet Take 1 tablet (200 mg total) by mouth 3 (three) times daily as needed for pain. 01/29/13  Yes Mark C Ottelin, MD  simvastatin (ZOCOR) 20 MG tablet Take 20 mg by   mouth daily.    Yes Historical Provider, MD  latanoprost (XALATAN) 0.005 % ophthalmic solution Place 1 drop into both eyes at bedtime.  08/12/12   Historical Provider, MD  warfarin (COUMADIN) 2.5 MG tablet Take 2.5 mg by mouth See admin instructions. Takes 6 days per week with exception not on wednesday    Historical Provider, MD    Current Facility-Administered Medications  Medication Dose Route Frequency Provider Last Rate Last Dose  . 0.9 %  sodium chloride infusion   Intravenous Continuous Stephen K Chiu, MD 100 mL/hr at 02/13/13 0917 1,000 mL at 02/13/13 0917  . acetaminophen (TYLENOL) tablet 650 mg  650 mg Oral Q6H PRN Adeline C Viyuoh, MD       Or  . acetaminophen (TYLENOL) suppository 650 mg  650 mg Rectal Q6H PRN Adeline C Viyuoh, MD      . cefTRIAXone (ROCEPHIN) 1 g in dextrose 5 % 50 mL IVPB  1 g Intravenous Daily Adeline C Viyuoh, MD   1 g at 02/12/13 2010  . [START ON 02/14/2013] digoxin (LANOXIN) tablet 0.125 mg  0.125 mg Oral QODAY Adeline C Viyuoh, MD      . HYDROcodone-acetaminophen (NORCO) 10-325 MG per tablet 1-2 tablet  1-2 tablet Oral Q6H PRN Adeline C Viyuoh, MD      . latanoprost (XALATAN) 0.005 % ophthalmic solution 1 drop  1 drop Both Eyes QHS Adeline C Viyuoh, MD   1 drop at 02/12/13 2300  . metoprolol (LOPRESSOR) injection 2.5-5 mg  2.5-5 mg  Intravenous Q6H PRN Adeline C Viyuoh, MD      . ondansetron (ZOFRAN) tablet 4 mg  4 mg Oral Q6H PRN Adeline C Viyuoh, MD       Or  . ondansetron (ZOFRAN) injection 4 mg  4 mg Intravenous Q6H PRN Adeline C Viyuoh, MD      . sodium chloride 0.225 % with sodium bicarbonate 100 mEq infusion   Intravenous Continuous Adeline C Viyuoh, MD 100 mL/hr at 02/13/13 0530    . sodium chloride 0.9 % injection 3 mL  3 mL Intravenous Q12H Adeline C Viyuoh, MD   3 mL at 02/12/13 2300  . vitamin B-12 (CYANOCOBALAMIN) tablet 2,000 mcg  2,000 mcg Oral Daily Adeline C Viyuoh, MD        Allergies as of 02/12/2013 - Review Complete 02/12/2013  Allergen Reaction Noted  . Sulfa antibiotics Rash 02/02/2012    Review of Systems:    All systems reviewed and negative except where noted in HPI.   Physical Exam:  Vital signs in last 24 hours: Temp:  [97.8 F (36.6 C)-98.2 F (36.8 C)] 98.1 F (36.7 C) (10/21 0814) Pulse Rate:  [68-139] 101 (10/21 0900) Resp:  [11-25] 18 (10/21 0900) BP: (65-155)/(36-92) 103/63 mmHg (10/21 0900) SpO2:  [92 %-100 %] 94 % (10/21 0900) Weight:  [178 lb 9.2 oz (81 kg)] 178 lb 9.2 oz (81 kg) (10/20 2000) Last BM Date: 02/12/13 General:   Pleasant white male in NAD Head:  Normocephalic and atraumatic. Eyes:   No icterus.   Conjunctiva pink. Ears:  Normal auditory acuity. Neck:  Supple; no masses felt Lungs:  Respirations even and unlabored. Lungs clear to auscultation bilaterally.   No wheezes, crackles, or rhonchi.  Heart:  Regular rate and rhythm;  murmur heard. Abdomen:  Soft, nondistended, nontender. Normal bowel sounds. No appreciable masses or hepatomegaly. There is a tender right bulging on right side (below rib cage) in area of old surgical scar. Rectal:  Scant   light brown stool in vault.  Msk:  Symmetrical without gross deformities.  Extremities:  Without edema. Neurologic:  Alert and  oriented x4;  grossly normal neurologically. Skin:  Intact without significant lesions  or rashes. Cervical Nodes:  No significant cervical adenopathy. Psych:  Alert and cooperative. Normal affect.  LAB RESULTS:  Recent Labs  02/12/13 1410 02/12/13 1848 02/12/13 2336 02/13/13 0515  WBC 13.0*  --   --  10.6*  HGB 11.7* 10.8* 10.2* 10.7*  HCT 35.1* 32.3* 30.4* 31.6*  PLT 209  --   --  199   BMET  Recent Labs  02/12/13 1410 02/13/13 0515  NA 134* 134*  K 5.2* 4.1  CL 100 99  CO2 16* 17*  GLUCOSE 108* 96  BUN 106* 110*  CREATININE 5.52* 5.24*  CALCIUM 8.9 8.7   LFT  Recent Labs  02/12/13 1410  PROT 7.1  ALBUMIN 3.1*  AST 10  ALT 8  ALKPHOS 126*  BILITOT 0.5   PT/INR  Recent Labs  02/12/13 1410  LABPROT 15.7*  INR 1.28    STUDIES: Dg Chest 2 View  02/12/2013   CLINICAL DATA:  Stent placement. Rectal bleeding. Fatigue.  EXAM: CHEST  2 VIEW  COMPARISON:  10/24/2012  FINDINGS: Stable moderate cardiomegaly. Tortuous and atherosclerotic thoracic aorta. Prior median sternotomy.  Minimal bibasilar scarring. No pleural effusion noted. Thoracic spondylosis is present.  IMPRESSION: 1. Stable moderate cardiomegaly with tortuous and atherosclerotic thoracic aorta. 2. Stable scarring in the lung bases.   Electronically Signed   By: Walt  Liebkemann M.D.   On: 02/12/2013 15:28   Us Renal  02/12/2013   CLINICAL DATA:  Elevated creatinine.  EXAM: RENAL/URINARY TRACT ULTRASOUND COMPLETE  COMPARISON:  CT scan from 10/18/2012  FINDINGS: Right Kidney  Length: 19.6 cm. There is a large cystic process in the right renal hilum consistent with marked hydronephrosis, as seen on the previous CT scan.  Left Kidney  Length: 16.0 cm. Moderate left hydronephrosis is evident, but appearance is substantially improved compared to the previous CT scan.  Bladder  Foley balloon is identified within the decompressed urinary bladder.  Abdominal aortic aneurysm measures 4.8 cm.  IMPRESSION: Marked right hydronephrosis, seen on the previous CT scan. There is moderate left hydronephrosis,  improved substantially compared to the prior CT scan.  Abdominal aortic aneurysm.   Electronically Signed   By: Eric  Mansell M.D.   On: 02/12/2013 16:17   PREVIOUS ENDOSCOPIES:            none   Impression / Plan:   1. 79 year old male with passage of black stool last week. Stool heme positive n ED yesterday but no overt bleeding yesterday or today (stool yellow-brown). Though hemoglobin has been stable since admission (10.2 to 10.8), overall hgb down about 2 grams from baseline of mid 12 range. After resolution of acute medical issues patient may need EGD. Recommend BID PPI and close monitoring for now.    2. Acute right sided pain. There is a a tender bulge on right side which patient reports as new. Renal ultrasound shows a large cystic process in the right renal hilum consistent with marked hydronephrosis.  Urology to see today. Patient is s/p right ureteral stones extraction, lithotripsy and right double J-stent placement 01/29/13.  3. UTI. On Rocephin.   4. Sepsis syndrome. Workup in progress. Rule out urologic process.   5. Multiple medical problems. Patient's home coumadin has been on hold for several days for urologic procedures.      6. AAA, 4.8cm  Thanks   LOS: 1 day   Paula Guenther  02/13/2013, 10:17 AM    Attending physician's note   I have taken a history, examined the patient and reviewed the chart. I agree with the Advanced Practitioner's note, impression and recommendations. Possible melena last week and now has yellow-brown heme + stool. 2 drop in Hb noted. Multiple comorbidities with acute on chronic renal failure, UTI, kidney stone, possible sepsis that need more urgent attention than GI problem. PPI BID for now. Monitor Hb/Hct. EGD in near future pending hospital course.   Dominick Zertuche T Amandalynn Pitz, MD FACG  

## 2013-02-13 NOTE — Progress Notes (Addendum)
[facility] PROGRESS NOTE  [name] JWJ:[id number] DOB: 02/22/1926 DOA: 02/12/2013 PCP: [name], MD  Assessment/Plan: Melena/GIB  -Remains off coumadin with unremarkable INR -serial H/H stable -consulted GI, for further recs  UTI  -obtain urine and blood cultures, empiric abx with rocephin pending cultures  Sepsis syndrome /Hypotension  -blood and urine cultures as above, empiric abx  -Presently afebrile -continue with fluid resuscitation as tolerated Atrial fibrillation, chronic  -continue dig- renal dose  -holding metoprolol due to hypotension, monitor and further treat as appropriate  -remain off coumadin due to GI bleed Acute renal failure on Chronic kidney disease, stage III (moderate)  -secondary to hydronephrosis-renal US as above, and #1also contributing  -hydrate with IVF/bicarb as above  -pt has foley in place, urology to follow  -Cr slightly improved this AM. Would continue IVF as tolerated Hyperkalemia/metab acidosis  -hydrating with ivf/bicarb as above, follow and recheck  -due to ARF, as discussed above follow and consult renal if refactory.  CM/CHF  -compensated, monitor fluid status closely and further treat as appropriate. - last documented EF of 50-55% on 10/09. Hydronephrosis - Discussed case with Dr. [name] and very much appreciate input. - Stent recently removed as per H and P but given anatomy, re-stenting per Urology is not feasible - Therefore recs for IR guided percutaneous nephrostomy tube on the R was recommended - IR consulted. Will follow  Code Status: Full Family Communication: Pt in room (indicate person spoken with, relationship, and if by phone, the number) Disposition Plan: Pending   Consultants:  GI  Antibiotics:  Rocephin (indicate start date, and stop date if known)  HPI/Subjective: No acute events noted overnight  Objective: Filed Vitals:   02/13/13 0300 02/13/13 0400 02/13/13 0500 02/13/13 0600  BP: 102/49  91/54 113/61 101/54  Pulse: 80 85 83 83  Temp:      TempSrc:      Resp: 17 20 15 17   Height:      Weight:      SpO2: 95% 93% 95% 92%    Intake/Output Summary (Last 24 hours) at 02/13/13 0752 Last data filed at 02/13/13 0600  Gross per 24 hour  Intake   1400 ml  Output    575 ml  Net    825 ml   Filed Weights   02/12/13 2000  Weight: 81 kg (178 lb 9.2 oz)    Exam:   General:  Awake, in nad  Cardiovascular: regular, s1, s2  Respiratory: normal resp effort, no wheezing  Abdomen: soft, nondistended  Musculoskeletal: perfused, no clubbing   Data Reviewed: Basic Metabolic Panel:  Recent Labs Lab 02/12/13 1410 02/13/13 0515  NA 134* 134*  K 5.2* 4.1  CL 100 99  CO2 16* 17*  GLUCOSE 108* 96  BUN 106* 110*  CREATININE 5.52* 5.24*  CALCIUM 8.9 8.7   Liver Function Tests:  Recent Labs Lab 02/12/13 1410  AST 10  ALT 8  ALKPHOS 126*  BILITOT 0.5  PROT 7.1  ALBUMIN 3.1*   No results found for this basename: LIPASE, AMYLASE,  in the last 168 hours No results found for this basename: AMMONIA,  in the last 168 hours CBC:  Recent Labs Lab 02/12/13 1410 02/12/13 1848 02/12/13 2336 02/13/13 0515  WBC 13.0*  --   --  10.6*  HGB 11.7* 10.8* 10.2* 10.7*  HCT 35.1* 32.3* 30.4* 31.6*  MCV 93.1  --   --  92.1  PLT 209  --   --  199  Cardiac Enzymes: No results found for this basename: CKTOTAL, CKMB, CKMBINDEX, TROPONINI,  in the last 168 hours BNP (last 3 results) No results found for this basename: PROBNP,  in the last 8760 hours CBG: No results found for this basename: GLUCAP,  in the last 168 hours  Recent Results (from the past 240 hour(s))  MRSA PCR SCREENING     Status: None   Collection Time    02/12/13  5:24 PM      Result Value Range Status   MRSA by PCR NEGATIVE  NEGATIVE Final   Comment:            The GeneXpert MRSA Assay (FDA     approved for NASAL specimens     only), is one component of a     comprehensive MRSA colonization      surveillance program. It is not     intended to diagnose MRSA     infection nor to guide or     monitor treatment for     MRSA infections.     Studies: Dg Chest 2 View  02/12/2013   CLINICAL DATA:  Stent placement. Rectal bleeding. Fatigue.  EXAM: CHEST  2 VIEW  COMPARISON:  10/24/2012  FINDINGS: Stable moderate cardiomegaly. Tortuous and atherosclerotic thoracic aorta. Prior median sternotomy.  Minimal bibasilar scarring. No pleural effusion noted. Thoracic spondylosis is present.  IMPRESSION: 1. Stable moderate cardiomegaly with tortuous and atherosclerotic thoracic aorta. 2. Stable scarring in the lung bases.   Electronically Signed   By: Herbie Baltimore M.D.   On: 02/12/2013 15:28   US Renal  02/12/2013   CLINICAL DATA:  Elevated creatinine.  EXAM: RENAL/URINARY TRACT ULTRASOUND COMPLETE  COMPARISON:  CT scan from 10/18/2012  FINDINGS: Right Kidney  Length: 19.6 cm. There is a large cystic process in the right renal hilum consistent with marked hydronephrosis, as seen on the previous CT scan.  Left Kidney  Length: 16.0 cm. Moderate left hydronephrosis is evident, but appearance is substantially improved compared to the previous CT scan.  Bladder  Foley balloon is identified within the decompressed urinary bladder.  Abdominal aortic aneurysm measures 4.8 cm.  IMPRESSION: Marked right hydronephrosis, seen on the previous CT scan. There is moderate left hydronephrosis, improved substantially compared to the prior CT scan.  Abdominal aortic aneurysm.   Electronically Signed   By: Kennith Center M.D.   On: 02/12/2013 16:17    Scheduled Meds: . cefTRIAXone (ROCEPHIN)  IV  1 g Intravenous Daily  . [START ON 02/14/2013] digoxin  0.125 mg Oral QODAY  . latanoprost  1 drop Both Eyes QHS  . sodium chloride  3 mL Intravenous Q12H  . vitamin B-12  2,000 mcg Oral Daily   Continuous Infusions: .  sodium bicarbonate infusion 1/4 NS 1000 mL 100 mL/hr at 02/13/13 0530    Active Problems:   Atrial  fibrillation, chronic   Chronic anticoagulation   Chronic kidney disease, stage III (moderate)   Acute renal failure   Hypotension   Melena   Anemia   Urinary tract infection, site not specified   Sepsis  Time spent:  CHIU, STEPHEN K  [facility] Pager [phone]. If 7PM-7AM, please contact night-coverage at [email or web address], password [facility] 02/13/2013, 7:52 AM  LOS: 1 day

## 2013-02-13 NOTE — Progress Notes (Signed)
INITIAL NUTRITION ASSESSMENT  DOCUMENTATION CODES Per approved criteria  -Not Applicable   INTERVENTION: Diet advancement per MD discretion Provide Snacks BID once diet advanced Provide Ensure Complete once daily when diet advanced  NUTRITION DIAGNOSIS: Unintentional weight loss related to medical condition as evidenced by 13% wt loss in less than 4 months.   Goal: Pt to meet >/= 90% of their estimated nutrition needs   Monitor:  Diet advancement PO intake Weight Labs  Reason for Assessment: Malnutrition Screening Tool, score of 3  79 y.o. male  Admitting Dx: <principal problem not specified>  ASSESSMENT: 79 y.o. male with multiple medical problems as listed below including chronic afib on coumadin- but has been off (the coumadin) for about 10 days or so for his urologic procedures per Dr [name], CKD stage 3, bilateral hydronephrosis, ureteral calculi, recently hospitalized (10/6) for Right ureteral stone extraction/Right ureteral stent removal/Right double-J stent placement and followed up last week for stent removal per urology and presents with the complaints of melanotic stools x 3days. Pt reports that he was eating well PTA with a good appetite but, has noticed he has lost weight. He states he eats 3 meals daily but, has been eating less at breakfast recently and has always eaten a small dinner. Pt states he used to weigh 240 lbs but, more recently has been weighing around 200 lbs. Recent weight is 178 lbs and weight history shows 6 lb wt loss this past month (3% wt loss). Pt is unsure why he has lost weight. Encouraged pt to add 1 to 2 snacks daily to help prevent further weight loss. Encouraged nutritional supplements/shakes if pt continue to lose weight. Pt is currently NPO; states he does not have an appetite due to abdominal pain.   Height: Ht Readings from Last 1 Encounters:  02/12/13 5\' 10"  (1.778 m)    Weight: Wt Readings from Last 1 Encounters:  02/12/13 178 lb  9.2 oz (81 kg)    Ideal Body Weight: 166 lbs  % Ideal Body Weight: 107%  Wt Readings from Last 10 Encounters:  02/12/13 178 lb 9.2 oz (81 kg)  02/01/13 191 lb (86.637 kg)  01/29/13 184 lb 9.6 oz (83.734 kg)  01/29/13 184 lb 9.6 oz (83.734 kg)  12/21/12 196 lb (88.905 kg)  11/27/12 190 lb 2 oz (86.24 kg)  11/27/12 190 lb 2 oz (86.24 kg)  11/01/12 201 lb 4 oz (91.286 kg)  10/24/12 204 lb (92.534 kg)  10/24/12 204 lb (92.534 kg)    Usual Body Weight: 204 lbs   % Usual Body Weight: 87%  BMI:  Body mass index is 25.62 kg/(m^2).  Estimated Nutritional Needs: Kcal: 1800-2000 Protein: 85-95 grams Fluid: 2.4 L/day  Skin: WDL  Diet Order: NPO  EDUCATION NEEDS: -No education needs identified at this time   Intake/Output Summary (Last 24 hours) at 02/13/13 1402 Last data filed at 02/13/13 1300  Gross per 24 hour  Intake 2521.67 ml  Output    575 ml  Net 1946.67 ml    Last BM: 10/20  Labs:   Recent Labs Lab 02/12/13 1410 02/13/13 0515  NA 134* 134*  K 5.2* 4.1  CL 100 99  CO2 16* 17*  BUN 106* 110*  CREATININE 5.52* 5.24*  CALCIUM 8.9 8.7  GLUCOSE 108* 96    CBG (last 3)  No results found for this basename: GLUCAP,  in the last 72 hours  Scheduled Meds: . cefTRIAXone (ROCEPHIN)  IV  1 g Intravenous Daily  . [  START ON 02/14/2013] digoxin  0.125 mg Oral QODAY  . latanoprost  1 drop Both Eyes QHS  . pantoprazole  40 mg Oral BID  . sodium chloride  3 mL Intravenous Q12H  . vitamin B-12  2,000 mcg Oral Daily    Continuous Infusions: . sodium chloride 1,000 mL (02/13/13 0917)  .  sodium bicarbonate infusion 1/4 NS 1000 mL 100 mL/hr at 02/13/13 0530    Past Medical History  Diagnosis Date  . Hyperlipidemia   . White coat hypertension   . Cardiomyopathy      EF:30-40% in 06/1998, but 50-55% in 2009;  HX ISCHEMIC CARDIOMYOPATHY  . Chronic atrial fibrillation   . Vitamin B12 deficiency   . Chronic anticoagulation   . Arthritis   . Sensory  neuropathy   . Chronic kidney disease, stage III (moderate)     creatinine 1.5 2001, 2.0 2009, 2.28 in 12/2009  . Mild renal insufficiency   . Hydronephrosis, bilateral   . Ureteral calculi     BILATERAL  . History of CHF (congestive heart failure)     1999  &  2005  . History of kidney stones   . Bilateral renal cysts   . 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 Chronic venous insufficiency   . PAD (peripheral artery disease)   . Edema of both legs   . History of gout     per pt stable  . Glaucoma of both eyes   . Wears hearing aid     bilateral    Past Surgical History  Procedure Laterality Date  . Repair thoracic aorta  01/1995    s/p rupture  . Cataract extraction w/ intraocular lens  implant, bilateral  2012  . 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 Laparoscopic cholecystectomy  JAN 2005  . Percutaneous nephrostolithotomy  1970's  . Abdominal aortagram  07-16-2003  DR ROTHBART    W/ COIL EMBOLIZATION OF SIX BRANCHES OF RIGHT INTERNAL RENAL ARTERY ANEURYSM  . Cardiovascular stress test  04-25-2003    LOW RISK CARDIOLITE STUDY/ MODERATE LV DILATATION AND IMPAIRMENT OF LVSF/  NO ISCHEMIA  . Transthoracic echocardiogram  02-07-2008  DR ROTHBART    MODERATE DILATED LV/  EF 50-55%/ MILD AR/ MILD TO MODERATE AORTIC ROOT DILATATION/ MODERATE MR/ MODERATE LA   &  RA   DIILATED/ MILD TO MODERATE TR  . Cystoscopy w/ ureteral stent placement Bilateral 10/24/2012    Procedure: CYSTOSCOPY WITH RETROGRADE PYELOGRAM/URETERAL STENT PLACEMENT;  Surgeon: Lindaann Slough, MD;  Location: [facility] [place];  Service: Urology;  Laterality: Bilateral;  . Cystoscopy with ureteroscopy Left 11/27/2012    Procedure: CYSTOSCOPY,LEFT URETEROSCOPY WITH LASER LITHOTRIPSY/REMOVAL OF MIGRATED STENT, PLACEMENT OF LEFT STENT;  Surgeon: Garnett Farm, MD;  Location: [facility];  Service: Urology;  Laterality: Left;  . Holmium laser application Left 11/27/2012    Procedure: HOLMIUM LASER APPLICATION;  Surgeon: Garnett Farm, MD;  Location: [facility];  Service: Urology;  Laterality: Left;  . Cystoscopy with ureteroscopy and stent placement N/A 12/29/2012    Procedure: CYSTOSCOPY WITH URETEROSCOPY AND STENT PLACEMENT, removal of right and left stents, retrogrades, right stent exchange;  Surgeon: Garnett Farm, MD;  Location: [facility];  Service: Urology;  Laterality: N/A;  . Holmium laser application Right 12/29/2012  Procedure: HOLMIUM LASER APPLICATION;  Surgeon: Garnett Farm, MD;  Location: [facility];  Service: Urology;  Laterality: Right;  HLL OF (RT) URETERAL PELVIC JUNCTION STONE  . Cystoscopy with retrograde pyelogram, ureteroscopy and stent placement Right 01/29/2013    Procedure: CYSTOSCOPY WITH RIGHT URETEROSCOPY AND STENT PLACEMENT;  Surgeon: Garnett Farm, MD;  Location: [facility];  Service: Urology;  Laterality: Right;  . Holmium laser application N/A 01/29/2013    Procedure: HOLMIUM LASER APPLICATION;  Surgeon: Garnett Farm, MD;  Location: [facility];  Service: Urology;  Laterality: N/A;    Ian Malkin RD, LDN Inpatient Clinical Dietitian Pager: [phone] After Hours Pager: [phone]

## 2013-02-14 ENCOUNTER — Other Ambulatory Visit: Payer: Medicare Other

## 2013-02-14 ENCOUNTER — Ambulatory Visit: Payer: Medicare Other | Admitting: Vascular Surgery

## 2013-02-14 ENCOUNTER — Inpatient Hospital Stay (HOSPITAL_COMMUNITY): Payer: Medicare Other

## 2013-02-14 LAB — PROCALCITONIN: Procalcitonin: 0.44 ng/mL

## 2013-02-14 LAB — CBC WITH DIFFERENTIAL/PLATELET
Basophils Absolute: 0 10*3/uL (ref 0.0–0.1)
Eosinophils Absolute: 0.1 10*3/uL (ref 0.0–0.7)
Lymphocytes Relative: 6 % — ABNORMAL LOW (ref 12–46)
Lymphs Abs: 0.5 10*3/uL — ABNORMAL LOW (ref 0.7–4.0)
MCV: 91.9 fL (ref 78.0–100.0)
Neutro Abs: 7.3 10*3/uL (ref 1.7–7.7)
Neutrophils Relative %: 84 % — ABNORMAL HIGH (ref 43–77)
Platelets: 193 10*3/uL (ref 150–400)
RBC: 3.45 MIL/uL — ABNORMAL LOW (ref 4.22–5.81)
RDW: 13.9 % (ref 11.5–15.5)
WBC: 8.7 10*3/uL (ref 4.0–10.5)

## 2013-02-14 LAB — URINE CULTURE
Colony Count: NO GROWTH
Culture: NO GROWTH

## 2013-02-14 LAB — COMPREHENSIVE METABOLIC PANEL
ALT: 8 U/L (ref 0–53)
AST: 11 U/L (ref 0–37)
Alkaline Phosphatase: 77 U/L (ref 39–117)
CO2: 21 mEq/L (ref 19–32)
Chloride: 103 mEq/L (ref 96–112)
GFR calc Af Amer: 14 mL/min — ABNORMAL LOW (ref >90)
GFR calc non Af Amer: 12 mL/min — ABNORMAL LOW (ref >90)
Glucose, Bld: 110 mg/dL — ABNORMAL HIGH (ref 70–99)
Potassium: 3.8 mEq/L (ref 3.5–5.1)
Sodium: 138 mEq/L (ref 135–145)
Total Bilirubin: 0.3 mg/dL (ref 0.3–1.2)

## 2013-02-14 MED ORDER — LIDOCAINE HCL 1 % IJ SOLN
INTRAMUSCULAR | Status: AC
  Filled 2013-02-14: qty 20

## 2013-02-14 MED ORDER — FENTANYL CITRATE 0.05 MG/ML IJ SOLN
INTRAMUSCULAR | Status: AC
  Filled 2013-02-14: qty 6

## 2013-02-14 MED ORDER — METOPROLOL TARTRATE 100 MG PO TABS
100.0000 mg | ORAL_TABLET | Freq: Two times a day (BID) | ORAL | Status: DC
  Administered 2013-02-14 – 2013-02-16 (×6): 100 mg via ORAL
  Filled 2013-02-14 (×8): qty 1

## 2013-02-14 MED ORDER — MIDAZOLAM HCL 2 MG/2ML IJ SOLN
INTRAMUSCULAR | Status: AC
  Filled 2013-02-14: qty 6

## 2013-02-14 MED ORDER — METOPROLOL TARTRATE 100 MG PO TABS: 100.0000 mg | ORAL_TABLET | Freq: Two times a day (BID) | ORAL | Status: DC

## 2013-02-14 MED ORDER — VITAMINS A & D EX OINT
TOPICAL_OINTMENT | CUTANEOUS | Status: AC
  Administered 2013-02-14: 1
  Filled 2013-02-14: qty 5

## 2013-02-14 MED ORDER — IOHEXOL 300 MG/ML  SOLN
INTRAMUSCULAR | Status: AC | PRN
  Administered 2013-02-14: 1 mL

## 2013-02-14 NOTE — Progress Notes (Signed)
Progress Note   Subjective  Feels okay. No GI bleeding noted. Right flank pain better post nephrostomy    Objective   Vital signs in last 24 hours: Temp:  [97.6 F (36.4 C)-98.7 F (37.1 C)] 97.6 F (36.4 C) (10/22 0750) Pulse Rate:  [27-123] 108 (10/22 1006) Resp:  [12-28] 12 (10/22 1006) BP: (93-124)/(49-78) 102/61 mmHg (10/22 1006) SpO2:  [92 %-99 %] 93 % (10/22 1006) Last BM Date: 02/13/13 General:    White male in NAD Heart:  Irregular rate and rhythm Abdomen:  Soft, nontender and nondistended. Normal bowel sounds. Right nephrostomy drainage bag with turbid urine Neurologic:  Alert and oriented,  grossly normal neurologically. Psych:  Cooperative. Normal mood and affect.  Lab Results:  Recent Labs  02/12/13 1410  02/13/13 0515 02/13/13 1200 02/14/13 0344  WBC 13.0*  --  10.6*  --  8.7  HGB 11.7*  < > 10.7* 10.8* 10.6*  HCT 35.1*  < > 31.6* 32.1* 31.7*  PLT 209  --  199  --  193  < > = values in this interval not displayed. BMET  Recent Labs  02/12/13 1410 02/13/13 0515 02/14/13 0344  NA 134* 134* 138  K 5.2* 4.1 3.8  CL 100 99 103  CO2 16* 17* 21  GLUCOSE 108* 96 110*  BUN 106* 110* 94*  CREATININE 5.52* 5.24* 4.22*  CALCIUM 8.9 8.7 8.4   LFT  Recent Labs  02/14/13 0344  PROT 6.1  ALBUMIN 2.5*  AST 11  ALT 8  ALKPHOS 77  BILITOT 0.3   PT/INR  Recent Labs  02/12/13 1410 02/14/13 0813  LABPROT 15.7* 15.4*  INR 1.28 1.25    Studies/Results: Dg Chest 2 View  02/12/2013   CLINICAL DATA:  Stent placement. Rectal bleeding. Fatigue.  EXAM: CHEST  2 VIEW  COMPARISON:  10/24/2012  FINDINGS: Stable moderate cardiomegaly. Tortuous and atherosclerotic thoracic aorta. Prior median sternotomy.  Minimal bibasilar scarring. No pleural effusion noted. Thoracic spondylosis is present.  IMPRESSION: 1. Stable moderate cardiomegaly with tortuous and atherosclerotic thoracic aorta. 2. Stable scarring in the lung bases.   Electronically Signed   By:  Herbie Baltimore M.D.   On: 02/12/2013 15:28   US Renal  02/12/2013   CLINICAL DATA:  Elevated creatinine.  EXAM: RENAL/URINARY TRACT ULTRASOUND COMPLETE  COMPARISON:  CT scan from 10/18/2012  FINDINGS: Right Kidney  Length: 19.6 cm. There is a large cystic process in the right renal hilum consistent with marked hydronephrosis, as seen on the previous CT scan.  Left Kidney  Length: 16.0 cm. Moderate left hydronephrosis is evident, but appearance is substantially improved compared to the previous CT scan.  Bladder  Foley balloon is identified within the decompressed urinary bladder.  Abdominal aortic aneurysm measures 4.8 cm.  IMPRESSION: Marked right hydronephrosis, seen on the previous CT scan. There is moderate left hydronephrosis, improved substantially compared to the prior CT scan.  Abdominal aortic aneurysm.   Electronically Signed   By: Kennith Center M.D.   On: 02/12/2013 16:17     Assessment / Plan:   4. 79 year old male with passage of black stool last week. Stool heme positive n ED yesterday but no overt bleeding in two days. Though hemoglobin has been stable since admission (10.2 to 10.8), overall hgb down about 2 grams from baseline of mid 12 range. Continue BID PPI and close monitoring for now. Will likely proceed with EGD Friday if patient remains stable. Coumadin is still on  hold  2. Right hydronephrosis, urology following. Patient is s/p recent several urologic procedures for bilateral ureteral stone extraction, lithotripsy and stenting. For persistent severe right hydronephrosis patient underwent right perc nephrostomy this am.     3. Sepsis syndrome. Workup in progress.    4. Multiple medical problems. Patient's home coumadin has been on hold for several days for urologic procedures   LOS: 2 days   Willette Cluster  02/14/2013, 10:24 AM     Attending physician's note   I have taken an interval history, reviewed the chart and examined the patient. I agree with the Advanced  Practitioner's note, impression and recommendations. Plan for EGD on Friday to evaluate history of melena, heme + stool. Continue PPI BID. Monitor Hb/Hct.  Venita Lick. Russella Dar, MD [facility]

## 2013-02-14 NOTE — Progress Notes (Signed)
Patient transferred from [place]. Agree with previous RN assessment. Tele box 24 applied. Patient currently in a.fib, rate controlled.  Right nephrostomy tube draining to gravity along with foley.  Will continue to monitor patient.

## 2013-02-14 NOTE — Procedures (Signed)
Successful placement of right percutaneous nephrostomy.  No immediate complication.  Will send a urine sample for culture.

## 2013-02-14 NOTE — Progress Notes (Signed)
[facility] PROGRESS NOTE  [name] ZOX:[id number] DOB: 28-Dec-1925 DOA: 02/12/2013 PCP: [name], MD  Assessment/Plan:  Melena/GIB  -Remains off coumadin with unremarkable INR -Hb has remained stable around 10. -Appreciate GI recs: planning for EGD on Friday.  UTI  -Continue rocephin pending cx data.  Sepsis syndrome /Hypotension  -BP improved.  Atrial fibrillation, chronic  -continue dig- renal dose  -Will restart metoprolol with holding parameters due to increasing HR. -remain off coumadin due to GI bleed  Acute renal failure on Chronic kidney disease, stage III  -secondary to hydronephrosis-renal US as above, and #1also contributing  -S/p percutaneous nephrostomy tube today,  - urology to follow  -Cr  improved this AM.  Hyperkalemia/metab acidosis  -hydrating with ivf/bicarb as above, follow and recheck  -due to ARF, as discussed above follow and consult renal if refactory.  -Resolved. Bicarb is 21 today.  CM/CHF  -compensated, monitor fluid status closely and further treat as appropriate. - last documented EF of 50-55% on 10/09.  Hydronephrosis - s/p perc nephrostomy today.  Code Status: Full Family Communication: Patient only Disposition Plan: Would anticipate DC back home. Will request PT eval.   Consultants:  GI  Antibiotics:  Rocephin   HPI/Subjective: No acute events noted overnight  Objective: Filed Vitals:   02/14/13 1001 02/14/13 1006 02/14/13 1050 02/14/13 1200  BP: 93/61 102/61    Pulse: 116 108 130   Temp:    97.5 F (36.4 C)  TempSrc:    Oral  Resp: 19 12    Height:      Weight:      SpO2: 94% 93%      Intake/Output Summary (Last 24 hours) at 02/14/13 1252 Last data filed at 02/14/13 1150  Gross per 24 hour  Intake   5510 ml  Output   3175 ml  Net   2335 ml   Filed Weights   02/12/13 2000  Weight: 81 kg (178 lb 9.2 oz)    Exam:   General:  Awake, in nad  Cardiovascular: regular, s1, s2  Respiratory:  normal resp effort, no wheezing  Abdomen: soft, nondistended  Musculoskeletal: perfused, no clubbing   Data Reviewed: Basic Metabolic Panel:  Recent Labs Lab 02/12/13 1410 02/13/13 0515 02/14/13 0344  NA 134* 134* 138  K 5.2* 4.1 3.8  CL 100 99 103  CO2 16* 17* 21  GLUCOSE 108* 96 110*  BUN 106* 110* 94*  CREATININE 5.52* 5.24* 4.22*  CALCIUM 8.9 8.7 8.4   Liver Function Tests:  Recent Labs Lab 02/12/13 1410 02/14/13 0344  AST 10 11  ALT 8 8  ALKPHOS 126* 77  BILITOT 0.5 0.3  PROT 7.1 6.1  ALBUMIN 3.1* 2.5*   No results found for this basename: LIPASE, AMYLASE,  in the last 168 hours No results found for this basename: AMMONIA,  in the last 168 hours CBC:  Recent Labs Lab 02/12/13 1410 02/12/13 1848 02/12/13 2336 02/13/13 0515 02/13/13 1200 02/14/13 0344  WBC 13.0*  --   --  10.6*  --  8.7  NEUTROABS  --   --   --   --   --  7.3  HGB 11.7* 10.8* 10.2* 10.7* 10.8* 10.6*  HCT 35.1* 32.3* 30.4* 31.6* 32.1* 31.7*  MCV 93.1  --   --  92.1  --  91.9  PLT 209  --   --  199  --  193   Cardiac Enzymes: No results found for this basename: CKTOTAL, CKMB, CKMBINDEX, TROPONINI,  in the last 168 hours BNP (last 3 results) No results found for this basename: PROBNP,  in the last 8760 hours CBG: No results found for this basename: GLUCAP,  in the last 168 hours  Recent Results (from the past 240 hour(s))  URINE CULTURE     Status: None   Collection Time    02/12/13  3:13 PM      Result Value Range Status   Specimen Description URINE, RANDOM   Final   Special Requests NONE   Final   Culture  Setup Time     Final   Value: 02/13/2013 03:13     Performed at [facility] Count     Final   Value: NO GROWTH     Performed at [facility]   Culture     Final   Value: NO GROWTH     Performed at [facility]   Report Status 02/14/2013 FINAL   Final  MRSA PCR SCREENING     Status: None   Collection Time    02/12/13  5:24 PM       Result Value Range Status   MRSA by PCR NEGATIVE  NEGATIVE Final   Comment:            The GeneXpert MRSA Assay (FDA     approved for NASAL specimens     only), is one component of a     comprehensive MRSA colonization     surveillance program. It is not     intended to diagnose MRSA     infection nor to guide or     monitor treatment for     MRSA infections.  CULTURE, BLOOD (ROUTINE X 2)     Status: None   Collection Time    02/12/13  6:30 PM      Result Value Range Status   Specimen Description BLOOD LEFT HAND   Final   Special Requests BOTTLES DRAWN AEROBIC AND ANAEROBIC 10CC   Final   Culture  Setup Time     Final   Value: 02/12/2013 21:53     Performed at [facility]   Culture     Final   Value:        BLOOD CULTURE RECEIVED NO GROWTH TO DATE CULTURE WILL BE HELD FOR 5 DAYS BEFORE ISSUING A FINAL NEGATIVE REPORT     Performed at [facility]   Report Status PENDING   Incomplete  CULTURE, BLOOD (ROUTINE X 2)     Status: None   Collection Time    02/12/13  6:46 PM      Result Value Range Status   Specimen Description BLOOD LEFT ARM   Final   Special Requests BOTTLES DRAWN AEROBIC AND ANAEROBIC 10CC   Final   Culture  Setup Time     Final   Value: 02/12/2013 21:53     Performed at [facility]   Culture     Final   Value:        BLOOD CULTURE RECEIVED NO GROWTH TO DATE CULTURE WILL BE HELD FOR 5 DAYS BEFORE ISSUING A FINAL NEGATIVE REPORT     Performed at [facility]   Report Status PENDING   Incomplete     Studies: Dg Chest 2 View  02/12/2013   CLINICAL DATA:  Stent placement. Rectal bleeding. Fatigue.  EXAM: CHEST  2 VIEW  COMPARISON:  10/24/2012  FINDINGS: Stable moderate cardiomegaly. Tortuous and  atherosclerotic thoracic aorta. Prior median sternotomy.  Minimal bibasilar scarring. No pleural effusion noted. Thoracic spondylosis is present.  IMPRESSION: 1. Stable moderate cardiomegaly with tortuous and atherosclerotic thoracic aorta.  2. Stable scarring in the lung bases.   Electronically Signed   By: Herbie Baltimore M.D.   On: 02/12/2013 15:28   US Renal  02/12/2013   CLINICAL DATA:  Elevated creatinine.  EXAM: RENAL/URINARY TRACT ULTRASOUND COMPLETE  COMPARISON:  CT scan from 10/18/2012  FINDINGS: Right Kidney  Length: 19.6 cm. There is a large cystic process in the right renal hilum consistent with marked hydronephrosis, as seen on the previous CT scan.  Left Kidney  Length: 16.0 cm. Moderate left hydronephrosis is evident, but appearance is substantially improved compared to the previous CT scan.  Bladder  Foley balloon is identified within the decompressed urinary bladder.  Abdominal aortic aneurysm measures 4.8 cm.  IMPRESSION: Marked right hydronephrosis, seen on the previous CT scan. There is moderate left hydronephrosis, improved substantially compared to the prior CT scan.  Abdominal aortic aneurysm.   Electronically Signed   By: Kennith Center M.D.   On: 02/12/2013 16:17   Ir Perc Nephrostomy Right  02/14/2013   CLINICAL DATA:  Right flank pain and severe hydronephrosis. Poor renal function.  EXAM: PERC NEPHROSTOMY*R*; IR ULTRASOUND GUIDANCE TISSUE ABLATION  Physician: Rachelle Hora. Lowella Dandy, MD  MEDICATIONS: Versed 0.5 mg. Fentanyl 25 mcg. Rocephin 1 g. A radiology nurse monitored the patient for moderate sedation.  ANESTHESIA/SEDATION: Moderate sedation time: 20 min  FLUOROSCOPY TIME:  3 min and 18 seconds  PROCEDURE: The procedure was explained to the patient. The risks and benefits of the procedure were discussed and the patient's questions were addressed. Informed consent was obtained from the patient. The patient was placed prone on the interventional table. The right kidney was identified with ultrasound. The right flank was marked. The skin was prepped and draped in sterile fashion. Maximal barrier sterile technique was utilized including caps, mask, sterile gowns, sterile gloves, sterile drape, hand hygiene and skin antiseptic.  The skin was anesthetized with 1% lidocaine. Using ultrasound guidance, a 21 gauge needle was directed into a lower pole dilated calyx. Needle position was confirmed within the collecting system with ultrasound. Contrast was injected to confirm placement within the calyx. A 0.018 wire was placed and a dilator set was placed. A Bentson wire was advanced into the renal pelvis with a Kumpe catheter. The tract was dilated and a 10 [place] multipurpose drain was placed. 2 liters of yellow urine was removed. A sample was sent for Gram stain and culture. Catheter was sutured to the skin and attached to gravity bag. Fluoroscopic and ultrasound images were taken and saved for documentation.  COMPLICATIONS: None  FINDINGS: Massive right hydronephrosis. Percutaneous access was obtained from a lower pole calyx. Pigtail catheter in the renal pelvis. 2 liters of urine was removed.  IMPRESSION: Successful right percutaneous nephrostomy tube with ultrasound and fluoroscopic guidance.   Electronically Signed   By: Richarda Overlie M.D.   On: 02/14/2013 10:35   Ir US Guide Bx Asp/drain  02/14/2013   CLINICAL DATA:  Right flank pain and severe hydronephrosis. Poor renal function.  EXAM: PERC NEPHROSTOMY*R*; IR ULTRASOUND GUIDANCE TISSUE ABLATION  Physician: Rachelle Hora. Lowella Dandy, MD  MEDICATIONS: Versed 0.5 mg. Fentanyl 25 mcg. Rocephin 1 g. A radiology nurse monitored the patient for moderate sedation.  ANESTHESIA/SEDATION: Moderate sedation time: 20 min  FLUOROSCOPY TIME:  3 min and 18 seconds  PROCEDURE: The procedure  was explained to the patient. The risks and benefits of the procedure were discussed and the patient's questions were addressed. Informed consent was obtained from the patient. The patient was placed prone on the interventional table. The right kidney was identified with ultrasound. The right flank was marked. The skin was prepped and draped in sterile fashion. Maximal barrier sterile technique was utilized including caps, mask,  sterile gowns, sterile gloves, sterile drape, hand hygiene and skin antiseptic. The skin was anesthetized with 1% lidocaine. Using ultrasound guidance, a 21 gauge needle was directed into a lower pole dilated calyx. Needle position was confirmed within the collecting system with ultrasound. Contrast was injected to confirm placement within the calyx. A 0.018 wire was placed and a dilator set was placed. A Bentson wire was advanced into the renal pelvis with a Kumpe catheter. The tract was dilated and a 10 [place] multipurpose drain was placed. 2 liters of yellow urine was removed. A sample was sent for Gram stain and culture. Catheter was sutured to the skin and attached to gravity bag. Fluoroscopic and ultrasound images were taken and saved for documentation.  COMPLICATIONS: None  FINDINGS: Massive right hydronephrosis. Percutaneous access was obtained from a lower pole calyx. Pigtail catheter in the renal pelvis. 2 liters of urine was removed.  IMPRESSION: Successful right percutaneous nephrostomy tube with ultrasound and fluoroscopic guidance.   Electronically Signed   By: Richarda Overlie M.D.   On: 02/14/2013 10:35    Scheduled Meds: . cefTRIAXone (ROCEPHIN)  IV  1 g Intravenous Daily  . digoxin  0.125 mg Oral QODAY  . fentaNYL      . latanoprost  1 drop Both Eyes QHS  . lidocaine      . metoprolol  100 mg Oral BID  . midazolam      . pantoprazole  40 mg Oral BID  . sodium chloride  3 mL Intravenous Q12H  . vitamin B-12  2,000 mcg Oral Daily   Continuous Infusions: . sodium chloride 1,000 mL (02/13/13 0917)  .  sodium bicarbonate infusion 1/4 NS 1000 mL 100 mL/hr at 02/13/13 1608    Active Problems:   Atrial fibrillation, chronic   Chronic anticoagulation   Chronic kidney disease, stage III (moderate)   Acute renal failure   Hypotension   Melena   Anemia   Urinary tract infection, site not specified   Sepsis   Nonspecific abnormal finding in stool contents  Time spent:   [facility]  [facility] Pager [phone]. If 7PM-7AM, please contact night-coverage at [email or web address], password [facility] 02/14/2013, 12:52 PM  LOS: 2 days

## 2013-02-15 LAB — URINE CULTURE: Colony Count: NO GROWTH

## 2013-02-15 MED ORDER — ENSURE COMPLETE PO LIQD
237.0000 mL | ORAL | Status: DC
  Administered 2013-02-16: 237 mL via ORAL

## 2013-02-15 MED ORDER — ZOLPIDEM TARTRATE 5 MG PO TABS
5.0000 mg | ORAL_TABLET | Freq: Once | ORAL | Status: AC
  Administered 2013-02-15: 01:00:00 5 mg via ORAL
  Filled 2013-02-15: qty 1

## 2013-02-15 MED ORDER — LORAZEPAM 1 MG PO TABS
1.0000 mg | ORAL_TABLET | Freq: Once | ORAL | Status: AC
  Administered 2013-02-15: 1 mg via ORAL
  Filled 2013-02-15: qty 1

## 2013-02-15 NOTE — Evaluation (Signed)
I have reviewed this note and agree with all findings. Kati Romeka Scifres, PT, DPT Pager: [phone]   

## 2013-02-15 NOTE — Evaluation (Signed)
Physical Therapy Evaluation Patient Details Name: [name] MRN: [id number] DOB: 11-Nov-1925 Today's Date: 02/15/2013 Time: 4098-1191 PT Time Calculation (min): 23 min  PT Assessment / Plan / Recommendation History of Present Illness  Pt is an 79 y.o. male with multiple medical problems as listed below including chronic afib on coumadin- but has been off (the coumadin) for about 10days or so for his urologic procedures per Dr [name], CKD stage 3, bilat hydronephrosis, ureteral calculi, recently hospitalized (10/6) for Right ureteral stone extraction/Right ureteral stent removal/Right double-J stent placement and followed up last week for stent removal per urology and presents with the above complaints. He states that he was constipated following most recent procedures and was prescribes miralax, and reported  Melanotic stools x 3days. He denies n/v, and reports having had some R. Sided abd pain initially but that resolved. He came to see Dr [name] at office today and was asked to come the EDP. In ED hgb was 11.7(down from 13.1 on 10/6), and cr 5.52-from 2.48 on 10/6, k. 5.2 and CO2 16, UA c/w UTI  And BP dropped to 90s. Renal US was done and showed Marked right hydronephrosis, as on previous CT scan and moderate left hydronephrosis, improved substantially compared prior. Dr [name] was consulted per EDP and states he will follow pt. He has a foley in place. He is admitted for further eval and management. He admits to decreased PO intake and gen. weakness, denies N/V, and no Nsaid use reported.  Clinical Impression  Pt admitted with above. Pt currently presenting with functional limitations due to deficits listed below (see PT Problem List).  Pt able to ambulate 125' today with SPC and traverse stairs with min assist to min guard due to occasional unsteady gait.  PT recommend HHPT for pt to improve LLE strength, endurance, and balance during gait, pt seems agreeable.  PT unable to decipher if pt has  SPC at home, as pt would only state " I just starting using a cane in the hospital" when asked if he has assistive devices at home, will clarify next visit.  Pt would benefit from skilled PT to increase independence and safety during mobility to allow for discharge home.    PT Assessment  Patient needs continued PT services    Follow Up Recommendations  Home health PT;Supervision for mobility/OOB    Does the patient have the potential to tolerate intense rehabilitation      Barriers to Discharge Decreased caregiver support      Equipment Recommendations  Other (comment) (TBD: need to confirm if pt has SPC at home)    Recommendations for Other Services     Frequency Min 3X/week    Precautions / Restrictions Precautions Precautions: Fall Restrictions Weight Bearing Restrictions: No   Pertinent Vitals/Pain No c/o pain at rest, with pt reporting L knee pain during knee flexion that quickly dissipates upon resting LLE.  Pt positioned to comfort at end of session. No c/o SOB/dizziness during session.      Mobility  Bed Mobility Details for Bed Mobility Assistance: pt sitting in chair upon arrival. Transfers Transfers: Sit to Stand;Stand to Sit Sit to Stand: 4: Min guard;From chair/3-in-1;With upper extremity assist;With armrests Stand to Sit: 4: Min guard;With upper extremity assist;With armrests;To chair/3-in-1 Details for Transfer Assistance: min guard to ensure safety and due to pt reporting occassional  L knee pain. Ambulation/Gait Ambulation/Gait Assistance: 4: Min assist Ambulation Distance (Feet): 125 Feet Assistive device: Straight cane Ambulation/Gait Assistance Details: min assist during  first 75' of ambulation with SPC due to unsteady gait which improved and pt able to progress to min guard to ensure safety. VC's for gait sequence with SPC. pt utilized [facility] and pushed IV pole during last 59' of amb. Gait Pattern: Step-through pattern;Decreased stride length;Trunk  flexed;Wide base of support Gait velocity: decreased Stairs: Yes Stairs Assistance: 4: Min guard Stairs Assistance Details (indicate cue type and reason): pt able to ascend(going forward)/descend(going backwards) 5 steps with SPC and handrail with min guard to ensure safety. Pt would benefit from traversing steps again prior to d/c to ensure safety. Stair Management Technique: One rail Left;Other (comment) (and SPC) Number of Stairs: 5    Exercises     PT Diagnosis: Difficulty walking  PT Problem List: Decreased strength;Decreased balance;Decreased mobility;Decreased activity tolerance;Decreased knowledge of use of DME PT Treatment Interventions: DME instruction;Gait training;Stair training;Balance training;Neuromuscular re-education;Therapeutic exercise;Functional mobility training;Therapeutic activities;Patient/family education     PT Goals(Current goals can be found in the care plan section) Acute Rehab PT Goals Patient Stated Goal: to go home PT Goal Formulation: With patient Time For Goal Achievement: 03/01/13 Potential to Achieve Goals: Good  Visit Information  Last PT Received On: 02/15/13 Assistance Needed: +1 History of Present Illness: Pt is an 79 y.o. male with multiple medical problems as listed below including chronic afib on coumadin- but has been off (the coumadin) for about 10days or so for his urologic procedures per Dr [name], CKD stage 3, bilat hydronephrosis, ureteral calculi, recently hospitalized (10/6) for Right ureteral stone extraction/Right ureteral stent removal/Right double-J stent placement and followed up last week for stent removal per urology and presents with the above complaints. He states that he was constipated following most recent procedures and was prescribes miralax, and reported  Melanotic stools x 3days. He denies n/v, and reports having had some R. Sided abd pain initially but that resolved. He came to see Dr [name] at office today and was asked  to come the EDP. In ED hgb was 11.7(down from 13.1 on 10/6), and cr 5.52-from 2.48 on 10/6, k. 5.2 and CO2 16, UA c/w UTI  And BP dropped to 90s. Renal US was done and showed Marked right hydronephrosis, as on previous CT scan and moderate left hydronephrosis, improved substantially compared prior. Dr [name] was consulted per EDP and states he will follow pt. He has a foley in place. He is admitted for further eval and management. He admits to decreased PO intake and gen. weakness, denies N/V, and no Nsaid use reported.       Prior Functioning  Home Living Family/patient expects to be discharged to:: Private residence Living Arrangements: Alone Type of Home: House Home Access: Stairs to enter Secretary/administrator of Steps: 3 Entrance Stairs-Rails: Right Home Layout: One level Home Equipment: None Additional Comments: pt states he does not need any equipment but was not clear whether or not he had any equipment at home Prior Function Level of Independence: Independent Communication Communication: No difficulties    Cognition  Cognition Arousal/Alertness: Awake/alert Behavior During Therapy: WFL for tasks assessed/performed Overall Cognitive Status: Within Functional Limits for tasks assessed    Extremity/Trunk Assessment Lower Extremity Assessment Lower Extremity Assessment: LLE deficits/detail;RLE deficits/detail RLE Deficits / Details: pt report intermittent numbness/tingling due to peripheral neuropathy. Strength WFL. RLE Sensation: history of peripheral neuropathy LLE Deficits / Details: pt c/o L knee pain during knee flexion (unable to rate). MMT: hip flexion: 4/5, knee ext: 4/5, knee flex: 4/5 and knee pain, DF: 4/5. LLE Sensation:  history of peripheral neuropathy   Balance    End of Session PT - End of Session Equipment Utilized During Treatment: Gait belt Activity Tolerance: Patient tolerated treatment well Patient left: in chair;with chair alarm set;with call bell/phone  within reach  GP     Sol Blazing 02/15/2013, 3:44 PM

## 2013-02-15 NOTE — Progress Notes (Signed)
Patient ID: [name], male   DOB: [birth year], 79 y.o.   MRN: [id number]   Subjective: Patient is without complaint. He does not care for his nephrostomy tube but it is not causing him any discomfort.  Objective: Vital signs in last 24 hours: Temp:  [97.3 F (36.3 C)-98.6 F (37 C)] 97.3 F (36.3 C) (10/23 0529) Pulse Rate:  [74-114] 74 (10/23 0529) Resp:  [16] 16 (10/23 0529) BP: (101-117)/(58-79) 101/59 mmHg (10/23 0529) SpO2:  [95 %-97 %] 95 % (10/23 0529)  Intake/Output from previous day: 10/22 0701 - 10/23 0700 In: 3031.7 [P.O.:600; I.V.:2381.7; IV Piggyback:50] Out: 5625 [Urine:5625] Intake/Output this shift: Total I/O In: 50 [IV Piggyback:50] Out: 400 [Urine:400]  Past Medical History  Diagnosis Date  . Hyperlipidemia   . White coat hypertension   . Cardiomyopathy      EF:30-40% in 06/1998, but 50-55% in 2009;  HX ISCHEMIC CARDIOMYOPATHY  . Chronic atrial fibrillation   . Vitamin B12 deficiency   . Chronic anticoagulation   . Arthritis   . Sensory neuropathy   . Chronic kidney disease, stage III (moderate)     creatinine 1.5 2001, 2.0 2009, 2.28 in 12/2009  . Mild renal insufficiency   . Hydronephrosis, bilateral   . Ureteral calculi     BILATERAL  . History of CHF (congestive heart failure)     1999  &  2005  . History of kidney stones   . Bilateral renal cysts   . 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 Chronic venous insufficiency   . PAD (peripheral artery disease)   . Edema of both legs   . History of gout     per pt stable  . Glaucoma of both eyes   . Wears hearing aid     bilateral   Current Facility-Administered Medications  Medication Dose Route Frequency Provider Last Rate Last Dose  . 0.9 %  sodium chloride infusion   Intravenous Continuous Jerald Kief, MD 100 mL/hr at 02/15/13 1009    . acetaminophen (TYLENOL) tablet 650 mg   650 mg Oral Q6H PRN Kela Millin, MD       Or  . acetaminophen (TYLENOL) suppository 650 mg  650 mg Rectal Q6H PRN Adeline C Viyuoh, MD      . cefTRIAXone (ROCEPHIN) 1 g in dextrose 5 % 50 mL IVPB  1 g Intravenous Daily Adeline C Viyuoh, MD   1 g at 02/15/13 1004  . digoxin (LANOXIN) tablet 0.125 mg  0.125 mg Oral QODAY Adeline C Viyuoh, MD   0.125 mg at 02/14/13 1050  . feeding supplement (ENSURE COMPLETE) (ENSURE COMPLETE) liquid 237 mL  237 mL Oral Q24H Lorraine Lax, RD      . HYDROcodone-acetaminophen (NORCO) 10-325 MG per tablet 1-2 tablet  1-2 tablet Oral Q6H PRN Kela Millin, MD   2 tablet at 02/15/13 0102  . latanoprost (XALATAN) 0.005 % ophthalmic solution 1 drop  1 drop Both Eyes QHS Kela Millin, MD   1 drop at 02/14/13 2116  . metoprolol (LOPRESSOR) injection 2.5-5 mg  2.5-5 mg Intravenous Q6H PRN Kela Millin, MD   5 mg at 02/14/13 0540  . metoprolol (LOPRESSOR) tablet 100 mg  100 mg Oral BID Henderson Cloud, MD   100 mg at 02/15/13 1006  . ondansetron (  ZOFRAN) tablet 4 mg  4 mg Oral Q6H PRN Adeline C Viyuoh, MD       Or  . ondansetron (ZOFRAN) injection 4 mg  4 mg Intravenous Q6H PRN Adeline C Viyuoh, MD      . pantoprazole (PROTONIX) EC tablet 40 mg  40 mg Oral BID Meredith Pel, NP   40 mg at 02/15/13 1006  . sodium chloride 0.9 % injection 3 mL  3 mL Intravenous Q12H Adeline C Viyuoh, MD   3 mL at 02/14/13 1051  . vitamin B-12 (CYANOCOBALAMIN) tablet 2,000 mcg  2,000 mcg Oral Daily Kela Millin, MD   2,000 mcg at 02/14/13 1050    Physical Exam:  General: Patient is in no apparent distress Lungs: Normal respiratory effort, chest expands symmetrically. GI: The abdomen is soft and nontender without mass.    Lab Results:  Recent Labs  02/12/13 1410  02/13/13 0515 02/13/13 1200 02/14/13 0344  WBC 13.0*  --  10.6*  --  8.7  HGB 11.7*  < > 10.7* 10.8* 10.6*  HCT 35.1*  < > 31.6* 32.1* 31.7*  < > = values in this interval not  displayed. BMET  Recent Labs  02/13/13 0515 02/14/13 0344  NA 134* 138  K 4.1 3.8  CL 99 103  CO2 17* 21  GLUCOSE 96 110*  BUN 110* 94*  CREATININE 5.24* 4.22*  CALCIUM 8.7 8.4    Recent Labs  02/12/13 1410 02/14/13 0813  INR 1.28 1.25   No results found for this basename: LABURIN,  in the last 72 hours Results for orders placed during the hospital encounter of 02/12/13  URINE CULTURE     Status: None   Collection Time    02/12/13  3:13 PM      Result Value Range Status   Specimen Description URINE, RANDOM   Final   Special Requests NONE   Final   Culture  Setup Time     Final   Value: 02/13/2013 03:13     Performed at [facility] Count     Final   Value: NO GROWTH     Performed at [facility]   Culture     Final   Value: NO GROWTH     Performed at [facility]   Report Status 02/14/2013 FINAL   Final  MRSA PCR SCREENING     Status: None   Collection Time    02/12/13  5:24 PM      Result Value Range Status   MRSA by PCR NEGATIVE  NEGATIVE Final   Comment:            The GeneXpert MRSA Assay (FDA     approved for NASAL specimens     only), is one component of a     comprehensive MRSA colonization     surveillance program. It is not     intended to diagnose MRSA     infection nor to guide or     monitor treatment for     MRSA infections.  CULTURE, BLOOD (ROUTINE X 2)     Status: None   Collection Time    02/12/13  6:30 PM      Result Value Range Status   Specimen Description BLOOD LEFT HAND   Final   Special Requests BOTTLES DRAWN AEROBIC AND ANAEROBIC 10CC   Final   Culture  Setup Time     Final   Value: 02/12/2013 21:53  Performed at [facility]     Final   Value:        BLOOD CULTURE RECEIVED NO GROWTH TO DATE CULTURE WILL BE HELD FOR 5 DAYS BEFORE ISSUING A FINAL NEGATIVE REPORT     Performed at [facility]   Report Status PENDING   Incomplete  CULTURE, BLOOD (ROUTINE X 2)      Status: None   Collection Time    02/12/13  6:46 PM      Result Value Range Status   Specimen Description BLOOD LEFT ARM   Final   Special Requests BOTTLES DRAWN AEROBIC AND ANAEROBIC 10CC   Final   Culture  Setup Time     Final   Value: 02/12/2013 21:53     Performed at [facility]   Culture     Final   Value:        BLOOD CULTURE RECEIVED NO GROWTH TO DATE CULTURE WILL BE HELD FOR 5 DAYS BEFORE ISSUING A FINAL NEGATIVE REPORT     Performed at [facility]   Report Status PENDING   Incomplete  URINE CULTURE     Status: None   Collection Time    02/14/13 10:14 AM      Result Value Range Status   Specimen Description URINE, CATHETERIZED   Final   Special Requests rocephin   Final   Culture  Setup Time     Final   Value: 02/14/2013 13:02     Performed at [facility] Count     Final   Value: NO GROWTH     Performed at [facility]   Culture     Final   Value: NO GROWTH     Performed at [facility]   Report Status 02/15/2013 FINAL   Final    Studies/Results: @[id number] @  Assessment/Plan: His nephrostomy tube is draining and his urine is clear.  We discussed how to proceed and I note that his creatinine decreased slightly yesterday.  His creatinine for today remains pending. I told him that my recommendation would be for him to undergo an antegrade pyelogram in order to determine if there was obstruction where the stone was previously located.  I told him that it was obvious the stone had been impacted there for quite a long time as there appeared to be a lot of inflammatory changes and scarring of the ureter in that location.  He may have scarred down completely.  I need to know if it remains patent and if it is currently patent his nephrostomy tube could be clamped and if he has no increase in pain could then be removed.  If, on the other hand, his nephrostogram reveals obstruction then an attempt at passing a stent down the ureter  into the bladder would be ideal as he would then be able to have his nephrostomy tube removed.  In the long-term if he does have a stent in place he may require a stent to remain in place in order to keep his ureter open and would therefore have to undergo periodic stent changes.  He would like to see if he could get his nephrostomy tube out.  His initial urine culture showed no evidence of infection.  1.  I'm going to schedule him for a nephrostogram and antegrade stent placement. 2.  I will plan to see him back in my office for follow-up once discharged.  Garnett Farm 02/15/2013,  12:02 PM

## 2013-02-15 NOTE — Progress Notes (Signed)
Patient's watch and wallet at bedside.  Asked patient if we could put these items in a secured envelope and give to security. Patient's grandson, Madelin Rear, came in room at that time and stated he would take it home with patient's consent.  Wallet and watch were placed in secure envelope and sealed and given to patient's grandson to take home.

## 2013-02-15 NOTE — Progress Notes (Signed)
[facility] PROGRESS NOTE  ELCHANAN BOB OZH:[id number] DOB: 1925-10-12 DOA: 02/12/2013 PCP: [name], MD  Assessment/Plan:  Melena/GIB  -Remains off coumadin. -Hb has remained stable around 10. -No further evidence for GI bleed. -Appreciate GI recs: planning for EGD in am.  UTI  -Continue rocephin pending cx data.  Sepsis syndrome /Hypotension  -Resolved.  Atrial fibrillation, chronic  -continue dig- renal dose  -Will restart metoprolol with holding parameters due to increasing HR. -remain off coumadin due to GI bleed  Acute renal failure on Chronic kidney disease, stage III  -secondary to hydronephrosis-renal US as above, and #1also contributing  -S/p percutaneous nephrostomy tube today,  - urology to follow  -Cr  improved this AM. -Recheck renal function in am.  Hyperkalemia/metab acidosis  -due to ARF, as discussed above follow and consult renal if refactory.  -Resolved. Bicarb is 21 today. -Hyperkalemia resolved as well.  CM/CHF  -compensated, monitor fluid status closely and further treat as appropriate. - last documented EF of 50-55% on 10/09.  Hydronephrosis - s/p perc nephrostomy today.  Code Status: Full Family Communication: Patient only Disposition Plan: Would anticipate DC back home. Will request PT eval.   Consultants:  GI  Antibiotics:  Rocephin   HPI/Subjective: No acute events noted overnight  Objective: Filed Vitals:   02/14/13 1410 02/14/13 1523 02/14/13 2115 02/15/13 0529  BP: 116/79 104/58 117/64 101/59  Pulse: 114 101 86 74  Temp:  98.6 F (37 C) 98.1 F (36.7 C) 97.3 F (36.3 C)  TempSrc:  Oral Oral Oral  Resp:  16  16  Height:      Weight:      SpO2:  97% 95% 95%    Intake/Output Summary (Last 24 hours) at 02/15/13 1034 Last data filed at 02/15/13 1004  Gross per 24 hour  Intake 2731.67 ml  Output   3625 ml  Net -893.33 ml   Filed Weights   02/12/13 2000  Weight: 81 kg (178 lb 9.2 oz)     Exam:   General:  Awake, in nad  Cardiovascular: regular, s1, s2  Respiratory: normal resp effort, no wheezing  Abdomen: soft, nondistended  Musculoskeletal: perfused, no clubbing   Data Reviewed: Basic Metabolic Panel:  Recent Labs Lab 02/12/13 1410 02/13/13 0515 02/14/13 0344  NA 134* 134* 138  K 5.2* 4.1 3.8  CL 100 99 103  CO2 16* 17* 21  GLUCOSE 108* 96 110*  BUN 106* 110* 94*  CREATININE 5.52* 5.24* 4.22*  CALCIUM 8.9 8.7 8.4   Liver Function Tests:  Recent Labs Lab 02/12/13 1410 02/14/13 0344  AST 10 11  ALT 8 8  ALKPHOS 126* 77  BILITOT 0.5 0.3  PROT 7.1 6.1  ALBUMIN 3.1* 2.5*   No results found for this basename: LIPASE, AMYLASE,  in the last 168 hours No results found for this basename: AMMONIA,  in the last 168 hours CBC:  Recent Labs Lab 02/12/13 1410 02/12/13 1848 02/12/13 2336 02/13/13 0515 02/13/13 1200 02/14/13 0344  WBC 13.0*  --   --  10.6*  --  8.7  NEUTROABS  --   --   --   --   --  7.3  HGB 11.7* 10.8* 10.2* 10.7* 10.8* 10.6*  HCT 35.1* 32.3* 30.4* 31.6* 32.1* 31.7*  MCV 93.1  --   --  92.1  --  91.9  PLT 209  --   --  199  --  193   Cardiac Enzymes: No results found for this basename:  CKTOTAL, CKMB, CKMBINDEX, TROPONINI,  in the last 168 hours BNP (last 3 results) No results found for this basename: PROBNP,  in the last 8760 hours CBG: No results found for this basename: GLUCAP,  in the last 168 hours  Recent Results (from the past 240 hour(s))  URINE CULTURE     Status: None   Collection Time    02/12/13  3:13 PM      Result Value Range Status   Specimen Description URINE, RANDOM   Final   Special Requests NONE   Final   Culture  Setup Time     Final   Value: 02/13/2013 03:13     Performed at [facility] Count     Final   Value: NO GROWTH     Performed at [facility]   Culture     Final   Value: NO GROWTH     Performed at [facility]   Report Status 02/14/2013 FINAL    Final  MRSA PCR SCREENING     Status: None   Collection Time    02/12/13  5:24 PM      Result Value Range Status   MRSA by PCR NEGATIVE  NEGATIVE Final   Comment:            The GeneXpert MRSA Assay (FDA     approved for NASAL specimens     only), is one component of a     comprehensive MRSA colonization     surveillance program. It is not     intended to diagnose MRSA     infection nor to guide or     monitor treatment for     MRSA infections.  CULTURE, BLOOD (ROUTINE X 2)     Status: None   Collection Time    02/12/13  6:30 PM      Result Value Range Status   Specimen Description BLOOD LEFT HAND   Final   Special Requests BOTTLES DRAWN AEROBIC AND ANAEROBIC 10CC   Final   Culture  Setup Time     Final   Value: 02/12/2013 21:53     Performed at [facility]   Culture     Final   Value:        BLOOD CULTURE RECEIVED NO GROWTH TO DATE CULTURE WILL BE HELD FOR 5 DAYS BEFORE ISSUING A FINAL NEGATIVE REPORT     Performed at [facility]   Report Status PENDING   Incomplete  CULTURE, BLOOD (ROUTINE X 2)     Status: None   Collection Time    02/12/13  6:46 PM      Result Value Range Status   Specimen Description BLOOD LEFT ARM   Final   Special Requests BOTTLES DRAWN AEROBIC AND ANAEROBIC 10CC   Final   Culture  Setup Time     Final   Value: 02/12/2013 21:53     Performed at [facility]   Culture     Final   Value:        BLOOD CULTURE RECEIVED NO GROWTH TO DATE CULTURE WILL BE HELD FOR 5 DAYS BEFORE ISSUING A FINAL NEGATIVE REPORT     Performed at [facility]   Report Status PENDING   Incomplete     Studies: Ir Perc Nephrostomy Right  02/14/2013   CLINICAL DATA:  Right flank pain and severe hydronephrosis. Poor renal function.  EXAM: PERC NEPHROSTOMY*R*; IR ULTRASOUND GUIDANCE TISSUE  ABLATION  Physician: Rachelle Hora. Lowella Dandy, MD  MEDICATIONS: Versed 0.5 mg. Fentanyl 25 mcg. Rocephin 1 g. A radiology nurse monitored the patient for moderate sedation.   ANESTHESIA/SEDATION: Moderate sedation time: 20 min  FLUOROSCOPY TIME:  3 min and 18 seconds  PROCEDURE: The procedure was explained to the patient. The risks and benefits of the procedure were discussed and the patient's questions were addressed. Informed consent was obtained from the patient. The patient was placed prone on the interventional table. The right kidney was identified with ultrasound. The right flank was marked. The skin was prepped and draped in sterile fashion. Maximal barrier sterile technique was utilized including caps, mask, sterile gowns, sterile gloves, sterile drape, hand hygiene and skin antiseptic. The skin was anesthetized with 1% lidocaine. Using ultrasound guidance, a 21 gauge needle was directed into a lower pole dilated calyx. Needle position was confirmed within the collecting system with ultrasound. Contrast was injected to confirm placement within the calyx. A 0.018 wire was placed and a dilator set was placed. A Bentson wire was advanced into the renal pelvis with a Kumpe catheter. The tract was dilated and a 10 [place] multipurpose drain was placed. 2 liters of yellow urine was removed. A sample was sent for Gram stain and culture. Catheter was sutured to the skin and attached to gravity bag. Fluoroscopic and ultrasound images were taken and saved for documentation.  COMPLICATIONS: None  FINDINGS: Massive right hydronephrosis. Percutaneous access was obtained from a lower pole calyx. Pigtail catheter in the renal pelvis. 2 liters of urine was removed.  IMPRESSION: Successful right percutaneous nephrostomy tube with ultrasound and fluoroscopic guidance.   Electronically Signed   By: Richarda Overlie M.D.   On: 02/14/2013 10:35   Ir US Guide Bx Asp/drain  02/14/2013   CLINICAL DATA:  Right flank pain and severe hydronephrosis. Poor renal function.  EXAM: PERC NEPHROSTOMY*R*; IR ULTRASOUND GUIDANCE TISSUE ABLATION  Physician: Rachelle Hora. Lowella Dandy, MD  MEDICATIONS: Versed 0.5 mg. Fentanyl 25  mcg. Rocephin 1 g. A radiology nurse monitored the patient for moderate sedation.  ANESTHESIA/SEDATION: Moderate sedation time: 20 min  FLUOROSCOPY TIME:  3 min and 18 seconds  PROCEDURE: The procedure was explained to the patient. The risks and benefits of the procedure were discussed and the patient's questions were addressed. Informed consent was obtained from the patient. The patient was placed prone on the interventional table. The right kidney was identified with ultrasound. The right flank was marked. The skin was prepped and draped in sterile fashion. Maximal barrier sterile technique was utilized including caps, mask, sterile gowns, sterile gloves, sterile drape, hand hygiene and skin antiseptic. The skin was anesthetized with 1% lidocaine. Using ultrasound guidance, a 21 gauge needle was directed into a lower pole dilated calyx. Needle position was confirmed within the collecting system with ultrasound. Contrast was injected to confirm placement within the calyx. A 0.018 wire was placed and a dilator set was placed. A Bentson wire was advanced into the renal pelvis with a Kumpe catheter. The tract was dilated and a 10 [place] multipurpose drain was placed. 2 liters of yellow urine was removed. A sample was sent for Gram stain and culture. Catheter was sutured to the skin and attached to gravity bag. Fluoroscopic and ultrasound images were taken and saved for documentation.  COMPLICATIONS: None  FINDINGS: Massive right hydronephrosis. Percutaneous access was obtained from a lower pole calyx. Pigtail catheter in the renal pelvis. 2 liters of urine was removed.  IMPRESSION: Successful right percutaneous  nephrostomy tube with ultrasound and fluoroscopic guidance.   Electronically Signed   By: Richarda Overlie M.D.   On: 02/14/2013 10:35    Scheduled Meds: . cefTRIAXone (ROCEPHIN)  IV  1 g Intravenous Daily  . digoxin  0.125 mg Oral QODAY  . feeding supplement (ENSURE COMPLETE)  237 mL Oral Q24H  . latanoprost   1 drop Both Eyes QHS  . metoprolol  100 mg Oral BID  . pantoprazole  40 mg Oral BID  . sodium chloride  3 mL Intravenous Q12H  . vitamin B-12  2,000 mcg Oral Daily   Continuous Infusions: . sodium chloride 100 mL/hr at 02/15/13 1009    Active Problems:   Atrial fibrillation, chronic   Chronic anticoagulation   Chronic kidney disease, stage III (moderate)   Acute renal failure   Hypotension   Melena   Anemia   Urinary tract infection, site not specified   Sepsis   Nonspecific abnormal finding in stool contents  Time spent:  [facility]  [facility] Pager [phone]. If 7PM-7AM, please contact night-coverage at [email or web address], password [facility] 02/15/2013, 10:34 AM  LOS: 3 days

## 2013-02-15 NOTE — Progress Notes (Signed)
[facility] Progress Note   Subjective  feels okay, right flank pain better. No further GI bleeding   Objective   Vital signs in last 24 hours: Temp:  [97.3 F (36.3 C)-98.6 F (37 C)] 97.3 F (36.3 C) (10/23 0529) Pulse Rate:  [74-130] 74 (10/23 0529) Resp:  [12-19] 16 (10/23 0529) BP: (93-117)/(56-79) 101/59 mmHg (10/23 0529) SpO2:  [93 %-98 %] 95 % (10/23 0529) Last BM Date: 02/13/13 General:    white male in NAD Lungs: Respirations even and unlabored, lungs CTA bilaterally Abdomen:  Soft, nontender and nondistended. Normal bowel sounds. Neurologic:  Alert and oriented,  grossly normal neurologically. Psych:  Cooperative. Normal mood and affect.  Lab Results:  Recent Labs  02/12/13 1410  02/13/13 0515 02/13/13 1200 02/14/13 0344  WBC 13.0*  --  10.6*  --  8.7  HGB 11.7*  < > 10.7* 10.8* 10.6*  HCT 35.1*  < > 31.6* 32.1* 31.7*  PLT 209  --  199  --  193  < > = values in this interval not displayed. BMET  Recent Labs  02/12/13 1410 02/13/13 0515 02/14/13 0344  NA 134* 134* 138  K 5.2* 4.1 3.8  CL 100 99 103  CO2 16* 17* 21  GLUCOSE 108* 96 110*  BUN 106* 110* 94*  CREATININE 5.52* 5.24* 4.22*  CALCIUM 8.9 8.7 8.4   PT/INR  Recent Labs  02/12/13 1410 02/14/13 0813  LABPROT 15.7* 15.4*  INR 1.28 1.25      Assessment / Plan:   19. 79 year old male with passage of black stool last week. Stool heme positive n ED but no overt bleeding in 3+ days now. Hemoglobin stable in mid 10 range. Continue BID PPI. Plan is for EGD tomorrow, patient agrees.  Coumadin is still on hold   2. Right hydronephrosis, s/p right perc nephrostomy. Renal function improving  3. Sepsis syndrome. Improving.   4. Multiple medical problems. Patient's home coumadin has been on hold for several days for urologic procedures     LOS: 3 days   Willette Cluster  02/15/2013, 9:26 AM    Attending physician's note   I have taken a history, examined the patient and  reviewed the chart. I agree with the Advanced Practitioner's note, impression and recommendations. No recurrent bleeding. EGD for tomorrow at 0830.  Meryl Dare, MD [facility]

## 2013-02-15 NOTE — Progress Notes (Signed)
Subjective: Pt feeling some better. Sitting up in chair. S/p Rt PCN placement yesterday   Objective: Physical Exam: BP 106/59  Pulse 100  Temp(Src) 98.7 F (37.1 C) (Oral)  Resp 18  Ht 5\' 10"  (1.778 m)  Wt 178 lb 9.2 oz (81 kg)  BMI 25.62 kg/m2  SpO2 96% Rt PCN intact, site clean, NT Good clear UOP, >4000 recorded since PCN placement.    Labs: CBC  Recent Labs  02/13/13 0515 02/13/13 1200 02/14/13 0344  WBC 10.6*  --  8.7  HGB 10.7* 10.8* 10.6*  HCT 31.6* 32.1* 31.7*  PLT 199  --  193   BMET  Recent Labs  02/13/13 0515 02/14/13 0344  NA 134* 138  K 4.1 3.8  CL 99 103  CO2 17* 21  GLUCOSE 96 110*  BUN 110* 94*  CREATININE 5.24* 4.22*  CALCIUM 8.7 8.4   LFT  Recent Labs  02/14/13 0344  PROT 6.1  ALBUMIN 2.5*  AST 11  ALT 8  ALKPHOS 77  BILITOT 0.3   PT/INR  Recent Labs  02/14/13 0813  LABPROT 15.4*  INR 1.25     Studies/Results: Ir Perc Nephrostomy Right  02/14/2013   CLINICAL DATA:  Right flank pain and severe hydronephrosis. Poor renal function.  EXAM: PERC NEPHROSTOMY*R*; IR ULTRASOUND GUIDANCE TISSUE ABLATION  Physician: Rachelle Hora. Lowella Dandy, MD  MEDICATIONS: Versed 0.5 mg. Fentanyl 25 mcg. Rocephin 1 g. A radiology nurse monitored the patient for moderate sedation.  ANESTHESIA/SEDATION: Moderate sedation time: 20 min  FLUOROSCOPY TIME:  3 min and 18 seconds  PROCEDURE: The procedure was explained to the patient. The risks and benefits of the procedure were discussed and the patient's questions were addressed. Informed consent was obtained from the patient. The patient was placed prone on the interventional table. The right kidney was identified with ultrasound. The right flank was marked. The skin was prepped and draped in sterile fashion. Maximal barrier sterile technique was utilized including caps, mask, sterile gowns, sterile gloves, sterile drape, hand hygiene and skin antiseptic. The skin was anesthetized with 1% lidocaine. Using ultrasound  guidance, a 21 gauge needle was directed into a lower pole dilated calyx. Needle position was confirmed within the collecting system with ultrasound. Contrast was injected to confirm placement within the calyx. A 0.018 wire was placed and a dilator set was placed. A Bentson wire was advanced into the renal pelvis with a Kumpe catheter. The tract was dilated and a 10 [place] multipurpose drain was placed. 2 liters of yellow urine was removed. A sample was sent for Gram stain and culture. Catheter was sutured to the skin and attached to gravity bag. Fluoroscopic and ultrasound images were taken and saved for documentation.  COMPLICATIONS: None  FINDINGS: Massive right hydronephrosis. Percutaneous access was obtained from a lower pole calyx. Pigtail catheter in the renal pelvis. 2 liters of urine was removed.  IMPRESSION: Successful right percutaneous nephrostomy tube with ultrasound and fluoroscopic guidance.   Electronically Signed   By: Richarda Overlie M.D.   On: 02/14/2013 10:35   Ir US Guide Bx Asp/drain  02/14/2013   CLINICAL DATA:  Right flank pain and severe hydronephrosis. Poor renal function.  EXAM: PERC NEPHROSTOMY*R*; IR ULTRASOUND GUIDANCE TISSUE ABLATION  Physician: Rachelle Hora. Lowella Dandy, MD  MEDICATIONS: Versed 0.5 mg. Fentanyl 25 mcg. Rocephin 1 g. A radiology nurse monitored the patient for moderate sedation.  ANESTHESIA/SEDATION: Moderate sedation time: 20 min  FLUOROSCOPY TIME:  3 min and 18 seconds  PROCEDURE: The procedure  was explained to the patient. The risks and benefits of the procedure were discussed and the patient's questions were addressed. Informed consent was obtained from the patient. The patient was placed prone on the interventional table. The right kidney was identified with ultrasound. The right flank was marked. The skin was prepped and draped in sterile fashion. Maximal barrier sterile technique was utilized including caps, mask, sterile gowns, sterile gloves, sterile drape, hand hygiene and  skin antiseptic. The skin was anesthetized with 1% lidocaine. Using ultrasound guidance, a 21 gauge needle was directed into a lower pole dilated calyx. Needle position was confirmed within the collecting system with ultrasound. Contrast was injected to confirm placement within the calyx. A 0.018 wire was placed and a dilator set was placed. A Bentson wire was advanced into the renal pelvis with a Kumpe catheter. The tract was dilated and a 10 [place] multipurpose drain was placed. 2 liters of yellow urine was removed. A sample was sent for Gram stain and culture. Catheter was sutured to the skin and attached to gravity bag. Fluoroscopic and ultrasound images were taken and saved for documentation.  COMPLICATIONS: None  FINDINGS: Massive right hydronephrosis. Percutaneous access was obtained from a lower pole calyx. Pigtail catheter in the renal pelvis. 2 liters of urine was removed.  IMPRESSION: Successful right percutaneous nephrostomy tube with ultrasound and fluoroscopic guidance.   Electronically Signed   By: Richarda Overlie M.D.   On: 02/14/2013 10:35    Assessment/Plan: Recurrent right hydronephrosis, possible chronic UPJ stenosis from previous stones. Acute on chronic renal failure. S/p Rt PCN placement. WBC back to normal. Cr is down but only to 4.2, baseline looks to be in the low 2's Urology note/recs reviewed. Have d/w Dr. Grace Isaac, will check labs in am but have tentatively placed pt on schedule for  (R)nephrostogram with possible PCN removal vs. JJ stent placement. Discussed this procedure with pt. If Cr does not cont to improve or schedule cannot accommodate tomorrow, would leave PCN to gravity drain and look to next for procedure, either while pt still inpt, or as an outpt if he is discharged.    LOS: 3 days    Brayton El PA-C 02/15/2013 2:25 PM

## 2013-02-16 ENCOUNTER — Encounter (HOSPITAL_COMMUNITY): Payer: Self-pay | Admitting: *Deleted

## 2013-02-16 ENCOUNTER — Encounter (HOSPITAL_COMMUNITY): Payer: Self-pay | Admitting: Anesthesiology

## 2013-02-16 ENCOUNTER — Encounter (HOSPITAL_COMMUNITY)
Admission: EM | Disposition: A | Discharge status: Home / Self Care | Payer: Self-pay | Source: Home / Self Care | Attending: Internal Medicine

## 2013-02-16 ENCOUNTER — Inpatient Hospital Stay (HOSPITAL_COMMUNITY): Payer: Medicare Other

## 2013-02-16 DIAGNOSIS — B3781 Candidal esophagitis: Secondary | ICD-10-CM

## 2013-02-16 HISTORY — PX: ESOPHAGOGASTRODUODENOSCOPY: SHX5428

## 2013-02-16 LAB — BASIC METABOLIC PANEL
BUN: 51 mg/dL — ABNORMAL HIGH (ref 6–23)
Calcium: 8 mg/dL — ABNORMAL LOW (ref 8.4–10.5)
GFR calc Af Amer: 26 mL/min — ABNORMAL LOW (ref >90)
GFR calc non Af Amer: 23 mL/min — ABNORMAL LOW (ref >90)
Glucose, Bld: 102 mg/dL — ABNORMAL HIGH (ref 70–99)

## 2013-02-16 LAB — PROCALCITONIN: Procalcitonin: 0.1 ng/mL

## 2013-02-16 LAB — CBC
MCV: 94.6 fL (ref 78.0–100.0)
RDW: 14.1 % (ref 11.5–15.5)
WBC: 10.1 10*3/uL (ref 4.0–10.5)

## 2013-02-16 SURGERY — EGD (ESOPHAGOGASTRODUODENOSCOPY)
Anesthesia: Moderate Sedation

## 2013-02-16 MED ORDER — MIDAZOLAM HCL 10 MG/2ML IJ SOLN
INTRAMUSCULAR | Status: DC | PRN
  Administered 2013-02-16 (×2): 1 mg via INTRAVENOUS

## 2013-02-16 MED ORDER — FLUCONAZOLE 100 MG PO TABS
100.0000 mg | ORAL_TABLET | Freq: Every day | ORAL | Status: DC
  Administered 2013-02-16: 100 mg via ORAL
  Filled 2013-02-16 (×3): qty 1

## 2013-02-16 MED ORDER — FENTANYL CITRATE 0.05 MG/ML IJ SOLN
INTRAMUSCULAR | Status: AC
  Filled 2013-02-16: qty 2

## 2013-02-16 MED ORDER — MIDAZOLAM HCL 10 MG/2ML IJ SOLN
INTRAMUSCULAR | Status: AC
  Filled 2013-02-16: qty 2

## 2013-02-16 MED ORDER — FENTANYL CITRATE 0.05 MG/ML IJ SOLN
INTRAMUSCULAR | Status: DC | PRN
  Administered 2013-02-16 (×2): 12.5 ug via INTRAVENOUS

## 2013-02-16 MED ORDER — BUTAMBEN-TETRACAINE-BENZOCAINE 2-2-14 % EX AERO
INHALATION_SPRAY | CUTANEOUS | Status: DC | PRN
  Administered 2013-02-16: 2 via TOPICAL

## 2013-02-16 MED ORDER — FENTANYL CITRATE 0.05 MG/ML IJ SOLN
INTRAMUSCULAR | Status: AC
  Administered 2013-02-16: 17:00:00
  Filled 2013-02-16: qty 2

## 2013-02-16 MED ORDER — DIATRIZOATE MEGLUMINE 30 % UR SOLN
Freq: Once | URETHRAL | Status: AC | PRN
  Administered 2013-02-16: 75 mL

## 2013-02-16 NOTE — Op Note (Addendum)
[facility] [address] [place], [address]   ENDOSCOPY PROCEDURE REPORT  PATIENT: [name], [name]  MR#: [id number] BIRTHDATE: 08-Nov-1925 , 87  yrs. old GENDER: Male ENDOSCOPIST: Meryl Dare, MD, [facility] REFERRED BY:  [facility] PROCEDURE DATE:  02/16/2013 PROCEDURE:  EGD w/ biopsy ASA CLASS:     Class III INDICATIONS:  Anemia.   Heme positive stool.  h/o melena MEDICATIONS:  medications were titrated to patient response per physician's verbal order, Fentanyl 25 mcg IV, and Versed 2 mg IV TOPICAL ANESTHETIC: Cetacaine Spray DESCRIPTION OF PROCEDURE: After the risks benefits and alternatives of the procedure were thoroughly explained, informed consent was obtained.  The Pentax Gastroscope [id number] endoscope was introduced through the mouth and advanced to the second portion of the duodenum  without limitations.  The instrument was slowly withdrawn as the mucosa was fully examined.  ESOPHAGUS: White exudates consistent with candidiasis were found in the lower third of the esophagus.   The esophagus was otherwise normal. STOMACH: Mild atrophic gastritis  was found in the gastric body and gastric fundus.  Multiple biopsies were performed.   The stomach otherwise appeared normal. DUODENUM: The duodenal mucosa showed no abnormalities in the bulb and second portion of the duodenum.  Retroflexed views revealed no abnormalities.  The scope was then withdrawn from the patient and the procedure completed.  COMPLICATIONS: There were no complications.  ENDOSCOPIC IMPRESSION: 1.   White exudates consistent with candidiasis 2.   Atrophic gastritis in the gastric body and gastric fundus; multiple biopsies  RECOMMENDATIONS: 1.  Await pathology results 2.  Diflucan 100 mg daily for 5 days 3.  My office will schedule a colonoscopy as an outpatient after your hospitalization to further evaluate   eSigned:  Meryl Dare, MD, [facility] 02/16/2013 9:08 AM Revised:  02/16/2013 9:08 AM  CC: Lilyan Punt, MD

## 2013-02-16 NOTE — Progress Notes (Signed)
Patient back from IR, alert and oriented, r nephrostomy draining with pinkish urine.

## 2013-02-16 NOTE — Progress Notes (Signed)
PT Cancellation Note  Patient Details Name: [name] MRN: [id number] DOB: 01-28-1926   Cancelled Treatment:    Reason Eval/Treat Not Completed: Other (comment) (pt refused). Pt did not wish to participate in PT today due to fatigue as pt reports he's been up all night and just got back into bed. PT confirmed pt has single point cane at home for ambulation.   Sol Blazing 02/16/2013, 12:01 PM

## 2013-02-16 NOTE — Progress Notes (Signed)
I have reviewed this note and agree with all findings. Kati Petrona Wyeth, PT, DPT Pager: [phone]   

## 2013-02-16 NOTE — Progress Notes (Signed)
Day of Surgery Subjective: He reports no new complaints today.  Objective: Vital signs in last 24 hours: Temp:  [98.1 F (36.7 C)-98.7 F (37.1 C)] 98.1 F (36.7 C) (10/24 0910) Pulse Rate:  [82-103] 83 (10/24 0900) Resp:  [17-38] 23 (10/24 0920) BP: (106-137)/(59-90) 129/79 mmHg (10/24 0920) SpO2:  [91 %-98 %] 95 % (10/24 0920)  Intake/Output from previous day: 10/23 0701 - 10/24 0700 In: 3190 [P.O.:840; I.V.:2300; IV Piggyback:50] Out: 2900 [Urine:2900] Intake/Output this shift:    Physical Exam:  His nephrostomy tube continues to drain clear urine.  Lab Results:  Recent Labs  02/13/13 1200 02/14/13 0344 02/16/13 0425  HGB 10.8* 10.6* 9.8*  HCT 32.1* 31.7* 29.8*   BMET  Recent Labs  02/14/13 0344 02/16/13 0425  NA 138 137  K 3.8 3.7  CL 103 106  CO2 21 23  GLUCOSE 110* 102*  BUN 94* 51*  CREATININE 4.22* 2.44*  CALCIUM 8.4 8.0*    Recent Labs  02/14/13 0813  INR 1.25   No results found for this basename: LABURIN,  in the last 72 hours Results for orders placed during the hospital encounter of 02/12/13  URINE CULTURE     Status: None   Collection Time    02/12/13  3:13 PM      Result Value Range Status   Specimen Description URINE, RANDOM   Final   Special Requests NONE   Final   Culture  Setup Time     Final   Value: 02/13/2013 03:13     Performed at [facility] Count     Final   Value: NO GROWTH     Performed at [facility]   Culture     Final   Value: NO GROWTH     Performed at [facility]   Report Status 02/14/2013 FINAL   Final  MRSA PCR SCREENING     Status: None   Collection Time    02/12/13  5:24 PM      Result Value Range Status   MRSA by PCR NEGATIVE  NEGATIVE Final   Comment:            The GeneXpert MRSA Assay (FDA     approved for NASAL specimens     only), is one component of a     comprehensive MRSA colonization     surveillance program. It is not     intended to diagnose MRSA      infection nor to guide or     monitor treatment for     MRSA infections.  CULTURE, BLOOD (ROUTINE X 2)     Status: None   Collection Time    02/12/13  6:30 PM      Result Value Range Status   Specimen Description BLOOD LEFT HAND   Final   Special Requests BOTTLES DRAWN AEROBIC AND ANAEROBIC 10CC   Final   Culture  Setup Time     Final   Value: 02/12/2013 21:53     Performed at [facility]   Culture     Final   Value:        BLOOD CULTURE RECEIVED NO GROWTH TO DATE CULTURE WILL BE HELD FOR 5 DAYS BEFORE ISSUING A FINAL NEGATIVE REPORT     Performed at [facility]   Report Status PENDING   Incomplete  CULTURE, BLOOD (ROUTINE X 2)     Status: None   Collection Time  02/12/13  6:46 PM      Result Value Range Status   Specimen Description BLOOD LEFT ARM   Final   Special Requests BOTTLES DRAWN AEROBIC AND ANAEROBIC 10CC   Final   Culture  Setup Time     Final   Value: 02/12/2013 21:53     Performed at [facility]   Culture     Final   Value:        BLOOD CULTURE RECEIVED NO GROWTH TO DATE CULTURE WILL BE HELD FOR 5 DAYS BEFORE ISSUING A FINAL NEGATIVE REPORT     Performed at [facility]   Report Status PENDING   Incomplete  URINE CULTURE     Status: None   Collection Time    02/14/13 10:14 AM      Result Value Range Status   Specimen Description URINE, CATHETERIZED   Final   Special Requests rocephin   Final   Culture  Setup Time     Final   Value: 02/14/2013 13:02     Performed at [facility] Count     Final   Value: NO GROWTH     Performed at [facility]   Culture     Final   Value: NO GROWTH     Performed at [facility]   Report Status 02/15/2013 FINAL   Final  URINE CULTURE     Status: None   Collection Time    02/14/13  3:30 PM      Result Value Range Status   Specimen Description URINE, CATHETERIZED RIGHT NEPHROSTOMY TUBE   Final   Special Requests NONE   Final   Culture  Setup Time     Final    Value: 02/14/2013 22:20     Performed at [facility] Count     Final   Value: NO GROWTH     Performed at [facility]   Culture     Final   Value: NO GROWTH     Performed at [facility]   Report Status 02/15/2013 FINAL   Final    Studies/Results: Ir Perc Nephrostomy Right  02/14/2013   CLINICAL DATA:  Right flank pain and severe hydronephrosis. Poor renal function.  EXAM: PERC NEPHROSTOMY*R*; IR ULTRASOUND GUIDANCE TISSUE ABLATION  Physician: Rachelle Hora. Lowella Dandy, MD  MEDICATIONS: Versed 0.5 mg. Fentanyl 25 mcg. Rocephin 1 g. A radiology nurse monitored the patient for moderate sedation.  ANESTHESIA/SEDATION: Moderate sedation time: 20 min  FLUOROSCOPY TIME:  3 min and 18 seconds  PROCEDURE: The procedure was explained to the patient. The risks and benefits of the procedure were discussed and the patient's questions were addressed. Informed consent was obtained from the patient. The patient was placed prone on the interventional table. The right kidney was identified with ultrasound. The right flank was marked. The skin was prepped and draped in sterile fashion. Maximal barrier sterile technique was utilized including caps, mask, sterile gowns, sterile gloves, sterile drape, hand hygiene and skin antiseptic. The skin was anesthetized with 1% lidocaine. Using ultrasound guidance, a 21 gauge needle was directed into a lower pole dilated calyx. Needle position was confirmed within the collecting system with ultrasound. Contrast was injected to confirm placement within the calyx. A 0.018 wire was placed and a dilator set was placed. A Bentson wire was advanced into the renal pelvis with a Kumpe catheter. The tract was dilated and a 10 [place] multipurpose drain  was placed. 2 liters of yellow urine was removed. A sample was sent for Gram stain and culture. Catheter was sutured to the skin and attached to gravity bag. Fluoroscopic and ultrasound images were taken and saved for  documentation.  COMPLICATIONS: None  FINDINGS: Massive right hydronephrosis. Percutaneous access was obtained from a lower pole calyx. Pigtail catheter in the renal pelvis. 2 liters of urine was removed.  IMPRESSION: Successful right percutaneous nephrostomy tube with ultrasound and fluoroscopic guidance.   Electronically Signed   By: Richarda Overlie M.D.   On: 02/14/2013 10:35   Ir US Guide Bx Asp/drain  02/14/2013   CLINICAL DATA:  Right flank pain and severe hydronephrosis. Poor renal function.  EXAM: PERC NEPHROSTOMY*R*; IR ULTRASOUND GUIDANCE TISSUE ABLATION  Physician: Rachelle Hora. Lowella Dandy, MD  MEDICATIONS: Versed 0.5 mg. Fentanyl 25 mcg. Rocephin 1 g. A radiology nurse monitored the patient for moderate sedation.  ANESTHESIA/SEDATION: Moderate sedation time: 20 min  FLUOROSCOPY TIME:  3 min and 18 seconds  PROCEDURE: The procedure was explained to the patient. The risks and benefits of the procedure were discussed and the patient's questions were addressed. Informed consent was obtained from the patient. The patient was placed prone on the interventional table. The right kidney was identified with ultrasound. The right flank was marked. The skin was prepped and draped in sterile fashion. Maximal barrier sterile technique was utilized including caps, mask, sterile gowns, sterile gloves, sterile drape, hand hygiene and skin antiseptic. The skin was anesthetized with 1% lidocaine. Using ultrasound guidance, a 21 gauge needle was directed into a lower pole dilated calyx. Needle position was confirmed within the collecting system with ultrasound. Contrast was injected to confirm placement within the calyx. A 0.018 wire was placed and a dilator set was placed. A Bentson wire was advanced into the renal pelvis with a Kumpe catheter. The tract was dilated and a 10 [place] multipurpose drain was placed. 2 liters of yellow urine was removed. A sample was sent for Gram stain and culture. Catheter was sutured to the skin and  attached to gravity bag. Fluoroscopic and ultrasound images were taken and saved for documentation.  COMPLICATIONS: None  FINDINGS: Massive right hydronephrosis. Percutaneous access was obtained from a lower pole calyx. Pigtail catheter in the renal pelvis. 2 liters of urine was removed.  IMPRESSION: Successful right percutaneous nephrostomy tube with ultrasound and fluoroscopic guidance.   Electronically Signed   By: Richarda Overlie M.D.   On: 02/14/2013 10:35    Assessment/Plan:  His culture on admission was negative and the urine obtained at the time of his nephrostomy tube placement into the right renal pelvis was also cultured and found to be negative.  I spoke with the interventional radiologist, Dr. Grace Isaac, and we discussed His case in detail. What we have decided to do is place a stent since even if contrast were to pass through the system, because it is so patulous, he may obstruct when he stands. I agree completely with the placement of a stent and then also leaving a nephrostomy tube in place but capped off in order to maintain access and allow for stent removal and or stent change without the need for a general anesthetic. I think this is the best thing for the patient and I've discussed it with him today. His creatinine has improved significantly with drainage of his right kidney and he appears to be down to his baseline as far as creatinine goes.  From a urologic standpoint, with negative urine cultures,  as long as he gets a stent placed today and does well overnight he could potentially be discharged home in the morning with followup in my office as an outpatient.   LOS: 4 days   Benjamine Strout C 02/16/2013, 9:52 AM

## 2013-02-16 NOTE — Interval H&P Note (Signed)
History and Physical Interval Note:  02/16/2013 8:38 AM  [name]  has presented today for surgery, with the diagnosis of evaluation of black stool, anemia  The various methods of treatment have been discussed with the patient and family. After consideration of risks, benefits and other options for treatment, the patient has consented to  Procedure(s): ESOPHAGOGASTRODUODENOSCOPY (EGD) (N/A) as a surgical intervention .  The patient's history has been reviewed, patient examined, no change in status, stable for surgery.  I have reviewed the patient's chart and labs.  Questions were answered to the patient's satisfaction.     Venita Lick. Russella Dar MD Clementeen Graham

## 2013-02-16 NOTE — Procedures (Signed)
Antegrade nephrostogram performed via the existing right sided PCN demonstrates improved though persistent SEVERE hydronephrosis without identification of nearly any recognizable renal collecting system. Despite prolonged efforts (including contrast location from the superior aspect of the right ureter), no contrast was able to pass beyond the superior aspect of the right ureter. Successful exchange of right sided 10 Fr PCN which was subsequently reconnected to a gravity bag.  PLAN: Would recommend several weeks of urinary decompression via the PCN prior to repeat attempted antegrade nephrostogram and attempted placement of a right sided NUS.

## 2013-02-16 NOTE — ED Provider Notes (Signed)
Medical screening examination/treatment/procedure(s) were conducted as a shared visit with non-physician practitioner(s) and myself.  I personally evaluated the patient during the encounter  Please see my separate respective documentation pertaining to this patient encounter   Vida Roller, MD 02/16/13 [phone]

## 2013-02-16 NOTE — H&P (View-Only) (Signed)
Consultation  Referring Provider:  [facility]    Primary Care Physician:  Edward Asa, MD Primary Gastroenterologist: none     Reason for Consultation: GI bleed             HPI:   [name] is a 79 y.o. male with multiple medical problems. He takes coumadin for atrial fibrillation but has been off for several days for urologic procedures. Patient admitted last evening with melena, acute on chronic kidney failure. He was hypotensive and tachycardic in ED. INR not elevated.   [name] recently underwent removal of kidney stones / stent placement. He tells me that last week he had another urologic procedure with removal of more stones. A day or so following the procedure patient developed right flank pain. Pain was constant, as "bad as a kidney stone" but a different kind of pain. During this time patient was also constipated. He took Miralax and subsequently passed a black stool. He passed a couple more black stools over the following couple of days. No bismuth or iron products. Patient presented to ED yesterday feeling lightheaded. He was incontinent of loose yellow stool yesterday.  Per RN, he had a small brown stool last night.   No chronic GI problems. No NSAID use.   Past Medical History  Diagnosis Date  . Hyperlipidemia   . White coat hypertension   . Cardiomyopathy      EF:30-40% in 06/1998, but 50-55% in 2009;  HX ISCHEMIC CARDIOMYOPATHY  . Chronic atrial fibrillation   . Vitamin B12 deficiency   . Chronic anticoagulation   . Arthritis   . Sensory neuropathy   . Chronic kidney disease, stage III (moderate)     creatinine 1.5 2001, 2.0 2009, 2.28 in 12/2009  . Mild renal insufficiency   . Hydronephrosis, bilateral   . Ureteral calculi     BILATERAL  . History of CHF (congestive heart failure)     1999  &  2005  . History of kidney stones   . Bilateral renal cysts   . Aneurysm of infrarenal abdominal aorta 3.9 x 3.6 no change 8/05 no change in 2010;  4.7 in 2013     MONITORED BY DICKSON-- LAST VISIT OCT 2013--  4.7CM  . Chronic venous insufficiency   . PAD (peripheral artery disease)   . Edema of both legs   . History of gout     per pt stable  . Glaucoma of both eyes   . Wears hearing aid     bilateral    Past Surgical History  Procedure Laterality Date  . Repair thoracic aorta  01/1995    s/p rupture  . Cataract extraction w/ intraocular lens  implant, bilateral  2012  . 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 Laparoscopic cholecystectomy  JAN 2005  . Percutaneous nephrostolithotomy  1970's  . Abdominal aortagram  07-16-2003  DR ROTHBART    W/ COIL EMBOLIZATION OF SIX BRANCHES OF RIGHT INTERNAL RENAL ARTERY ANEURYSM  . Cardiovascular stress test  04-25-2003    LOW RISK CARDIOLITE STUDY/ MODERATE LV DILATATION AND IMPAIRMENT OF LVSF/  NO ISCHEMIA  . Transthoracic echocardiogram  02-07-2008  DR ROTHBART    MODERATE DILATED LV/  EF 50-55%/ MILD AR/ MILD TO MODERATE AORTIC ROOT DILATATION/ MODERATE MR/ MODERATE LA   &  RA   DIILATED/ MILD TO MODERATE TR  . Cystoscopy w/ ureteral stent placement Bilateral 10/24/2012  Procedure: CYSTOSCOPY WITH RETROGRADE PYELOGRAM/URETERAL STENT PLACEMENT;  Surgeon: Lindaann Slough, MD;  Location: [facility] [place];  Service: Urology;  Laterality: Bilateral;  . Cystoscopy with ureteroscopy Left 11/27/2012    Procedure: CYSTOSCOPY,LEFT URETEROSCOPY WITH LASER LITHOTRIPSY/REMOVAL OF MIGRATED STENT, PLACEMENT OF LEFT STENT;  Surgeon: Garnett Farm, MD;  Location: [facility];  Service: Urology;  Laterality: Left;  . Holmium laser application Left 11/27/2012    Procedure: HOLMIUM LASER APPLICATION;  Surgeon: Garnett Farm, MD;  Location: [facility];  Service: Urology;  Laterality: Left;  . Cystoscopy with ureteroscopy and stent placement N/A 12/29/2012    Procedure: CYSTOSCOPY WITH URETEROSCOPY AND STENT PLACEMENT, removal of right and left stents, retrogrades, right  stent exchange;  Surgeon: Garnett Farm, MD;  Location: [facility];  Service: Urology;  Laterality: N/A;  . Holmium laser application Right 12/29/2012    Procedure: HOLMIUM LASER APPLICATION;  Surgeon: Garnett Farm, MD;  Location: [facility];  Service: Urology;  Laterality: Right;  HLL OF (RT) URETERAL PELVIC JUNCTION STONE  . Cystoscopy with retrograde pyelogram, ureteroscopy and stent placement Right 01/29/2013    Procedure: CYSTOSCOPY WITH RIGHT URETEROSCOPY AND STENT PLACEMENT;  Surgeon: Garnett Farm, MD;  Location: [facility];  Service: Urology;  Laterality: Right;  . Holmium laser application N/A 01/29/2013    Procedure: HOLMIUM LASER APPLICATION;  Surgeon: Garnett Farm, MD;  Location: [facility];  Service: Urology;  Laterality: N/A;    Family History  Problem Relation Age of Onset  . Heart disease Mother   . Early death Father   . Aneurysm Brother   . Aortic aneurysm Brother   . Diabetes Sister    No colon cancer.   History  Substance Use Topics  . Smoking status: Never Smoker   . Smokeless tobacco: Never Used  . Alcohol Use: No    Prior to Admission medications   Medication Sig Start Date End Date Taking? Authorizing Provider  acetaminophen (TYLENOL) 500 MG tablet Take 1,000 mg by mouth every 6 (six) hours as needed for pain.   Yes Historical Provider, MD  amLODipine (NORVASC) 5 MG tablet Take 5 mg by mouth every morning.  04/27/12  Yes Kathlen Brunswick, MD  Cyanocobalamin (VITAMIN B-12) 2000 MCG TBCR Take 2,000 mcg by mouth daily.    Yes Historical Provider, MD  digoxin (DIGOX) 0.125 MG tablet Take 0.125 mg by mouth every morning. Take 1 tablet by mouth Monday through Friday. 10/02/12  Yes Kathlen Brunswick, MD  doxazosin (CARDURA) 4 MG tablet Take 4 mg by mouth at bedtime.   Yes Historical Provider, MD  furosemide (LASIX) 40 MG tablet Take 40 mg by mouth every morning.  04/27/12  Yes Kathlen Brunswick, MD  glucosamine-chondroitin 500-400 MG tablet Take 2 tablets by mouth daily.    Yes  Historical Provider, MD  HYDROcodone-acetaminophen (NORCO) 10-325 MG per tablet Take 1-2 tablets by mouth every 4 (four) hours as needed for pain. 01/29/13  Yes Garnett Farm, MD  metoprolol (LOPRESSOR) 100 MG tablet Take 100 mg by mouth 2 (two) times daily.   Yes Historical Provider, MD  ondansetron (ZOFRAN-ODT) 4 MG disintegrating tablet Take 4 mg by mouth every 8 (eight) hours as needed for nausea.   Yes Historical Provider, MD  phenazopyridine (PYRIDIUM) 200 MG tablet Take 1 tablet (200 mg total) by mouth 3 (three) times daily as needed for pain. 01/29/13  Yes Garnett Farm, MD  simvastatin (ZOCOR) 20 MG tablet Take 20 mg by  mouth daily.    Yes Historical Provider, MD  latanoprost (XALATAN) 0.005 % ophthalmic solution Place 1 drop into both eyes at bedtime.  08/12/12   Historical Provider, MD  warfarin (COUMADIN) 2.5 MG tablet Take 2.5 mg by mouth See admin instructions. Takes 6 days per week with exception not on wednesday    Historical Provider, MD    Current Facility-Administered Medications  Medication Dose Route Frequency Provider Last Rate Last Dose  . 0.9 %  sodium chloride infusion   Intravenous Continuous Jerald Kief, MD 100 mL/hr at 02/13/13 0917 1,000 mL at 02/13/13 0917  . acetaminophen (TYLENOL) tablet 650 mg  650 mg Oral Q6H PRN Kela Millin, MD       Or  . acetaminophen (TYLENOL) suppository 650 mg  650 mg Rectal Q6H PRN Adeline C Viyuoh, MD      . cefTRIAXone (ROCEPHIN) 1 g in dextrose 5 % 50 mL IVPB  1 g Intravenous Daily Kela Millin, MD   1 g at 02/12/13 2010  . [START ON 02/14/2013] digoxin (LANOXIN) tablet 0.125 mg  0.125 mg Oral QODAY Adeline C Viyuoh, MD      . HYDROcodone-acetaminophen (NORCO) 10-325 MG per tablet 1-2 tablet  1-2 tablet Oral Q6H PRN Adeline C Viyuoh, MD      . latanoprost (XALATAN) 0.005 % ophthalmic solution 1 drop  1 drop Both Eyes QHS Adeline C Viyuoh, MD   1 drop at 02/12/13 2300  . metoprolol (LOPRESSOR) injection 2.5-5 mg  2.5-5 mg  Intravenous Q6H PRN Adeline C Viyuoh, MD      . ondansetron (ZOFRAN) tablet 4 mg  4 mg Oral Q6H PRN Adeline C Viyuoh, MD       Or  . ondansetron (ZOFRAN) injection 4 mg  4 mg Intravenous Q6H PRN Adeline C Viyuoh, MD      . sodium chloride 0.225 % with sodium bicarbonate 100 mEq infusion   Intravenous Continuous Kela Millin, MD 100 mL/hr at 02/13/13 0530    . sodium chloride 0.9 % injection 3 mL  3 mL Intravenous Q12H Adeline C Viyuoh, MD   3 mL at 02/12/13 2300  . vitamin B-12 (CYANOCOBALAMIN) tablet 2,000 mcg  2,000 mcg Oral Daily Kela Millin, MD        Allergies as of 02/12/2013 - Review Complete 02/12/2013  Allergen Reaction Noted  . Sulfa antibiotics Rash 02/02/2012    Review of Systems:    All systems reviewed and negative except where noted in HPI.   Physical Exam:  Vital signs in last 24 hours: Temp:  [97.8 F (36.6 C)-98.2 F (36.8 C)] 98.1 F (36.7 C) (10/21 0814) Pulse Rate:  [68-139] 101 (10/21 0900) Resp:  [11-25] 18 (10/21 0900) BP: (65-155)/(36-92) 103/63 mmHg (10/21 0900) SpO2:  [92 %-100 %] 94 % (10/21 0900) Weight:  [178 lb 9.2 oz (81 kg)] 178 lb 9.2 oz (81 kg) (10/20 2000) Last BM Date: 02/12/13 General:   Pleasant white male in NAD Head:  Normocephalic and atraumatic. Eyes:   No icterus.   Conjunctiva pink. Ears:  Normal auditory acuity. Neck:  Supple; no masses felt Lungs:  Respirations even and unlabored. Lungs clear to auscultation bilaterally.   No wheezes, crackles, or rhonchi.  Heart:  Regular rate and rhythm;  murmur heard. Abdomen:  Soft, nondistended, nontender. Normal bowel sounds. No appreciable masses or hepatomegaly. There is a tender right bulging on right side (below rib cage) in area of old surgical scar. Rectal:  Scant  light brown stool in vault.  Msk:  Symmetrical without gross deformities.  Extremities:  Without edema. Neurologic:  Alert and  oriented x4;  grossly normal neurologically. Skin:  Intact without significant lesions  or rashes. Cervical Nodes:  No significant cervical adenopathy. Psych:  Alert and cooperative. Normal affect.  LAB RESULTS:  Recent Labs  02/12/13 1410 02/12/13 1848 02/12/13 2336 02/13/13 0515  WBC 13.0*  --   --  10.6*  HGB 11.7* 10.8* 10.2* 10.7*  HCT 35.1* 32.3* 30.4* 31.6*  PLT 209  --   --  199   BMET  Recent Labs  02/12/13 1410 02/13/13 0515  NA 134* 134*  K 5.2* 4.1  CL 100 99  CO2 16* 17*  GLUCOSE 108* 96  BUN 106* 110*  CREATININE 5.52* 5.24*  CALCIUM 8.9 8.7   LFT  Recent Labs  02/12/13 1410  PROT 7.1  ALBUMIN 3.1*  AST 10  ALT 8  ALKPHOS 126*  BILITOT 0.5   PT/INR  Recent Labs  02/12/13 1410  LABPROT 15.7*  INR 1.28    STUDIES: Dg Chest 2 View  02/12/2013   CLINICAL DATA:  Stent placement. Rectal bleeding. Fatigue.  EXAM: CHEST  2 VIEW  COMPARISON:  10/24/2012  FINDINGS: Stable moderate cardiomegaly. Tortuous and atherosclerotic thoracic aorta. Prior median sternotomy.  Minimal bibasilar scarring. No pleural effusion noted. Thoracic spondylosis is present.  IMPRESSION: 1. Stable moderate cardiomegaly with tortuous and atherosclerotic thoracic aorta. 2. Stable scarring in the lung bases.   Electronically Signed   By: Herbie Baltimore M.D.   On: 02/12/2013 15:28   US Renal  02/12/2013   CLINICAL DATA:  Elevated creatinine.  EXAM: RENAL/URINARY TRACT ULTRASOUND COMPLETE  COMPARISON:  CT scan from 10/18/2012  FINDINGS: Right Kidney  Length: 19.6 cm. There is a large cystic process in the right renal hilum consistent with marked hydronephrosis, as seen on the previous CT scan.  Left Kidney  Length: 16.0 cm. Moderate left hydronephrosis is evident, but appearance is substantially improved compared to the previous CT scan.  Bladder  Foley balloon is identified within the decompressed urinary bladder.  Abdominal aortic aneurysm measures 4.8 cm.  IMPRESSION: Marked right hydronephrosis, seen on the previous CT scan. There is moderate left hydronephrosis,  improved substantially compared to the prior CT scan.  Abdominal aortic aneurysm.   Electronically Signed   By: Kennith Center M.D.   On: 02/12/2013 16:17   PREVIOUS ENDOSCOPIES:            none   Impression / Plan:   44. 79 year old male with passage of black stool last week. Stool heme positive n ED yesterday but no overt bleeding yesterday or today (stool yellow-brown). Though hemoglobin has been stable since admission (10.2 to 10.8), overall hgb down about 2 grams from baseline of mid 12 range. After resolution of acute medical issues patient may need EGD. Recommend BID PPI and close monitoring for now.    2. Acute right sided pain. There is a a tender bulge on right side which patient reports as new. Renal ultrasound shows a large cystic process in the right renal hilum consistent with marked hydronephrosis.  Urology to see today. Patient is s/p right ureteral stones extraction, lithotripsy and right double J-stent placement 01/29/13.  3. UTI. On Rocephin.   4. Sepsis syndrome. Workup in progress. Rule out urologic process.   5. Multiple medical problems. Patient's home coumadin has been on hold for several days for urologic procedures.  6. AAA, 4.8cm  Thanks   LOS: 1 day   Willette Cluster  02/13/2013, 10:17 AM    Attending physician's note   I have taken a history, examined the patient and reviewed the chart. I agree with the Advanced Practitioner's note, impression and recommendations. Possible melena last week and now has yellow-brown heme + stool. 2 drop in Hb noted. Multiple comorbidities with acute on chronic renal failure, UTI, kidney stone, possible sepsis that need more urgent attention than GI problem. PPI BID for now. Monitor Hb/Hct. EGD in near future pending hospital course.   Meryl Dare, MD Clementeen Graham

## 2013-02-16 NOTE — Progress Notes (Signed)
[facility] PROGRESS NOTE  [name] ZOX:[id number] DOB: 03/29/1926 DOA: 02/12/2013 PCP: [name], MD  Assessment/Plan:  Melena/GIB  -Remains off coumadin. -Hb has remained stable around 10. -No further evidence for GI bleed. -Appreciate GI input. -EGD without evidence for GI bleed. -GI: please advise when ok from your standpoint to resume coumadin.  UTI  -All urine cx without growth. -Will DC rocephin.  Sepsis syndrome /Hypotension  -Resolved.  Atrial fibrillation, chronic  -continue dig- renal dose  -Metoprolol has been restarted with holding parameters due to increasing HR. -remain off coumadin due to GI bleed  Acute renal failure on Chronic kidney disease, stage III  -secondary to hydronephrosis-renal US as above, and #1also contributing  -S/p percutaneous nephrostomy tube today, GU planning on stent placement today. - urology to follow  -Cr much improved this AM. -Recheck renal function in am.  Hyperkalemia/metab acidosis  -due to ARF. -Resolved. Bicarb is 21 today. -Hyperkalemia resolved as well.  CM/CHF  -compensated, monitor fluid status closely and further treat as appropriate. - last documented EF of 50-55% on 10/09.  Hydronephrosis - s/p perc nephrostomy. -For stent placement today.  Esophageal Candidiasis -Fluconazole x 5 days.  Code Status: Full Family Communication: Patient only Disposition Plan: Home today vs tomorrow depending on timing of stent placement.   Consultants:  GI  Antibiotics:  None  HPI/Subjective: No acute events noted overnight  Objective: Filed Vitals:   02/16/13 0900 02/16/13 0908 02/16/13 0910 02/16/13 0920  BP: 137/89 122/69 122/69 129/79  Pulse: 83     Temp:   98.1 F (36.7 C)   TempSrc:   Oral   Resp: 19 38 30 23  Height:      Weight:      SpO2: 93% 91% 96% 95%    Intake/Output Summary (Last 24 hours) at 02/16/13 1307 Last data filed at 02/16/13 1100  Gross per 24 hour  Intake   3260 ml   Output   2500 ml  Net    760 ml   Filed Weights   02/12/13 2000  Weight: 81 kg (178 lb 9.2 oz)    Exam:   General:  Awake, in nad  Cardiovascular: regular, s1, s2  Respiratory: normal resp effort, no wheezing  Abdomen: soft, nondistended  Musculoskeletal: perfused, no clubbing   Data Reviewed: Basic Metabolic Panel:  Recent Labs Lab 02/12/13 1410 02/13/13 0515 02/14/13 0344 02/16/13 0425  NA 134* 134* 138 137  K 5.2* 4.1 3.8 3.7  CL 100 99 103 106  CO2 16* 17* 21 23  GLUCOSE 108* 96 110* 102*  BUN 106* 110* 94* 51*  CREATININE 5.52* 5.24* 4.22* 2.44*  CALCIUM 8.9 8.7 8.4 8.0*   Liver Function Tests:  Recent Labs Lab 02/12/13 1410 02/14/13 0344  AST 10 11  ALT 8 8  ALKPHOS 126* 77  BILITOT 0.5 0.3  PROT 7.1 6.1  ALBUMIN 3.1* 2.5*   No results found for this basename: LIPASE, AMYLASE,  in the last 168 hours No results found for this basename: AMMONIA,  in the last 168 hours CBC:  Recent Labs Lab 02/12/13 1410  02/12/13 2336 02/13/13 0515 02/13/13 1200 02/14/13 0344 02/16/13 0425  WBC 13.0*  --   --  10.6*  --  8.7 10.1  NEUTROABS  --   --   --   --   --  7.3  --   HGB 11.7*  < > 10.2* 10.7* 10.8* 10.6* 9.8*  HCT 35.1*  < > 30.4* 31.6*  32.1* 31.7* 29.8*  MCV 93.1  --   --  92.1  --  91.9 94.6  PLT 209  --   --  199  --  193 192  < > = values in this interval not displayed. Cardiac Enzymes: No results found for this basename: CKTOTAL, CKMB, CKMBINDEX, TROPONINI,  in the last 168 hours BNP (last 3 results) No results found for this basename: PROBNP,  in the last 8760 hours CBG: No results found for this basename: GLUCAP,  in the last 168 hours  Recent Results (from the past 240 hour(s))  URINE CULTURE     Status: None   Collection Time    02/12/13  3:13 PM      Result Value Range Status   Specimen Description URINE, RANDOM   Final   Special Requests NONE   Final   Culture  Setup Time     Final   Value: 02/13/2013 03:13     Performed  at [facility] Count     Final   Value: NO GROWTH     Performed at [facility]   Culture     Final   Value: NO GROWTH     Performed at [facility]   Report Status 02/14/2013 FINAL   Final  MRSA PCR SCREENING     Status: None   Collection Time    02/12/13  5:24 PM      Result Value Range Status   MRSA by PCR NEGATIVE  NEGATIVE Final   Comment:            The GeneXpert MRSA Assay (FDA     approved for NASAL specimens     only), is one component of a     comprehensive MRSA colonization     surveillance program. It is not     intended to diagnose MRSA     infection nor to guide or     monitor treatment for     MRSA infections.  CULTURE, BLOOD (ROUTINE X 2)     Status: None   Collection Time    02/12/13  6:30 PM      Result Value Range Status   Specimen Description BLOOD LEFT HAND   Final   Special Requests BOTTLES DRAWN AEROBIC AND ANAEROBIC 10CC   Final   Culture  Setup Time     Final   Value: 02/12/2013 21:53     Performed at [facility]   Culture     Final   Value:        BLOOD CULTURE RECEIVED NO GROWTH TO DATE CULTURE WILL BE HELD FOR 5 DAYS BEFORE ISSUING A FINAL NEGATIVE REPORT     Performed at [facility]   Report Status PENDING   Incomplete  CULTURE, BLOOD (ROUTINE X 2)     Status: None   Collection Time    02/12/13  6:46 PM      Result Value Range Status   Specimen Description BLOOD LEFT ARM   Final   Special Requests BOTTLES DRAWN AEROBIC AND ANAEROBIC 10CC   Final   Culture  Setup Time     Final   Value: 02/12/2013 21:53     Performed at [facility]   Culture     Final   Value:        BLOOD CULTURE RECEIVED NO GROWTH TO DATE CULTURE WILL BE HELD FOR 5 DAYS BEFORE ISSUING A FINAL NEGATIVE  REPORT     Performed at [facility]   Report Status PENDING   Incomplete  URINE CULTURE     Status: None   Collection Time    02/14/13 10:14 AM      Result Value Range Status   Specimen Description  URINE, CATHETERIZED   Final   Special Requests rocephin   Final   Culture  Setup Time     Final   Value: 02/14/2013 13:02     Performed at [facility]   Colony Count     Final   Value: NO GROWTH     Performed at [facility]   Culture     Final   Value: NO GROWTH     Performed at [facility]   Report Status 02/15/2013 FINAL   Final  URINE CULTURE     Status: None   Collection Time    02/14/13  3:30 PM      Result Value Range Status   Specimen Description URINE, CATHETERIZED RIGHT NEPHROSTOMY TUBE   Final   Special Requests NONE   Final   Culture  Setup Time     Final   Value: 02/14/2013 22:20     Performed at [facility] Count     Final   Value: NO GROWTH     Performed at [facility]   Culture     Final   Value: NO GROWTH     Performed at [facility]   Report Status 02/15/2013 FINAL   Final     Studies: No results found.  Scheduled Meds: . cefTRIAXone (ROCEPHIN)  IV  1 g Intravenous Daily  . digoxin  0.125 mg Oral QODAY  . feeding supplement (ENSURE COMPLETE)  237 mL Oral Q24H  . fluconazole  100 mg Oral Daily  . latanoprost  1 drop Both Eyes QHS  . metoprolol  100 mg Oral BID  . pantoprazole  40 mg Oral BID  . sodium chloride  3 mL Intravenous Q12H  . vitamin B-12  2,000 mcg Oral Daily   Continuous Infusions: . sodium chloride 500 mL (02/16/13 0806)    Active Problems:   Atrial fibrillation, chronic   Chronic anticoagulation   Chronic kidney disease, stage III (moderate)   Acute renal failure   Hypotension   Melena   Anemia   Urinary tract infection, site not specified   Sepsis   Nonspecific abnormal finding in stool contents   Esophageal candidiasis  Time spent:  [facility]  [facility] Pager [phone]. If 7PM-7AM, please contact night-coverage at [email or web address], password [facility] 02/16/2013, 1:07 PM  LOS: 4 days

## 2013-02-17 LAB — BASIC METABOLIC PANEL
CO2: 23 mEq/L (ref 19–32)
Chloride: 109 mEq/L (ref 96–112)
Creatinine, Ser: 2.19 mg/dL — ABNORMAL HIGH (ref 0.50–1.35)
GFR calc non Af Amer: 26 mL/min — ABNORMAL LOW (ref >90)
Glucose, Bld: 97 mg/dL (ref 70–99)
Potassium: 4 mEq/L (ref 3.5–5.1)
Sodium: 139 mEq/L (ref 135–145)

## 2013-02-17 MED ORDER — FLUCONAZOLE 100 MG PO TABS: 100.0000 mg | ORAL_TABLET | Freq: Every day | ORAL | Status: DC

## 2013-02-17 NOTE — Discharge Summary (Signed)
Physician Discharge Summary  [name]:[id number] DOB: [birth year] DOA: 02/12/2013  PCP: [name], MD  Admit date: 02/12/2013 Discharge date: 02/17/2013  Time spent: 45 minutes  Recommendations for Outpatient Follow-up:  -Will be discharged back home today. -Will need to follow up with PCP and with Dr. [name] in 2 weeks.   Discharge Diagnoses:  Active Problems:   Atrial fibrillation, chronic   Chronic anticoagulation   Chronic kidney disease, stage III (moderate)   Acute renal failure   Hypotension   Melena   Anemia   Urinary tract infection, site not specified   Sepsis   Nonspecific abnormal finding in stool contents   Esophageal candidiasis   Discharge Condition: Stable and improved  Filed Weights   02/12/13 2000  Weight: 81 kg (178 lb 9.2 oz)    History of present illness:  [name] is a 79 y.o. male with multiple medical problems as listed below including chronic afib on coumadin- but has been off (the coumadin) for about 10days or so for his urologic procedures per Dr [name], CKD stage 3, bilat hydronephrosis, ureteral calculi, recently hospitalized (10/6) for Right ureteral stone extraction/Right ureteral stent removal/Right double-J stent placement and followed up last week for stent removal per urology and presented with melena. He states that he was constipated following most recent procedures and was prescribes miralax, and reported Melanotic stools x 3days. He denies n/v, and reports having had some R. Sided abd pain initially but that resolved. He came to see Dr [name] at office today and was asked to come the EDP. In ED hgb was 11.7(down from 13.1 on 10/6), and cr 5.52-from 2.48 on 10/6, k. 5.2 and CO2 16, UA c/w UTI And BP dropped to 90s. Renal US was done and showed Marked right hydronephrosis, as on previous CT scan and moderate left hydronephrosis, improved substantially compared prior. Dr [name] was consulted per EDP and states he will follow pt.  He has a foley in place. He is admitted for further eval and management. He admits to decreased PO intake and gen. weakness, denies N/V, and no Nsaid use reported. We were asked to admit him for further evaluation and management.   Hospital Course:   Melena/GIB  -Remains off coumadin.  -Hb has remained stable around 10.  -No further evidence for GI bleed.  -Appreciate GI input.  -EGD without evidence for GI bleed.  UTI  -All urine cx without growth.  -Will DC rocephin.   Sepsis syndrome /Hypotension  -Resolved.   Atrial fibrillation, chronic  -continue dig- renal dose  -Metoprolol has been restarted. -remain off coumadin due to GI bleed and continued need for urologic procedures.  Acute renal failure on Chronic kidney disease, stage III  -secondary to hydronephrosis, and #1also contributing  -S/p percutaneous nephrostomy tube today, down to 2.19 from >5 on admission. - urology to follow as an OP as stent was unable to be placed yesterday. -Will DC with percutaneous nephrostomy tube.  Hyperkalemia/metab acidosis  -due to ARF.  -Resolved. Bicarb is 21 today.  -Hyperkalemia resolved as well.   CM/CHF  -compensated, monitor fluid status closely and further treat as appropriate.  - last documented EF of 50-55% on 10/09.   Hydronephrosis  - s/p perc nephrostomy.  -FAs above, stent was unable to be placed and will need continued urologic follow up.   Esophageal Candidiasis  -Fluconazole x 5 days of which 4 days are remaining on DC.   Procedures:  PCN   Consultations:  Urology  IR  Discharge Instructions  Discharge Orders   Future Appointments Provider Department Dept Phone   02/28/2013 10:00 AM Antoine Poche, MD [facility] : [facility] [name] [phone]   Future Orders Complete By Expires   Diet - low sodium heart healthy  As directed    Discontinue Foley catheter with string of bottles  As directed    Discontinue IV  As directed    Increase activity slowly   As directed        Medication List    STOP taking these medications       warfarin 2.5 MG tablet  Commonly known as:  COUMADIN      TAKE these medications       acetaminophen 500 MG tablet  Commonly known as:  TYLENOL  Take 1,000 mg by mouth every 6 (six) hours as needed for pain.     amLODipine 5 MG tablet  Commonly known as:  NORVASC  Take 5 mg by mouth every morning.     DIGOX 0.125 MG tablet  Generic drug:  digoxin  Take 0.125 mg by mouth every morning. Take 1 tablet by mouth Monday through Friday.     doxazosin 4 MG tablet  Commonly known as:  CARDURA  Take 4 mg by mouth at bedtime.     fluconazole 100 MG tablet  Commonly known as:  DIFLUCAN  Take 1 tablet (100 mg total) by mouth daily.     furosemide 40 MG tablet  Commonly known as:  LASIX  Take 40 mg by mouth every morning.     glucosamine-chondroitin 500-400 MG tablet  Take 2 tablets by mouth daily.     HYDROcodone-acetaminophen 10-325 MG per tablet  Commonly known as:  NORCO  Take 1-2 tablets by mouth every 4 (four) hours as needed for pain.     latanoprost 0.005 % ophthalmic solution  Commonly known as:  XALATAN  Place 1 drop into both eyes at bedtime.     metoprolol 100 MG tablet  Commonly known as:  LOPRESSOR  Take 100 mg by mouth 2 (two) times daily.     ondansetron 4 MG disintegrating tablet  Commonly known as:  ZOFRAN-ODT  Take 4 mg by mouth every 8 (eight) hours as needed for nausea.     phenazopyridine 200 MG tablet  Commonly known as:  PYRIDIUM  Take 1 tablet (200 mg total) by mouth 3 (three) times daily as needed for pain.     simvastatin 20 MG tablet  Commonly known as:  ZOCOR  Take 20 mg by mouth daily.     Vitamin B-12 2000 MCG Tbcr  Take 2,000 mcg by mouth daily.       Allergies  Allergen Reactions  . Sulfa Antibiotics Rash    Nausea        Follow-up Information   Follow up with Garnett Farm, MD. Call in 2 weeks.   Specialty:  Urology   Contact information:    [address] [place] [address] [phone]       Follow up with [name], MD. Schedule an appointment as soon as possible for a visit in 2 weeks.   Specialty:  Family Medicine   Contact information:   [address] [place] [address] [phone]        The results of significant diagnostics from this hospitalization (including imaging, microbiology, ancillary and laboratory) are listed below for reference.    Significant Diagnostic Studies: Dg Chest 2 View  02/12/2013   CLINICAL DATA:  Stent placement. Rectal bleeding. Fatigue.  EXAM: CHEST  2 VIEW  COMPARISON:  10/24/2012  FINDINGS: Stable moderate cardiomegaly. Tortuous and atherosclerotic thoracic aorta. Prior median sternotomy.  Minimal bibasilar scarring. No pleural effusion noted. Thoracic spondylosis is present.  IMPRESSION: 1. Stable moderate cardiomegaly with tortuous and atherosclerotic thoracic aorta. 2. Stable scarring in the lung bases.   Electronically Signed   By: Herbie Baltimore M.D.   On: 02/12/2013 15:28   Kub Day Of Procedure  01/29/2013   CLINICAL DATA:  Preop for proximal right ureteral stone.  EXAM: ABDOMEN - 1 VIEW  COMPARISON:  One-view abdomen 12/29/2012 and 01/09/2013.  FINDINGS: The right-sided ureteral stent is stable in position. The previously seen proximal stone is not visualized on today's exam. Vascular stents and coils are again noted. Atherosclerotic changes are evident.  IMPRESSION: 1. Right ureteral stent in situ. 2. The previously noted proximal stone is not evident on this radiograph. 3. Stable vascular stents and coils.   Electronically Signed   By: Gennette Pac M.D.   On: 01/29/2013 10:13   US Renal  02/12/2013   CLINICAL DATA:  Elevated creatinine.  EXAM: RENAL/URINARY TRACT ULTRASOUND COMPLETE  COMPARISON:  CT scan from 10/18/2012  FINDINGS: Right Kidney  Length: 19.6 cm. There is a large cystic process in the right renal hilum consistent with marked hydronephrosis,  as seen on the previous CT scan.  Left Kidney  Length: 16.0 cm. Moderate left hydronephrosis is evident, but appearance is substantially improved compared to the previous CT scan.  Bladder  Foley balloon is identified within the decompressed urinary bladder.  Abdominal aortic aneurysm measures 4.8 cm.  IMPRESSION: Marked right hydronephrosis, seen on the previous CT scan. There is moderate left hydronephrosis, improved substantially compared to the prior CT scan.  Abdominal aortic aneurysm.   Electronically Signed   By: Kennith Center M.D.   On: 02/12/2013 16:17   Ir Nephrostomy Tube Change  02/16/2013   CLINICAL DATA:  History of severe right-sided hydronephrosis secondary to ureteral obstruction, post recent nephrostomy catheter placement. Please perform antegrade nephrostogram with possible placement of nephro ureteral stent.  EXAM: 1. RIGHT SIDED ANTEGRADE NEPHROSTOGRAM. 2. RIGHT SIDED PERCUTANEOUS NEPHROSTOMY CATHETER EXCHANGE  MEDICATIONS: Fentanyl 100 mcg IV  ANESTHESIA/SEDATION: Total Moderate Sedation Time  20 minutes.  CONTRAST:  75mL CYSTOGRAFIN/HYPAQUE DIATRIZOATE MEGLUMINE 30 % UR SOLN  COMPARISON:  Ultrasound of fluoroscopic guided right-sided nephrostomy catheter placement - 02/14/2013; CT abdomen and pelvis -10/16/2012  FLUOROSCOPY TIME:  10 minutes.  12 seconds.  PROCEDURE: The procedure, risks, benefits, and alternatives were explained to the patient. Questions regarding the procedure were encouraged and answered. The patient understands and consents to the procedure.  The nephrostomy tubes and surrounding skin were prepped with Betadine in a sterile fashion, and a sterile drape was applied covering the operative field. A sterile gown and sterile gloves were used for the procedure. Local anesthesia was provided with 1% Lidocaine.  The preexisting right-sided nephrostomy tube was injected with contrast material. Fluoroscopic spot images were obtained of the collecting system. The existing  nephrostomy catheter was cut and cannulated with a Benson wire. Under intermittent fluoroscopic guidance, the existing nephrostomy catheter was exchanged for a 9-French 35 cm vascular sheath which was advanced into the renal pelvis.  With the use of a regular Glidewire, a Kumpe the catheter was advanced into the caudal aspect of the right renal collecting system. Contrast injection from this caudal location failed to delineate passage of  contrast beyond the superior aspect of the right ureter. Despite prolonged efforts, no contrast would pass beyond the superior aspect of the right ureter.  As such, over an Amplatz wire, the vascular sheath was exchanged for a 10 French percutaneous nephrostomy catheter which was coiled within the caudal aspect of the right renal pelvis. Limited contrast injection Bern appropriate positioning. The nephrostomy catheter was secured at the skin exit site within interrupted suture. Dressing was placed. Nephrostomy was reconnected to a gravity bag. The patient tolerated the procedures well without immediate postprocedural complication.  COMPLICATIONS: None immediate  FINDINGS: Right-sided antegrade nephrostogram demonstrates improved though persistent SEVERE right-sided pelvicaliectasis with lack of any recognizable features of the right renal collecting system.  Despite contrast injection from the caudal aspect of the right renal pelvis, no contrast would pass beyond the superior aspect of the right ureter.  Successful fluoroscopic guided exchange of right-sided 10 French nephrostomy catheter.  IMPRESSION: 1. Right-sided antegrade nephrostogram demonstrates improved but persistent SEVERE right-sided pelvicaliectasis and occlusion of the superior aspect of the right ureter. 2. Successful fluoroscopic guided exchange of right-sided 10.2 French nephrostomy catheters. PLAN:  Would recommend several weeks of right-sided urinary decompression via the nephrostomy before repeated attempted  antegrade nephrostogram and potential right-sided nephro ureteral stent placement.   Electronically Signed   By: Simonne Come M.D.   On: 02/16/2013 18:14   Ir Nephrostogram Right  02/16/2013   CLINICAL DATA:  History of severe right-sided hydronephrosis secondary to ureteral obstruction, post recent nephrostomy catheter placement. Please perform antegrade nephrostogram with possible placement of nephro ureteral stent.  EXAM: 1. RIGHT SIDED ANTEGRADE NEPHROSTOGRAM. 2. RIGHT SIDED PERCUTANEOUS NEPHROSTOMY CATHETER EXCHANGE  MEDICATIONS: Fentanyl 100 mcg IV  ANESTHESIA/SEDATION: Total Moderate Sedation Time  20 minutes.  CONTRAST:  75mL CYSTOGRAFIN/HYPAQUE DIATRIZOATE MEGLUMINE 30 % UR SOLN  COMPARISON:  Ultrasound of fluoroscopic guided right-sided nephrostomy catheter placement - 02/14/2013; CT abdomen and pelvis -10/16/2012  FLUOROSCOPY TIME:  10 minutes.  12 seconds.  PROCEDURE: The procedure, risks, benefits, and alternatives were explained to the patient. Questions regarding the procedure were encouraged and answered. The patient understands and consents to the procedure.  The nephrostomy tubes and surrounding skin were prepped with Betadine in a sterile fashion, and a sterile drape was applied covering the operative field. A sterile gown and sterile gloves were used for the procedure. Local anesthesia was provided with 1% Lidocaine.  The preexisting right-sided nephrostomy tube was injected with contrast material. Fluoroscopic spot images were obtained of the collecting system. The existing nephrostomy catheter was cut and cannulated with a Benson wire. Under intermittent fluoroscopic guidance, the existing nephrostomy catheter was exchanged for a 9-French 35 cm vascular sheath which was advanced into the renal pelvis.  With the use of a regular Glidewire, a Kumpe the catheter was advanced into the caudal aspect of the right renal collecting system. Contrast injection from this caudal location failed to  delineate passage of contrast beyond the superior aspect of the right ureter. Despite prolonged efforts, no contrast would pass beyond the superior aspect of the right ureter.  As such, over an Amplatz wire, the vascular sheath was exchanged for a 10 French percutaneous nephrostomy catheter which was coiled within the caudal aspect of the right renal pelvis. Limited contrast injection Bern appropriate positioning. The nephrostomy catheter was secured at the skin exit site within interrupted suture. Dressing was placed. Nephrostomy was reconnected to a gravity bag. The patient tolerated the procedures well without immediate postprocedural complication.  COMPLICATIONS: None immediate  FINDINGS: Right-sided antegrade nephrostogram demonstrates improved though persistent SEVERE right-sided pelvicaliectasis with lack of any recognizable features of the right renal collecting system.  Despite contrast injection from the caudal aspect of the right renal pelvis, no contrast would pass beyond the superior aspect of the right ureter.  Successful fluoroscopic guided exchange of right-sided 10 French nephrostomy catheter.  IMPRESSION: 1. Right-sided antegrade nephrostogram demonstrates improved but persistent SEVERE right-sided pelvicaliectasis and occlusion of the superior aspect of the right ureter. 2. Successful fluoroscopic guided exchange of right-sided 10.2 French nephrostomy catheters. PLAN:  Would recommend several weeks of right-sided urinary decompression via the nephrostomy before repeated attempted antegrade nephrostogram and potential right-sided nephro ureteral stent placement.   Electronically Signed   By: Simonne Come M.D.   On: 02/16/2013 18:14   Ir Perc Nephrostomy Right  02/14/2013   CLINICAL DATA:  Right flank pain and severe hydronephrosis. Poor renal function.  EXAM: PERC NEPHROSTOMY*R*; IR ULTRASOUND GUIDANCE TISSUE ABLATION  Physician: Rachelle Hora. Lowella Dandy, MD  MEDICATIONS: Versed 0.5 mg. Fentanyl 25 mcg.  Rocephin 1 g. A radiology nurse monitored the patient for moderate sedation.  ANESTHESIA/SEDATION: Moderate sedation time: 20 min  FLUOROSCOPY TIME:  3 min and 18 seconds  PROCEDURE: The procedure was explained to the patient. The risks and benefits of the procedure were discussed and the patient's questions were addressed. Informed consent was obtained from the patient. The patient was placed prone on the interventional table. The right kidney was identified with ultrasound. The right flank was marked. The skin was prepped and draped in sterile fashion. Maximal barrier sterile technique was utilized including caps, mask, sterile gowns, sterile gloves, sterile drape, hand hygiene and skin antiseptic. The skin was anesthetized with 1% lidocaine. Using ultrasound guidance, a 21 gauge needle was directed into a lower pole dilated calyx. Needle position was confirmed within the collecting system with ultrasound. Contrast was injected to confirm placement within the calyx. A 0.018 wire was placed and a dilator set was placed. A Bentson wire was advanced into the renal pelvis with a Kumpe catheter. The tract was dilated and a 10 [place] multipurpose drain was placed. 2 liters of yellow urine was removed. A sample was sent for Gram stain and culture. Catheter was sutured to the skin and attached to gravity bag. Fluoroscopic and ultrasound images were taken and saved for documentation.  COMPLICATIONS: None  FINDINGS: Massive right hydronephrosis. Percutaneous access was obtained from a lower pole calyx. Pigtail catheter in the renal pelvis. 2 liters of urine was removed.  IMPRESSION: Successful right percutaneous nephrostomy tube with ultrasound and fluoroscopic guidance.   Electronically Signed   By: Richarda Overlie M.D.   On: 02/14/2013 10:35   Ir US Guide Bx Asp/drain  02/14/2013   CLINICAL DATA:  Right flank pain and severe hydronephrosis. Poor renal function.  EXAM: PERC NEPHROSTOMY*R*; IR ULTRASOUND GUIDANCE TISSUE  ABLATION  Physician: Rachelle Hora. Lowella Dandy, MD  MEDICATIONS: Versed 0.5 mg. Fentanyl 25 mcg. Rocephin 1 g. A radiology nurse monitored the patient for moderate sedation.  ANESTHESIA/SEDATION: Moderate sedation time: 20 min  FLUOROSCOPY TIME:  3 min and 18 seconds  PROCEDURE: The procedure was explained to the patient. The risks and benefits of the procedure were discussed and the patient's questions were addressed. Informed consent was obtained from the patient. The patient was placed prone on the interventional table. The right kidney was identified with ultrasound. The right flank was marked. The skin was prepped and draped in sterile fashion. Maximal barrier sterile technique  was utilized including caps, mask, sterile gowns, sterile gloves, sterile drape, hand hygiene and skin antiseptic. The skin was anesthetized with 1% lidocaine. Using ultrasound guidance, a 21 gauge needle was directed into a lower pole dilated calyx. Needle position was confirmed within the collecting system with ultrasound. Contrast was injected to confirm placement within the calyx. A 0.018 wire was placed and a dilator set was placed. A Bentson wire was advanced into the renal pelvis with a Kumpe catheter. The tract was dilated and a 10 [place] multipurpose drain was placed. 2 liters of yellow urine was removed. A sample was sent for Gram stain and culture. Catheter was sutured to the skin and attached to gravity bag. Fluoroscopic and ultrasound images were taken and saved for documentation.  COMPLICATIONS: None  FINDINGS: Massive right hydronephrosis. Percutaneous access was obtained from a lower pole calyx. Pigtail catheter in the renal pelvis. 2 liters of urine was removed.  IMPRESSION: Successful right percutaneous nephrostomy tube with ultrasound and fluoroscopic guidance.   Electronically Signed   By: Richarda Overlie M.D.   On: 02/14/2013 10:35   Dg C-arm 61-120 Min-no Report  01/29/2013   CLINICAL DATA: intra op   C-ARM 61-120 MINUTES   Fluoroscopy was utilized by the requesting physician.  No radiographic  interpretation.     Microbiology: Recent Results (from the past 240 hour(s))  URINE CULTURE     Status: None   Collection Time    02/12/13  3:13 PM      Result Value Range Status   Specimen Description URINE, RANDOM   Final   Special Requests NONE   Final   Culture  Setup Time     Final   Value: 02/13/2013 03:13     Performed at [facility] Count     Final   Value: NO GROWTH     Performed at [facility]   Culture     Final   Value: NO GROWTH     Performed at [facility]   Report Status 02/14/2013 FINAL   Final  MRSA PCR SCREENING     Status: None   Collection Time    02/12/13  5:24 PM      Result Value Range Status   MRSA by PCR NEGATIVE  NEGATIVE Final   Comment:            The GeneXpert MRSA Assay (FDA     approved for NASAL specimens     only), is one component of a     comprehensive MRSA colonization     surveillance program. It is not     intended to diagnose MRSA     infection nor to guide or     monitor treatment for     MRSA infections.  CULTURE, BLOOD (ROUTINE X 2)     Status: None   Collection Time    02/12/13  6:30 PM      Result Value Range Status   Specimen Description BLOOD LEFT HAND   Final   Special Requests BOTTLES DRAWN AEROBIC AND ANAEROBIC 10CC   Final   Culture  Setup Time     Final   Value: 02/12/2013 21:53     Performed at [facility]   Culture     Final   Value:        BLOOD CULTURE RECEIVED NO GROWTH TO DATE CULTURE WILL BE HELD FOR 5 DAYS BEFORE ISSUING A FINAL NEGATIVE REPORT  Performed at [facility]   Report Status PENDING   Incomplete  CULTURE, BLOOD (ROUTINE X 2)     Status: None   Collection Time    02/12/13  6:46 PM      Result Value Range Status   Specimen Description BLOOD LEFT ARM   Final   Special Requests BOTTLES DRAWN AEROBIC AND ANAEROBIC 10CC   Final   Culture  Setup Time     Final   Value:  02/12/2013 21:53     Performed at [facility]   Culture     Final   Value:        BLOOD CULTURE RECEIVED NO GROWTH TO DATE CULTURE WILL BE HELD FOR 5 DAYS BEFORE ISSUING A FINAL NEGATIVE REPORT     Performed at [facility]   Report Status PENDING   Incomplete  URINE CULTURE     Status: None   Collection Time    02/14/13 10:14 AM      Result Value Range Status   Specimen Description URINE, CATHETERIZED   Final   Special Requests rocephin   Final   Culture  Setup Time     Final   Value: 02/14/2013 13:02     Performed at [facility] Count     Final   Value: NO GROWTH     Performed at [facility]   Culture     Final   Value: NO GROWTH     Performed at [facility]   Report Status 02/15/2013 FINAL   Final  URINE CULTURE     Status: None   Collection Time    02/14/13  3:30 PM      Result Value Range Status   Specimen Description URINE, CATHETERIZED RIGHT NEPHROSTOMY TUBE   Final   Special Requests NONE   Final   Culture  Setup Time     Final   Value: 02/14/2013 22:20     Performed at [facility] Count     Final   Value: NO GROWTH     Performed at [facility]   Culture     Final   Value: NO GROWTH     Performed at [facility]   Report Status 02/15/2013 FINAL   Final     Labs: Basic Metabolic Panel:  Recent Labs Lab 02/12/13 1410 02/13/13 0515 02/14/13 0344 02/16/13 0425 02/17/13 0433  NA 134* 134* 138 137 139  K 5.2* 4.1 3.8 3.7 4.0  CL 100 99 103 106 109  CO2 16* 17* 21 23 23   GLUCOSE 108* 96 110* 102* 97  BUN 106* 110* 94* 51* 40*  CREATININE 5.52* 5.24* 4.22* 2.44* 2.19*  CALCIUM 8.9 8.7 8.4 8.0* 8.0*   Liver Function Tests:  Recent Labs Lab 02/12/13 1410 02/14/13 0344  AST 10 11  ALT 8 8  ALKPHOS 126* 77  BILITOT 0.5 0.3  PROT 7.1 6.1  ALBUMIN 3.1* 2.5*   No results found for this basename: LIPASE, AMYLASE,  in the last 168 hours No results found for this  basename: AMMONIA,  in the last 168 hours CBC:  Recent Labs Lab 02/12/13 1410  02/12/13 2336 02/13/13 0515 02/13/13 1200 02/14/13 0344 02/16/13 0425  WBC 13.0*  --   --  10.6*  --  8.7 10.1  NEUTROABS  --   --   --   --   --  7.3  --  HGB 11.7*  < > 10.2* 10.7* 10.8* 10.6* 9.8*  HCT 35.1*  < > 30.4* 31.6* 32.1* 31.7* 29.8*  MCV 93.1  --   --  92.1  --  91.9 94.6  PLT 209  --   --  199  --  193 192  < > = values in this interval not displayed. Cardiac Enzymes: No results found for this basename: CKTOTAL, CKMB, CKMBINDEX, TROPONINI,  in the last 168 hours BNP: BNP (last 3 results) No results found for this basename: PROBNP,  in the last 8760 hours CBG: No results found for this basename: GLUCAP,  in the last 168 hours     Signed:  Chaya Jan  [facility] Pager: [phone] 02/17/2013, 10:08 AM

## 2013-02-18 LAB — CULTURE, BLOOD (ROUTINE X 2): Culture: NO GROWTH

## 2013-02-19 ENCOUNTER — Encounter (HOSPITAL_COMMUNITY): Payer: Self-pay | Admitting: Gastroenterology

## 2013-02-19 ENCOUNTER — Encounter: Payer: Self-pay | Admitting: Gastroenterology

## 2013-02-20 ENCOUNTER — Telehealth: Payer: Self-pay

## 2013-02-20 NOTE — Telephone Encounter (Signed)
That is his choice. We did not find a source of bleeding/heme + stool on EGD.

## 2013-02-20 NOTE — Telephone Encounter (Signed)
Per EGD report 02/16/13 patient needs outpatient colonoscopy.  I have contacted the patient and he does not want to schedule.  He is not interested in a colonoscopy at all.

## 2013-02-28 ENCOUNTER — Ambulatory Visit: Payer: Medicare Other | Admitting: Cardiology

## 2013-03-08 ENCOUNTER — Other Ambulatory Visit: Payer: Self-pay | Admitting: Urology

## 2013-03-08 ENCOUNTER — Other Ambulatory Visit: Payer: Self-pay | Admitting: Radiology

## 2013-03-08 DIAGNOSIS — N135 Crossing vessel and stricture of ureter without hydronephrosis: Secondary | ICD-10-CM

## 2013-03-09 ENCOUNTER — Encounter (HOSPITAL_COMMUNITY): Payer: Self-pay | Admitting: Pharmacy Technician

## 2013-03-12 ENCOUNTER — Other Ambulatory Visit: Payer: Self-pay | Admitting: Urology

## 2013-03-12 ENCOUNTER — Encounter (HOSPITAL_COMMUNITY): Payer: Self-pay

## 2013-03-12 ENCOUNTER — Ambulatory Visit (HOSPITAL_COMMUNITY)
Admission: RE | Admit: 2013-03-12 | Discharge: 2013-03-12 | Disposition: A | Discharge status: Home / Self Care | Payer: Medicare Other | Source: Ambulatory Visit | Attending: Urology | Admitting: Urology

## 2013-03-12 VITALS — BP 90/54 | HR 62 | Temp 97.5°F | Resp 16

## 2013-03-12 DIAGNOSIS — I714 Abdominal aortic aneurysm, without rupture, unspecified: Secondary | ICD-10-CM | POA: Insufficient documentation

## 2013-03-12 DIAGNOSIS — N201 Calculus of ureter: Secondary | ICD-10-CM | POA: Insufficient documentation

## 2013-03-12 DIAGNOSIS — N2889 Other specified disorders of kidney and ureter: Secondary | ICD-10-CM

## 2013-03-12 DIAGNOSIS — I509 Heart failure, unspecified: Secondary | ICD-10-CM | POA: Insufficient documentation

## 2013-03-12 DIAGNOSIS — N183 Chronic kidney disease, stage 3 unspecified: Secondary | ICD-10-CM | POA: Insufficient documentation

## 2013-03-12 DIAGNOSIS — Z79899 Other long term (current) drug therapy: Secondary | ICD-10-CM | POA: Insufficient documentation

## 2013-03-12 DIAGNOSIS — N1330 Unspecified hydronephrosis: Secondary | ICD-10-CM | POA: Insufficient documentation

## 2013-03-12 DIAGNOSIS — Z436 Encounter for attention to other artificial openings of urinary tract: Secondary | ICD-10-CM | POA: Insufficient documentation

## 2013-03-12 DIAGNOSIS — I2589 Other forms of chronic ischemic heart disease: Secondary | ICD-10-CM | POA: Insufficient documentation

## 2013-03-12 DIAGNOSIS — N135 Crossing vessel and stricture of ureter without hydronephrosis: Secondary | ICD-10-CM

## 2013-03-12 DIAGNOSIS — I4891 Unspecified atrial fibrillation: Secondary | ICD-10-CM | POA: Insufficient documentation

## 2013-03-12 DIAGNOSIS — E785 Hyperlipidemia, unspecified: Secondary | ICD-10-CM | POA: Insufficient documentation

## 2013-03-12 DIAGNOSIS — I739 Peripheral vascular disease, unspecified: Secondary | ICD-10-CM | POA: Insufficient documentation

## 2013-03-12 LAB — BASIC METABOLIC PANEL
BUN: 57 mg/dL — ABNORMAL HIGH (ref 6–23)
CO2: 23 mEq/L (ref 19–32)
Chloride: 103 mEq/L (ref 96–112)
Creatinine, Ser: 2.95 mg/dL — ABNORMAL HIGH (ref 0.50–1.35)
GFR calc non Af Amer: 18 mL/min — ABNORMAL LOW (ref >90)

## 2013-03-12 LAB — CBC WITH DIFFERENTIAL/PLATELET
Basophils Absolute: 0 10*3/uL (ref 0.0–0.1)
Basophils Relative: 0 % (ref 0–1)
Eosinophils Relative: 3 % (ref 0–5)
HCT: 30.9 % — ABNORMAL LOW (ref 39.0–52.0)
Hemoglobin: 9.9 g/dL — ABNORMAL LOW (ref 13.0–17.0)
Lymphocytes Relative: 27 % (ref 12–46)
MCHC: 32 g/dL (ref 30.0–36.0)
MCV: 95.1 fL (ref 78.0–100.0)
Monocytes Absolute: 0.3 10*3/uL (ref 0.1–1.0)
Monocytes Relative: 6 % (ref 3–12)
Platelets: 128 10*3/uL — ABNORMAL LOW (ref 150–400)
RDW: 15 % (ref 11.5–15.5)
WBC: 5.3 10*3/uL (ref 4.0–10.5)

## 2013-03-12 MED ORDER — FENTANYL CITRATE 0.05 MG/ML IJ SOLN
INTRAMUSCULAR | Status: AC
  Filled 2013-03-12: qty 4

## 2013-03-12 MED ORDER — MIDAZOLAM HCL 2 MG/2ML IJ SOLN
INTRAMUSCULAR | Status: AC | PRN
  Administered 2013-03-12 (×2): 1 mg via INTRAVENOUS

## 2013-03-12 MED ORDER — FENTANYL CITRATE 0.05 MG/ML IJ SOLN
INTRAMUSCULAR | Status: AC | PRN
  Administered 2013-03-12: 100 ug via INTRAVENOUS

## 2013-03-12 MED ORDER — IOHEXOL 300 MG/ML  SOLN
150.0000 mL | Freq: Once | INTRAMUSCULAR | Status: AC | PRN
Start: 2013-03-12 — End: 2013-03-12
  Administered 2013-03-12: 30 mL

## 2013-03-12 MED ORDER — LIDOCAINE HCL 1 % IJ SOLN
INTRAMUSCULAR | Status: AC
  Filled 2013-03-12: qty 20

## 2013-03-12 MED ORDER — CIPROFLOXACIN IN D5W 400 MG/200ML IV SOLN
400.0000 mg | Freq: Once | INTRAVENOUS | Status: AC
  Administered 2013-03-12: 400 mg via INTRAVENOUS
  Filled 2013-03-12: qty 200

## 2013-03-12 MED ORDER — MIDAZOLAM HCL 2 MG/2ML IJ SOLN
INTRAMUSCULAR | Status: AC
  Filled 2013-03-12: qty 4

## 2013-03-12 MED ORDER — SODIUM CHLORIDE 0.9 % IV SOLN
INTRAVENOUS | Status: DC
  Administered 2013-03-12: 08:00:00 via INTRAVENOUS

## 2013-03-12 NOTE — Progress Notes (Signed)
Called Dr Margrett Rud office about getting home care for patient as he lives alone and cannot see right flank dressing in order to do wound care.

## 2013-03-12 NOTE — Procedures (Signed)
Failed ureteral stent. PCN exchange No comp

## 2013-03-12 NOTE — H&P (Signed)
[name] is an 79 y.o. male.   Chief Complaint: "I'm here to get a stent in my kidney" HPI: Patient with history of chronic bilateral hydronephrosis/UPJ stones, renal failure, tortuous right ureter and placement of right PCN 02/14/13 presents today for attempted insertion of a right ureteral stent.  Past Medical History  Diagnosis Date  . Hyperlipidemia   . White coat hypertension   . Cardiomyopathy      EF:30-40% in 06/1998, but 50-55% in 2009;  HX ISCHEMIC CARDIOMYOPATHY  . Chronic atrial fibrillation   . Vitamin B12 deficiency   . Chronic anticoagulation   . Arthritis   . Sensory neuropathy   . Chronic kidney disease, stage III (moderate)     creatinine 1.5 2001, 2.0 2009, 2.28 in 12/2009  . Mild renal insufficiency   . Hydronephrosis, bilateral   . Ureteral calculi     BILATERAL  . History of CHF (congestive heart failure)     1999  &  2005  . History of kidney stones   . Bilateral renal cysts   . 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 Chronic venous insufficiency   . PAD (peripheral artery disease)   . Edema of both legs   . History of gout     per pt stable  . Glaucoma of both eyes   . Wears hearing aid     bilateral    Past Surgical History  Procedure Laterality Date  . Repair thoracic aorta  01/1995    s/p rupture  . Cataract extraction w/ intraocular lens  implant, bilateral  2012  . 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 Laparoscopic cholecystectomy  JAN 2005  . Percutaneous nephrostolithotomy  1970's  . Abdominal aortagram  07-16-2003  DR ROTHBART    W/ COIL EMBOLIZATION OF SIX BRANCHES OF RIGHT INTERNAL RENAL ARTERY ANEURYSM  . Cardiovascular stress test  04-25-2003    LOW RISK CARDIOLITE STUDY/ MODERATE LV DILATATION AND IMPAIRMENT OF LVSF/  NO ISCHEMIA  . Transthoracic echocardiogram   02-07-2008  DR ROTHBART    MODERATE DILATED LV/  EF 50-55%/ MILD AR/ MILD TO MODERATE AORTIC ROOT DILATATION/ MODERATE MR/ MODERATE LA   &  RA   DIILATED/ MILD TO MODERATE TR  . Cystoscopy w/ ureteral stent placement Bilateral 10/24/2012    Procedure: CYSTOSCOPY WITH RETROGRADE PYELOGRAM/URETERAL STENT PLACEMENT;  Surgeon: Lindaann Slough, MD;  Location: [facility];  Service: Urology;  Laterality: Bilateral;  . Cystoscopy with ureteroscopy Left 11/27/2012    Procedure: CYSTOSCOPY,LEFT URETEROSCOPY WITH LASER LITHOTRIPSY/REMOVAL OF MIGRATED STENT, PLACEMENT OF LEFT STENT;  Surgeon: Garnett Farm, MD;  Location: [facility];  Service: Urology;  Laterality: Left;  . Holmium laser application Left 11/27/2012    Procedure: HOLMIUM LASER APPLICATION;  Surgeon: Garnett Farm, MD;  Location: [facility];  Service: Urology;  Laterality: Left;  . Cystoscopy with ureteroscopy and stent placement N/A 12/29/2012    Procedure: CYSTOSCOPY WITH URETEROSCOPY AND STENT PLACEMENT, removal of right and left stents, retrogrades, right stent exchange;  Surgeon: Garnett Farm, MD;  Location: [facility];  Service: Urology;  Laterality: N/A;  . Holmium laser application Right 12/29/2012    Procedure: HOLMIUM LASER APPLICATION;  Surgeon: Garnett Farm, MD;  Location: [facility];  Service: Urology;  Laterality: Right;  HLL OF (RT) URETERAL PELVIC JUNCTION STONE  . Cystoscopy with retrograde pyelogram, ureteroscopy and stent placement Right 01/29/2013    Procedure: CYSTOSCOPY WITH RIGHT URETEROSCOPY AND STENT PLACEMENT;  Surgeon: Garnett Farm, MD;  Location: [facility];  Service: Urology;  Laterality: Right;  . Holmium laser application N/A 01/29/2013    Procedure: HOLMIUM LASER APPLICATION;  Surgeon: Garnett Farm, MD;  Location: [facility];  Service: Urology;  Laterality: N/A;  . Esophagogastroduodenoscopy N/A 02/16/2013    Procedure: ESOPHAGOGASTRODUODENOSCOPY (EGD);  Surgeon: Meryl Dare, MD;  Location: [facility];  Service:  Endoscopy;  Laterality: N/A;    Family History  Problem Relation Age of Onset  . Heart disease Mother   . Early death Father   . Aneurysm Brother   . Aortic aneurysm Brother   . Diabetes Sister    Social History:  reports that he has never smoked. He has never used smokeless tobacco. He reports that he does not drink alcohol or use illicit drugs.  Allergies:  Allergies  Allergen Reactions  . Sulfa Antibiotics Rash    Nausea     Current outpatient prescriptions:amLODipine (NORVASC) 5 MG tablet, Take 5 mg by mouth every morning. , Disp: , Rfl: ;  Cyanocobalamin (VITAMIN B-12) 2000 MCG TBCR, Take 2,000 mcg by mouth daily. , Disp: , Rfl: ;  digoxin (DIGOX) 0.125 MG tablet, Take 0.125 mg by mouth every morning. Take 1 tablet by mouth Monday through Friday., Disp: , Rfl: ;  doxazosin (CARDURA) 4 MG tablet, Take 4 mg by mouth at bedtime., Disp: , Rfl:  furosemide (LASIX) 40 MG tablet, Take 40 mg by mouth every morning. , Disp: , Rfl: ;  glucosamine-chondroitin 500-400 MG tablet, Take 2 tablets by mouth daily. , Disp: , Rfl: ;  latanoprost (XALATAN) 0.005 % ophthalmic solution, Place 1 drop into both eyes at bedtime. , Disp: , Rfl: ;  metoprolol (LOPRESSOR) 100 MG tablet, Take 100 mg by mouth 2 (two) times daily., Disp: , Rfl:  simvastatin (ZOCOR) 20 MG tablet, Take 20 mg by mouth daily. , Disp: , Rfl: ;  acetaminophen (TYLENOL) 500 MG tablet, Take 1,000 mg by mouth every 6 (six) hours as needed for mild pain, moderate pain or headache. , Disp: , Rfl: ;  ondansetron (ZOFRAN-ODT) 4 MG disintegrating tablet, Take 4 mg by mouth every 8 (eight) hours as needed for nausea., Disp: , Rfl:  Current facility-administered medications:0.9 %  sodium chloride infusion, , Intravenous, Continuous, Brayton El, PA-C, Last Rate: 20 mL/hr at 03/12/13 0825;  ciprofloxacin (CIPRO) IVPB 400 mg, 400 mg, Intravenous, Once, Brayton El, PA-C   Results for orders placed during the hospital encounter of 03/12/13 (from  the past 48 hour(s))  APTT     Status: None   Collection Time    03/12/13  8:25 AM      Result Value Range   aPTT 37  24 - 37 seconds   Comment:            IF BASELINE aPTT IS ELEVATED,     SUGGEST PATIENT RISK ASSESSMENT     BE USED TO DETERMINE APPROPRIATE     ANTICOAGULANT THERAPY.  BASIC METABOLIC PANEL     Status: Abnormal   Collection Time    03/12/13  8:25 AM      Result Value Range   Sodium 138  135 - 145 mEq/L   Potassium 5.3 (*) 3.5 - 5.1 mEq/L   Chloride 103  96 - 112 mEq/L  CO2 23  19 - 32 mEq/L   Glucose, Bld 96  70 - 99 mg/dL   BUN 57 (*) 6 - 23 mg/dL   Creatinine, Ser 1.32 (*) 0.50 - 1.35 mg/dL   Calcium 9.3  8.4 - 44.0 mg/dL   GFR calc non Af Amer 18 (*) >90 mL/min   GFR calc Af Amer 21 (*) >90 mL/min   Comment: (NOTE)     The eGFR has been calculated using the CKD EPI equation.     This calculation has not been validated in all clinical situations.     eGFR's persistently <90 mL/min signify possible Chronic Kidney     Disease.  CBC WITH DIFFERENTIAL     Status: Abnormal   Collection Time    03/12/13  8:25 AM      Result Value Range   WBC 5.3  4.0 - 10.5 K/uL   RBC 3.25 (*) 4.22 - 5.81 MIL/uL   Hemoglobin 9.9 (*) 13.0 - 17.0 g/dL   HCT 10.2 (*) 72.5 - 36.6 %   MCV 95.1  78.0 - 100.0 fL   MCH 30.5  26.0 - 34.0 pg   MCHC 32.0  30.0 - 36.0 g/dL   RDW 44.0  34.7 - 42.5 %   Platelets 128 (*) 150 - 400 K/uL   Neutrophils Relative % 64  43 - 77 %   Neutro Abs 3.4  1.7 - 7.7 K/uL   Lymphocytes Relative 27  12 - 46 %   Lymphs Abs 1.4  0.7 - 4.0 K/uL   Monocytes Relative 6  3 - 12 %   Monocytes Absolute 0.3  0.1 - 1.0 K/uL   Eosinophils Relative 3  0 - 5 %   Eosinophils Absolute 0.1  0.0 - 0.7 K/uL   Basophils Relative 0  0 - 1 %   Basophils Absolute 0.0  0.0 - 0.1 K/uL  PROTIME-INR     Status: None   Collection Time    03/12/13  8:25 AM      Result Value Range   Prothrombin Time 14.5  11.6 - 15.2 seconds   INR 1.15  0.00 - 1.49   No results  found.  Review of Systems  Constitutional: Negative for fever and chills.  Respiratory: Negative for cough and shortness of breath.   Cardiovascular: Negative for chest pain.  Gastrointestinal: Negative for nausea, vomiting and abdominal pain.  Genitourinary: Negative for dysuria, hematuria and flank pain.  Musculoskeletal: Negative for back pain.  Neurological: Negative for headaches.  Endo/Heme/Allergies: Does not bruise/bleed easily.    Blood pressure 106/67, pulse 106, temperature 97.1 F (36.2 C), temperature source Oral, resp. rate 18, SpO2 99.00%. Physical Exam  Constitutional: He is oriented to person, place, and time. He appears well-developed and well-nourished.  Cardiovascular:  irreg irregular  Respiratory: Effort normal and breath sounds normal.  GI: Soft. Bowel sounds are normal. There is no tenderness.  Genitourinary:  Intact rt PCN draining golden yellow urine  Musculoskeletal: Normal range of motion. He exhibits edema.  Neurological: He is alert and oriented to person, place, and time.     Assessment/Plan Pt with hx of chronic bilateral hydronephrosis/UPJ stones, renal failure, tortuous rt ureter and recent rt PCN. Plan is for attempt at right ureteral stent placement today. Details/risks of procedure d/w pt with his understanding and consent.  Kimie Pidcock,D KEVIN 03/12/2013, 9:07 AM

## 2013-04-02 ENCOUNTER — Other Ambulatory Visit: Payer: Self-pay | Admitting: *Deleted

## 2013-04-02 ENCOUNTER — Telehealth: Payer: Self-pay | Admitting: *Deleted

## 2013-04-02 MED ORDER — ZOLPIDEM TARTRATE 5 MG PO TABS: 5.0000 mg | ORAL_TABLET | Freq: Every evening | ORAL | Status: DC | PRN

## 2013-04-02 NOTE — Telephone Encounter (Signed)
Notified [facility] and faxed in med.

## 2013-04-02 NOTE — Telephone Encounter (Signed)
Spoke with [name] from [facility]. She stated that [name] has been experiencing difficulty with sleep. He has tried Benadryl and Unisom. He recently got a urostomy tube placed on 03/12/13, and believes that is the cause. Can we call in anything? I have his chart for you if you need it.

## 2013-04-02 NOTE — Telephone Encounter (Signed)
ambien 5 one qhs prn sleep thirty two ref

## 2013-04-03 ENCOUNTER — Telehealth: Payer: Self-pay | Admitting: *Deleted

## 2013-04-03 ENCOUNTER — Encounter (HOSPITAL_COMMUNITY): Payer: Self-pay | Admitting: Pharmacy Technician

## 2013-04-03 NOTE — Telephone Encounter (Signed)
Pt states that he has fluid gain in ankles and has been out SOB. He is scheduled to have surgery Monday to reposition the kidney drain. He has upcoming appt with Dr Wyline Mood on 04/11/13.  Pt has been off coumadin for months because of everything going on with kidneys, He is wondering if this would be why he is SOB.

## 2013-04-04 NOTE — Telephone Encounter (Signed)
Spoke to pt to advise results/instructions. Pt thought his kidney function has improved, advised pt labs noted from 03-12-13 advise other wise, gave pt numbers and ranges, pt then stated the home health nurse called for him and he really was not all that concerned with everything and that he swells in the ankles for the past 50 years and that he has an apt with the one of "those doc's you said" on Monday, advised pt to contact his urologist and nephrologist prior to Monday to address this concern, pt understood and will contact MD

## 2013-04-04 NOTE — Telephone Encounter (Signed)
Increased fluid likely due to blocked kidney that they are going to stent. This can be addressed by urology when he sees them today.   Dina Rich MD

## 2013-04-04 NOTE — Telephone Encounter (Signed)
Pt states he does not have a blocked kidney and he canceled the urologist appt today and is going Monday .Please advise

## 2013-04-04 NOTE — Telephone Encounter (Signed)
From Dr Langston Masker notes, he has no history of heart failure or cardiac reason to be retaining fluids. His labs show very poor kidney function, if he is having worsening swelling its likely due to his worsening kidney function. He needs to get in touch with either his neprhologist or his urologist.    Dina Rich MD

## 2013-04-05 ENCOUNTER — Other Ambulatory Visit: Payer: Self-pay | Admitting: Radiology

## 2013-04-05 ENCOUNTER — Telehealth (HOSPITAL_COMMUNITY): Payer: Self-pay | Admitting: Radiology

## 2013-04-05 NOTE — Telephone Encounter (Signed)
Called patient to confirm IR appointment Monday 04-09-13 to attempt to internalize right nephrostomy.  NPO after mdnt, have driver, arrive at [phone].

## 2013-04-09 ENCOUNTER — Ambulatory Visit (HOSPITAL_COMMUNITY)
Admission: RE | Admit: 2013-04-09 | Discharge: 2013-04-09 | Disposition: A | Discharge status: Home / Self Care | Payer: Medicare Other | Source: Ambulatory Visit | Attending: Interventional Radiology | Admitting: Interventional Radiology

## 2013-04-09 ENCOUNTER — Encounter (HOSPITAL_COMMUNITY): Payer: Self-pay

## 2013-04-09 DIAGNOSIS — E785 Hyperlipidemia, unspecified: Secondary | ICD-10-CM | POA: Insufficient documentation

## 2013-04-09 DIAGNOSIS — Z87442 Personal history of urinary calculi: Secondary | ICD-10-CM | POA: Insufficient documentation

## 2013-04-09 DIAGNOSIS — N2889 Other specified disorders of kidney and ureter: Secondary | ICD-10-CM

## 2013-04-09 DIAGNOSIS — I4891 Unspecified atrial fibrillation: Secondary | ICD-10-CM | POA: Insufficient documentation

## 2013-04-09 DIAGNOSIS — I739 Peripheral vascular disease, unspecified: Secondary | ICD-10-CM | POA: Insufficient documentation

## 2013-04-09 DIAGNOSIS — I714 Abdominal aortic aneurysm, without rupture, unspecified: Secondary | ICD-10-CM | POA: Insufficient documentation

## 2013-04-09 DIAGNOSIS — N135 Crossing vessel and stricture of ureter without hydronephrosis: Secondary | ICD-10-CM | POA: Insufficient documentation

## 2013-04-09 DIAGNOSIS — N1330 Unspecified hydronephrosis: Secondary | ICD-10-CM | POA: Insufficient documentation

## 2013-04-09 DIAGNOSIS — Z79899 Other long term (current) drug therapy: Secondary | ICD-10-CM | POA: Insufficient documentation

## 2013-04-09 DIAGNOSIS — I428 Other cardiomyopathies: Secondary | ICD-10-CM | POA: Insufficient documentation

## 2013-04-09 DIAGNOSIS — H409 Unspecified glaucoma: Secondary | ICD-10-CM | POA: Insufficient documentation

## 2013-04-09 DIAGNOSIS — N183 Chronic kidney disease, stage 3 unspecified: Secondary | ICD-10-CM | POA: Insufficient documentation

## 2013-04-09 LAB — CBC WITH DIFFERENTIAL/PLATELET
Basophils Relative: 0 % (ref 0–1)
Eosinophils Relative: 2 % (ref 0–5)
HCT: 36.5 % — ABNORMAL LOW (ref 39.0–52.0)
Hemoglobin: 12 g/dL — ABNORMAL LOW (ref 13.0–17.0)
Lymphocytes Relative: 18 % (ref 12–46)
Lymphs Abs: 1.4 10*3/uL (ref 0.7–4.0)
MCH: 32.1 pg (ref 26.0–34.0)
MCHC: 32.9 g/dL (ref 30.0–36.0)
Monocytes Absolute: 0.8 10*3/uL (ref 0.1–1.0)
Monocytes Relative: 10 % (ref 3–12)
Neutro Abs: 5.5 10*3/uL (ref 1.7–7.7)
Neutrophils Relative %: 71 % (ref 43–77)
RBC: 3.74 MIL/uL — ABNORMAL LOW (ref 4.22–5.81)

## 2013-04-09 LAB — PROTIME-INR: INR: 1.17 (ref 0.00–1.49)

## 2013-04-09 LAB — BASIC METABOLIC PANEL
GFR calc non Af Amer: 23 mL/min — ABNORMAL LOW (ref >90)
Glucose, Bld: 95 mg/dL (ref 70–99)
Potassium: 4.3 mEq/L (ref 3.5–5.1)
Sodium: 139 mEq/L (ref 135–145)

## 2013-04-09 MED ORDER — MIDAZOLAM HCL 2 MG/2ML IJ SOLN
INTRAMUSCULAR | Status: AC | PRN
  Administered 2013-04-09: 1 mg via INTRAVENOUS

## 2013-04-09 MED ORDER — SODIUM CHLORIDE 0.9 % IV SOLN
INTRAVENOUS | Status: DC
  Administered 2013-04-09: 08:00:00 via INTRAVENOUS

## 2013-04-09 MED ORDER — CIPROFLOXACIN IN D5W 400 MG/200ML IV SOLN
400.0000 mg | Freq: Once | INTRAVENOUS | Status: AC
  Administered 2013-04-09: 400 mg via INTRAVENOUS

## 2013-04-09 MED ORDER — IOHEXOL 300 MG/ML  SOLN: 20.0000 mL | Freq: Once | INTRAMUSCULAR | Status: DC | PRN

## 2013-04-09 MED ORDER — CIPROFLOXACIN IN D5W 400 MG/200ML IV SOLN
INTRAVENOUS | Status: AC
  Filled 2013-04-09: qty 200

## 2013-04-09 MED ORDER — FENTANYL CITRATE 0.05 MG/ML IJ SOLN
INTRAMUSCULAR | Status: AC
  Filled 2013-04-09: qty 6

## 2013-04-09 MED ORDER — MIDAZOLAM HCL 2 MG/2ML IJ SOLN
INTRAMUSCULAR | Status: AC
  Filled 2013-04-09: qty 6

## 2013-04-09 MED ORDER — FENTANYL CITRATE 0.05 MG/ML IJ SOLN
INTRAMUSCULAR | Status: AC | PRN
  Administered 2013-04-09: 50 ug via INTRAVENOUS

## 2013-04-09 NOTE — H&P (Signed)
[name] is an 79 y.o. male.   Chief Complaint: "I'm here again to try and get stent in my kidney" HPI: Patient with history of chronic bilateral hydronephrosis/UPJ stones, renal failure, tortuous right ureter/mid ureteral obstruction and placement of right PCN 02/14/13 presents today for reattempt at placement of a right ureteral stent.  Past Medical History  Diagnosis Date  . Hyperlipidemia   . White coat hypertension   . Cardiomyopathy      EF:30-40% in 06/1998, but 50-55% in 2009;  HX ISCHEMIC CARDIOMYOPATHY  . Chronic atrial fibrillation   . Vitamin B12 deficiency   . Chronic anticoagulation   . Arthritis   . Sensory neuropathy   . Chronic kidney disease, stage III (moderate)     creatinine 1.5 2001, 2.0 2009, 2.28 in 12/2009  . Mild renal insufficiency   . Hydronephrosis, bilateral   . Ureteral calculi     BILATERAL  . History of CHF (congestive heart failure)     1999  &  2005  . History of kidney stones   . Bilateral renal cysts   . 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 Chronic venous insufficiency   . PAD (peripheral artery disease)   . Edema of both legs   . History of gout     per pt stable  . Glaucoma of both eyes   . Wears hearing aid     bilateral    Past Surgical History  Procedure Laterality Date  . Repair thoracic aorta  01/1995    s/p rupture  . Cataract extraction w/ intraocular lens  implant, bilateral  2012  . 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 Laparoscopic cholecystectomy  JAN 2005  . Percutaneous nephrostolithotomy  1970's  . Abdominal aortagram  07-16-2003  DR ROTHBART    W/ COIL EMBOLIZATION OF SIX BRANCHES OF RIGHT INTERNAL RENAL ARTERY ANEURYSM  . Cardiovascular stress test  04-25-2003    LOW RISK CARDIOLITE STUDY/ MODERATE LV DILATATION AND IMPAIRMENT OF LVSF/  NO  ISCHEMIA  . Transthoracic echocardiogram  02-07-2008  DR ROTHBART    MODERATE DILATED LV/  EF 50-55%/ MILD AR/ MILD TO MODERATE AORTIC ROOT DILATATION/ MODERATE MR/ MODERATE LA   &  RA   DIILATED/ MILD TO MODERATE TR  . Cystoscopy w/ ureteral stent placement Bilateral 10/24/2012    Procedure: CYSTOSCOPY WITH RETROGRADE PYELOGRAM/URETERAL STENT PLACEMENT;  Surgeon: Lindaann Slough, MD;  Location: [facility];  Service: Urology;  Laterality: Bilateral;  . Cystoscopy with ureteroscopy Left 11/27/2012    Procedure: CYSTOSCOPY,LEFT URETEROSCOPY WITH LASER LITHOTRIPSY/REMOVAL OF MIGRATED STENT, PLACEMENT OF LEFT STENT;  Surgeon: Garnett Farm, MD;  Location: [facility];  Service: Urology;  Laterality: Left;  . Holmium laser application Left 11/27/2012    Procedure: HOLMIUM LASER APPLICATION;  Surgeon: Garnett Farm, MD;  Location: [facility];  Service: Urology;  Laterality: Left;  . Cystoscopy with ureteroscopy and stent placement N/A 12/29/2012    Procedure: CYSTOSCOPY WITH URETEROSCOPY AND STENT PLACEMENT, removal of right and left stents, retrogrades, right stent exchange;  Surgeon: Garnett Farm, MD;  Location: [facility];  Service: Urology;  Laterality: N/A;  . Holmium laser application Right 12/29/2012    Procedure: HOLMIUM LASER APPLICATION;  Surgeon: Garnett Farm, MD;  Location: [facility];  Service:  Urology;  Laterality: Right;  HLL OF (RT) URETERAL PELVIC JUNCTION STONE  . Cystoscopy with retrograde pyelogram, ureteroscopy and stent placement Right 01/29/2013    Procedure: CYSTOSCOPY WITH RIGHT URETEROSCOPY AND STENT PLACEMENT;  Surgeon: Garnett Farm, MD;  Location: [facility];  Service: Urology;  Laterality: Right;  . Holmium laser application N/A 01/29/2013    Procedure: HOLMIUM LASER APPLICATION;  Surgeon: Garnett Farm, MD;  Location: [facility];  Service: Urology;  Laterality: N/A;  . Esophagogastroduodenoscopy N/A 02/16/2013    Procedure: ESOPHAGOGASTRODUODENOSCOPY (EGD);  Surgeon: Meryl Dare,  MD;  Location: [facility];  Service: Endoscopy;  Laterality: N/A;    Family History  Problem Relation Age of Onset  . Heart disease Mother   . Early death Father   . Aneurysm Brother   . Aortic aneurysm Brother   . Diabetes Sister    Social History:  reports that he has never smoked. He has never used smokeless tobacco. He reports that he does not drink alcohol or use illicit drugs.  Allergies:  Allergies  Allergen Reactions  . Sulfa Antibiotics Rash    Nausea     Current outpatient prescriptions:acetaminophen (TYLENOL) 500 MG tablet, Take 1,000 mg by mouth every 6 (six) hours as needed for mild pain, moderate pain or headache. , Disp: , Rfl: ;  amLODipine (NORVASC) 5 MG tablet, Take 5 mg by mouth every morning. , Disp: , Rfl: ;  Cyanocobalamin (VITAMIN B-12) 2000 MCG TBCR, Take 2,000 mcg by mouth daily. , Disp: , Rfl:  digoxin (DIGOX) 0.125 MG tablet, Take 0.125 mg by mouth every morning. Take 1 tablet by mouth Monday through Friday., Disp: , Rfl: ;  doxazosin (CARDURA) 4 MG tablet, Take 4 mg by mouth at bedtime., Disp: , Rfl: ;  furosemide (LASIX) 40 MG tablet, Take 40 mg by mouth every morning. , Disp: , Rfl: ;  glucosamine-chondroitin 500-400 MG tablet, Take 2 tablets by mouth daily. , Disp: , Rfl:  latanoprost (XALATAN) 0.005 % ophthalmic solution, Place 1 drop into both eyes at bedtime. , Disp: , Rfl: ;  metoprolol (LOPRESSOR) 100 MG tablet, Take 100 mg by mouth 2 (two) times daily., Disp: , Rfl: ;  simvastatin (ZOCOR) 20 MG tablet, Take 20 mg by mouth daily with breakfast. , Disp: , Rfl: ;  zolpidem (AMBIEN) 5 MG tablet, Take 5 mg by mouth at bedtime as needed for sleep., Disp: , Rfl:  Current facility-administered medications:0.9 %  sodium chloride infusion, , Intravenous, Continuous, Brayton El, PA-C, Last Rate: 50 mL/hr at 04/09/13 1610;  ciprofloxacin (CIPRO) IVPB 400 mg, 400 mg, Intravenous, Once, Brayton El, PA-C   Results for orders placed during the hospital  encounter of 04/09/13 (from the past 48 hour(s))  APTT     Status: None   Collection Time    04/09/13  7:30 AM      Result Value Range   aPTT 36  24 - 37 seconds  BASIC METABOLIC PANEL     Status: Abnormal   Collection Time    04/09/13  7:30 AM      Result Value Range   Sodium 139  135 - 145 mEq/L   Potassium 4.3  3.5 - 5.1 mEq/L   Chloride 104  96 - 112 mEq/L   CO2 23  19 - 32 mEq/L   Glucose, Bld 95  70 - 99 mg/dL   BUN 36 (*) 6 - 23 mg/dL   Creatinine, Ser 9.60 (*) 0.50 - 1.35 mg/dL  Calcium 8.8  8.4 - 10.5 mg/dL   GFR calc non Af Amer 23 (*) >90 mL/min   GFR calc Af Amer 27 (*) >90 mL/min   Comment: (NOTE)     The eGFR has been calculated using the CKD EPI equation.     This calculation has not been validated in all clinical situations.     eGFR's persistently <90 mL/min signify possible Chronic Kidney     Disease.  CBC WITH DIFFERENTIAL     Status: Abnormal   Collection Time    04/09/13  7:30 AM      Result Value Range   WBC 7.7  4.0 - 10.5 K/uL   RBC 3.74 (*) 4.22 - 5.81 MIL/uL   Hemoglobin 12.0 (*) 13.0 - 17.0 g/dL   HCT 09.8 (*) 11.9 - 14.7 %   MCV 97.6  78.0 - 100.0 fL   MCH 32.1  26.0 - 34.0 pg   MCHC 32.9  30.0 - 36.0 g/dL   RDW 82.9 (*) 56.2 - 13.0 %   Platelets 132 (*) 150 - 400 K/uL   Neutrophils Relative % 71  43 - 77 %   Neutro Abs 5.5  1.7 - 7.7 K/uL   Lymphocytes Relative 18  12 - 46 %   Lymphs Abs 1.4  0.7 - 4.0 K/uL   Monocytes Relative 10  3 - 12 %   Monocytes Absolute 0.8  0.1 - 1.0 K/uL   Eosinophils Relative 2  0 - 5 %   Eosinophils Absolute 0.1  0.0 - 0.7 K/uL   Basophils Relative 0  0 - 1 %   Basophils Absolute 0.0  0.0 - 0.1 K/uL  PROTIME-INR     Status: None   Collection Time    04/09/13  7:30 AM      Result Value Range   Prothrombin Time 14.7  11.6 - 15.2 seconds   INR 1.17  0.00 - 1.49   No results found.  Review of Systems  Constitutional: Negative for fever and chills.  Respiratory: Negative for cough.        Occ dyspnea  with exertion  Cardiovascular: Positive for leg swelling. Negative for chest pain.  Gastrointestinal: Negative for nausea, vomiting and abdominal pain.  Genitourinary: Negative for dysuria and hematuria.       Occ mild rt flank discomfort  Neurological: Negative for headaches.  Endo/Heme/Allergies: Does not bruise/bleed easily.    Blood pressure 113/73, pulse 83, temperature 97.3 F (36.3 C), temperature source Oral, resp. rate 18, SpO2 100.00%. Physical Exam  Constitutional: He is oriented to person, place, and time. He appears well-developed and well-nourished.  Cardiovascular:  irreg irreg  Respiratory: Effort normal and breath sounds normal.  GI: Soft. Bowel sounds are normal.  Genitourinary:  Rt PCN intact, draining yellow urine  Musculoskeletal: Normal range of motion. He exhibits edema.  Neurological: He is alert and oriented to person, place, and time.     Assessment/Plan Patient with history of chronic bilateral hydronephrosis/UPJ stones, renal failure, tortuous right ureter/mid ureteral obstruction and placement of right PCN 02/14/13 presents today for reattempt at placement of a right ureteral stent. Details/risks of procedure d/w pt with his understanding and consent.  ALLRED,D KEVIN 04/09/2013, 8:59 AM

## 2013-04-09 NOTE — Procedures (Signed)
Attempted antegrade ureteral stent placement, unsuccessful.  Replaced right nephrostomy tube with fluoroscopy.  No immediate complication.

## 2013-04-11 ENCOUNTER — Ambulatory Visit (INDEPENDENT_AMBULATORY_CARE_PROVIDER_SITE_OTHER): Payer: Medicare Other | Admitting: Cardiology

## 2013-04-11 ENCOUNTER — Encounter: Payer: Self-pay | Admitting: Cardiology

## 2013-04-11 VITALS — BP 115/71 | HR 61 | Ht 70.0 in | Wt 194.0 lb

## 2013-04-11 DIAGNOSIS — I4891 Unspecified atrial fibrillation: Secondary | ICD-10-CM

## 2013-04-11 NOTE — Patient Instructions (Addendum)
Your physician recommends that you schedule a follow-up appointment in: MONTH

## 2013-04-11 NOTE — Progress Notes (Signed)
Clinical Summary [name] is a 79 y.o.male former patient of Dr Dietrich Pates, this is our first visit together. He was seen for the following medical problems.  1. Afib - denies any palpitations - he stopped taking his coumadin because of multiple urological problems   2. Cardiomyopathy - prior LVEF 30-40% in 06/1998, LVEF normalized in 2009 LVEF 50-55% - worsening LE edema over the last few weeks. No SOB, no orthopnea, no PND  3. CKD - recurrent issues with kidney stones and hydronephrosis, followed by urology.  - 04/09/13 unsuccesful stent placement, replaced right nephrostomy tube  4. Abdominal aortic aneurysm - followed by Dr Edilia Bo vascular  Past Medical History  Diagnosis Date  . Hyperlipidemia   . White coat hypertension   . Cardiomyopathy      EF:30-40% in 06/1998, but 50-55% in 2009;  HX ISCHEMIC CARDIOMYOPATHY  . Chronic atrial fibrillation   . Vitamin B12 deficiency   . Chronic anticoagulation   . Arthritis   . Sensory neuropathy   . Chronic kidney disease, stage III (moderate)     creatinine 1.5 2001, 2.0 2009, 2.28 in 12/2009  . Mild renal insufficiency   . Hydronephrosis, bilateral   . Ureteral calculi     BILATERAL  . History of CHF (congestive heart failure)     1999  &  2005  . History of kidney stones   . Bilateral renal cysts   . 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 Chronic venous insufficiency   . PAD (peripheral artery disease)   . Edema of both legs   . History of gout     per pt stable  . Glaucoma of both eyes   . Wears hearing aid     bilateral     Allergies  Allergen Reactions  . Sulfa Antibiotics Rash    Nausea      Current Outpatient Prescriptions  Medication Sig Dispense Refill  . acetaminophen (TYLENOL) 500 MG tablet Take 1,000 mg by mouth every 6 (six) hours as needed for mild pain, moderate pain or  headache.       Marland Kitchen amLODipine (NORVASC) 5 MG tablet Take 5 mg by mouth every morning.       . Cyanocobalamin (VITAMIN B-12) 2000 MCG TBCR Take 2,000 mcg by mouth daily.       . digoxin (DIGOX) 0.125 MG tablet Take 0.125 mg by mouth every morning. Take 1 tablet by mouth Monday through Friday.      . doxazosin (CARDURA) 4 MG tablet Take 4 mg by mouth at bedtime.      . furosemide (LASIX) 40 MG tablet Take 40 mg by mouth every morning.       Marland Kitchen glucosamine-chondroitin 500-400 MG tablet Take 2 tablets by mouth daily.       Marland Kitchen latanoprost (XALATAN) 0.005 % ophthalmic solution Place 1 drop into both eyes at bedtime.       . metoprolol (LOPRESSOR) 100 MG tablet Take 100 mg by mouth 2 (two) times daily.      . simvastatin (ZOCOR) 20 MG tablet Take 20 mg by mouth daily with breakfast.       . warfarin (COUMADIN) 2.5 MG tablet Take 2.5 mg by mouth daily.      Marland Kitchen zolpidem (AMBIEN) 5 MG tablet Take 5 mg by mouth at bedtime as  needed for sleep.       No current facility-administered medications for this visit.     Past Surgical History  Procedure Laterality Date  . Repair thoracic aorta  01/1995    s/p rupture  . Cataract extraction w/ intraocular lens  implant, bilateral  2012  . 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 Laparoscopic cholecystectomy  JAN 2005  . Percutaneous nephrostolithotomy  1970's  . Abdominal aortagram  07-16-2003  DR ROTHBART    W/ COIL EMBOLIZATION OF SIX BRANCHES OF RIGHT INTERNAL RENAL ARTERY ANEURYSM  . Cardiovascular stress test  04-25-2003    LOW RISK CARDIOLITE STUDY/ MODERATE LV DILATATION AND IMPAIRMENT OF LVSF/  NO ISCHEMIA  . Transthoracic echocardiogram  02-07-2008  DR ROTHBART    MODERATE DILATED LV/  EF 50-55%/ MILD AR/ MILD TO MODERATE AORTIC ROOT DILATATION/ MODERATE MR/ MODERATE LA   &  RA   DIILATED/ MILD TO MODERATE TR  . Cystoscopy w/ ureteral stent placement Bilateral 10/24/2012    Procedure: CYSTOSCOPY WITH  RETROGRADE PYELOGRAM/URETERAL STENT PLACEMENT;  Surgeon: Lindaann Slough, MD;  Location: [facility];  Service: Urology;  Laterality: Bilateral;  . Cystoscopy with ureteroscopy Left 11/27/2012    Procedure: CYSTOSCOPY,LEFT URETEROSCOPY WITH LASER LITHOTRIPSY/REMOVAL OF MIGRATED STENT, PLACEMENT OF LEFT STENT;  Surgeon: Garnett Farm, MD;  Location: [facility];  Service: Urology;  Laterality: Left;  . Holmium laser application Left 11/27/2012    Procedure: HOLMIUM LASER APPLICATION;  Surgeon: Garnett Farm, MD;  Location: [facility];  Service: Urology;  Laterality: Left;  . Cystoscopy with ureteroscopy and stent placement N/A 12/29/2012    Procedure: CYSTOSCOPY WITH URETEROSCOPY AND STENT PLACEMENT, removal of right and left stents, retrogrades, right stent exchange;  Surgeon: Garnett Farm, MD;  Location: [facility];  Service: Urology;  Laterality: N/A;  . Holmium laser application Right 12/29/2012    Procedure: HOLMIUM LASER APPLICATION;  Surgeon: Garnett Farm, MD;  Location: [facility];  Service: Urology;  Laterality: Right;  HLL OF (RT) URETERAL PELVIC JUNCTION STONE  . Cystoscopy with retrograde pyelogram, ureteroscopy and stent placement Right 01/29/2013    Procedure: CYSTOSCOPY WITH RIGHT URETEROSCOPY AND STENT PLACEMENT;  Surgeon: Garnett Farm, MD;  Location: [facility];  Service: Urology;  Laterality: Right;  . Holmium laser application N/A 01/29/2013    Procedure: HOLMIUM LASER APPLICATION;  Surgeon: Garnett Farm, MD;  Location: [facility];  Service: Urology;  Laterality: N/A;  . Esophagogastroduodenoscopy N/A 02/16/2013    Procedure: ESOPHAGOGASTRODUODENOSCOPY (EGD);  Surgeon: Meryl Dare, MD;  Location: [facility];  Service: Endoscopy;  Laterality: N/A;     Allergies  Allergen Reactions  . Sulfa Antibiotics Rash    Nausea       Family History  Problem Relation Age of Onset  . Heart disease Mother   . Early death Father   . Aneurysm Brother   . Aortic aneurysm Brother   .  Diabetes Sister      Social History Mr. Trageser reports that he has never smoked. He has never used smokeless tobacco. Mr. Lukes reports that he does not drink alcohol.   Review of Systems CONSTITUTIONAL: No weight loss, fever, chills, weakness or fatigue.  HEENT: Eyes: No visual loss, blurred vision, double vision or yellow sclerae.No hearing loss, sneezing, congestion, runny nose or sore throat.  SKIN: No rash or itching.  CARDIOVASCULAR: no chest pain, no palpitations RESPIRATORY: No shortness of breath, cough or  sputum.  GASTROINTESTINAL: No anorexia, nausea, vomiting or diarrhea. No abdominal pain or blood.  GENITOURINARY: No burning on urination, no polyuria NEUROLOGICAL: No headache, dizziness, syncope, paralysis, ataxia, numbness or tingling in the extremities. No change in bowel or bladder control.  MUSCULOSKELETAL: increased LE edema LYMPHATICS: No enlarged nodes. No history of splenectomy.  PSYCHIATRIC: No history of depression or anxiety.  ENDOCRINOLOGIC: No reports of sweating, cold or heat intolerance. No polyuria or polydipsia.  Marland Kitchen   Physical Examination There were no vitals filed for this visit. There were no vitals filed for this visit.  Gen: resting comfortably, no acute distress HEENT: no scleral icterus, pupils equal round and reactive, no palptable cervical adenopathy,  CV: irreg, no m/r/g, no JVD Resp: Clear to auscultation bilaterally GI: abdomen is soft, non-tender, non-distended, normal bowel sounds, no hepatosplenomegaly MSK: extremities are warm, 1+ bilateral edema.  Skin: warm, no rash Neuro:  no focal deficits Psych: appropriate affect   Diagnostic Studies 01/2012 [facility] [place] Aneurysm dilatation of the mid to distal abdominal aorta 4.7 cm 20.7x12.7x15.6 cm cystic structure anterior to the abdominal aorta    Assessment and Plan  1. Afib - no current symptoms - resume coumadin  2. Cardiomyopathy - now with normalized LVEF, will have him increase  his lasix over the next few days due to increased edema. Likely also related to ongoing urlogical issues        Antoine Poche, M.D., F.A.C.C.

## 2013-04-18 ENCOUNTER — Telehealth: Payer: Self-pay | Admitting: Cardiology

## 2013-04-18 MED ORDER — METOPROLOL TARTRATE 100 MG PO TABS: 100.0000 mg | ORAL_TABLET | Freq: Two times a day (BID) | ORAL | Status: DC

## 2013-04-18 MED ORDER — FUROSEMIDE 40 MG PO TABS: 40.0000 mg | ORAL_TABLET | Freq: Every morning | ORAL | Status: DC

## 2013-04-18 NOTE — Telephone Encounter (Signed)
Medication sent via escribe.  

## 2013-04-18 NOTE — Telephone Encounter (Signed)
Needs new RX's on Metoprolol Tart tab and Furosemide tab / tgs

## 2013-04-20 ENCOUNTER — Telehealth: Payer: Self-pay | Admitting: *Deleted

## 2013-04-20 NOTE — Telephone Encounter (Signed)
Refill authorization sent to Dr.Branch for signature

## 2013-04-20 NOTE — Telephone Encounter (Signed)
[facility] refill Metoprolol and furosemide  Slip in box, needs dr signature!

## 2013-04-23 ENCOUNTER — Telehealth: Payer: Self-pay | Admitting: *Deleted

## 2013-04-23 ENCOUNTER — Other Ambulatory Visit (HOSPITAL_COMMUNITY): Payer: Self-pay | Admitting: Urology

## 2013-04-23 ENCOUNTER — Other Ambulatory Visit: Payer: Self-pay | Admitting: Urology

## 2013-04-23 DIAGNOSIS — N135 Crossing vessel and stricture of ureter without hydronephrosis: Secondary | ICD-10-CM

## 2013-04-23 NOTE — Telephone Encounter (Signed)
[facility] is going to be having surgery on 05/07/12, for a cystoscopy and a stent. Needs to be off coumadin for 5 days and needs clearance for that.

## 2013-04-23 NOTE — Telephone Encounter (Signed)
Contact h h. Relay pt's message. Let hh know pt shouldn't start coumadin on his own, and that he is declining our w u and eval

## 2013-04-23 NOTE — Telephone Encounter (Addendum)
Left message to return call with home health 

## 2013-04-23 NOTE — Telephone Encounter (Signed)
Patient stated he had swelling in his ankle and the cardiologist doubled his Lasix and he is no longer having swelling. He did report he restarted his Coumadin- usually handled by Cardiologist and he dont need to come in and dont need any ultrasound. He is very upset home health called [place].

## 2013-04-23 NOTE — Telephone Encounter (Signed)
[facility] nurse called to report patient has been off coumadin for awhile about 6 months she thinks. He started coumadin back on his own because he was having swelling in his leg. She advised him that he cannot start this med back on his own but he did anyway. Stated he knows what dose to take that it had always been the same. She saw him on Friday and he had been taking for 1-2 days then. She goes back out to see tomorrow. She stated she can try and do INR if you would like and if pt agrees to it.  He saw specialist for the swelling and they doubled lasix for the swelling in his leg. Sarah's number is [phone].

## 2013-04-23 NOTE — Telephone Encounter (Signed)
This fellow is out of control. Stop coumadin. Ultrasound involved leg plus ov tomoroow

## 2013-04-23 NOTE — Telephone Encounter (Signed)
Please advise 

## 2013-04-23 NOTE — Telephone Encounter (Signed)
Notified Sarah with [facility].

## 2013-04-24 ENCOUNTER — Other Ambulatory Visit: Payer: Self-pay | Admitting: Urology

## 2013-04-24 ENCOUNTER — Encounter: Payer: Self-pay | Admitting: *Deleted

## 2013-04-24 NOTE — Telephone Encounter (Signed)
It is fine to be off coumadin for that amount of time. He does not need to be bridged with heparin.   Dina Rich MD

## 2013-04-24 NOTE — Telephone Encounter (Signed)
Clearance letter faxed to [facility]. 

## 2013-04-25 ENCOUNTER — Telehealth: Payer: Self-pay | Admitting: Cardiology

## 2013-04-25 NOTE — Telephone Encounter (Signed)
Clarication of pts dose Metoprolol Tartrate 100 mg po BID

## 2013-04-25 NOTE — Telephone Encounter (Signed)
**  Clarification Request / please see paper in refill bin / tgs**

## 2013-04-27 ENCOUNTER — Telehealth: Payer: Self-pay | Admitting: *Deleted

## 2013-04-27 MED ORDER — DIGOXIN 125 MCG PO TABS: 0.1250 mg | ORAL_TABLET | Freq: Every morning | ORAL | Status: DC

## 2013-04-27 MED ORDER — SIMVASTATIN 20 MG PO TABS: 20.0000 mg | ORAL_TABLET | Freq: Every day | ORAL | Status: DC

## 2013-04-27 MED ORDER — FUROSEMIDE 40 MG PO TABS: 40.0000 mg | ORAL_TABLET | Freq: Every morning | ORAL | Status: DC

## 2013-04-27 MED ORDER — DOXAZOSIN MESYLATE 4 MG PO TABS
4.0000 mg | ORAL_TABLET | Freq: Every day | ORAL | Status: DC
Start: 2013-04-27 — End: 2013-05-02

## 2013-04-27 MED ORDER — AMLODIPINE BESYLATE 5 MG PO TABS: 5.0000 mg | ORAL_TABLET | Freq: Every morning | ORAL | Status: DC

## 2013-04-27 MED ORDER — METOPROLOL TARTRATE 100 MG PO TABS: 100.0000 mg | ORAL_TABLET | Freq: Two times a day (BID) | ORAL | Status: DC

## 2013-04-27 NOTE — Telephone Encounter (Signed)
Medication sent via escribe.  

## 2013-04-27 NOTE — Telephone Encounter (Signed)
Pt state that all his medication should be ordered through [facility] not [facility]. He has never got RX through [facility]. Pt has had several Rx called in lately

## 2013-04-30 ENCOUNTER — Telehealth: Payer: Self-pay

## 2013-04-30 MED ORDER — DIGOXIN 125 MCG PO TABS: ORAL_TABLET | ORAL | Status: DC

## 2013-04-30 NOTE — Telephone Encounter (Signed)
Received fax to clarify instructions for Digoxin 0.125mg .  Fax copy in refill basket for nurse.

## 2013-04-30 NOTE — Telephone Encounter (Signed)
Per Dr.Branch, Digoxin dose is 0.125 mg QOD  [facility] faxed clarification to escript

## 2013-04-30 NOTE — Telephone Encounter (Signed)
Please clarify digoxin dose.should they being taking qd or only mon-frid only   thx cathey

## 2013-05-02 ENCOUNTER — Other Ambulatory Visit (HOSPITAL_COMMUNITY): Payer: Self-pay | Admitting: Urology

## 2013-05-02 ENCOUNTER — Encounter (HOSPITAL_COMMUNITY): Payer: Self-pay | Admitting: Pharmacy Technician

## 2013-05-02 NOTE — Patient Instructions (Addendum)
20 YIDA MEDOR  05/02/2013   Your procedure is scheduled on: Monday January 12th  Report to [facility] at 730 AM.  Call this number if you have problems the morning of surgery [phone]   Remember:   Do not eat food or drink liquids :After Midnight.     Take these medicines the morning of surgery with A SIP OF WATER: amlodipine, digoxin, metoprolol, simvastatin                                SEE [facility] PREPARING FOR SURGERY SHEET             You may not have any metal on your body including hair pins and piercings  Do not wear jewelry, make-up.  Do not wear lotions, powders, or perfumes. You may wear deodorant.   Men may shave face and neck.  Do not bring valuables to the hospital. [facility] IS NOT RESPONSIBLE FOR VALUEABLES.  Contacts, dentures or bridgework may not be worn into surgery.    Patients discharged the day of surgery will not be allowed to drive home.  Name and phone number of your driver:Palmeri,David Brother [phone]  Please read over the following fact sheets that you were given: [facility] Preparing for surgery sheet  Call Birdie Sons RN pre op nurse if needed [phone]    FAILURE TO FOLLOW THESE INSTRUCTIONS MAY RESULT IN THE CANCELLATION OF YOUR SURGERY.  PATIENT SIGNATURE___________________________________________  NURSE SIGNATURE_____________________________________________

## 2013-05-02 NOTE — Progress Notes (Signed)
ekg and chest xray dated 02-12-13 epic lov dr branch cardiology 04-11-13 epic Abdominal aorta duplex 02-02-12 epic

## 2013-05-03 ENCOUNTER — Encounter (HOSPITAL_COMMUNITY)
Admission: RE | Admit: 2013-05-03 | Discharge: 2013-05-03 | Disposition: A | Discharge status: Home / Self Care | Payer: Medicare Other | Source: Ambulatory Visit | Attending: Urology | Admitting: Urology

## 2013-05-03 ENCOUNTER — Encounter (HOSPITAL_COMMUNITY): Payer: Self-pay

## 2013-05-03 DIAGNOSIS — Z01812 Encounter for preprocedural laboratory examination: Secondary | ICD-10-CM | POA: Insufficient documentation

## 2013-05-03 DIAGNOSIS — N135 Crossing vessel and stricture of ureter without hydronephrosis: Secondary | ICD-10-CM | POA: Insufficient documentation

## 2013-05-03 LAB — BASIC METABOLIC PANEL
BUN: 47 mg/dL — ABNORMAL HIGH (ref 6–23)
CO2: 23 mEq/L (ref 19–32)
Calcium: 8.9 mg/dL (ref 8.4–10.5)
Chloride: 104 mEq/L (ref 96–112)
Creatinine, Ser: 2.68 mg/dL — ABNORMAL HIGH (ref 0.50–1.35)
GFR calc Af Amer: 23 mL/min — ABNORMAL LOW (ref >90)
GFR calc non Af Amer: 20 mL/min — ABNORMAL LOW (ref >90)
Glucose, Bld: 49 mg/dL — ABNORMAL LOW (ref 70–99)
Potassium: 5.1 mEq/L (ref 3.7–5.3)
Sodium: 139 mEq/L (ref 137–147)

## 2013-05-03 LAB — PROTIME-INR
INR: 2.22 — ABNORMAL HIGH (ref 0.00–1.49)
Prothrombin Time: 23.9 seconds — ABNORMAL HIGH (ref 11.6–15.2)

## 2013-05-03 LAB — CBC
HCT: 37.3 % — ABNORMAL LOW (ref 39.0–52.0)
Hemoglobin: 12 g/dL — ABNORMAL LOW (ref 13.0–17.0)
MCH: 31.3 pg (ref 26.0–34.0)
MCHC: 32.2 g/dL (ref 30.0–36.0)
MCV: 97.1 fL (ref 78.0–100.0)
Platelets: 184 10*3/uL (ref 150–400)
RBC: 3.84 MIL/uL — ABNORMAL LOW (ref 4.22–5.81)
RDW: 14.6 % (ref 11.5–15.5)
WBC: 6.9 10*3/uL (ref 4.0–10.5)

## 2013-05-03 LAB — APTT: aPTT: 51 seconds — ABNORMAL HIGH (ref 24–37)

## 2013-05-07 ENCOUNTER — Encounter (HOSPITAL_COMMUNITY): Payer: Medicare Other | Admitting: Certified Registered Nurse Anesthetist

## 2013-05-07 ENCOUNTER — Ambulatory Visit (HOSPITAL_COMMUNITY)
Admission: RE | Admit: 2013-05-07 | Discharge: 2013-05-07 | Disposition: A | Discharge status: Home / Self Care | Payer: Medicare Other | Source: Ambulatory Visit | Attending: Urology | Admitting: Urology

## 2013-05-07 ENCOUNTER — Encounter (HOSPITAL_COMMUNITY): Payer: Self-pay | Admitting: Certified Registered Nurse Anesthetist

## 2013-05-07 ENCOUNTER — Ambulatory Visit (HOSPITAL_COMMUNITY): Payer: Medicare Other

## 2013-05-07 ENCOUNTER — Ambulatory Visit (HOSPITAL_COMMUNITY): Payer: Medicare Other | Admitting: Certified Registered Nurse Anesthetist

## 2013-05-07 ENCOUNTER — Encounter (HOSPITAL_COMMUNITY)
Admission: RE | Disposition: A | Discharge status: Home / Self Care | Payer: Self-pay | Source: Ambulatory Visit | Attending: Urology

## 2013-05-07 DIAGNOSIS — H409 Unspecified glaucoma: Secondary | ICD-10-CM | POA: Diagnosis not present

## 2013-05-07 DIAGNOSIS — M129 Arthropathy, unspecified: Secondary | ICD-10-CM | POA: Diagnosis not present

## 2013-05-07 DIAGNOSIS — Z7901 Long term (current) use of anticoagulants: Secondary | ICD-10-CM | POA: Diagnosis not present

## 2013-05-07 DIAGNOSIS — Z9089 Acquired absence of other organs: Secondary | ICD-10-CM | POA: Insufficient documentation

## 2013-05-07 DIAGNOSIS — Z862 Personal history of diseases of the blood and blood-forming organs and certain disorders involving the immune mechanism: Secondary | ICD-10-CM | POA: Insufficient documentation

## 2013-05-07 DIAGNOSIS — M7989 Other specified soft tissue disorders: Secondary | ICD-10-CM | POA: Insufficient documentation

## 2013-05-07 DIAGNOSIS — N1330 Unspecified hydronephrosis: Secondary | ICD-10-CM | POA: Diagnosis not present

## 2013-05-07 DIAGNOSIS — N184 Chronic kidney disease, stage 4 (severe): Secondary | ICD-10-CM | POA: Insufficient documentation

## 2013-05-07 DIAGNOSIS — N200 Calculus of kidney: Secondary | ICD-10-CM

## 2013-05-07 DIAGNOSIS — I129 Hypertensive chronic kidney disease with stage 1 through stage 4 chronic kidney disease, or unspecified chronic kidney disease: Secondary | ICD-10-CM | POA: Insufficient documentation

## 2013-05-07 DIAGNOSIS — Z79899 Other long term (current) drug therapy: Secondary | ICD-10-CM | POA: Diagnosis not present

## 2013-05-07 DIAGNOSIS — I4891 Unspecified atrial fibrillation: Secondary | ICD-10-CM | POA: Diagnosis not present

## 2013-05-07 DIAGNOSIS — Z87442 Personal history of urinary calculi: Secondary | ICD-10-CM | POA: Diagnosis not present

## 2013-05-07 DIAGNOSIS — N135 Crossing vessel and stricture of ureter without hydronephrosis: Secondary | ICD-10-CM

## 2013-05-07 DIAGNOSIS — Z8639 Personal history of other endocrine, nutritional and metabolic disease: Secondary | ICD-10-CM | POA: Insufficient documentation

## 2013-05-07 HISTORY — PX: HOLMIUM LASER APPLICATION: SHX5852

## 2013-05-07 HISTORY — PX: CYSTOSCOPY WITH RETROGRADE PYELOGRAM, URETEROSCOPY AND STENT PLACEMENT: SHX5789

## 2013-05-07 LAB — PROTIME-INR
INR: 1.3 (ref 0.00–1.49)
Prothrombin Time: 15.9 seconds — ABNORMAL HIGH (ref 11.6–15.2)

## 2013-05-07 SURGERY — CYSTOURETEROSCOPY, WITH RETROGRADE PYELOGRAM AND STENT INSERTION
Anesthesia: General | Laterality: Right

## 2013-05-07 MED ORDER — OXYCODONE HCL 5 MG PO TABS: 5.0000 mg | ORAL_TABLET | Freq: Once | ORAL | Status: DC | PRN

## 2013-05-07 MED ORDER — ONDANSETRON HCL 4 MG/2ML IJ SOLN
INTRAMUSCULAR | Status: DC | PRN
  Administered 2013-05-07: 4 mg via INTRAVENOUS

## 2013-05-07 MED ORDER — LIDOCAINE HCL (CARDIAC) 20 MG/ML IV SOLN
INTRAVENOUS | Status: AC
  Filled 2013-05-07: qty 5

## 2013-05-07 MED ORDER — LIDOCAINE HCL (CARDIAC) 20 MG/ML IV SOLN
INTRAVENOUS | Status: DC | PRN
  Administered 2013-05-07: 80 mg via INTRAVENOUS

## 2013-05-07 MED ORDER — HYDROCODONE-ACETAMINOPHEN 7.5-325 MG PO TABS: 1.0000 | ORAL_TABLET | ORAL | Status: DC | PRN

## 2013-05-07 MED ORDER — LACTATED RINGERS IV SOLN
INTRAVENOUS | Status: DC
  Administered 2013-05-07: 08:00:00 via INTRAVENOUS
  Administered 2013-05-07: 1000 mL via INTRAVENOUS

## 2013-05-07 MED ORDER — PROPOFOL 10 MG/ML IV BOLUS
INTRAVENOUS | Status: AC
  Filled 2013-05-07: qty 20

## 2013-05-07 MED ORDER — LIDOCAINE HCL 2 % EX GEL
CUTANEOUS | Status: AC
  Filled 2013-05-07: qty 10

## 2013-05-07 MED ORDER — FENTANYL CITRATE 0.05 MG/ML IJ SOLN
INTRAMUSCULAR | Status: AC
  Filled 2013-05-07: qty 2

## 2013-05-07 MED ORDER — PHENYLEPHRINE HCL 10 MG/ML IJ SOLN
INTRAMUSCULAR | Status: DC | PRN
  Administered 2013-05-07: 80 ug via INTRAVENOUS

## 2013-05-07 MED ORDER — SODIUM CHLORIDE 0.9 % IR SOLN
Status: DC | PRN
  Administered 2013-05-07: 3000 mL

## 2013-05-07 MED ORDER — ONDANSETRON HCL 4 MG/2ML IJ SOLN
INTRAMUSCULAR | Status: AC
  Filled 2013-05-07: qty 2

## 2013-05-07 MED ORDER — PROPOFOL 10 MG/ML IV BOLUS
INTRAVENOUS | Status: DC | PRN
  Administered 2013-05-07: 120 mg via INTRAVENOUS
  Administered 2013-05-07: 60 mg via INTRAVENOUS

## 2013-05-07 MED ORDER — FENTANYL CITRATE 0.05 MG/ML IJ SOLN
INTRAMUSCULAR | Status: DC | PRN
  Administered 2013-05-07: 25 ug via INTRAVENOUS
  Administered 2013-05-07 (×2): 50 ug via INTRAVENOUS
  Administered 2013-05-07 (×2): 25 ug via INTRAVENOUS

## 2013-05-07 MED ORDER — 0.9 % SODIUM CHLORIDE (POUR BTL) OPTIME
TOPICAL | Status: DC | PRN
  Administered 2013-05-07: 1000 mL

## 2013-05-07 MED ORDER — IOHEXOL 300 MG/ML  SOLN
INTRAMUSCULAR | Status: DC | PRN
  Administered 2013-05-07: 10 mL

## 2013-05-07 MED ORDER — MEPERIDINE HCL 50 MG/ML IJ SOLN: 6.2500 mg | INTRAMUSCULAR | Status: DC | PRN

## 2013-05-07 MED ORDER — SUCCINYLCHOLINE CHLORIDE 20 MG/ML IJ SOLN
INTRAMUSCULAR | Status: DC | PRN
  Administered 2013-05-07: 100 mg via INTRAVENOUS

## 2013-05-07 MED ORDER — CIPROFLOXACIN IN D5W 400 MG/200ML IV SOLN
INTRAVENOUS | Status: AC
  Filled 2013-05-07: qty 200

## 2013-05-07 MED ORDER — OXYCODONE HCL 5 MG/5ML PO SOLN
5.0000 mg | Freq: Once | ORAL | Status: DC | PRN
  Filled 2013-05-07: qty 5

## 2013-05-07 MED ORDER — PROMETHAZINE HCL 25 MG/ML IJ SOLN: 6.2500 mg | INTRAMUSCULAR | Status: DC | PRN

## 2013-05-07 MED ORDER — CIPROFLOXACIN IN D5W 400 MG/200ML IV SOLN
400.0000 mg | INTRAVENOUS | Status: AC
  Administered 2013-05-07: 400 mg via INTRAVENOUS

## 2013-05-07 MED ORDER — HYDROMORPHONE HCL PF 1 MG/ML IJ SOLN: 0.2500 mg | INTRAMUSCULAR | Status: DC | PRN

## 2013-05-07 SURGICAL SUPPLY — 22 items
BAG URO CATCHER STRL LF (DRAPE) ×2 IMPLANT
BASKET ZERO TIP NITINOL 2.4FR (BASKET) IMPLANT
BSKT STON RTRVL ZERO TP 2.4FR (BASKET)
CATH INTERMIT  6FR 70CM (CATHETERS) ×2 IMPLANT
DRAPE CAMERA CLOSED 9X96 (DRAPES) ×2 IMPLANT
FIBER LASER FLEXIVA 365 (UROLOGICAL SUPPLIES) ×1 IMPLANT
GLOVE BIOGEL M 8.0 STRL (GLOVE) ×2 IMPLANT
GLOVE SURG SS PI 8.0 STRL IVOR (GLOVE) ×1 IMPLANT
GOWN STRL REUS W/ TWL XL LVL3 (GOWN DISPOSABLE) ×1 IMPLANT
GOWN STRL REUS W/TWL XL LVL3 (GOWN DISPOSABLE) ×2 IMPLANT
GUIDEWIRE ANG ZIPWIRE 038X150 (WIRE) IMPLANT
GUIDEWIRE STR DUAL SENSOR (WIRE) ×1 IMPLANT
KIT BALLN UROMAX 15FX4 (MISCELLANEOUS) IMPLANT
KIT BALLN UROMAX 26 75X4 (MISCELLANEOUS) ×1
MANIFOLD NEPTUNE II (INSTRUMENTS) ×2 IMPLANT
MARKER SKIN DUAL TIP RULER LAB (MISCELLANEOUS) ×1 IMPLANT
PACK CYSTO (CUSTOM PROCEDURE TRAY) ×2 IMPLANT
SHEATH ACCESS URETERAL 24CM (SHEATH) ×1 IMPLANT
SHEATH ACCESS URETERAL 38CM (SHEATH) ×1 IMPLANT
SHEATH ACCESS URETERAL 54CM (SHEATH) ×1 IMPLANT
TUBING CONNECTING 10 (TUBING) ×2 IMPLANT
WIRE COONS/BENSON .038X145CM (WIRE) ×1 IMPLANT

## 2013-05-07 NOTE — Progress Notes (Signed)
Rt nephrostomy tube and dressing CDI

## 2013-05-07 NOTE — Progress Notes (Signed)
Right nephrostomy tube dressing CDI 

## 2013-05-07 NOTE — Anesthesia Postprocedure Evaluation (Signed)
Anesthesia Post Note  Patient: [name]  Procedure(s) Performed: Procedure(s) (LRB): CYSTOSCOPY WITH RIGHT RETROGRADE PYELOGRAM, Balloon dilation of right ureter, Laser incision of right ureter, Nephrostogram (Right) HOLMIUM LASER APPLICATION (Right)  Anesthesia type: General  Patient location: PACU  Post pain: Pain level controlled  Post assessment: Post-op Vital signs reviewed  Last Vitals: BP 115/61  Pulse 79  Temp(Src) 36.3 C (Oral)  Resp 16  SpO2 100%  Post vital signs: Reviewed  Level of consciousness: sedated  Complications: No apparent anesthesia complications

## 2013-05-07 NOTE — Discharge Instructions (Signed)

## 2013-05-07 NOTE — H&P (Signed)
[name] is an 80 year old male with right ureteral obstruction.   History of Present Illness         Bilateral UPJ stones: The patient was found to have extremely severe bilateral hydronephrosis and underwent double-J stent placement bilaterally on 10/24/12. His hydronephrosis was secondary to bilateral UPJ stones.  His last stent migrated up the ureter and on 11/27/12 I removed his left ureteral stent, treated his left UPJ stone with laser lithotripsy and placed a new stent.  He underwent right ureteroscopy and partial fragmentation of his right ureteral stone on 12/29/12. At that time I removed his left ureteral stent.  On 01/29/13 I performed a second right ureteroscopy and removed the remaining stone from his ureter. A stent was left in place which was then removed on 02/06/13.    Persistent right ureteral obstruction: After having his chronically impacted right ureteral stone treated and his stent removed he developed elevated creatinine and on ultrasound in 10/14 was found to have right hydronephrosis. The percutaneous nephrostomy tube was placed and several attempts have been made to internalize the tube but the interventional radiologist was unsuccessful at negotiating the obstruction. A nephrostogram also revealed no contrast passing beyond the area of obstruction.    Urinary retention: He developed retention and had a Foley catheter placed.    Interval history: He has returned today to discuss further options regarding his right ureteral obstruction.   He had to questions. The first was that of what to do for fairly significant lower extremity swelling. He takes 40 mg of Lasix each morning.  His second concern was that I have insomnia. He tried over-the-counter sleep aids unsuccessfully. He said he was prescribed Ambien and he believes it was a 5 mg dose he said that did not seem to be affected. He says that he lies awake at night thinking about things and be done.   Past Medical  History Problems  1. History of Arthritis (V13.4) 2. History of Atrial fibrillation (427.31) 3. History of Calculus of left ureter (592.1) 4. History of Calculus of right ureter (592.1) 5. History of Chronic kidney disease, stage IV (severe) (585.4) 6. History of Gout (274.9) 7. History of glaucoma (V12.49) 8. History of hypertension (V12.59) 9. History of Hydronephrosis, left (591) 10. History of Urinary retention (788.20)  Surgical History Problems  1. History of Cholecystectomy 2. History of Cystoscopy With Insertion Of Ureteral Stent Bilateral 3. History of Cystoscopy With Insertion Of Ureteral Stent Left 4. History of Cystoscopy With Insertion Of Ureteral Stent Right 5. History of Cystoscopy With Insertion Of Ureteral Stent Right 6. History of Cystoscopy With Removal Of Object 7. History of Cystoscopy With Ureteroscopy With Lithotripsy 8. History of Cystoscopy With Ureteroscopy With Lithotripsy 9. History of Cystoscopy With Ureteroscopy With Lithotripsy 10. History of Cystoscopy With Ureteroscopy With Removal Of Calculus 11. History of Cystoscopy With Ureteroscopy With Removal Of Calculus 12. History of Eye Surgery 13. History of Lithotripsy 14. History of Surgery For Ruptured Abdominal Aortic Aneurysm  Current Meds 1. AmLODIPine Besylate TABS;  Therapy: (Recorded:16Jun2014) to Recorded 2. Colcrys 0.6 MG Oral Tablet;  Therapy: 14May2014 to Recorded 3. Digox 125 MCG Oral Tablet;  Therapy: 05Jun2014 to Recorded 4. Doxazosin Mesylate 4 MG Oral Tablet; TAKE 1 TABLET DAILY AS DIRECTED;  Therapy: 15Oct2014 to (Evaluate:14Nov2014)  Requested for: 15Oct2014; Last  Rx:15Oct2014 Ordered 5. Furosemide 40 MG Oral Tablet;  Therapy: (Recorded:16Jun2014) to Recorded 6. Hydrocodone-Acetaminophen 10-325 MG Oral Tablet; TAKE 1 TABLET EVERY 6 HOURS  AS NEEDED FOR  PAIN;  Therapy: 05Sep2014 to (Evaluate:22Oct2014); Last Rx:15Oct2014 Ordered 7. Latanoprost 0.005 % Ophthalmic Solution;   Therapy: 30Dec2013 to Recorded 8. Losartan Potassium 50 MG Oral Tablet;  Therapy: (Recorded:16Jun2014) to Recorded 9. Metoprolol Tartrate TABS;  Therapy: (Recorded:16Jun2014) to Recorded 10. Ondansetron 4 MG Oral Tablet Dispersible; TAKE 1 TABLET 3 times daily PRN nausea;   Therapy: 15Oct2014 to (Last Rx:15Oct2014)  Requested for: 15Oct2014 Ordered 11. Simvastatin 20 MG Oral Tablet;   Therapy: (Recorded:16Jun2014) to Recorded 12. SPS SUSP;   Therapy: (Recorded:16Jun2014) to Recorded 13. Vitamin B-12 TABS;   Therapy: (Recorded:16Jun2014) to Recorded 14. Warfarin Sodium 2.5 MG Oral Tablet;   Therapy: (Recorded:16Jun2014) to Recorded 15. Zolpidem Tartrate 5 MG Oral Tablet;   Therapy: 08Dec2014 to Recorded  Allergies Medication  1. Sulfa Drugs  Family History Problems  1. Family history of Father Deceased At Age 78 2. Family history of Gall stone pancreatitis : Father 3. Family history of Mother Deceased At Age 78 4. Family history of Nephrolithiasis  Social History Problems  1. Denied: History of Alcohol Use 2. Caffeine Use   3 cups coffee 3. Marital History - Single 4. Never A Smoker 5. Occupation: Retired 6. Denied: History of Tobacco Use  Review of Systems Genitourinary, constitutional, skin, eye, otolaryngeal, hematologic/lymphatic, cardiovascular, pulmonary, endocrine, musculoskeletal, gastrointestinal, neurological and psychiatric system(s) were reviewed and pertinent findings if present are noted.  Genitourinary: urinary frequency and nocturia.  Constitutional: feeling tired (fatigue).  ENT: sinus problems.  Respiratory: shortness of breath.  Musculoskeletal: joint pain.   Vitals Vital Signs   Weight: 185 lb  BMI Calculated: 26.54 BSA Calculated: 2.02 Blood Pressure: 106 / 63 Temperature: 97.1 F Heart Rate: 97.1  Physical Exam Constitutional: Well nourished and well developed . No acute distress.  ENT:. The ears and nose are normal in appearance.  Neck:  The appearance of the neck is normal and no neck mass is present.  Pulmonary: No respiratory distress and normal respiratory rhythm and effort.  Cardiovascular: Heart rate and rhythm are normal . No peripheral edema.  Abdomen: The abdomen is soft and nontender. No masses are palpated. No CVA tenderness. No hernias are palpable. No hepatosplenomegaly noted.  There is a nephrostomy tube located in the right flank which is draining clear urine.  Rectal: Rectal exam demonstrates normal sphincter tone, no tenderness and no masses. Estimated prostate size is 2+. The prostate has no nodularity and is not tender. The left seminal vesicle is nonpalpable. The right seminal vesicle is nonpalpable. The perineum is normal on inspection.  Genitourinary: Examination of the penis demonstrates no discharge, no masses, no lesions and a normal meatus. The scrotum is without lesions. The right epididymis is palpably normal and non-tender. The left epididymis is palpably normal and non-tender. The right testis is non-tender and without masses. The left testis is non-tender and without masses.  Lymphatics: The femoral and inguinal nodes are not enlarged or tender.  Skin: Normal skin turgor, no visible rash and no visible skin lesions.  Neuro/Psych:. Mood and affect are appropriate.  Results/Data Urine  COLOR YELLOW  APPEARANCE CLEAR  SPECIFIC GRAVITY 1.020  pH 6.0  GLUCOSE NEG mg/dL BILIRUBIN NEG  KETONE NEG mg/dL BLOOD NEG  PROTEIN 086100 mg/dL UROBILINOGEN 0.2 mg/dL NITRITE NEG  LEUKOCYTE ESTERASE NEG  SQUAMOUS EPITHELIAL/HPF RARE  WBC 0-2 WBC/hpf RBC 0-2 RBC/hpf BACTERIA RARE  CRYSTALS NONE SEEN  CASTS Hyaline casts noted  Other MUCUS NOTED   The following clinical lab reports were reviewed:  UA: Urine from his bladder  was noted to be completely clear.    Assessment  I had a long discussion with the patient about the fact that he currently has a right nephrostomy tube in place with relatively good  urine output through the tube. I have not been able to demonstrate a patent ureter beyond the area where his stone was previously impacted and I suspect there may be significant scar tissue that has formed in this location. Attempts at bypassing this area from above have been unsuccessful. I discussed with the interventional radiologist possible options and have reviewed those with the patient today. One obvious option would be continued percutaneous nephrostomy drainage. This would require occasional tube changes. The primary risk is that of dislodgment of the tube. Because his kidney does make urine I'm concerned that removal of the nephrostomy tube runs a risk of formation of a renocutaneous fistula. If this did not form it would result in eventual loss of renal function on the right side. This is significant due to his chronic renal insufficiency. The other option would be to attempt to gain access past the area of obstruction ureteroscopically. This is very difficult due to the anatomy and the right angle turn that the ureter takes at this location. It is fixed due to scar tissue from the stones presence for such a long time. There may however be a possibility of bypassing the obstruction but if this occurs I have only been able to pass a stent into the dilated proximal ureter just beyond this and not into the very capacious renal pelvis. If I did get access beyond the obstruction I have discussed with the interventional radiologist possibly attempting to grasp the stent and replace it with a longer stent across the area of obstruction. He does understand that if this is successful it would mean that he will have a permanent, indwelling stent that will require periodic changes.  I also offered to schedule him for an appointment for second opinion at a teaching institution. He told me that he would like me to try and if this was not successful and he would consider referral. He would like to go to [facility]  if that was necessary.  I told him that over the next 3-4 days I would recommend he increase his Lasix dose from 40 mg to 80 mg each morning. If this is effective he's going to contact Dr. Gerda Diss for consideration of a stronger dose of Lasix. If it's not effective after 3-4 days I told him he should also contact Dr. Gerda Diss to discuss alternate therapies.  He told me that he was prescribed Ambien for his insomnia. 5 mg was ineffective. I told him to check the dosage to be sure it was in fact a 5 mg dose so he is going to try to increase the dosage to 10 mg and see if this is effective. If not again he will contact Dr. Gerda Diss   Plan 1. Medication recommendations as above.  2. I'm going to schedule him for an attempt at ureteroscopic placement of a stent or guidewire through the obstructed area of his right ureter with interventional radiology working from above to try to grasp the guidewire and/or stent and unobstruct his kidney so his nephrostomy tube can be removed.

## 2013-05-07 NOTE — Transfer of Care (Signed)
Immediate Anesthesia Transfer of Care Note  Patient: [name]  Procedure(s) Performed: Procedure(s): CYSTOSCOPY WITH RIGHT RETROGRADE PYELOGRAM, Balloon dilation of right ureter, Laser incision of right ureter, Nephrostogram (Right) HOLMIUM LASER APPLICATION (Right)  Patient Location: PACU  Anesthesia Type:General  Level of Consciousness: awake, alert  and oriented  Airway & Oxygen Therapy: Patient Spontanous Breathing and Patient connected to face mask oxygen  Post-op Assessment: Report given to PACU RN and Post -op Vital signs reviewed and stable  Post vital signs: Reviewed and stable  Complications: No apparent anesthesia complications

## 2013-05-07 NOTE — Op Note (Signed)
PATIENT:  [name]  PRE-OPERATIVE DIAGNOSIS: Right ureteral stricture.  POST-OPERATIVE DIAGNOSIS: Obliterated right ureter secondary to stricture/scar.  PROCEDURE: 1.Cystoscopy with right retrograde pyelogram including interpretation 2. Right ureteroscopy 3. Dilation of right ureteral stricture 4. Laser incision of right ureteral stricture  SURGEON:  [place]  INDICATION: [name] is a 80 year old male who had bilateral obstructing UPJ stones with extremely severe bilateral hydronephrosis and elevated creatinine. He had his stones treated and the left side was rendered free of stone and remained open. The right side had a great deal of scar tissue due to the long-standing presence of the stone and had a significant amount of tortuosity as well. He had a stent in place and eventually the stent was removed and he developed obstruction with increasing hydronephrosis and a nephrostomy tube was inserted. 2 attempts were made to internalize his nephrostomy tube both of which were unsuccessful. I therefore discussed with the patient attempted ureteroscopic and potentially simultaneous manipulation from above by the interventional radiologist in order to gain access through the area of scar/stricture and allow the placement of a double-J stent with removal of his nephrostomy tube.  ANESTHESIA:  General  EBL:  Minimal  DRAINS: He has maintained his right nephrostomy tube.  LOCAL MEDICATIONS USED:  None  SPECIMEN: None    Description of procedure: After informed consent the patient was taken to the operating room and placed on the table in a supine position. General anesthesia was then administered. Once fully anesthetized the patient was moved to the dorsal lithotomy position and the genitalia were sterilely prepped and draped in standard fashion. An official timeout was then performed.  I initially passed the 22 French cystoscope down the ureter under direct vision into the bladder.  The bladder was noted to have trabeculation with normal appearing ureteral orifices. I proceeded initially with right retrograde pyelogram.  A 6 French open-ended ureteral catheter was then passed through the cystoscope and into the right ureteral orifice. I then injected full-strength contrast up the right ureter. Past to a level just above the iliac crest but did not enter the renal pelvis region.  A 0.038 inch floppy-tipped guidewire was then passed through the open ended catheter and left in place. The ureteral access sheath was then passed over the guidewire and fluoroscopic control and left in place. I then proceeded with flexible ureteroscopy using the digital scope. The scope was passed up the access sheath and to a point where there appeared to be some scar tissue. I was able to pass a guidewire through the ureteroscope and through the area of scar about 1-2 cm. I left this in place and removed the ureteroscope. I then performed dilatation of the strictured area that would not allow my scope passage beyond this area.  The ureteral dilating balloon was then passed over the guidewire under fluoroscopy as far as possible and then inflated. I then deflated this and removed it and replaced it with the ureteroscope. This had resulted in some slight dilatation of the area of scar and I could see what appeared to be a lumen beyond this location but could not pass the ureteroscope through this area. Removed the ureteroscope and tried to pass an access sheath over the guidewire to dilate the strictured area in order to gain access further up the ureter but this was unsuccessful. I injected further contrast through the ureteroscope with it held up against the area of narrowing and still could not get any contrast to  pass into the area of the renal pelvis.  With the guidewire left in place, Dr. Fredia SorrowYamagata, attempted to manipulate a Cobra catheter over the guidewire and gain access with a Roadrunner guidewire but  was unsuccessful. I therefore repeated ureteroscopy and could see what appeared to be a lumen. I therefore chose to use the holmium laser to incise the stricture with the realization that the ureteropelvic junction was currently completely obstructed and therefore a perforation of the ureter would have little to no adverse health effects. I therefore used a 360  laser fiber and incised the scar in 2 potential locations that were felt to possibly be the course of the ureter but when I advanced the ureteroscope through these areas no lumen could be identified. From previous ureteroscopy as I knew that the course of the ureter was essentially a 90 turn medially and looked in this location to attempt to find an area that was felt to be a lumen but was unsuccessful. There was no bleeding occurring. I therefore backed the scope down the ureter and then drained the bladder.  Dr. Fredia SorrowYamagata then performed an antegrade nephrostogram and we noted no evidence of extravasation from the renal pelvis. When the nephrostomy tube was uncapped we also noted that the urine that drained was completely clear indicating that there was never any communication between where I was working and the area the renal pelvis.    PLAN OF CARE: Discharge to home after PACU  PATIENT DISPOSITION:  PACU - hemodynamically stable.

## 2013-05-07 NOTE — Progress Notes (Signed)
Right nephrostomy tube dressing CDI

## 2013-05-07 NOTE — Anesthesia Preprocedure Evaluation (Signed)
Anesthesia Evaluation  Patient identified by MRN, date of birth, ID band Patient awake    Reviewed: Allergy & Precautions, H&P , NPO status , Patient's Chart, lab work & pertinent test results, reviewed documented beta blocker date and time   Airway Mallampati: II TM Distance: >3 FB Neck ROM: Full    Dental  (+) Dental Advisory Given and Teeth Intact   Pulmonary neg pulmonary ROS,  breath sounds clear to auscultation        Cardiovascular hypertension, Pt. on medications and Pt. on home beta blockers + Peripheral Vascular Disease and +CHF + dysrhythmias Atrial Fibrillation + Valvular Problems/Murmurs MR Rhythm:Regular Rate:Normal  Echo 01/2008 -  The left ventricle was moderately dilated. Overall left  ventricular systolic function was at the lower limits of normal. Left ventricular ejection fraction was estimated, range being 50 % to 55 %. There was no diagnostic evidence of left ventricular regional wall motion abnormalities. Left ventricular wall thickness was mildly increased.  -  There was mild aortic valvular regurgitation. The aortic regurgitation jet was central.  -  There was mild to moderate aortic root dilatation. There was mild fibrocalcific change of the aortic root. Ascending aortic dimension was 55 mm. There was accoustic shadowing - likely moderate atheroma of the descending aorta, although could correspond to reported thoracic aneurysm repair site. This region could be imaged angiographically if further definition needed.  -  There was mild mitral annular calcification. There was moderate mitral valvular regurgitation. The mitral regurgitation jet was eccentric. The effective orifice of mitral regurgitation by proximal isovelocity surface area was 0.14 cm^2. The volume of mitral regurgitation by proximal isovelocity surface area was 20 cc.  -  The left atrium was moderately to markedly dilated.  -  Right ventricular size was at  the upper limits of normal.  -  There was mild to moderate tricuspid valvular regurgitation.  -  The right atrium was moderately dilated.    Neuro/Psych negative neurological ROS  negative psych ROS   GI/Hepatic negative GI ROS, Neg liver ROS,   Endo/Other  negative endocrine ROS  Renal/GU CRF and Renal InsufficiencyRenal disease     Musculoskeletal negative musculoskeletal ROS (+)   Abdominal   Peds  Hematology negative hematology ROS (+)   Anesthesia Other Findings   Reproductive/Obstetrics                           Anesthesia Physical  Anesthesia Plan  ASA: III  Anesthesia Plan: General   Post-op Pain Management:    Induction: Intravenous  Airway Management Planned: LMA and Oral ETT  Additional Equipment:   Intra-op Plan:   Post-operative Plan: Extubation in OR  Informed Consent: I have reviewed the patients History and Physical, chart, labs and discussed the procedure including the risks, benefits and alternatives for the proposed anesthesia with the patient or authorized representative who has indicated his/her understanding and acceptance.   Dental advisory given  Plan Discussed with: CRNA  Anesthesia Plan Comments:         Anesthesia Quick Evaluation

## 2013-05-08 ENCOUNTER — Encounter (HOSPITAL_COMMUNITY): Payer: Self-pay | Admitting: Urology

## 2013-05-16 ENCOUNTER — Ambulatory Visit (INDEPENDENT_AMBULATORY_CARE_PROVIDER_SITE_OTHER): Payer: Medicare Other | Admitting: Family Medicine

## 2013-05-16 ENCOUNTER — Encounter: Payer: Self-pay | Admitting: Family Medicine

## 2013-05-16 ENCOUNTER — Other Ambulatory Visit: Payer: Self-pay | Admitting: *Deleted

## 2013-05-16 VITALS — BP 122/74 | Temp 103.1°F | Ht 70.0 in | Wt 191.0 lb

## 2013-05-16 DIAGNOSIS — R8281 Pyuria: Secondary | ICD-10-CM

## 2013-05-16 DIAGNOSIS — J111 Influenza due to unidentified influenza virus with other respiratory manifestations: Secondary | ICD-10-CM

## 2013-05-16 DIAGNOSIS — I714 Abdominal aortic aneurysm, without rupture, unspecified: Secondary | ICD-10-CM

## 2013-05-16 DIAGNOSIS — R82998 Other abnormal findings in urine: Secondary | ICD-10-CM

## 2013-05-16 DIAGNOSIS — R509 Fever, unspecified: Secondary | ICD-10-CM

## 2013-05-16 LAB — POCT URINALYSIS DIPSTICK
Blood, UA: 250
PH UA: 6
Protein, UA: 500
Spec Grav, UA: <=1.005

## 2013-05-16 MED ORDER — OSELTAMIVIR PHOSPHATE 75 MG PO CAPS: 75.0000 mg | ORAL_CAPSULE | Freq: Two times a day (BID) | ORAL | Status: DC

## 2013-05-16 MED ORDER — CEFPROZIL 500 MG PO TABS: 500.0000 mg | ORAL_TABLET | Freq: Two times a day (BID) | ORAL | Status: DC

## 2013-05-16 MED ORDER — ONDANSETRON HCL 8 MG PO TABS: 8.0000 mg | ORAL_TABLET | Freq: Three times a day (TID) | ORAL | Status: DC | PRN

## 2013-05-16 NOTE — Progress Notes (Addendum)
   Subjective:    Patient ID: [name], male    DOB: 1925/12/30, 80 y.o.   MRN: [id number]  Fever  This is a new problem. The current episode started today. Pertinent negatives include no chest pain, congestion, coughing, ear pain or wheezing. Associated symptoms comments: Chills, nausea, body aches.  some nausea Runny  Nose , occas cough Bad chills  Patient denies lower abdominal pain  Review of Systems  Constitutional: Positive for fever. Negative for activity change.  HENT: Negative for congestion, ear pain and rhinorrhea.   Eyes: Negative for discharge.  Respiratory: Negative for cough and wheezing.   Cardiovascular: Negative for chest pain.       Objective:   Physical Exam  Nursing note and vitals reviewed. Constitutional: He appears well-developed.  HENT:  Head: Normocephalic.  Mouth/Throat: Oropharynx is clear and moist. No oropharyngeal exudate.  Neck: Normal range of motion.  Cardiovascular: Normal rate, regular rhythm and normal heart sounds.   No murmur heard. Pulmonary/Chest: Effort normal and breath sounds normal. He has no wheezes.  Lymphadenopathy:    He has no cervical adenopathy.  Neurological: He exhibits normal muscle tone.  Skin: Skin is warm and dry.   Patient does have a runny nose.       Assessment & Plan:   #1 probable flu-Tamiflu twice a day for the next 5 days. #2 mild UTI-Cefzil 500 twice a day for the next 7 days #3 febrile illness he should improve with this over the next 48 hours I believe the source of this is the flu I told him if he gets worse over the next 48 hours he needs to go to the ER or followup here. #4 patient misplaced his keys for his car we did our best to find could not find him a family friend drove him home.  Because of antibiotics we will recheck his INR on Monday. Our staff will notify him of this.

## 2013-05-16 NOTE — Patient Instructions (Signed)
,  Influenza, Adult Influenza ("the flu") is a viral infection of the respiratory tract. It occurs more often in winter months because people spend more time in close contact with one another. Influenza can make you feel very sick. Influenza easily spreads from person to person (contagious). CAUSES  Influenza is caused by a virus that infects the respiratory tract. You can catch the virus by breathing in droplets from an infected person's cough or sneeze. You can also catch the virus by touching something that was recently contaminated with the virus and then touching your mouth, nose, or eyes. SYMPTOMS  Symptoms typically last 4 to 10 days and may include:  Fever.  Chills.  Headache, body aches, and muscle aches.  Sore throat.  Chest discomfort and cough.  Poor appetite.  Weakness or feeling tired.  Dizziness.  Nausea or vomiting. DIAGNOSIS  Diagnosis of influenza is often made based on your history and a physical exam. A nose or throat swab test can be done to confirm the diagnosis. RISKS AND COMPLICATIONS You may be at risk for a more severe case of influenza if you smoke cigarettes, have diabetes, have chronic heart disease (such as heart failure) or lung disease (such as asthma), or if you have a weakened immune system. Elderly people and pregnant women are also at risk for more serious infections. The most common complication of influenza is a lung infection (pneumonia). Sometimes, this complication can require emergency medical care and may be life-threatening. PREVENTION  An annual influenza vaccination (flu shot) is the best way to avoid getting influenza. An annual flu shot is now routinely recommended for all adults in the [place] TREATMENT  In mild cases, influenza goes away on its own. Treatment is directed at relieving symptoms. For more severe cases, your caregiver may prescribe antiviral medicines to shorten the sickness. Antibiotic medicines are not effective, because the  infection is caused by a virus, not by bacteria. HOME CARE INSTRUCTIONS  Only take over-the-counter or prescription medicines for pain, discomfort, or fever as directed by your caregiver.  Use a cool mist humidifier to make breathing easier.  Get plenty of rest until your temperature returns to normal. This usually takes 3 to 4 days.  Drink enough fluids to keep your urine clear or pale yellow.  Cover your mouth and nose when coughing or sneezing, and wash your hands well to avoid spreading the virus.  Stay home from work or school until your fever has been gone for at least 1 full day. SEEK MEDICAL CARE IF:   You have chest pain or a deep cough that worsens or produces more mucus.  You have nausea, vomiting, or diarrhea. SEEK IMMEDIATE MEDICAL CARE IF:   You have difficulty breathing, shortness of breath, or your skin or nails turn bluish.  You have severe neck pain or stiffness.  You have a severe headache, facial pain, or earache.  You have a worsening or recurring fever.  You have nausea or vomiting that cannot be controlled. MAKE SURE YOU:  Understand these instructions.  Will watch your condition.  Will get help right away if you are not doing well or get worse. Document Released: 04/09/2000 Document Revised: 10/12/2011 Document Reviewed: 07/12/2011 [facility] Patient Information 2014 [place], [place].

## 2013-05-17 ENCOUNTER — Telehealth: Payer: Self-pay | Admitting: *Deleted

## 2013-05-17 NOTE — Telephone Encounter (Signed)
Spoke with patient and advised patient that due to the antibiotics he was placed on yesterday and the fact that he is on Coumadin -he would need his INR checked on Friday or Monday. Patient stated that he gets his INRs checked at the coumadin clinc at [facility]. Patient stated he will call them today to set up an appt at the coumadin clinic to have his INR tested Monday or Tuesday.

## 2013-05-23 ENCOUNTER — Ambulatory Visit (INDEPENDENT_AMBULATORY_CARE_PROVIDER_SITE_OTHER): Payer: Medicare Other | Admitting: *Deleted

## 2013-05-23 DIAGNOSIS — I4891 Unspecified atrial fibrillation: Secondary | ICD-10-CM

## 2013-05-23 DIAGNOSIS — I482 Chronic atrial fibrillation, unspecified: Secondary | ICD-10-CM

## 2013-05-23 DIAGNOSIS — Z7901 Long term (current) use of anticoagulants: Secondary | ICD-10-CM

## 2013-05-23 LAB — POCT INR: INR: 1.5

## 2013-05-24 ENCOUNTER — Other Ambulatory Visit (HOSPITAL_COMMUNITY): Payer: Self-pay | Admitting: Urology

## 2013-05-24 DIAGNOSIS — N135 Crossing vessel and stricture of ureter without hydronephrosis: Secondary | ICD-10-CM

## 2013-06-04 ENCOUNTER — Ambulatory Visit (INDEPENDENT_AMBULATORY_CARE_PROVIDER_SITE_OTHER): Payer: Medicare Other | Admitting: *Deleted

## 2013-06-04 DIAGNOSIS — I482 Chronic atrial fibrillation, unspecified: Secondary | ICD-10-CM

## 2013-06-04 DIAGNOSIS — Z5181 Encounter for therapeutic drug level monitoring: Secondary | ICD-10-CM

## 2013-06-04 DIAGNOSIS — I4891 Unspecified atrial fibrillation: Secondary | ICD-10-CM

## 2013-06-04 DIAGNOSIS — Z7901 Long term (current) use of anticoagulants: Secondary | ICD-10-CM

## 2013-06-04 LAB — POCT INR: INR: 2.8

## 2013-06-06 ENCOUNTER — Emergency Department (HOSPITAL_COMMUNITY)
Admission: EM | Admit: 2013-06-06 | Discharge: 2013-06-06 | Disposition: A | Discharge status: Home / Self Care | Payer: Medicare Other | Attending: Emergency Medicine | Admitting: Emergency Medicine

## 2013-06-06 ENCOUNTER — Encounter (HOSPITAL_COMMUNITY): Payer: Self-pay | Admitting: Emergency Medicine

## 2013-06-06 ENCOUNTER — Emergency Department (HOSPITAL_COMMUNITY): Payer: Medicare Other

## 2013-06-06 DIAGNOSIS — I509 Heart failure, unspecified: Secondary | ICD-10-CM | POA: Insufficient documentation

## 2013-06-06 DIAGNOSIS — N183 Chronic kidney disease, stage 3 unspecified: Secondary | ICD-10-CM | POA: Insufficient documentation

## 2013-06-06 DIAGNOSIS — H409 Unspecified glaucoma: Secondary | ICD-10-CM | POA: Insufficient documentation

## 2013-06-06 DIAGNOSIS — D518 Other vitamin B12 deficiency anemias: Secondary | ICD-10-CM | POA: Insufficient documentation

## 2013-06-06 DIAGNOSIS — E785 Hyperlipidemia, unspecified: Secondary | ICD-10-CM | POA: Insufficient documentation

## 2013-06-06 DIAGNOSIS — G959 Disease of spinal cord, unspecified: Secondary | ICD-10-CM

## 2013-06-06 DIAGNOSIS — Z87442 Personal history of urinary calculi: Secondary | ICD-10-CM | POA: Insufficient documentation

## 2013-06-06 DIAGNOSIS — M129 Arthropathy, unspecified: Secondary | ICD-10-CM | POA: Insufficient documentation

## 2013-06-06 DIAGNOSIS — M542 Cervicalgia: Secondary | ICD-10-CM

## 2013-06-06 DIAGNOSIS — M109 Gout, unspecified: Secondary | ICD-10-CM | POA: Insufficient documentation

## 2013-06-06 DIAGNOSIS — Z7901 Long term (current) use of anticoagulants: Secondary | ICD-10-CM | POA: Insufficient documentation

## 2013-06-06 DIAGNOSIS — M4712 Other spondylosis with myelopathy, cervical region: Secondary | ICD-10-CM | POA: Insufficient documentation

## 2013-06-06 DIAGNOSIS — M47812 Spondylosis without myelopathy or radiculopathy, cervical region: Secondary | ICD-10-CM

## 2013-06-06 DIAGNOSIS — H9190 Unspecified hearing loss, unspecified ear: Secondary | ICD-10-CM | POA: Insufficient documentation

## 2013-06-06 DIAGNOSIS — I4891 Unspecified atrial fibrillation: Secondary | ICD-10-CM | POA: Insufficient documentation

## 2013-06-06 DIAGNOSIS — Z79899 Other long term (current) drug therapy: Secondary | ICD-10-CM | POA: Insufficient documentation

## 2013-06-06 DIAGNOSIS — IMO0002 Reserved for concepts with insufficient information to code with codable children: Secondary | ICD-10-CM | POA: Insufficient documentation

## 2013-06-06 LAB — CBC WITH DIFFERENTIAL/PLATELET
Basophils Absolute: 0 10*3/uL (ref 0.0–0.1)
Basophils Relative: 0 % (ref 0–1)
Eosinophils Absolute: 0.1 10*3/uL (ref 0.0–0.7)
Eosinophils Relative: 1 % (ref 0–5)
HCT: 37.3 % — ABNORMAL LOW (ref 39.0–52.0)
HEMOGLOBIN: 12.3 g/dL — AB (ref 13.0–17.0)
LYMPHS ABS: 0.5 10*3/uL — AB (ref 0.7–4.0)
LYMPHS PCT: 6 % — AB (ref 12–46)
MCH: 31.2 pg (ref 26.0–34.0)
MCHC: 33 g/dL (ref 30.0–36.0)
MCV: 94.7 fL (ref 78.0–100.0)
Monocytes Absolute: 0.9 10*3/uL (ref 0.1–1.0)
Monocytes Relative: 10 % (ref 3–12)
NEUTROS PCT: 84 % — AB (ref 43–77)
Neutro Abs: 7.5 10*3/uL (ref 1.7–7.7)
Platelets: 159 10*3/uL (ref 150–400)
RBC: 3.94 MIL/uL — AB (ref 4.22–5.81)
RDW: 13.1 % (ref 11.5–15.5)
WBC: 8.9 10*3/uL (ref 4.0–10.5)

## 2013-06-06 LAB — TROPONIN I: Troponin I: <0.3 ng/mL (ref <0.30)

## 2013-06-06 LAB — BASIC METABOLIC PANEL
BUN: 33 mg/dL — ABNORMAL HIGH (ref 6–23)
CO2: 25 meq/L (ref 19–32)
Calcium: 8.8 mg/dL (ref 8.4–10.5)
Chloride: 100 mEq/L (ref 96–112)
Creatinine, Ser: 2.15 mg/dL — ABNORMAL HIGH (ref 0.50–1.35)
GFR calc Af Amer: 31 mL/min — ABNORMAL LOW (ref >90)
GFR calc non Af Amer: 26 mL/min — ABNORMAL LOW (ref >90)
GLUCOSE: 104 mg/dL — AB (ref 70–99)
Potassium: 4.6 mEq/L (ref 3.7–5.3)
SODIUM: 137 meq/L (ref 137–147)

## 2013-06-06 LAB — DIGOXIN LEVEL: DIGOXIN LVL: 0.4 ng/mL — AB (ref 0.8–2.0)

## 2013-06-06 MED ORDER — KETOROLAC TROMETHAMINE 60 MG/2ML IM SOLN
30.0000 mg | Freq: Once | INTRAMUSCULAR | Status: AC
  Administered 2013-06-06: 30 mg via INTRAMUSCULAR
  Filled 2013-06-06: qty 2

## 2013-06-06 MED ORDER — PREDNISONE 20 MG PO TABS: 20.0000 mg | ORAL_TABLET | Freq: Two times a day (BID) | ORAL | Status: DC

## 2013-06-06 NOTE — ED Notes (Signed)
Pt c/o cp radiating to shoulders/back/neck since Sunday.

## 2013-06-06 NOTE — Discharge Instructions (Signed)
Musculoskeletal Pain Musculoskeletal pain is muscle and boney aches and pains. These pains can occur in any part of the body. Your caregiver may treat you without knowing the cause of the pain. They may treat you if blood or urine tests, X-rays, and other tests were normal.  CAUSES There is often not a definite cause or reason for these pains. These pains may be caused by a type of germ (virus). The discomfort may also come from overuse. Overuse includes working out too hard when your body is not fit. Boney aches also come from weather changes. Bone is sensitive to atmospheric pressure changes. HOME CARE INSTRUCTIONS   Ask when your test results will be ready. Make sure you get your test results.  Only take over-the-counter or prescription medicines for pain, discomfort, or fever as directed by your caregiver. If you were given medications for your condition, do not drive, operate machinery or power tools, or sign legal documents for 24 hours. Do not drink alcohol. Do not take sleeping pills or other medications that may interfere with treatment.  Continue all activities unless the activities cause more pain. When the pain lessens, slowly resume normal activities. Gradually increase the intensity and duration of the activities or exercise.  During periods of severe pain, bed rest may be helpful. Lay or sit in any position that is comfortable.  Putting ice on the injured area.  Put ice in a bag.  Place a towel between your skin and the bag.  Leave the ice on for 15 to 20 minutes, 3 to 4 times a day.  Follow up with your caregiver for continued problems and no reason can be found for the pain. If the pain becomes worse or does not go away, it may be necessary to repeat tests or do additional testing. Your caregiver may need to look further for a possible cause. SEEK IMMEDIATE MEDICAL CARE IF:  You have pain that is getting worse and is not relieved by medications.  You develop chest pain  that is associated with shortness or breath, sweating, feeling sick to your stomach (nauseous), or throw up (vomit).  Your pain becomes localized to the abdomen.  You develop any new symptoms that seem different or that concern you. MAKE SURE YOU:   Understand these instructions.  Will watch your condition.  Will get help right away if you are not doing well or get worse. Document Released: 04/12/2005 Document Revised: 07/05/2011 Document Reviewed: 12/15/2012 [facility] Patient Information 2014 [place], [place].  Degenerative Disk Disease Degenerative disk disease is a condition caused by the changes that occur in the cushions of the backbone (spinal disks) as you grow older. Spinal disks are soft and compressible disks located between the bones of the spine (vertebrae). They act like shock absorbers. Degenerative disk disease can affect the whole spine. However, the neck and lower back are most commonly affected. Many changes can occur in the spinal disks with aging, such as:  The spinal disks may dry and shrink.  Small tears may occur in the tough, outer covering of the disk (annulus).  The disk space may become smaller due to loss of water.  Abnormal growths in the bone (spurs) may occur. This can put pressure on the nerve roots exiting the spinal canal, causing pain.  The spinal canal may become narrowed. CAUSES  Degenerative disk disease is a condition caused by the changes that occur in the spinal disks with aging. The exact cause is not known, but there is a  genetic basis for many patients. Degenerative changes can occur due to loss of fluid in the disk. This makes the disk thinner and reduces the space between the backbones. Small cracks can develop in the outer layer of the disk. This can lead to the breakdown of the disk. You are more likely to get degenerative disk disease if you are overweight. Smoking cigarettes and doing heavy work such as weightlifting can also increase your  risk of this condition. Degenerative changes can start after a sudden injury. Growth of bone spurs can compress the nerve roots and cause pain.  SYMPTOMS  The symptoms vary from person to person. Some people may have no pain, while others have severe pain. The pain may be so severe that it can limit your activities. The location of the pain depends on the part of your backbone that is affected. You will have neck or arm pain if a disk in the neck area is affected. You will have pain in your back, buttocks, or legs if a disk in the lower back is affected. The pain becomes worse while bending, reaching up, or with twisting movements. The pain may start gradually and then get worse as time passes. It may also start after a major or minor injury. You may feel numbness or tingling in the arms or legs.  DIAGNOSIS  Your caregiver will ask you about your symptoms and about activities or habits that may cause the pain. He or she may also ask about any injuries, diseases, or treatments you have had earlier. Your caregiver will examine you to check for the range of movement that is possible in the affected area, to check for strength in your extremities, and to check for sensation in the areas of the arms and legs supplied by different nerve roots. An X-ray of the spine may be taken. Your caregiver may suggest other imaging tests, such as magnetic resonance imaging (MRI), if needed.  TREATMENT  Treatment includes rest, modifying your activities, and applying ice and heat. Your caregiver may prescribe medicines to reduce your pain and may ask you to do some exercises to strengthen your back. In some cases, you may need surgery. You and your caregiver will decide on the treatment that is best for you. HOME CARE INSTRUCTIONS   Follow proper lifting and walking techniques as advised by your caregiver.  Maintain good posture.  Exercise regularly as advised.  Perform relaxation exercises.  Change your sitting,  standing, and sleeping habits as advised. Change positions frequently.  Lose weight as advised.  Stop smoking if you smoke.  Wear supportive footwear. SEEK MEDICAL CARE IF:  Your pain does not go away within 1 to 4 weeks. SEEK IMMEDIATE MEDICAL CARE IF:   Your pain is severe.  You notice weakness in your arms, hands, or legs.  You begin to lose control of your bladder or bowel movements. MAKE SURE YOU:   Understand these instructions.  Will watch your condition.  Will get help right away if you are not doing well or get worse. Document Released: 02/07/2007 Document Revised: 07/05/2011 Document Reviewed: 02/07/2007 [facility] Patient Information 2014 [place]

## 2013-06-06 NOTE — ED Provider Notes (Signed)
CSN: [id number]     Arrival date & time 06/06/13  0706 History   First MD Initiated Contact with Patient 06/06/13 [phone]     Chief Complaint  Patient presents with  . Chest Pain     (Consider location/radiation/quality/duration/timing/severity/associated sxs/prior Treatment) Patient is a 80 y.o. male presenting with chest pain. The history is provided by the patient.  Chest Pain  he complains of neck pain that radiates to his bilateral anterior clavicle area. He denies chest pain. Chief complaint was entered the ancillary staff. I disagree with the chief complaint. The discomfort has been present for several days. The discomfort is constant. The discomfort feels like a sharp pain. The discomfort is worse when he moves his neck. He has pain that radiates to his left arm. He denies paresthesias, weakness, nausea, vomiting, cough, shortness of breath, back pain or difficulty walking. He drove his car to the emergency department today. He is taking his medications as prescribed. There are no other known modifying factors.  Past Medical History  Diagnosis Date  . Hyperlipidemia   . White coat hypertension   . Cardiomyopathy      EF:30-40% in 06/1998, but 50-55% in 2009;  HX ISCHEMIC CARDIOMYOPATHY  . Chronic atrial fibrillation   . Vitamin B12 deficiency   . Chronic anticoagulation   . Arthritis   . Sensory neuropathy   . Chronic kidney disease, stage III (moderate)     creatinine 1.5 2001, 2.0 2009, 2.28 in 12/2009  . Mild renal insufficiency   . Hydronephrosis, bilateral   . Ureteral calculi     BILATERAL  . History of CHF (congestive heart failure)     1999  &  2005  . History of kidney stones   . Bilateral renal cysts   . 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 Chronic venous insufficiency   . PAD (peripheral artery disease)   . Edema of both legs   . History of gout      per pt stable  . Glaucoma of both eyes   . Wears hearing aid     bilateral   Past Surgical History  Procedure Laterality Date  . Repair thoracic aorta  01/1995    s/p rupture  . Cataract extraction w/ intraocular lens  implant, bilateral  2012  . 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 Laparoscopic cholecystectomy  JAN 2005  . Percutaneous nephrostolithotomy  1970's  . Abdominal aortagram  07-16-2003  DR ROTHBART    W/ COIL EMBOLIZATION OF SIX BRANCHES OF RIGHT INTERNAL RENAL ARTERY ANEURYSM  . Cardiovascular stress test  04-25-2003    LOW RISK CARDIOLITE STUDY/ MODERATE LV DILATATION AND IMPAIRMENT OF LVSF/  NO ISCHEMIA  . Transthoracic echocardiogram  02-07-2008  DR ROTHBART    MODERATE DILATED LV/  EF 50-55%/ MILD AR/ MILD TO MODERATE AORTIC ROOT DILATATION/ MODERATE MR/ MODERATE LA   &  RA   DIILATED/ MILD TO MODERATE TR  . Cystoscopy w/ ureteral stent placement Bilateral 10/24/2012    Procedure: CYSTOSCOPY WITH RETROGRADE PYELOGRAM/URETERAL STENT PLACEMENT;  Surgeon: Lindaann Slough, MD;  Location: [facility];  Service: Urology;  Laterality: Bilateral;  . Cystoscopy with ureteroscopy Left 11/27/2012    Procedure: CYSTOSCOPY,LEFT URETEROSCOPY WITH LASER LITHOTRIPSY/REMOVAL OF MIGRATED STENT, PLACEMENT OF LEFT STENT;  Surgeon: Loraine Leriche  Collier Salina, MD;  Location: [facility];  Service: Urology;  Laterality: Left;  . Holmium laser application Left 11/27/2012    Procedure: HOLMIUM LASER APPLICATION;  Surgeon: Garnett Farm, MD;  Location: [facility];  Service: Urology;  Laterality: Left;  . Cystoscopy with ureteroscopy and stent placement N/A 12/29/2012    Procedure: CYSTOSCOPY WITH URETEROSCOPY AND STENT PLACEMENT, removal of right and left stents, retrogrades, right stent exchange;  Surgeon: Garnett Farm, MD;  Location: [facility];  Service: Urology;  Laterality: N/A;  . Holmium laser application Right 12/29/2012    Procedure: HOLMIUM LASER  APPLICATION;  Surgeon: Garnett Farm, MD;  Location: [facility];  Service: Urology;  Laterality: Right;  HLL OF (RT) URETERAL PELVIC JUNCTION STONE  . Cystoscopy with retrograde pyelogram, ureteroscopy and stent placement Right 01/29/2013    Procedure: CYSTOSCOPY WITH RIGHT URETEROSCOPY AND STENT PLACEMENT;  Surgeon: Garnett Farm, MD;  Location: [facility];  Service: Urology;  Laterality: Right;  . Holmium laser application N/A 01/29/2013    Procedure: HOLMIUM LASER APPLICATION;  Surgeon: Garnett Farm, MD;  Location: [facility];  Service: Urology;  Laterality: N/A;  . Esophagogastroduodenoscopy N/A 02/16/2013    Procedure: ESOPHAGOGASTRODUODENOSCOPY (EGD);  Surgeon: Meryl Dare, MD;  Location: [facility];  Service: Endoscopy;  Laterality: N/A;  . Cystoscopy with retrograde pyelogram, ureteroscopy and stent placement Right 05/07/2013    Procedure: CYSTOSCOPY WITH RIGHT RETROGRADE PYELOGRAM, Balloon dilation of right ureter, Laser incision of right ureter, Nephrostogram;  Surgeon: Garnett Farm, MD;  Location: [facility];  Service: Urology;  Laterality: Right;  . Holmium laser application Right 05/07/2013    Procedure: HOLMIUM LASER APPLICATION;  Surgeon: Garnett Farm, MD;  Location: [facility];  Service: Urology;  Laterality: Right;   Family History  Problem Relation Age of Onset  . Heart disease Mother   . Early death Father   . Aneurysm Brother   . Aortic aneurysm Brother   . Diabetes Sister    History  Substance Use Topics  . Smoking status: Never Smoker   . Smokeless tobacco: Never Used  . Alcohol Use: No    Review of Systems  Cardiovascular: Positive for chest pain.  All other systems reviewed and are negative.      Allergies  Sulfa antibiotics  Home Medications   Current Outpatient Rx  Name  Route  Sig  Dispense  Refill  . acetaminophen (TYLENOL) 500 MG tablet   Oral   Take 1,000 mg by mouth every 6 (six) hours as needed for mild pain, moderate pain or headache.          Marland Kitchen  amLODipine (NORVASC) 5 MG tablet   Oral   Take 5 mg by mouth every morning.         Marland Kitchen COLCRYS 0.6 MG tablet   Oral   Take 0.6 mg by mouth daily as needed (gout).          . Cyanocobalamin (VITAMIN B-12) 2000 MCG TBCR   Oral   Take 2,000 mcg by mouth daily.          . digoxin (LANOXIN) 0.125 MG tablet   Oral   Take 0.125 mg by mouth as directed. Take every day except for weekends         . doxazosin (CARDURA) 4 MG tablet   Oral   Take 4 mg by mouth at bedtime.         . furosemide (LASIX) 40 MG tablet   Oral  Take 40 mg by mouth every morning.         . latanoprost (XALATAN) 0.005 % ophthalmic solution   Both Eyes   Place 1 drop into both eyes at bedtime.          . metoprolol (LOPRESSOR) 100 MG tablet   Oral   Take 100 mg by mouth 2 (two) times daily.         . simvastatin (ZOCOR) 20 MG tablet   Oral   Take 20 mg by mouth daily with breakfast.         . warfarin (COUMADIN) 2.5 MG tablet   Oral   Take 2.5 mg by mouth daily with breakfast.          . zolpidem (AMBIEN) 5 MG tablet   Oral   Take 5 mg by mouth at bedtime as needed for sleep.         . predniSONE (DELTASONE) 20 MG tablet   Oral   Take 1 tablet (20 mg total) by mouth 2 (two) times daily.   10 tablet   0    BP 109/64  Pulse 89  Temp(Src) 97.9 F (36.6 C)  Resp 16  Ht 5\' 10"  (1.778 m)  Wt 185 lb (83.915 kg)  BMI 26.54 kg/m2  SpO2 97% Physical Exam  Nursing note and vitals reviewed. Constitutional: He is oriented to person, place, and time. He appears well-developed. No distress.  Elderly, frail  HENT:  Head: Normocephalic and atraumatic.  Right Ear: External ear normal.  Left Ear: External ear normal.  Eyes: Conjunctivae and EOM are normal. Pupils are equal, round, and reactive to light.  Neck: Normal range of motion and phonation normal. Neck supple.  Cardiovascular: Normal rate, regular rhythm, normal heart sounds and intact distal pulses.   Pulmonary/Chest:  Effort normal and breath sounds normal. No respiratory distress. He has no wheezes. He has no rales. He exhibits no tenderness and no bony tenderness.  Abdominal: Soft. Normal appearance. There is no tenderness.  Musculoskeletal: Normal range of motion.  Tender left lateral neck without deformity. Mild tenderness left para-thoracic musculature; without associated deformity.  Neurological: He is alert and oriented to person, place, and time. No cranial nerve deficit or sensory deficit. He exhibits normal muscle tone. Coordination normal.  Skin: Skin is warm, dry and intact.  Psychiatric: He has a normal mood and affect. His behavior is normal. Judgment and thought content normal.    ED Course  Procedures (including critical care time) Medications  ketorolac (TORADOL) injection 30 mg (30 mg Intramuscular Given 06/06/13 0757)    Patient Vitals for the past 24 hrs:  BP Temp Pulse Resp SpO2 Height Weight  06/06/13 0800 109/64 mmHg - 89 16 97 % - -  06/06/13 0714 121/64 mmHg 97.9 F (36.6 C) 112 20 99 % 5\' 10"  (1.778 m) 185 lb (83.915 kg)        Labs Review Labs Reviewed  CBC WITH DIFFERENTIAL - Abnormal; Notable for the following:    RBC 3.94 (*)    Hemoglobin 12.3 (*)    HCT 37.3 (*)    Neutrophils Relative % 84 (*)    Lymphocytes Relative 6 (*)    Lymphs Abs 0.5 (*)    All other components within normal limits  BASIC METABOLIC PANEL - Abnormal; Notable for the following:    Glucose, Bld 104 (*)    BUN 33 (*)    Creatinine, Ser 2.15 (*)    GFR calc non Af  Amer 26 (*)    GFR calc Af Amer 31 (*)    All other components within normal limits  DIGOXIN LEVEL - Abnormal; Notable for the following:    Digoxin Level 0.4 (*)    All other components within normal limits  TROPONIN I   Imaging Review Dg Chest 2 View  06/06/2013   CLINICAL DATA:  Chest pain and neck pain.  EXAM: CHEST  2 VIEW  COMPARISON:  Chest x-ray dated 02/12/2013  FINDINGS: There is chronic cardiomegaly with  tortuosity and dilatation of the thoracic aorta. There is chronic interstitial and obstructive lung disease. No acute infiltrates or effusions. No acute osseous abnormality.  IMPRESSION: No acute abnormality. Chronic cardiomegaly. Chronic obstructive and interstitial lung disease.   Electronically Signed   By: Geanie Cooley M.D.   On: 06/06/2013 08:54   Dg Cervical Spine Complete  06/06/2013   CLINICAL DATA:  Neck pain.  EXAM: CERVICAL SPINE  4+ VIEWS  COMPARISON:  None.  FINDINGS: There is no fracture or subluxation or prevertebral soft tissue swelling. The patient has severe degenerative disc disease at C5-6 and C6-7 and to a lesser degree at C7-T1 and C3-4. There is left foraminal stenosis at C5-6. Moderate left facet arthritis at C5-6.  IMPRESSION: No acute abnormality of the cervical spine. Multilevel degenerative disc disease.   Electronically Signed   By: Geanie Cooley M.D.   On: 06/06/2013 08:57    EKG Interpretation    Date/Time:  Wednesday June 06 2013 07:16:25 EST Ventricular Rate:  112 PR Interval:    QRS Duration: 104 QT Interval:  332 QTC Calculation: 453 R Axis:   15 Text Interpretation:  Atrial fibrillation with rapid ventricular response ST \\T \ T wave abnormality, consider inferolateral ischemia Abnormal ECG since last tracing no significant change Confirmed by Xavious Sharrar  MD, Early Ord ([id number]) on 06/06/2013 8:46:25 AM            MDM   Final diagnoses:  Neck pain on left side  DJD (degenerative joint disease), cervical  Cervical myelopathy    Neck and upper chest pain, secondary to cervical degenerative joint disease with cervical myelopathy. No evidence for acute coronary syndrome, lung, infection, metabolic instability, or suspected impending vascular collapse.  Nursing Notes Reviewed/ Care Coordinated Applicable Imaging Reviewed Interpretation of Laboratory Data incorporated into ED treatment  The patient appears reasonably screened and/or stabilized for discharge  and I doubt any other medical condition or other [facility] requiring further screening, evaluation, or treatment in the ED at this time prior to discharge.  Plan: Home Medications- Prednisone; Home Treatments- rest, heat; return here if the recommended treatment, does not improve the symptoms; Recommended follow up- PCP 1 week   Flint Melter, MD 06/06/13 [phone]

## 2013-06-11 ENCOUNTER — Encounter: Payer: Self-pay | Admitting: Vascular Surgery

## 2013-06-12 ENCOUNTER — Encounter: Payer: Self-pay | Admitting: Vascular Surgery

## 2013-06-13 ENCOUNTER — Encounter: Payer: Self-pay | Admitting: Vascular Surgery

## 2013-06-13 ENCOUNTER — Ambulatory Visit
Admission: RE | Admit: 2013-06-13 | Discharge: 2013-06-13 | Disposition: A | Discharge status: Home / Self Care | Payer: Medicare Other | Source: Ambulatory Visit | Attending: Vascular Surgery | Admitting: Vascular Surgery

## 2013-06-13 ENCOUNTER — Ambulatory Visit (INDEPENDENT_AMBULATORY_CARE_PROVIDER_SITE_OTHER): Payer: Medicare Other | Admitting: Vascular Surgery

## 2013-06-13 VITALS — BP 107/64 | HR 70 | Ht 70.0 in | Wt 187.7 lb

## 2013-06-13 DIAGNOSIS — I714 Abdominal aortic aneurysm, without rupture, unspecified: Secondary | ICD-10-CM

## 2013-06-13 NOTE — Progress Notes (Signed)
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  HPI: [name] is a 80 y.o. 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 The hypogastric artery aneurysm was 5 cm in diameter. We have also been following an infrarenal aneurysm which has been stable in size. At the time of his last visit, 02/02/2012, the infrarenal aneurysm measured 4.7 cm in maximum diameter and had remained stable in size. This was by ultrasound.  Since I saw him last, he denies any history of significant abdominal pain or back pain. He has had some issues with kidney stones which are being treated.  Past Medical History  Diagnosis Date  . Hyperlipidemia   . White coat hypertension   . Cardiomyopathy      EF:30-40% in 06/1998, but 50-55% in 2009;  HX ISCHEMIC CARDIOMYOPATHY  . Chronic atrial fibrillation   . Vitamin B12 deficiency   . Chronic anticoagulation   . Arthritis   . Sensory neuropathy   . Chronic kidney disease, stage III (moderate)     creatinine 1.5 2001, 2.0 2009, 2.28 in 12/2009  . Mild renal insufficiency   . Hydronephrosis, bilateral   . Ureteral calculi     BILATERAL  . History of CHF (congestive heart failure)     1999  &  2005  . History of kidney stones   . Bilateral renal cysts   . Aneurysm of infrarenal abdominal aorta 3.9 x 3.6 no change 8/05 no change in 2010;  4.7 in 2013    MONITORED BY Tannis Burstein-- LAST VISIT OCT 2013--  4.7CM  . Chronic venous insufficiency   . PAD (peripheral artery disease)   . Edema of both legs   . History of gout     per pt stable  . Glaucoma of both eyes   . Wears hearing aid     bilateral   Family History  Problem Relation Age of Onset  . Heart disease Mother   . Early death Father   . Aneurysm Brother   . Aortic aneurysm  Brother   . Diabetes Sister    SOCIAL HISTORY: History  Substance Use Topics  . Smoking status: Never Smoker   . Smokeless tobacco: Never Used  . Alcohol Use: No   Allergies  Allergen Reactions  . Sulfa Antibiotics Rash    Nausea    Current Outpatient Prescriptions  Medication Sig Dispense Refill  . acetaminophen (TYLENOL) 500 MG tablet Take 1,000 mg by mouth every 6 (six) hours as needed for mild pain, moderate pain or headache.       Marland Kitchen amLODipine (NORVASC) 5 MG tablet Take 5 mg by mouth every morning.      Marland Kitchen COLCRYS 0.6 MG tablet Take 0.6 mg by mouth daily as needed (gout).       . Cyanocobalamin (VITAMIN B-12) 2000 MCG TBCR Take 2,000 mcg by mouth daily.       . digoxin (LANOXIN) 0.125 MG tablet Take 0.125 mg by mouth as directed. Take every day except for weekends      . doxazosin (CARDURA) 4 MG tablet Take 4 mg by mouth at bedtime.      . furosemide (LASIX) 40 MG tablet Take 40 mg by mouth every morning.      . latanoprost (XALATAN) 0.005 % ophthalmic solution Place 1  drop into both eyes at bedtime.       . metoprolol (LOPRESSOR) 100 MG tablet Take 100 mg by mouth 2 (two) times daily.      . predniSONE (DELTASONE) 20 MG tablet Take 1 tablet (20 mg total) by mouth 2 (two) times daily.  10 tablet  0  . simvastatin (ZOCOR) 20 MG tablet Take 20 mg by mouth daily with breakfast.      . warfarin (COUMADIN) 2.5 MG tablet Take 2.5 mg by mouth daily with breakfast.       . zolpidem (AMBIEN) 5 MG tablet Take 5 mg by mouth at bedtime as needed for sleep.       No current facility-administered medications for this visit.   REVIEW OF SYSTEMS: Arly.Keller ] denotes positive finding; [  ] denotes negative finding  CARDIOVASCULAR:  [ ]  chest pain   [ ]  chest pressure   [ ]  palpitations   [ ]  orthopnea   [ ]  dyspnea on exertion   [ ]  claudication   [ ]  rest pain   [ ]  DVT   [ ]  phlebitis PULMONARY:   [ ]  productive cough   [ ]  asthma   [ ]  wheezing NEUROLOGIC:   [ ]  weakness  [ ]  paresthesias  [ ]   aphasia  [ ]  amaurosis  [ ]  dizziness HEMATOLOGIC:   [ ]  bleeding problems   [ ]  clotting disorders MUSCULOSKELETAL:  [ ]  joint pain   [ ]  joint swelling [ ]  leg swelling GASTROINTESTINAL: [ ]   blood in stool  [ ]   hematemesis GENITOURINARY:  [ ]   dysuria  [ ]   hematuria PSYCHIATRIC:  [ ]  history of major depression INTEGUMENTARY:  [ ]  rashes  [ ]  ulcers CONSTITUTIONAL:  [ ]  fever   [ ]  chills  PHYSICAL EXAM: Filed Vitals:   06/13/13 1213  BP: 107/64  Pulse: 70  Height: 5\' 10"  (1.778 m)  Weight: 187 lb 11.2 oz (85.14 kg)  SpO2: 97%   Body mass index is 26.93 kg/(m^2). GENERAL: The patient is a well-nourished male, in no acute distress. The vital signs are documented above. CARDIOVASCULAR: There is a regular rate and rhythm. I do not detect carotid bruits. His aneurysm is palpable and nontender. He has palpable femoral pulses. He has no significant lower extremity swelling. PULMONARY: There is good air exchange bilaterally without wheezing or rales. ABDOMEN: Soft and non-tender with normal pitched bowel sounds.  MUSCULOSKELETAL: There are no major deformities or cyanosis. NEUROLOGIC: No focal weakness or paresthesias are detected. SKIN: There are no ulcers or rashes noted. PSYCHIATRIC: The patient has a normal affect.  DATA:  I have interpreted his CT scan. By my measurement the infrarenal aneurysm is 5.2 cm in maximum diameter. The right common iliac artery aneurysm measures 3.1 cm in maximum diameter. The hypogastric artery which has been treated is 4.8 cm and is thus stable in size.  MEDICAL ISSUES:  AAA (abdominal aortic aneurysm) His infrarenal aneurysm has enlarged slightly from 4.7 cm to 5.2 cm over the last year. I've explained that in a normal risk patient we would consider elective repair at 5.5 cm. I think he would be at increased risk because of his age however. Regardless, I've ordered a follow up ultrasound in 6 months and I'll see him back at that time. We're also  following his right common iliac artery aneurysm which is been stable in size and is 3.1 cm. The hypogastric artery aneurysm on the right his  previously been addressed and this has not changed in size.   Tywone Bembenek S [facility] [place] [facility]: [phone]

## 2013-06-13 NOTE — Assessment & Plan Note (Signed)
His infrarenal aneurysm has enlarged slightly from 4.7 cm to 5.2 cm over the last year. I've explained that in a normal risk patient we would consider elective repair at 5.5 cm. I think he would be at increased risk because of his age however. Regardless, I've ordered a follow up ultrasound in 6 months and I'll see him back at that time. We're also following his right common iliac artery aneurysm which is been stable in size and is 3.1 cm. The hypogastric artery aneurysm on the right his previously been addressed and this has not changed in size.

## 2013-06-15 NOTE — Addendum Note (Signed)
Addended by: Adria Dill L on: 06/15/2013 04:37 PM   Modules accepted: Orders

## 2013-06-18 ENCOUNTER — Other Ambulatory Visit: Payer: Self-pay | Admitting: *Deleted

## 2013-06-18 DIAGNOSIS — I714 Abdominal aortic aneurysm, without rupture, unspecified: Secondary | ICD-10-CM

## 2013-06-25 ENCOUNTER — Ambulatory Visit (INDEPENDENT_AMBULATORY_CARE_PROVIDER_SITE_OTHER): Payer: Medicare Other | Admitting: *Deleted

## 2013-06-25 DIAGNOSIS — I482 Chronic atrial fibrillation, unspecified: Secondary | ICD-10-CM

## 2013-06-25 DIAGNOSIS — Z7901 Long term (current) use of anticoagulants: Secondary | ICD-10-CM

## 2013-06-25 DIAGNOSIS — Z5181 Encounter for therapeutic drug level monitoring: Secondary | ICD-10-CM

## 2013-06-25 DIAGNOSIS — I4891 Unspecified atrial fibrillation: Secondary | ICD-10-CM

## 2013-06-25 LAB — POCT INR: INR: 3.6

## 2013-07-02 ENCOUNTER — Other Ambulatory Visit (HOSPITAL_COMMUNITY): Payer: Self-pay | Admitting: Urology

## 2013-07-02 ENCOUNTER — Ambulatory Visit (HOSPITAL_COMMUNITY)
Admission: RE | Admit: 2013-07-02 | Discharge: 2013-07-02 | Disposition: A | Discharge status: Home / Self Care | Payer: Medicare Other | Source: Ambulatory Visit | Attending: Urology | Admitting: Urology

## 2013-07-02 DIAGNOSIS — N135 Crossing vessel and stricture of ureter without hydronephrosis: Secondary | ICD-10-CM

## 2013-07-02 DIAGNOSIS — N1330 Unspecified hydronephrosis: Secondary | ICD-10-CM | POA: Insufficient documentation

## 2013-07-02 DIAGNOSIS — Z436 Encounter for attention to other artificial openings of urinary tract: Secondary | ICD-10-CM | POA: Insufficient documentation

## 2013-07-02 MED ORDER — LIDOCAINE HCL 1 % IJ SOLN
INTRAMUSCULAR | Status: AC
  Filled 2013-07-02: qty 20

## 2013-07-02 MED ORDER — IOHEXOL 300 MG/ML  SOLN
20.0000 mL | Freq: Once | INTRAMUSCULAR | Status: AC | PRN
  Administered 2013-07-02: 20 mL via INTRATHECAL

## 2013-07-02 NOTE — Procedures (Signed)
Successful exchange of the right nephrostomy tube.

## 2013-07-04 ENCOUNTER — Ambulatory Visit (INDEPENDENT_AMBULATORY_CARE_PROVIDER_SITE_OTHER): Payer: Medicare Other | Admitting: Cardiology

## 2013-07-04 ENCOUNTER — Encounter: Payer: Self-pay | Admitting: Cardiology

## 2013-07-04 VITALS — BP 112/71 | HR 77 | Ht 70.0 in | Wt 186.0 lb

## 2013-07-04 DIAGNOSIS — I4891 Unspecified atrial fibrillation: Secondary | ICD-10-CM

## 2013-07-04 DIAGNOSIS — I714 Abdominal aortic aneurysm, without rupture, unspecified: Secondary | ICD-10-CM

## 2013-07-04 DIAGNOSIS — I428 Other cardiomyopathies: Secondary | ICD-10-CM

## 2013-07-04 DIAGNOSIS — I429 Cardiomyopathy, unspecified: Secondary | ICD-10-CM

## 2013-07-04 MED ORDER — DIGOXIN 125 MCG PO TABS: ORAL_TABLET | ORAL | Status: DC

## 2013-07-04 NOTE — Patient Instructions (Signed)
Your physician recommends that you schedule a follow-up appointment in: 6 months with Dr Lurena Joiner will receive a reminder letter two months in advance reminding you to call and schedule your appointment. If you don't receive this letter, please contact our office.  Your physician has recommended you make the following change in your medication:  Take Digoxin 0.125 every other day

## 2013-07-04 NOTE — Progress Notes (Signed)
Clinical Summary [name] is a 80 y.o.male seen today for follow up of the following medical problems.   1. Afib  - denies any palpitations  - followed in coumadin clinic here, compliant. Denies any bleeding issues  2. Cardiomyopathy  - prior LVEF 30-40% in 06/1998, LVEF normalized in 2009 LVEF 50-55%  - occas SOB he relates to his sinuses. Denies any LE edema, no orthopnea  3. Kidney stones/Hydronephrosis/CKD - recurrent issues with kidney stones and hydronephrosis, followed by urology.  - repeat procedure 07/02/13 with succesful exchange of nephrostomy tube  4. Neck pain - recent ER visit 05/2013 with neck pain radiating to clavicle present for several days, worst with movement. No chest pain. Resolved with course of steroids.   6. Right hypogastric artery aneurysm, AAA, right common iliac artery aneurysm - all followed by vascular Dr Edilia Bo - repair previously by vascular, recent f/u with  Vascular - recent US shows enlargement of AAA to 4.7x5.2, vascular has ordered repeat US in 6 months   Past Medical History  Diagnosis Date  . Hyperlipidemia   . White coat hypertension   . Cardiomyopathy      EF:30-40% in 06/1998, but 50-55% in 2009;  HX ISCHEMIC CARDIOMYOPATHY  . Chronic atrial fibrillation   . Vitamin B12 deficiency   . Chronic anticoagulation   . Arthritis   . Sensory neuropathy   . Chronic kidney disease, stage III (moderate)     creatinine 1.5 2001, 2.0 2009, 2.28 in 12/2009  . Mild renal insufficiency   . Hydronephrosis, bilateral   . Ureteral calculi     BILATERAL  . History of CHF (congestive heart failure)     1999  &  2005  . History of kidney stones   . Bilateral renal cysts   . 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 Chronic venous insufficiency   . PAD (peripheral artery disease)   . Edema of both legs   . History of gout     per  pt stable  . Glaucoma of both eyes   . Wears hearing aid     bilateral     Allergies  Allergen Reactions  . Sulfa Antibiotics Rash    Nausea      Current Outpatient Prescriptions  Medication Sig Dispense Refill  . acetaminophen (TYLENOL) 500 MG tablet Take 1,000 mg by mouth every 6 (six) hours as needed for mild pain, moderate pain or headache.       Marland Kitchen amLODipine (NORVASC) 5 MG tablet Take 5 mg by mouth every morning.      Marland Kitchen COLCRYS 0.6 MG tablet Take 0.6 mg by mouth daily as needed (gout).       . Cyanocobalamin (VITAMIN B-12) 2000 MCG TBCR Take 2,000 mcg by mouth daily.       . digoxin (LANOXIN) 0.125 MG tablet Take 0.125 mg by mouth as directed. Take every day except for weekends      . doxazosin (CARDURA) 4 MG tablet Take 4 mg by mouth at bedtime.      . furosemide (LASIX) 40 MG tablet Take 40 mg by mouth every morning.      . latanoprost (XALATAN) 0.005 % ophthalmic solution Place 1 drop into both eyes at bedtime.       . metoprolol (LOPRESSOR) 100 MG tablet Take 100 mg  by mouth 2 (two) times daily.      . predniSONE (DELTASONE) 20 MG tablet Take 1 tablet (20 mg total) by mouth 2 (two) times daily.  10 tablet  0  . simvastatin (ZOCOR) 20 MG tablet Take 20 mg by mouth daily with breakfast.      . warfarin (COUMADIN) 2.5 MG tablet Take 2.5 mg by mouth daily with breakfast.       . zolpidem (AMBIEN) 5 MG tablet Take 5 mg by mouth at bedtime as needed for sleep.       No current facility-administered medications for this visit.     Past Surgical History  Procedure Laterality Date  . Repair thoracic aorta  01/1995    s/p rupture  . Cataract extraction w/ intraocular lens  implant, bilateral  2012  . 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 Laparoscopic cholecystectomy  JAN 2005  . Percutaneous nephrostolithotomy  1970's  . Abdominal aortagram  07-16-2003  DR ROTHBART    W/ COIL EMBOLIZATION OF SIX BRANCHES OF RIGHT  INTERNAL RENAL ARTERY ANEURYSM  . Cardiovascular stress test  04-25-2003    LOW RISK CARDIOLITE STUDY/ MODERATE LV DILATATION AND IMPAIRMENT OF LVSF/  NO ISCHEMIA  . Transthoracic echocardiogram  02-07-2008  DR ROTHBART    MODERATE DILATED LV/  EF 50-55%/ MILD AR/ MILD TO MODERATE AORTIC ROOT DILATATION/ MODERATE MR/ MODERATE LA   &  RA   DIILATED/ MILD TO MODERATE TR  . Cystoscopy w/ ureteral stent placement Bilateral 10/24/2012    Procedure: CYSTOSCOPY WITH RETROGRADE PYELOGRAM/URETERAL STENT PLACEMENT;  Surgeon: Lindaann SloughMarc-Henry Nesi, MD;  [facility];  Service: Urology;  Laterality: Bilateral;  . Cystoscopy with ureteroscopy Left 11/27/2012    Procedure: CYSTOSCOPY,LEFT URETEROSCOPY WITH LASER LITHOTRIPSY/REMOVAL OF MIGRATED STENT, PLACEMENT OF LEFT STENT;  Surgeon: Garnett FarmMark C Ottelin, MD;  Location: [facility];  Service: Urology;  Laterality: Left;  . Holmium laser application Left 11/27/2012    Procedure: HOLMIUM LASER APPLICATION;  Surgeon: Garnett FarmMark C Ottelin, MD;  Location: [facility];  Service: Urology;  Laterality: Left;  . Cystoscopy with ureteroscopy and stent placement N/A 12/29/2012    Procedure: CYSTOSCOPY WITH URETEROSCOPY AND STENT PLACEMENT, removal of right and left stents, retrogrades, right stent exchange;  Surgeon: Garnett FarmMark C Ottelin, MD;  Location: [facility];  Service: Urology;  Laterality: N/A;  . Holmium laser application Right 12/29/2012    Procedure: HOLMIUM LASER APPLICATION;  Surgeon: Garnett FarmMark C Ottelin, MD;  Location: [facility];  Service: Urology;  Laterality: Right;  HLL OF (RT) URETERAL PELVIC JUNCTION STONE  . Cystoscopy with retrograde pyelogram, ureteroscopy and stent placement Right 01/29/2013    Procedure: CYSTOSCOPY WITH RIGHT URETEROSCOPY AND STENT PLACEMENT;  Surgeon: Garnett FarmMark C Ottelin, MD;  Location: [facility];  Service: Urology;  Laterality: Right;  . Holmium laser application N/A 01/29/2013    Procedure: HOLMIUM LASER APPLICATION;  Surgeon: Garnett FarmMark C Ottelin, MD;  Location: [facility];   Service: Urology;  Laterality: N/A;  . Esophagogastroduodenoscopy N/A 02/16/2013    Procedure: ESOPHAGOGASTRODUODENOSCOPY (EGD);  Surgeon: Meryl DareMalcolm T Stark, MD;  [facility];  Service: Endoscopy;  Laterality: N/A;  . Cystoscopy with retrograde pyelogram, ureteroscopy and stent placement Right 05/07/2013    Procedure: CYSTOSCOPY WITH RIGHT RETROGRADE PYELOGRAM, Balloon dilation of right ureter, Laser incision of right ureter, Nephrostogram;  Surgeon: Garnett FarmMark C Ottelin, MD;  Location: [facility];  Service: Urology;  Laterality: Right;  . Holmium laser application Right 05/07/2013  Procedure: HOLMIUM LASER APPLICATION;  Surgeon: Garnett Farm, MD;  Location: [facility];  Service: Urology;  Laterality: Right;     Allergies  Allergen Reactions  . Sulfa Antibiotics Rash    Nausea       Family History  Problem Relation Age of Onset  . Heart disease Mother   . Early death Father   . Aneurysm Brother   . Aortic aneurysm Brother   . Diabetes Sister      Social History Mr. Abruzzese reports that he has never smoked. He has never used smokeless tobacco. Mr. Aubry reports that he does not drink alcohol.   Review of Systems CONSTITUTIONAL: No weight loss, fever, chills, weakness or fatigue.  HEENT: Eyes: No visual loss, blurred vision, double vision or yellow sclerae.No hearing loss, sneezing, congestion, runny nose or sore throat.  SKIN: No rash or itching.  CARDIOVASCULAR: per HPI RESPIRATORY: No shortness of breath, cough or sputum.  GASTROINTESTINAL: No anorexia, nausea, vomiting or diarrhea. No abdominal pain or blood.  GENITOURINARY: No burning on urination, no polyuria NEUROLOGICAL: No headache, dizziness, syncope, paralysis, ataxia, numbness or tingling in the extremities. No change in bowel or bladder control.  MUSCULOSKELETAL: No muscle, back pain, joint pain or stiffness.  LYMPHATICS: No enlarged nodes. No history of splenectomy.  PSYCHIATRIC: No history of depression or  anxiety.  ENDOCRINOLOGIC: No reports of sweating, cold or heat intolerance. No polyuria or polydipsia.  Marland Kitchen   Physical Examination p 77 bp 112/71 Wt 186 lbs BMI 27 Gen: resting comfortably, no acute distress HEENT: no scleral icterus, pupils equal round and reactive, no palptable cervical adenopathy,  CV: irreg, 2/6 systolic murmur at apex, no JVD, no carotid bruits Resp: Clear to auscultation bilaterally GI: abdomen is soft, non-tender, non-distended, normal bowel sounds, no hepatosplenomegaly MSK: extremities are warm, no edema.  Skin: warm, no rash Neuro:  no focal deficits Psych: appropriate affect   Diagnostic Studies 01/2012 AAA [place]  Aneurysm dilatation of the mid to distal abdominal aorta 4.7 cm  20.7x12.7x15.6 cm cystic structure anterior to the abdominal aorta     Assessment and Plan  1. Afib  - no current symptoms  - continue rate control and coumadin - based on renal function change digoxin to 0.125mg  every other day  2. Cardiomyopathy  - now with normalized LVEF - denies any current symptoms - continue to follow clinically  3. Arterial aneurysms - continue regular follow up with vascular       Antoine Poche, M.D., F.A.C.C.

## 2013-07-12 ENCOUNTER — Telehealth: Payer: Self-pay | Admitting: Cardiology

## 2013-07-12 ENCOUNTER — Other Ambulatory Visit: Payer: Self-pay | Admitting: Family Medicine

## 2013-07-12 MED ORDER — DIGOXIN 125 MCG PO TABS: ORAL_TABLET | ORAL | Status: DC

## 2013-07-12 NOTE — Telephone Encounter (Signed)
Ok plus five ref 

## 2013-07-12 NOTE — Telephone Encounter (Signed)
Needs RX for Digoxin sent to [facility].  90 day supply. / tgs

## 2013-07-12 NOTE — Telephone Encounter (Signed)
90 day supply lanoxin to [facility] completed by e-scribe

## 2013-07-16 ENCOUNTER — Ambulatory Visit (INDEPENDENT_AMBULATORY_CARE_PROVIDER_SITE_OTHER): Payer: Medicare Other | Admitting: *Deleted

## 2013-07-16 DIAGNOSIS — I4891 Unspecified atrial fibrillation: Secondary | ICD-10-CM

## 2013-07-16 DIAGNOSIS — I482 Chronic atrial fibrillation, unspecified: Secondary | ICD-10-CM

## 2013-07-16 DIAGNOSIS — Z5181 Encounter for therapeutic drug level monitoring: Secondary | ICD-10-CM

## 2013-07-16 DIAGNOSIS — Z7901 Long term (current) use of anticoagulants: Secondary | ICD-10-CM

## 2013-07-16 LAB — POCT INR: INR: 2.7

## 2013-07-17 ENCOUNTER — Telehealth: Payer: Self-pay | Admitting: Cardiology

## 2013-07-17 NOTE — Telephone Encounter (Signed)
Please see refill bin / tgs  °

## 2013-07-17 NOTE — Telephone Encounter (Signed)
Prescription faxed

## 2013-07-18 ENCOUNTER — Encounter: Payer: Self-pay | Admitting: Family Medicine

## 2013-07-18 ENCOUNTER — Ambulatory Visit (INDEPENDENT_AMBULATORY_CARE_PROVIDER_SITE_OTHER): Payer: Medicare Other | Admitting: Family Medicine

## 2013-07-18 VITALS — BP 118/68 | Ht 70.0 in | Wt 193.0 lb

## 2013-07-18 DIAGNOSIS — M109 Gout, unspecified: Secondary | ICD-10-CM

## 2013-07-18 DIAGNOSIS — I4891 Unspecified atrial fibrillation: Secondary | ICD-10-CM

## 2013-07-18 DIAGNOSIS — IMO0001 Reserved for inherently not codable concepts without codable children: Secondary | ICD-10-CM

## 2013-07-18 DIAGNOSIS — Z7901 Long term (current) use of anticoagulants: Secondary | ICD-10-CM

## 2013-07-18 DIAGNOSIS — I482 Chronic atrial fibrillation, unspecified: Secondary | ICD-10-CM

## 2013-07-18 DIAGNOSIS — Z79899 Other long term (current) drug therapy: Secondary | ICD-10-CM

## 2013-07-18 DIAGNOSIS — I10 Essential (primary) hypertension: Secondary | ICD-10-CM

## 2013-07-18 DIAGNOSIS — E785 Hyperlipidemia, unspecified: Secondary | ICD-10-CM

## 2013-07-18 MED ORDER — ZOLPIDEM TARTRATE 5 MG PO TABS: ORAL_TABLET | ORAL | Status: DC

## 2013-07-18 MED ORDER — TRAMADOL HCL 50 MG PO TABS: 50.0000 mg | ORAL_TABLET | Freq: Every day | ORAL | Status: DC | PRN

## 2013-07-18 NOTE — Progress Notes (Signed)
   Subjective:    Patient ID: [name], male    DOB: [birth year], 80 y.o.   MRN: [id number]  HPI Patient is here today for a check up.  He would like a refill on his Ambien. Notes ongoing challenges with sleep. Feels that the ambulance definitely helps him.  He would like to discuss getting a pain medicine for his arthritis and gout, PRN.  He said  Dr. Briant Cedar (his kidney doctor) prescribed him a pain medicine, and he would like a refill on it. Patient was given narcotics immediately after his surgery.    recurent stents and needing to keep one  Helped arthritis also  Patient claims compliance with cholesterol medicine. No obvious side effects. Watching his diet in this regard.  Patient notes compliance with blood pressure medication. No obvious side effects from this. Has cut his salt intake down. When his blood pressures evaluated numbers generally are in good control. Review of Systems No headache no chest pain no back pain other than arthritis no abdominal pain no change in bowel habits    Objective:   Physical Exam  Alert no acute distress. Vitals stable. Blood pressure good. HEENT normal. Lungs clear. Heart rhythm irregular but in good control. Low back no obvious tenderness to percussion negative straight leg raise hands positive changes of osteoarthritis. Ankles no significant edema      Assessment & Plan:  Impression 1 hypertension good control. #2 hyperlipidemia control uncertain. #3 arthritis progressive. Patient states Tylenol doesn't help and cannot take ibuprofen or similar due to Coumadin. He would like something stronger #4 insomnia ongoing. Plan Ambien refill. Trial of tramadol 50 mg when necessary for pain. Drowsy precautions given. Maintain other medications. Followup as scheduled. WSL

## 2013-07-19 LAB — HEPATIC FUNCTION PANEL
ALK PHOS: 68 U/L (ref 39–117)
ALT: <8 U/L (ref 0–53)
AST: 11 U/L (ref 0–37)
Albumin: 3.5 g/dL (ref 3.5–5.2)
BILIRUBIN INDIRECT: 0.5 mg/dL (ref 0.2–1.2)
Bilirubin, Direct: 0.2 mg/dL (ref 0.0–0.3)
Total Bilirubin: 0.7 mg/dL (ref 0.2–1.2)
Total Protein: 6.2 g/dL (ref 6.0–8.3)

## 2013-07-19 LAB — LIPID PANEL
CHOLESTEROL: 110 mg/dL (ref 0–200)
HDL: 33 mg/dL — ABNORMAL LOW (ref >39)
LDL Cholesterol: 62 mg/dL (ref 0–99)
Total CHOL/HDL Ratio: 3.3 Ratio
Triglycerides: 74 mg/dL (ref <150)
VLDL: 15 mg/dL (ref 0–40)

## 2013-08-07 ENCOUNTER — Ambulatory Visit (INDEPENDENT_AMBULATORY_CARE_PROVIDER_SITE_OTHER): Payer: Medicare Other | Admitting: *Deleted

## 2013-08-07 DIAGNOSIS — H6120 Impacted cerumen, unspecified ear: Secondary | ICD-10-CM

## 2013-08-07 NOTE — Progress Notes (Signed)
   Subjective:    Patient ID: [name], male    DOB: 07/22/1925, 80 y.o.   MRN: [id number]  HPI  Bilateral ear irrigation without difficulty.  Review of Systems     Objective:   Physical Exam        Assessment & Plan:

## 2013-08-13 ENCOUNTER — Ambulatory Visit (INDEPENDENT_AMBULATORY_CARE_PROVIDER_SITE_OTHER): Payer: Medicare Other | Admitting: *Deleted

## 2013-08-13 DIAGNOSIS — I4891 Unspecified atrial fibrillation: Secondary | ICD-10-CM

## 2013-08-13 DIAGNOSIS — Z7901 Long term (current) use of anticoagulants: Secondary | ICD-10-CM

## 2013-08-13 DIAGNOSIS — I482 Chronic atrial fibrillation, unspecified: Secondary | ICD-10-CM

## 2013-08-13 DIAGNOSIS — Z5181 Encounter for therapeutic drug level monitoring: Secondary | ICD-10-CM

## 2013-08-13 LAB — POCT INR: INR: 2.9

## 2013-08-23 ENCOUNTER — Telehealth: Payer: Self-pay | Admitting: Family Medicine

## 2013-08-23 MED ORDER — ZOLPIDEM TARTRATE 5 MG PO TABS: ORAL_TABLET | ORAL | Status: DC

## 2013-08-23 NOTE — Telephone Encounter (Signed)
Dr. Soyla Dryer pt. Was seen on 3/25. 6 month refill was sent to [facility], but patient said it would be $100 for a 3 month supply, so he canceled it. Pt would like rx sent to [facility] b/c they only charge $10/month.

## 2013-08-23 NOTE — Telephone Encounter (Signed)
May send a 30 day prescription with 4 refills.

## 2013-08-23 NOTE — Telephone Encounter (Signed)
[facility] , Script has been faxed to [facility].

## 2013-08-23 NOTE — Telephone Encounter (Signed)
Patient is requesting a refill of his zolpidem to [facility]. His past refills he had left has expired.

## 2013-08-27 ENCOUNTER — Other Ambulatory Visit (HOSPITAL_COMMUNITY): Payer: Self-pay | Admitting: Urology

## 2013-08-27 ENCOUNTER — Ambulatory Visit (HOSPITAL_COMMUNITY)
Admission: RE | Admit: 2013-08-27 | Discharge: 2013-08-27 | Disposition: A | Discharge status: Home / Self Care | Payer: Medicare Other | Source: Ambulatory Visit | Attending: Urology | Admitting: Urology

## 2013-08-27 DIAGNOSIS — N135 Crossing vessel and stricture of ureter without hydronephrosis: Secondary | ICD-10-CM

## 2013-08-27 DIAGNOSIS — Z436 Encounter for attention to other artificial openings of urinary tract: Secondary | ICD-10-CM | POA: Insufficient documentation

## 2013-08-27 DIAGNOSIS — N179 Acute kidney failure, unspecified: Secondary | ICD-10-CM | POA: Insufficient documentation

## 2013-08-27 DIAGNOSIS — N1330 Unspecified hydronephrosis: Secondary | ICD-10-CM | POA: Insufficient documentation

## 2013-08-27 MED ORDER — IOHEXOL 300 MG/ML  SOLN
20.0000 mL | Freq: Once | INTRAMUSCULAR | Status: AC | PRN
  Administered 2013-08-27: 10 mL

## 2013-08-27 MED ORDER — LIDOCAINE HCL 1 % IJ SOLN
INTRAMUSCULAR | Status: AC
  Filled 2013-08-27: qty 20

## 2013-08-27 NOTE — Procedures (Signed)
RIGHT percutaneous nephrostomy exchange under fluoro No complication No blood loss. See complete dictation in [facility] PACS.  

## 2013-09-10 ENCOUNTER — Ambulatory Visit (INDEPENDENT_AMBULATORY_CARE_PROVIDER_SITE_OTHER): Payer: Medicare Other | Admitting: *Deleted

## 2013-09-10 DIAGNOSIS — I4891 Unspecified atrial fibrillation: Secondary | ICD-10-CM

## 2013-09-10 DIAGNOSIS — Z7901 Long term (current) use of anticoagulants: Secondary | ICD-10-CM

## 2013-09-10 DIAGNOSIS — Z5181 Encounter for therapeutic drug level monitoring: Secondary | ICD-10-CM

## 2013-09-10 DIAGNOSIS — I482 Chronic atrial fibrillation, unspecified: Secondary | ICD-10-CM

## 2013-09-10 LAB — POCT INR: INR: 2.6

## 2013-10-22 ENCOUNTER — Other Ambulatory Visit (HOSPITAL_COMMUNITY): Payer: Self-pay | Admitting: Urology

## 2013-10-22 ENCOUNTER — Ambulatory Visit (HOSPITAL_COMMUNITY)
Admission: RE | Admit: 2013-10-22 | Discharge: 2013-10-22 | Disposition: A | Discharge status: Home / Self Care | Payer: Medicare Other | Source: Ambulatory Visit | Attending: Urology | Admitting: Urology

## 2013-10-22 DIAGNOSIS — N135 Crossing vessel and stricture of ureter without hydronephrosis: Secondary | ICD-10-CM

## 2013-10-22 DIAGNOSIS — Z436 Encounter for attention to other artificial openings of urinary tract: Secondary | ICD-10-CM | POA: Insufficient documentation

## 2013-10-22 DIAGNOSIS — N1330 Unspecified hydronephrosis: Secondary | ICD-10-CM | POA: Insufficient documentation

## 2013-10-22 MED ORDER — LIDOCAINE HCL 1 % IJ SOLN
INTRAMUSCULAR | Status: AC
  Filled 2013-10-22: qty 20

## 2013-10-22 MED ORDER — IOHEXOL 300 MG/ML  SOLN
10.0000 mL | Freq: Once | INTRAMUSCULAR | Status: AC | PRN
  Administered 2013-10-22: 10 mL

## 2013-10-22 NOTE — Procedures (Signed)
Successful exchange of the right nephrostomy tube.  No immediate complication.

## 2013-10-24 ENCOUNTER — Ambulatory Visit (INDEPENDENT_AMBULATORY_CARE_PROVIDER_SITE_OTHER): Payer: Medicare Other | Admitting: *Deleted

## 2013-10-24 DIAGNOSIS — I482 Chronic atrial fibrillation, unspecified: Secondary | ICD-10-CM

## 2013-10-24 DIAGNOSIS — Z5181 Encounter for therapeutic drug level monitoring: Secondary | ICD-10-CM

## 2013-10-24 DIAGNOSIS — Z7901 Long term (current) use of anticoagulants: Secondary | ICD-10-CM

## 2013-10-24 DIAGNOSIS — I4891 Unspecified atrial fibrillation: Secondary | ICD-10-CM

## 2013-10-24 LAB — POCT INR: INR: 1.9

## 2013-11-19 ENCOUNTER — Ambulatory Visit (INDEPENDENT_AMBULATORY_CARE_PROVIDER_SITE_OTHER): Payer: Medicare Other | Admitting: Family Medicine

## 2013-11-19 ENCOUNTER — Encounter: Payer: Self-pay | Admitting: Family Medicine

## 2013-11-19 VITALS — BP 110/68 | Ht 70.0 in | Wt 188.8 lb

## 2013-11-19 DIAGNOSIS — Z7901 Long term (current) use of anticoagulants: Secondary | ICD-10-CM

## 2013-11-19 DIAGNOSIS — E782 Mixed hyperlipidemia: Secondary | ICD-10-CM

## 2013-11-19 DIAGNOSIS — R030 Elevated blood-pressure reading, without diagnosis of hypertension: Secondary | ICD-10-CM

## 2013-11-19 DIAGNOSIS — E785 Hyperlipidemia, unspecified: Secondary | ICD-10-CM

## 2013-11-19 DIAGNOSIS — IMO0001 Reserved for inherently not codable concepts without codable children: Secondary | ICD-10-CM

## 2013-11-19 DIAGNOSIS — Z79899 Other long term (current) drug therapy: Secondary | ICD-10-CM

## 2013-11-19 LAB — LIPID PANEL
CHOLESTEROL: 118 mg/dL (ref 0–200)
HDL: 39 mg/dL — ABNORMAL LOW (ref >39)
LDL Cholesterol: 61 mg/dL (ref 0–99)
Total CHOL/HDL Ratio: 3 Ratio
Triglycerides: 89 mg/dL (ref <150)
VLDL: 18 mg/dL (ref 0–40)

## 2013-11-19 LAB — BASIC METABOLIC PANEL
BUN: 49 mg/dL — AB (ref 6–23)
CO2: 26 mEq/L (ref 19–32)
Calcium: 9.1 mg/dL (ref 8.4–10.5)
Chloride: 105 mEq/L (ref 96–112)
Creat: 3.44 mg/dL — ABNORMAL HIGH (ref 0.50–1.35)
GLUCOSE: 98 mg/dL (ref 70–99)
POTASSIUM: 5.8 meq/L — AB (ref 3.5–5.3)
Sodium: 141 mEq/L (ref 135–145)

## 2013-11-19 LAB — HEPATIC FUNCTION PANEL
ALT: 9 U/L (ref 0–53)
AST: 12 U/L (ref 0–37)
Albumin: 4 g/dL (ref 3.5–5.2)
Alkaline Phosphatase: 68 U/L (ref 39–117)
BILIRUBIN DIRECT: 0.2 mg/dL (ref 0.0–0.3)
Indirect Bilirubin: 0.4 mg/dL (ref 0.2–1.2)
Total Bilirubin: 0.6 mg/dL (ref 0.2–1.2)
Total Protein: 7.1 g/dL (ref 6.0–8.3)

## 2013-11-19 NOTE — Progress Notes (Signed)
   Subjective:    Patient ID: [name], male    DOB: 06/15/1925, 80 y.o.   MRN: [id number]  Hyperlipidemia This is a chronic problem. The current episode started more than 1 year ago. Current antihyperlipidemic treatment includes statins. There are no compliance problems.  Risk factors for coronary artery disease include dyslipidemia.    Patient states his right ear roars all the time and he thinks he needs to see ENT. Feels like a gush of air when breathing. Wonders if he may have a ruptured eardrum.  Took the Ultram for pain. States it did not help. In fact cause side effects. Has stopped already.  Stays active with house work and yard work  [employer] uro regularly  [employer] card regularly for a fib, watches his coum level closely    Review of Systems No chest pain no headache no back pain no abdominal pain no change in bowel habits    Objective:   Physical Exam Alert no acute distress H&T normal. Lungs clear. Heart rhythm irregular but good control. Ankles without edema.       Assessment & Plan:  Impression 1 hypertension good control. #2 hyperlipidemia status uncertain. #3 chronic pain/arthritis discussed patient not ready to press on with narcotics yet. Plan stop Ultram. Appropriate blood work. Diet exercise discussed. Check every 6 months. Continue to followup with specialist. WSL

## 2013-11-21 ENCOUNTER — Ambulatory Visit (INDEPENDENT_AMBULATORY_CARE_PROVIDER_SITE_OTHER): Payer: Medicare Other | Admitting: *Deleted

## 2013-11-21 DIAGNOSIS — Z5181 Encounter for therapeutic drug level monitoring: Secondary | ICD-10-CM

## 2013-11-21 DIAGNOSIS — Z7901 Long term (current) use of anticoagulants: Secondary | ICD-10-CM

## 2013-11-21 DIAGNOSIS — I4891 Unspecified atrial fibrillation: Secondary | ICD-10-CM

## 2013-11-21 DIAGNOSIS — I482 Chronic atrial fibrillation, unspecified: Secondary | ICD-10-CM

## 2013-11-21 LAB — POCT INR: INR: 2.6

## 2013-11-22 ENCOUNTER — Encounter: Payer: Self-pay | Admitting: Family Medicine

## 2013-11-28 ENCOUNTER — Other Ambulatory Visit: Payer: Self-pay | Admitting: Family Medicine

## 2013-12-11 ENCOUNTER — Encounter: Payer: Self-pay | Admitting: Vascular Surgery

## 2013-12-12 ENCOUNTER — Encounter: Payer: Self-pay | Admitting: Vascular Surgery

## 2013-12-12 ENCOUNTER — Ambulatory Visit (INDEPENDENT_AMBULATORY_CARE_PROVIDER_SITE_OTHER): Payer: Medicare Other | Admitting: Vascular Surgery

## 2013-12-12 ENCOUNTER — Ambulatory Visit (HOSPITAL_COMMUNITY)
Admission: RE | Admit: 2013-12-12 | Discharge: 2013-12-12 | Disposition: A | Discharge status: Home / Self Care | Payer: Medicare Other | Source: Ambulatory Visit | Attending: Vascular Surgery | Admitting: Vascular Surgery

## 2013-12-12 VITALS — BP 111/71 | HR 79 | Ht 70.0 in | Wt 187.6 lb

## 2013-12-12 DIAGNOSIS — I723 Aneurysm of iliac artery: Secondary | ICD-10-CM | POA: Insufficient documentation

## 2013-12-12 DIAGNOSIS — I714 Abdominal aortic aneurysm, without rupture, unspecified: Secondary | ICD-10-CM

## 2013-12-12 DIAGNOSIS — Z48812 Encounter for surgical aftercare following surgery on the circulatory system: Secondary | ICD-10-CM | POA: Insufficient documentation

## 2013-12-12 DIAGNOSIS — I708 Atherosclerosis of other arteries: Secondary | ICD-10-CM | POA: Diagnosis not present

## 2013-12-12 NOTE — Progress Notes (Signed)
[facility] [place]  Patient name: Edward AlstromJack D Gasper MRN: [id number] DOB: 25-Oct-1925 Sex: male  REASON FOR VISIT: Follow up of abdominal aortic aneurysm  HPI: [name] is a 80 y.o. male who comes in for a 6 month follow up. Since I saw him last, he denies any history of abdominal pain or back pain. His only complaint has been some issues with his years after he had somewhat lax cleaned out from one of them. He is not a smoker. His blood pressure has been under good control.   Past Medical History  Diagnosis Date  . Hyperlipidemia   . White coat hypertension   . Cardiomyopathy      EF:30-40% in 06/1998, but 50-55% in 2009;  HX ISCHEMIC CARDIOMYOPATHY  . Chronic atrial fibrillation   . Vitamin B12 deficiency   . Chronic anticoagulation   . Arthritis   . Sensory neuropathy   . Chronic kidney disease, stage III (moderate)     creatinine 1.5 2001, 2.0 2009, 2.28 in 12/2009  . Mild renal insufficiency   . Hydronephrosis, bilateral   . Ureteral calculi     BILATERAL  . History of CHF (congestive heart failure)     1999  &  2005  . History of kidney stones   . Bilateral renal cysts   . Aneurysm of infrarenal abdominal aorta 3.9 x 3.6 no change 8/05 no change in 2010;  4.7 in 2013    MONITORED BY Faiza Bansal-- LAST VISIT OCT 2013--  4.7CM  . Chronic venous insufficiency   . PAD (peripheral artery disease)   . Edema of both legs   . History of gout     per pt stable  . Glaucoma of both eyes   . Wears hearing aid     bilateral   Family History  Problem Relation Age of Onset  . Heart disease Mother   . Early death Father   . Aneurysm Brother   . Aortic aneurysm Brother   . Diabetes Sister    SOCIAL HISTORY: History  Substance Use Topics  . Smoking status: Never Smoker   . Smokeless tobacco: Never Used  . Alcohol Use: No   Allergies  Allergen Reactions  . Sulfa Antibiotics Rash    Nausea    Current Outpatient Prescriptions  Medication Sig Dispense  Refill  . acetaminophen (TYLENOL) 500 MG tablet Take 1,000 mg by mouth every 6 (six) hours as needed for mild pain, moderate pain or headache.       Marland Kitchen. amLODipine (NORVASC) 5 MG tablet Take 5 mg by mouth every morning.      Marland Kitchen. COLCRYS 0.6 MG tablet TAKE ONE TABLET BY MOUTH 2 TIMES A DAY FOR GOUT.  24 tablet  0  . Cyanocobalamin (VITAMIN B-12) 2000 MCG TBCR Take 2,000 mcg by mouth daily.       . digoxin (LANOXIN) 0.125 MG tablet Take 1 tablet every other day.  90 tablet  3  . doxazosin (CARDURA) 4 MG tablet Take 4 mg by mouth at bedtime.      . furosemide (LASIX) 40 MG tablet Take 40 mg by mouth every morning.      . latanoprost (XALATAN) 0.005 % ophthalmic solution Place 1 drop into both eyes at bedtime.       . metoprolol (LOPRESSOR) 100 MG tablet Take 100 mg by mouth 2 (two) times daily.      . simvastatin (ZOCOR) 20 MG tablet Take 20 mg by mouth  daily with breakfast.      . traMADol (ULTRAM) 50 MG tablet Take 1 tablet (50 mg total) by mouth daily as needed (Arthritis pain).  30 tablet  3  . warfarin (COUMADIN) 2.5 MG tablet Take 2.5 mg by mouth daily with breakfast.       . zolpidem (AMBIEN) 5 MG tablet TAKE 1 TABLET AT BEDTIME AS NEEDED FOR SLEEP.  30 tablet  4   No current facility-administered medications for this visit.   REVIEW OF SYSTEMS: Arly.Keller ] denotes positive finding; [  ] denotes negative finding  CARDIOVASCULAR:  [ ]  chest pain   [ ]  chest pressure   [ ]  palpitations   [ ]  orthopnea   [ ]  dyspnea on exertion   Arly.Keller ] claudication   [ ]  rest pain   Arly.Keller ] DVT   [ ]  phlebitis PULMONARY:   [ ]  productive cough   [ ]  asthma   [ ]  wheezing NEUROLOGIC:   Arly.Keller ] weakness  [ ]  paresthesias  [ ]  aphasia  [ ]  amaurosis  [ ]  dizziness HEMATOLOGIC:   [ ]  bleeding problems   [ ]  clotting disorders MUSCULOSKELETAL:  [ ]  joint pain   [ ]  joint swelling [ ]  leg swelling GASTROINTESTINAL: [ ]   blood in stool  [ ]   hematemesis GENITOURINARY:  [ ]   dysuria  [ ]   hematuria PSYCHIATRIC:  [ ]  history of  major depression INTEGUMENTARY:  [ ]  rashes  [ ]  ulcers CONSTITUTIONAL:  [ ]  fever   [ ]  chills  PHYSICAL EXAM: Filed Vitals:   12/12/13 1001  BP: 111/71  Pulse: 79  Height: 5\' 10"  (1.778 m)  Weight: 187 lb 9.6 oz (85.095 kg)  SpO2: 96%   Body mass index is 26.92 kg/(m^2). GENERAL: The patient is a well-nourished male, in no acute distress. The vital signs are documented above. CARDIOVASCULAR: There is a regular rate and rhythm. I do not detect carotid bruits. PULMONARY: There is good air exchange bilaterally without wheezing or rales. ABDOMEN: Soft and non-tender with normal pitched bowel sounds. His aneurysm is palpable and nontender. MUSCULOSKELETAL: There are no major deformities or cyanosis. NEUROLOGIC: No focal weakness or paresthesias are detected. SKIN: There are no ulcers or rashes noted. PSYCHIATRIC: The patient has a normal affect.  DATA:  I have independently interpreted his ultrasound of his aorta which shows that the maximum diameter of his infrarenal aorta is 5.2 cm. This has not changed since his last study in February. The right common iliac artery measures 3.1 cm in maximum diameter. The left common iliac artery measures 1.35 cm in maximum diameter.  MEDICAL ISSUES:  AAA (abdominal aortic aneurysm) The patient has a stable 5.2 cm infrarenal abdominal aortic aneurysm. He is almost 80 years old. I've explained that in a normal risk patient we would consider elective repair at 5.5 cm. Given his age, and somewhat debilitated state, I would probably wait even longer in his case. Regardless, the aneurysm has remained stable in size. I have ordered a follow up ultrasound in 6 months and I will see him back at that time. Fortunately he is not a smoker. His blood pressure has been under good control.    Return in about 6 months (around 06/14/2014).  Jaydalee Bardwell S [facility] [place] Beeper: [phone]

## 2013-12-12 NOTE — Assessment & Plan Note (Signed)
The patient has a stable 5.2 cm infrarenal abdominal aortic aneurysm. He is almost 80 years old. I've explained that in a normal risk patient we would consider elective repair at 5.5 cm. Given his age, and somewhat debilitated state, I would probably wait even longer in his case. Regardless, the aneurysm has remained stable in size. I have ordered a follow up ultrasound in 6 months and I will see him back at that time. Fortunately he is not a smoker. His blood pressure has been under good control.

## 2013-12-17 ENCOUNTER — Other Ambulatory Visit (HOSPITAL_COMMUNITY): Payer: Self-pay | Admitting: Urology

## 2013-12-17 ENCOUNTER — Ambulatory Visit (HOSPITAL_COMMUNITY)
Admission: RE | Admit: 2013-12-17 | Discharge: 2013-12-17 | Disposition: A | Discharge status: Home / Self Care | Payer: Medicare Other | Source: Ambulatory Visit | Attending: Urology | Admitting: Urology

## 2013-12-17 DIAGNOSIS — N135 Crossing vessel and stricture of ureter without hydronephrosis: Secondary | ICD-10-CM

## 2013-12-17 DIAGNOSIS — Z436 Encounter for attention to other artificial openings of urinary tract: Secondary | ICD-10-CM | POA: Insufficient documentation

## 2013-12-17 MED ORDER — IOHEXOL 300 MG/ML  SOLN
20.0000 mL | Freq: Once | INTRAMUSCULAR | Status: AC | PRN
  Administered 2013-12-17: 20 mL

## 2013-12-19 ENCOUNTER — Ambulatory Visit (INDEPENDENT_AMBULATORY_CARE_PROVIDER_SITE_OTHER): Payer: Medicare Other | Admitting: *Deleted

## 2013-12-19 DIAGNOSIS — I482 Chronic atrial fibrillation, unspecified: Secondary | ICD-10-CM

## 2013-12-19 DIAGNOSIS — Z5181 Encounter for therapeutic drug level monitoring: Secondary | ICD-10-CM

## 2013-12-19 DIAGNOSIS — Z7901 Long term (current) use of anticoagulants: Secondary | ICD-10-CM

## 2013-12-19 DIAGNOSIS — I4891 Unspecified atrial fibrillation: Secondary | ICD-10-CM

## 2013-12-19 LAB — POCT INR: INR: 2.7

## 2013-12-24 ENCOUNTER — Telehealth: Payer: Self-pay | Admitting: Family Medicine

## 2013-12-24 MED ORDER — COLCHICINE 0.6 MG PO TABS: ORAL_TABLET | ORAL | Status: AC

## 2013-12-24 NOTE — Telephone Encounter (Signed)
Ok one yrs worth 

## 2013-12-24 NOTE — Telephone Encounter (Signed)
Rx sent electronically to pharmacy. Patient notified. 

## 2013-12-24 NOTE — Telephone Encounter (Signed)
Patient needs Rx for COLCRYS 0.6 MG tablet   [facility]

## 2014-01-09 ENCOUNTER — Other Ambulatory Visit: Payer: Self-pay | Admitting: Cardiology

## 2014-01-09 NOTE — Telephone Encounter (Signed)
Medication sent to pharmacy  

## 2014-01-16 ENCOUNTER — Ambulatory Visit (INDEPENDENT_AMBULATORY_CARE_PROVIDER_SITE_OTHER): Payer: Medicare Other | Admitting: *Deleted

## 2014-01-16 DIAGNOSIS — Z5181 Encounter for therapeutic drug level monitoring: Secondary | ICD-10-CM

## 2014-01-16 DIAGNOSIS — Z7901 Long term (current) use of anticoagulants: Secondary | ICD-10-CM

## 2014-01-16 DIAGNOSIS — I4891 Unspecified atrial fibrillation: Secondary | ICD-10-CM

## 2014-01-16 DIAGNOSIS — I482 Chronic atrial fibrillation, unspecified: Secondary | ICD-10-CM

## 2014-01-16 LAB — POCT INR: INR: 3.1

## 2014-01-24 ENCOUNTER — Encounter: Payer: Self-pay | Admitting: Cardiology

## 2014-01-24 ENCOUNTER — Ambulatory Visit (INDEPENDENT_AMBULATORY_CARE_PROVIDER_SITE_OTHER): Payer: Medicare Other | Admitting: Cardiology

## 2014-01-24 VITALS — BP 118/64 | HR 77 | Ht 70.0 in | Wt 173.0 lb

## 2014-01-24 DIAGNOSIS — I4891 Unspecified atrial fibrillation: Secondary | ICD-10-CM

## 2014-01-24 DIAGNOSIS — I10 Essential (primary) hypertension: Secondary | ICD-10-CM

## 2014-01-24 DIAGNOSIS — E785 Hyperlipidemia, unspecified: Secondary | ICD-10-CM

## 2014-01-24 NOTE — Progress Notes (Signed)
Clinical Summary Mr. Tworek is a 80 y.o.male seen today for follow up of the following medical problems.   1. Afib  - denies any palpitations  - followed in coumadin clinic here, compliant. Denies any bleeding issues   2. History of cardiomyopathy  - prior LVEF 30-40% in 06/1998, LVEF normalized in 2009 LVEF 50-55%  - Denies any DOE. Denies any LE edema, no orthopnea   3. Kidney stones/Hydronephrosis/CKD  - recurrent issues with kidney stones and hydronephrosis, followed by urology.  - repeat procedure 07/02/13 with succesful exchange of nephrostomy tube   4. Right hypogastric artery aneurysm, AAA, right common iliac artery aneurysm  - all followed by vascular Dr Edilia Bo, AAA has been stable  5. Hyperlipidemia - compliant with statin - 10/2013 TC 118 TG 89 HDL 39 LDL 61  6. HTN - does not check at home  - compliant with meds  Past Medical History  Diagnosis Date  . Hyperlipidemia   . White coat hypertension   . Cardiomyopathy      EF:30-40% in 06/1998, but 50-55% in 2009;  HX ISCHEMIC CARDIOMYOPATHY  . Chronic atrial fibrillation   . Vitamin B12 deficiency   . Chronic anticoagulation   . Arthritis   . Sensory neuropathy   . Chronic kidney disease, stage III (moderate)     creatinine 1.5 2001, 2.0 2009, 2.28 in 12/2009  . Mild renal insufficiency   . Hydronephrosis, bilateral   . Ureteral calculi     BILATERAL  . History of CHF (congestive heart failure)     1999  &  2005  . History of kidney stones   . Bilateral renal cysts   . 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 Chronic venous insufficiency   . PAD (peripheral artery disease)   . Edema of both legs   . History of gout     per pt stable  . Glaucoma of both eyes   . Wears hearing aid     bilateral     Allergies  Allergen Reactions  . Sulfa Antibiotics Rash    Nausea      Current  Outpatient Prescriptions  Medication Sig Dispense Refill  . acetaminophen (TYLENOL) 500 MG tablet Take 1,000 mg by mouth every 6 (six) hours as needed for mild pain, moderate pain or headache.       Marland Kitchen amLODipine (NORVASC) 5 MG tablet Take 5 mg by mouth every morning.      . colchicine (COLCRYS) 0.6 MG tablet TAKE ONE TABLET BY MOUTH 2 TIMES A DAY FOR GOUT.  24 tablet  11  . Cyanocobalamin (VITAMIN B-12) 2000 MCG TBCR Take 2,000 mcg by mouth daily.       . digoxin (LANOXIN) 0.125 MG tablet Take 1 tablet every other day.  90 tablet  3  . doxazosin (CARDURA) 4 MG tablet Take 4 mg by mouth at bedtime.      . furosemide (LASIX) 40 MG tablet Take 40 mg by mouth every morning.      . latanoprost (XALATAN) 0.005 % ophthalmic solution Place 1 drop into both eyes at bedtime.       . metoprolol (LOPRESSOR) 100 MG tablet Take 100 mg by mouth 2 (two) times daily.      . metoprolol (LOPRESSOR) 100 MG tablet Take 1 tablet by mouth two  times daily  60 tablet  1  . simvastatin (ZOCOR) 20 MG tablet Take 20 mg by mouth daily with breakfast.      . traMADol (ULTRAM) 50 MG tablet Take 1 tablet (50 mg total) by mouth daily as needed (Arthritis pain).  30 tablet  3  . warfarin (COUMADIN) 5 MG tablet Take 1 tablet by mouth as   directed  30 tablet  3  . zolpidem (AMBIEN) 5 MG tablet TAKE 1 TABLET AT BEDTIME AS NEEDED FOR SLEEP.  30 tablet  4   No current facility-administered medications for this visit.     Past Surgical History  Procedure Laterality Date  . Repair thoracic aorta  01/1995    s/p rupture  . Cataract extraction w/ intraocular lens  implant, bilateral  2012  . 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 Laparoscopic cholecystectomy  JAN 2005  . Percutaneous nephrostolithotomy  1970's  . Abdominal aortagram  07-16-2003  DR ROTHBART    W/ COIL EMBOLIZATION OF SIX BRANCHES OF RIGHT INTERNAL RENAL ARTERY ANEURYSM  . Cardiovascular stress test   04-25-2003    LOW RISK CARDIOLITE STUDY/ MODERATE LV DILATATION AND IMPAIRMENT OF LVSF/  NO ISCHEMIA  . Transthoracic echocardiogram  02-07-2008  DR ROTHBART    MODERATE DILATED LV/  EF 50-55%/ MILD AR/ MILD TO MODERATE AORTIC ROOT DILATATION/ MODERATE MR/ MODERATE LA   &  RA   DIILATED/ MILD TO MODERATE TR  . Cystoscopy w/ ureteral stent placement Bilateral 10/24/2012    Procedure: CYSTOSCOPY WITH RETROGRADE PYELOGRAM/URETERAL STENT PLACEMENT;  Surgeon: Lindaann SloughMarc-Henry Nesi, MD;  [facility];  Service: Urology;  Laterality: Bilateral;  . Cystoscopy with ureteroscopy Left 11/27/2012    Procedure: CYSTOSCOPY,LEFT URETEROSCOPY WITH LASER LITHOTRIPSY/REMOVAL OF MIGRATED STENT, PLACEMENT OF LEFT STENT;  Surgeon: Garnett FarmMark C Ottelin, MD;  Location: [facility];  Service: Urology;  Laterality: Left;  . Holmium laser application Left 11/27/2012    Procedure: HOLMIUM LASER APPLICATION;  Surgeon: Garnett FarmMark C Ottelin, MD;  Location: [facility];  Service: Urology;  Laterality: Left;  . Cystoscopy with ureteroscopy and stent placement N/A 12/29/2012    Procedure: CYSTOSCOPY WITH URETEROSCOPY AND STENT PLACEMENT, removal of right and left stents, retrogrades, right stent exchange;  Surgeon: Garnett FarmMark C Ottelin, MD;  Location: [facility];  Service: Urology;  Laterality: N/A;  . Holmium laser application Right 12/29/2012    Procedure: HOLMIUM LASER APPLICATION;  Surgeon: Garnett FarmMark C Ottelin, MD;  Location: [facility];  Service: Urology;  Laterality: Right;  HLL OF (RT) URETERAL PELVIC JUNCTION STONE  . Cystoscopy with retrograde pyelogram, ureteroscopy and stent placement Right 01/29/2013    Procedure: CYSTOSCOPY WITH RIGHT URETEROSCOPY AND STENT PLACEMENT;  Surgeon: Garnett FarmMark C Ottelin, MD;  Location: [facility];  Service: Urology;  Laterality: Right;  . Holmium laser application N/A 01/29/2013    Procedure: HOLMIUM LASER APPLICATION;  Surgeon: Garnett FarmMark C Ottelin, MD;  Location: [facility];  Service: Urology;  Laterality: N/A;  . Esophagogastroduodenoscopy  N/A 02/16/2013    Procedure: ESOPHAGOGASTRODUODENOSCOPY (EGD);  Surgeon: Meryl DareMalcolm T Stark, MD;  [facility];  Service: Endoscopy;  Laterality: N/A;  . Cystoscopy with retrograde pyelogram, ureteroscopy and stent placement Right 05/07/2013    Procedure: CYSTOSCOPY WITH RIGHT RETROGRADE PYELOGRAM, Balloon dilation of right ureter, Laser incision of right ureter, Nephrostogram;  Surgeon: Garnett FarmMark C Ottelin, MD;  Location: [facility];  Service: Urology;  Laterality: Right;  . Holmium laser application Right 05/07/2013  Procedure: HOLMIUM LASER APPLICATION;  Surgeon: Garnett Farm, MD;  Location: [facility];  Service: Urology;  Laterality: Right;     Allergies  Allergen Reactions  . Sulfa Antibiotics Rash    Nausea       Family History  Problem Relation Age of Onset  . Heart disease Mother   . Early death Father   . Aneurysm Brother   . Aortic aneurysm Brother   . Diabetes Sister      Social History Mr. Boger reports that he has never smoked. He has never used smokeless tobacco. Mr. Litt reports that he does not drink alcohol.   Review of Systems CONSTITUTIONAL: No weight loss, fever, chills, weakness or fatigue.  HEENT: Eyes: No visual loss, blurred vision, double vision or yellow sclerae.No hearing loss, sneezing, congestion, runny nose or sore throat.  SKIN: No rash or itching.  CARDIOVASCULAR: per HPI RESPIRATORY: No shortness of breath, cough or sputum.  GASTROINTESTINAL: No anorexia, nausea, vomiting or diarrhea. No abdominal pain or blood.  GENITOURINARY: No burning on urination, no polyuria NEUROLOGICAL: No headache, dizziness, syncope, paralysis, ataxia, numbness or tingling in the extremities. No change in bowel or bladder control.  MUSCULOSKELETAL: No muscle, back pain, joint pain or stiffness.  LYMPHATICS: No enlarged nodes. No history of splenectomy.  PSYCHIATRIC: No history of depression or anxiety.  ENDOCRINOLOGIC: No reports of sweating, cold or heat  intolerance. No polyuria or polydipsia.  Marland Kitchen   Physical Examination p 77 bp 118/64 Wt 173 lbs BMI 25 Gen: resting comfortably, no acute distress HEENT: no scleral icterus, pupils equal round and reactive, no palptable cervical adenopathy,  CV: RRR, no m/r/g, no jVD, no carotid bruits Resp: Clear to auscultation bilaterally GI: abdomen is soft, non-tender, non-distended, normal bowel sounds, no hepatosplenomegaly MSK: extremities are warm, no edema.  Skin: warm, no rash Neuro:  no focal deficits Psych: appropriate affect   Diagnostic Studies 01/2012 AAA [place]  Aneurysm dilatation of the mid to distal abdominal aorta 4.7 cm  20.7x12.7x15.6 cm cystic structure anterior to the abdominal aorta     Assessment and Plan  1. Afib  - no current symptoms  - continue rate control and coumadin   2. History of Cardiomyopathy  - now with normalized LVEF  - denies any current symptoms  - continue to follow clinically   3. Arterial aneurysms  - continue regular follow up with vascular  4. HTN - at goal, continue current meds  5. Hyperlipidemia - at goal, continue current statin       Antoine Poche, M.D.

## 2014-01-24 NOTE — Patient Instructions (Signed)
Your physician wants you to follow-up in: 1 year You will receive a reminder letter in the mail two months in advance. If you don't receive a letter, please call our office to schedule the follow-up appointment.    Your physician recommends that you continue on your current medications as directed. Please refer to the Current Medication list given to you today.     Thank you for choosing [facility] !  

## 2014-01-30 ENCOUNTER — Other Ambulatory Visit: Payer: Self-pay | Admitting: Family Medicine

## 2014-01-30 NOTE — Telephone Encounter (Signed)
6 mo ok 

## 2014-02-01 ENCOUNTER — Other Ambulatory Visit: Payer: Self-pay | Admitting: Family Medicine

## 2014-02-04 NOTE — Telephone Encounter (Signed)
Ok 6 mo 

## 2014-02-08 ENCOUNTER — Other Ambulatory Visit: Payer: Self-pay

## 2014-02-11 ENCOUNTER — Ambulatory Visit (HOSPITAL_COMMUNITY)
Admission: RE | Admit: 2014-02-11 | Discharge: 2014-02-11 | Disposition: A | Discharge status: Home / Self Care | Payer: Medicare Other | Source: Ambulatory Visit | Attending: Urology | Admitting: Urology

## 2014-02-11 ENCOUNTER — Other Ambulatory Visit (HOSPITAL_COMMUNITY): Payer: Self-pay | Admitting: Urology

## 2014-02-11 DIAGNOSIS — N135 Crossing vessel and stricture of ureter without hydronephrosis: Secondary | ICD-10-CM | POA: Diagnosis present

## 2014-02-11 DIAGNOSIS — Z436 Encounter for attention to other artificial openings of urinary tract: Secondary | ICD-10-CM | POA: Insufficient documentation

## 2014-02-11 MED ORDER — IOHEXOL 300 MG/ML  SOLN
10.0000 mL | Freq: Once | INTRAMUSCULAR | Status: AC | PRN
  Administered 2014-02-11: 10 mL

## 2014-02-13 ENCOUNTER — Ambulatory Visit (INDEPENDENT_AMBULATORY_CARE_PROVIDER_SITE_OTHER): Payer: Medicare Other | Admitting: *Deleted

## 2014-02-13 DIAGNOSIS — I482 Chronic atrial fibrillation, unspecified: Secondary | ICD-10-CM

## 2014-02-13 DIAGNOSIS — Z5181 Encounter for therapeutic drug level monitoring: Secondary | ICD-10-CM

## 2014-02-13 DIAGNOSIS — Z23 Encounter for immunization: Secondary | ICD-10-CM

## 2014-02-13 DIAGNOSIS — Z7901 Long term (current) use of anticoagulants: Secondary | ICD-10-CM

## 2014-02-13 DIAGNOSIS — I4891 Unspecified atrial fibrillation: Secondary | ICD-10-CM

## 2014-02-13 LAB — POCT INR: INR: 3.9

## 2014-03-04 ENCOUNTER — Ambulatory Visit (INDEPENDENT_AMBULATORY_CARE_PROVIDER_SITE_OTHER): Payer: Medicare Other | Admitting: *Deleted

## 2014-03-04 DIAGNOSIS — I482 Chronic atrial fibrillation, unspecified: Secondary | ICD-10-CM

## 2014-03-04 DIAGNOSIS — Z7901 Long term (current) use of anticoagulants: Secondary | ICD-10-CM

## 2014-03-04 DIAGNOSIS — Z5181 Encounter for therapeutic drug level monitoring: Secondary | ICD-10-CM

## 2014-03-04 DIAGNOSIS — I4891 Unspecified atrial fibrillation: Secondary | ICD-10-CM

## 2014-03-04 LAB — POCT INR: INR: 1.5

## 2014-03-19 ENCOUNTER — Other Ambulatory Visit: Payer: Self-pay | Admitting: Cardiology

## 2014-03-25 ENCOUNTER — Ambulatory Visit (INDEPENDENT_AMBULATORY_CARE_PROVIDER_SITE_OTHER): Payer: Medicare Other | Admitting: *Deleted

## 2014-03-25 DIAGNOSIS — Z5181 Encounter for therapeutic drug level monitoring: Secondary | ICD-10-CM

## 2014-03-25 DIAGNOSIS — I4891 Unspecified atrial fibrillation: Secondary | ICD-10-CM

## 2014-03-25 DIAGNOSIS — I482 Chronic atrial fibrillation, unspecified: Secondary | ICD-10-CM

## 2014-03-25 DIAGNOSIS — Z7901 Long term (current) use of anticoagulants: Secondary | ICD-10-CM

## 2014-03-25 LAB — POCT INR: INR: 4.3

## 2014-03-26 ENCOUNTER — Other Ambulatory Visit: Payer: Self-pay | Admitting: Cardiology

## 2014-03-26 MED ORDER — SIMVASTATIN 20 MG PO TABS: 20.0000 mg | ORAL_TABLET | Freq: Every day | ORAL | Status: DC

## 2014-03-26 NOTE — Telephone Encounter (Signed)
Sig changed to zocor at dinner time,refilled rx

## 2014-03-26 NOTE — Telephone Encounter (Signed)
Please see refill bin / tgs  °

## 2014-04-08 ENCOUNTER — Ambulatory Visit (HOSPITAL_COMMUNITY)
Admission: RE | Admit: 2014-04-08 | Discharge: 2014-04-08 | Disposition: A | Discharge status: Home / Self Care | Payer: Medicare Other | Source: Ambulatory Visit | Attending: Urology | Admitting: Urology

## 2014-04-08 ENCOUNTER — Other Ambulatory Visit (HOSPITAL_COMMUNITY): Payer: Self-pay | Admitting: Urology

## 2014-04-08 DIAGNOSIS — N135 Crossing vessel and stricture of ureter without hydronephrosis: Secondary | ICD-10-CM | POA: Insufficient documentation

## 2014-04-08 MED ORDER — IOHEXOL 300 MG/ML  SOLN
20.0000 mL | Freq: Once | INTRAMUSCULAR | Status: AC | PRN
  Administered 2014-04-08: 20 mL

## 2014-04-08 MED ORDER — LIDOCAINE HCL 1 % IJ SOLN
INTRAMUSCULAR | Status: AC
  Filled 2014-04-08: qty 20

## 2014-04-08 NOTE — Procedures (Signed)
Procedure:  Right percutaneous nephrostomy tube change Findings:  New 10 Fr catheter formed in lower pole collecting system.

## 2014-04-09 ENCOUNTER — Emergency Department (HOSPITAL_COMMUNITY)
Admission: EM | Admit: 2014-04-09 | Discharge: 2014-04-09 | Disposition: A | Discharge status: Home / Self Care | Payer: Medicare Other | Attending: Emergency Medicine | Admitting: Emergency Medicine

## 2014-04-09 ENCOUNTER — Encounter (HOSPITAL_COMMUNITY): Payer: Self-pay | Admitting: Emergency Medicine

## 2014-04-09 ENCOUNTER — Emergency Department (HOSPITAL_COMMUNITY): Payer: Medicare Other

## 2014-04-09 DIAGNOSIS — Z974 Presence of external hearing-aid: Secondary | ICD-10-CM | POA: Diagnosis not present

## 2014-04-09 DIAGNOSIS — M1990 Unspecified osteoarthritis, unspecified site: Secondary | ICD-10-CM | POA: Diagnosis not present

## 2014-04-09 DIAGNOSIS — F5102 Adjustment insomnia: Secondary | ICD-10-CM | POA: Insufficient documentation

## 2014-04-09 DIAGNOSIS — M109 Gout, unspecified: Secondary | ICD-10-CM | POA: Insufficient documentation

## 2014-04-09 DIAGNOSIS — Q6102 Congenital multiple renal cysts: Secondary | ICD-10-CM | POA: Insufficient documentation

## 2014-04-09 DIAGNOSIS — Z9889 Other specified postprocedural states: Secondary | ICD-10-CM | POA: Diagnosis not present

## 2014-04-09 DIAGNOSIS — Z87442 Personal history of urinary calculi: Secondary | ICD-10-CM | POA: Insufficient documentation

## 2014-04-09 DIAGNOSIS — E538 Deficiency of other specified B group vitamins: Secondary | ICD-10-CM | POA: Diagnosis not present

## 2014-04-09 DIAGNOSIS — Z7901 Long term (current) use of anticoagulants: Secondary | ICD-10-CM | POA: Diagnosis not present

## 2014-04-09 DIAGNOSIS — H409 Unspecified glaucoma: Secondary | ICD-10-CM | POA: Insufficient documentation

## 2014-04-09 DIAGNOSIS — N189 Chronic kidney disease, unspecified: Secondary | ICD-10-CM

## 2014-04-09 DIAGNOSIS — Z79899 Other long term (current) drug therapy: Secondary | ICD-10-CM | POA: Diagnosis not present

## 2014-04-09 DIAGNOSIS — I482 Chronic atrial fibrillation: Secondary | ICD-10-CM | POA: Diagnosis not present

## 2014-04-09 DIAGNOSIS — I5023 Acute on chronic systolic (congestive) heart failure: Secondary | ICD-10-CM | POA: Insufficient documentation

## 2014-04-09 DIAGNOSIS — R062 Wheezing: Secondary | ICD-10-CM | POA: Insufficient documentation

## 2014-04-09 DIAGNOSIS — E785 Hyperlipidemia, unspecified: Secondary | ICD-10-CM | POA: Diagnosis not present

## 2014-04-09 DIAGNOSIS — G4700 Insomnia, unspecified: Secondary | ICD-10-CM

## 2014-04-09 DIAGNOSIS — N183 Chronic kidney disease, stage 3 (moderate): Secondary | ICD-10-CM | POA: Insufficient documentation

## 2014-04-09 DIAGNOSIS — R0602 Shortness of breath: Secondary | ICD-10-CM | POA: Diagnosis present

## 2014-04-09 LAB — CBC WITH DIFFERENTIAL/PLATELET
BASOS ABS: 0 10*3/uL (ref 0.0–0.1)
BASOS PCT: 0 % (ref 0–1)
EOS PCT: 2 % (ref 0–5)
Eosinophils Absolute: 0.1 10*3/uL (ref 0.0–0.7)
HCT: 36.1 % — ABNORMAL LOW (ref 39.0–52.0)
Hemoglobin: 11.6 g/dL — ABNORMAL LOW (ref 13.0–17.0)
LYMPHS PCT: 12 % (ref 12–46)
Lymphs Abs: 0.7 10*3/uL (ref 0.7–4.0)
MCH: 31.4 pg (ref 26.0–34.0)
MCHC: 32.1 g/dL (ref 30.0–36.0)
MCV: 97.6 fL (ref 78.0–100.0)
Monocytes Absolute: 0.7 10*3/uL (ref 0.1–1.0)
Monocytes Relative: 13 % — ABNORMAL HIGH (ref 3–12)
Neutro Abs: 4 10*3/uL (ref 1.7–7.7)
Neutrophils Relative %: 73 % (ref 43–77)
PLATELETS: 138 10*3/uL — AB (ref 150–400)
RBC: 3.7 MIL/uL — ABNORMAL LOW (ref 4.22–5.81)
RDW: 16.2 % — ABNORMAL HIGH (ref 11.5–15.5)
WBC: 5.5 10*3/uL (ref 4.0–10.5)

## 2014-04-09 LAB — TROPONIN I

## 2014-04-09 LAB — COMPREHENSIVE METABOLIC PANEL
ALBUMIN: 3.8 g/dL (ref 3.5–5.2)
ALT: 8 U/L (ref 0–53)
AST: 14 U/L (ref 0–37)
Alkaline Phosphatase: 69 U/L (ref 39–117)
Anion gap: 15 (ref 5–15)
BUN: 45 mg/dL — ABNORMAL HIGH (ref 6–23)
CALCIUM: 8.9 mg/dL (ref 8.4–10.5)
CO2: 22 meq/L (ref 19–32)
CREATININE: 3.28 mg/dL — AB (ref 0.50–1.35)
Chloride: 105 mEq/L (ref 96–112)
GFR calc Af Amer: 18 mL/min — ABNORMAL LOW (ref >90)
GFR calc non Af Amer: 16 mL/min — ABNORMAL LOW (ref >90)
Glucose, Bld: 94 mg/dL (ref 70–99)
Potassium: 5.5 mEq/L — ABNORMAL HIGH (ref 3.7–5.3)
SODIUM: 142 meq/L (ref 137–147)
TOTAL PROTEIN: 7.2 g/dL (ref 6.0–8.3)
Total Bilirubin: 0.7 mg/dL (ref 0.3–1.2)

## 2014-04-09 LAB — PRO B NATRIURETIC PEPTIDE: PRO B NATRI PEPTIDE: 9656 pg/mL — AB (ref 0–450)

## 2014-04-09 LAB — PROTIME-INR
INR: 2.82 — AB (ref 0.00–1.49)
Prothrombin Time: 29.9 seconds — ABNORMAL HIGH (ref 11.6–15.2)

## 2014-04-09 NOTE — ED Notes (Signed)
Pt c/o feeling sob "for a while", worsening last night. Denies cp. Pt alert, oriented, speaking complete sentences in triage. O2 sats 97% on ra.

## 2014-04-09 NOTE — Discharge Instructions (Signed)
You should take 40mg  of lasix in the morning and 40mg  at night for the next 3 days - call your heart doctor to let them know about this change and to arrange close follow up - you can try taking 10mg  of ambien at bed to get better sleep.  Return to the ER for worsening shortness of breath or swelling.  Please call your doctor for a followup appointment within 24-48 hours. When you talk to your doctor please let them know that you were seen in the emergency department and have them acquire all of your records so that they can discuss the findings with you and formulate a treatment plan to fully care for your new and ongoing problems.

## 2014-04-09 NOTE — ED Provider Notes (Signed)
CSN: [id number]     Arrival date & time 04/09/14  8295 History  This chart was scribed for Vida Roller, MD by Tonye Royalty, ED Scribe. This patient was seen in room APA18/APA18 and the patient's care was started at 10:18 AM.    Chief Complaint  Patient presents with  . Shortness of Breath   The history is provided by the patient. No language interpreter was used.    Symptoms were gradual in onset Symptoms are persistent Symptoms are improved with sitting up Made worse with laying down Associated symptoms include leg swelling, difficulty swallowing   HPI Comments: [name] is a 80 y.o. male who presents to the Emergency Department complaining of gradual onset of trouble breathing this past week.Marland Kitchen   He takes generic 5 mg ambien.  Has difficulty breathing when laying flat and seems to get better when he is upright. He has mild difficulty swallowing un-chewed food. He also notes that he hears a "hurricane sound" when breathing through his nose. The sound is not present when he breathes through his mouth. Notes swelling in his ankle in the past 3-4 weeks.  Has been taking a fluid pill for a long period of time. Take 2.5 milligrams of coumadin a day.  Has never smoked.  Had heart surgery in 1996.    He denies coughing or fever, he also complains of some insomnia at night not because of orthopnea but because he wakes up and can go back to sleep. He has been using Ambien 5 mg  Past Medical History  Diagnosis Date  . Hyperlipidemia   . White coat hypertension   . Cardiomyopathy      EF:30-40% in 06/1998, but 50-55% in 2009;  HX ISCHEMIC CARDIOMYOPATHY  . Chronic atrial fibrillation   . Vitamin B12 deficiency   . Chronic anticoagulation   . Arthritis   . Sensory neuropathy   . Chronic kidney disease, stage III (moderate)     creatinine 1.5 2001, 2.0 2009, 2.28 in 12/2009  . Mild renal insufficiency   . Hydronephrosis, bilateral   . Ureteral calculi     BILATERAL  . History of CHF  (congestive heart failure)     1999  &  2005  . History of kidney stones   . Bilateral renal cysts   . 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 Chronic venous insufficiency   . PAD (peripheral artery disease)   . Edema of both legs   . History of gout     per pt stable  . Glaucoma of both eyes   . Wears hearing aid     bilateral   Past Surgical History  Procedure Laterality Date  . Repair thoracic aorta  01/1995    s/p rupture  . Cataract extraction w/ intraocular lens  implant, bilateral  2012  . 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 Laparoscopic cholecystectomy  JAN 2005  . Percutaneous nephrostolithotomy  1970's  . Abdominal aortagram  07-16-2003  DR ROTHBART    W/ COIL EMBOLIZATION OF SIX BRANCHES OF RIGHT INTERNAL RENAL ARTERY ANEURYSM  . Cardiovascular stress test  04-25-2003    LOW RISK CARDIOLITE STUDY/ MODERATE LV DILATATION AND IMPAIRMENT OF LVSF/  NO ISCHEMIA  . Transthoracic echocardiogram  02-07-2008  DR Dietrich Pates  MODERATE DILATED LV/  EF 50-55%/ MILD AR/ MILD TO MODERATE AORTIC ROOT DILATATION/ MODERATE MR/ MODERATE LA   &  RA   DIILATED/ MILD TO MODERATE TR  . Cystoscopy w/ ureteral stent placement Bilateral 10/24/2012    Procedure: CYSTOSCOPY WITH RETROGRADE PYELOGRAM/URETERAL STENT PLACEMENT;  Surgeon: Lindaann Slough, MD;  Location: [facility] [place];  Service: Urology;  Laterality: Bilateral;  . Cystoscopy with ureteroscopy Left 11/27/2012    Procedure: CYSTOSCOPY,LEFT URETEROSCOPY WITH LASER LITHOTRIPSY/REMOVAL OF MIGRATED STENT, PLACEMENT OF LEFT STENT;  Surgeon: Garnett Farm, MD;  Location: [facility];  Service: Urology;  Laterality: Left;  . Holmium laser application Left 11/27/2012    Procedure: HOLMIUM LASER APPLICATION;  Surgeon: Garnett Farm, MD;  Location: [facility];  Service: Urology;   Laterality: Left;  . Cystoscopy with ureteroscopy and stent placement N/A 12/29/2012    Procedure: CYSTOSCOPY WITH URETEROSCOPY AND STENT PLACEMENT, removal of right and left stents, retrogrades, right stent exchange;  Surgeon: Garnett Farm, MD;  Location: [facility];  Service: Urology;  Laterality: N/A;  . Holmium laser application Right 12/29/2012    Procedure: HOLMIUM LASER APPLICATION;  Surgeon: Garnett Farm, MD;  Location: [facility];  Service: Urology;  Laterality: Right;  HLL OF (RT) URETERAL PELVIC JUNCTION STONE  . Cystoscopy with retrograde pyelogram, ureteroscopy and stent placement Right 01/29/2013    Procedure: CYSTOSCOPY WITH RIGHT URETEROSCOPY AND STENT PLACEMENT;  Surgeon: Garnett Farm, MD;  Location: [facility];  Service: Urology;  Laterality: Right;  . Holmium laser application N/A 01/29/2013    Procedure: HOLMIUM LASER APPLICATION;  Surgeon: Garnett Farm, MD;  Location: [facility];  Service: Urology;  Laterality: N/A;  . Esophagogastroduodenoscopy N/A 02/16/2013    Procedure: ESOPHAGOGASTRODUODENOSCOPY (EGD);  Surgeon: Meryl Dare, MD;  Location: [facility];  Service: Endoscopy;  Laterality: N/A;  . Cystoscopy with retrograde pyelogram, ureteroscopy and stent placement Right 05/07/2013    Procedure: CYSTOSCOPY WITH RIGHT RETROGRADE PYELOGRAM, Balloon dilation of right ureter, Laser incision of right ureter, Nephrostogram;  Surgeon: Garnett Farm, MD;  Location: [facility];  Service: Urology;  Laterality: Right;  . Holmium laser application Right 05/07/2013    Procedure: HOLMIUM LASER APPLICATION;  Surgeon: Garnett Farm, MD;  Location: [facility];  Service: Urology;  Laterality: Right;   Family History  Problem Relation Age of Onset  . Heart disease Mother   . Early death Father   . Aneurysm Brother   . Aortic aneurysm Brother   . Diabetes Sister    History  Substance Use Topics  . Smoking status: Never Smoker   . Smokeless tobacco: Never Used  . Alcohol Use: No    Review of Systems   HENT: Positive for trouble swallowing.   Respiratory: Positive for shortness of breath.   Cardiovascular: Positive for leg swelling.  All other systems reviewed and are negative.     Allergies  Sulfa antibiotics  Home Medications   Prior to Admission medications   Medication Sig Start Date End Date Taking? Authorizing Provider  acetaminophen (TYLENOL) 500 MG tablet Take 1,000 mg by mouth every 6 (six) hours as needed for mild pain, moderate pain or headache.    Yes Historical Provider, MD  amLODipine (NORVASC) 5 MG tablet Take 5 mg by mouth every morning. 04/27/13  Yes Antoine Poche, MD  amLODipine (NORVASC) 5 MG tablet Take 1 tablet by mouth  every morning 03/19/14  Yes Antoine Poche, MD  colchicine (COLCRYS) 0.6 MG tablet TAKE  ONE TABLET BY MOUTH 2 TIMES A DAY FOR GOUT. 12/24/13  Yes Merlyn Albert, MD  Cyanocobalamin (VITAMIN B-12) 2000 MCG TBCR Take 2,000 mcg by mouth daily.    Yes Historical Provider, MD  DIGOX 125 MCG tablet Take 1 tablet by mouth  every other day 03/19/14  Yes Antoine Poche, MD  doxazosin (CARDURA) 4 MG tablet Take 4 mg by mouth at bedtime. 04/27/13  Yes Antoine Poche, MD  furosemide (LASIX) 40 MG tablet Take 40 mg by mouth every morning. 04/27/13  Yes Antoine Poche, MD  furosemide (LASIX) 40 MG tablet Take 1 tablet by mouth  every morning 03/19/14  Yes Antoine Poche, MD  latanoprost (XALATAN) 0.005 % ophthalmic solution Place 1 drop into both eyes at bedtime.  08/12/12  Yes Historical Provider, MD  metoprolol (LOPRESSOR) 100 MG tablet Take 100 mg by mouth 2 (two) times daily. 04/27/13  Yes Antoine Poche, MD  simvastatin (ZOCOR) 20 MG tablet Take 1 tablet (20 mg total) by mouth daily at 6 PM. 03/26/14  Yes Antoine Poche, MD  warfarin (COUMADIN) 2.5 MG tablet Take 2.5 mg by mouth See admin instructions. Take 2.5 mg daily EXCEPT on Wednesday. No medication on Wednesday.   Yes Historical Provider, MD  zolpidem (AMBIEN) 5 MG tablet TAKE 1  TABLET AT BEDTIME AS NEEDED FOR SLEEP. 02/04/14  Yes Merlyn Albert, MD  metoprolol (LOPRESSOR) 100 MG tablet Take 1 tablet by mouth two  times daily Patient not taking: Reported on 04/09/2014 01/09/14   Antoine Poche, MD  metoprolol (LOPRESSOR) 100 MG tablet Take 1 tablet by mouth two  times daily Patient not taking: Reported on 04/09/2014 03/19/14   Antoine Poche, MD  warfarin (COUMADIN) 5 MG tablet Take 1 tablet by mouth as   directed Patient not taking: Reported on 04/09/2014 01/09/14   Antoine Poche, MD   BP 113/96 mmHg  Pulse 76  Temp(Src) 97.7 F (36.5 C)  Resp 19  Ht 5\' 10"  (1.778 m)  Wt 180 lb (81.647 kg)  BMI 25.83 kg/m2  SpO2 97% Physical Exam  Constitutional: He appears well-developed and well-nourished. No distress.  HENT:  Head: Normocephalic and atraumatic.  Mouth/Throat: Oropharynx is clear and moist. No oropharyngeal exudate.  Eyes: Conjunctivae and EOM are normal. Pupils are equal, round, and reactive to light. Right eye exhibits no discharge. Left eye exhibits no discharge. No scleral icterus.  Neck: Normal range of motion. Neck supple. No JVD present. No thyromegaly present.  Cardiovascular: Normal rate, regular rhythm, normal heart sounds and intact distal pulses.  Exam reveals no gallop and no friction rub.   No murmur heard. Irregularly irregular rhyhm, A fib Mild JVD  Pulmonary/Chest: Effort normal. No respiratory distress. He has wheezes. He has rales.  Rales at bases  Mild tachypnea Expiratory wheezing  Abdominal: Soft. Bowel sounds are normal. He exhibits no distension and no mass. There is no tenderness.  Musculoskeletal: Normal range of motion. He exhibits edema. He exhibits no tenderness.  Left leg swollen - pitting edema below the knees. Slight asymmetry   Lymphadenopathy:    He has no cervical adenopathy.  Neurological: He is alert. Coordination normal.  Skin: Skin is warm and dry. No rash noted. No erythema.  Psychiatric: He has a  normal mood and affect. His behavior is normal.  Nursing note and vitals reviewed.   ED Course  Procedures (including critical care time)  DIAGNOSTIC STUDIES: Oxygen Saturation is 97% on room  air, normal by my interpretation.    COORDINATION OF CARE: 1:33 PM Discussed treatment plan with patient at beside, the patient agrees with the plan and has no further questions at this time.   Labs Review Labs Reviewed  PRO B NATRIURETIC PEPTIDE - Abnormal; Notable for the following:    Pro B Natriuretic peptide (BNP) 9656.0 (*)    All other components within normal limits  CBC WITH DIFFERENTIAL - Abnormal; Notable for the following:    RBC 3.70 (*)    Hemoglobin 11.6 (*)    HCT 36.1 (*)    RDW 16.2 (*)    Platelets 138 (*)    Monocytes Relative 13 (*)    All other components within normal limits  COMPREHENSIVE METABOLIC PANEL - Abnormal; Notable for the following:    Potassium 5.5 (*)    BUN 45 (*)    Creatinine, Ser 3.28 (*)    GFR calc non Af Amer 16 (*)    GFR calc Af Amer 18 (*)    All other components within normal limits  PROTIME-INR - Abnormal; Notable for the following:    Prothrombin Time 29.9 (*)    INR 2.82 (*)    All other components within normal limits  TROPONIN I    Imaging Review Dg Chest 2 View  04/09/2014   CLINICAL DATA:  Increasing shortness of breath over 6 months  EXAM: CHEST  2 VIEW  COMPARISON:  06/06/2013  FINDINGS: A small right-sided pleural effusion and right basilar infiltrate is noted. The left lung is clear. Degenerative changes of the mid thoracic spine are noted. The cardiac shadow is mildly enlarged but stable. Postoperative changes are again seen.  IMPRESSION: Small right-sided pleural effusion and basilar infiltrate.   Electronically Signed   By: Alcide Clever M.D.   On: 04/09/2014 10:08   Ir Nephrostomy Tube Change  04/08/2014   CLINICAL DATA:  Chronic right-sided nephrostomy tube and right ureteral obstruction.  EXAM: RIGHT PERCUTANEOUS  NEPHROSTOMY TUBE EXCHANGE  CONTRAST:  15 ml Omnipaque-300  FLUOROSCOPY TIME:  18 seconds.  PROCEDURE: The procedure, risks, benefits, and alternatives were explained to the patient. Questions regarding the procedure were encouraged and answered. The patient understands and consents to the procedure.  The right flank region and pre-existing nephrostomy tube was prepped with Betadine in a sterile fashion, and a sterile drape was applied covering the operative field. sterile gown and sterile gloves were used for the procedure.  The preexisting catheter was injected with contrast material under fluoroscopy. It was then removed over a guidewire. A new 10 French catheter was advanced over the wire. Final catheter position was confirmed with a fluoroscopic spot image obtained after injection of contrast. The new tube was connected to a gravity bag.  COMPLICATIONS: None.  FINDINGS: The nephrostomy tube was replaced and formed in the lower pole collecting system.  IMPRESSION: Exchange and replacement of chronic indwelling right 10 French nephrostomy tube.   Electronically Signed   By: Irish Lack M.D.   On: 04/08/2014 09:28     EKG Interpretation   Date/Time:  Tuesday April 09 2014 09:34:12 EST Ventricular Rate:  68 PR Interval:    QRS Duration: 104 QT Interval:  396 QTC Calculation: 421 R Axis:   29 Text Interpretation:  Atrial fibrillation Septal infarct , age  undetermined ST \\T \ T wave abnormality, consider inferolateral ischemia  Abnormal ECG since last tracing no significant change Confirmed by Alixis Harmon   MD, Jamey Demchak ([id number]) on 04/09/2014 9:40:17 AM  MDM   Final diagnoses:  Acute on chronic systolic congestive heart failure  Insomnia  CKD (chronic kidney disease), unspecified stage    The patient has had ongoing cardiac monitoring without new significant arrhythmia, he has chronic A. fib, that is what his EKG shows. He has a known ischemic cardiomyopathy, his echocardiogram performed  in 2009 showed a good ejection fraction, he has not had a repeat ultrasound since that time but does follow with cardiology and does take Lasix. I have offered him admission to the hospital because of his increasing peripheral edema and increase shortness of breath but he declines and states he would rather try to increase his Lasix at home. I've informed him that his blood work shows that he likely has worsening congestive heart failure and should follow-up with a cardiologist very closely, he sees Dr. Wyline Moodbranch, he will make this follow-up appointment.  He is aware of the indications for return and agreeable. I will have him increase his Lasix, increase his Ambien tonight to see if that improves.      I personally performed the services described in this documentation, which was scribed in my presence. The recorded information has been reviewed and is accurate.  Vida RollerBrian D Becket Wecker, MD 04/09/14 [phone]

## 2014-04-10 ENCOUNTER — Telehealth: Payer: Self-pay | Admitting: *Deleted

## 2014-04-10 ENCOUNTER — Ambulatory Visit (INDEPENDENT_AMBULATORY_CARE_PROVIDER_SITE_OTHER): Payer: Medicare Other | Admitting: *Deleted

## 2014-04-10 DIAGNOSIS — I482 Chronic atrial fibrillation, unspecified: Secondary | ICD-10-CM

## 2014-04-10 DIAGNOSIS — I4891 Unspecified atrial fibrillation: Secondary | ICD-10-CM

## 2014-04-10 DIAGNOSIS — Z5181 Encounter for therapeutic drug level monitoring: Secondary | ICD-10-CM

## 2014-04-10 DIAGNOSIS — Z7901 Long term (current) use of anticoagulants: Secondary | ICD-10-CM

## 2014-04-10 LAB — POCT INR: INR: 3

## 2014-04-10 NOTE — Telephone Encounter (Signed)
Spoke with patient regarding needing 1-2 day follow up after recent ED visit.  Patient stated that he already has a f/u scheduled with Dr. Gerda Diss tomorrow at 10:00 & does not feel necessary to see both of [place].  Advised him to call back for appointment if PMD felt he needed to be seen by cardiology sooner than his routine time (12/2014).  Patient verbalized understanding.

## 2014-04-11 ENCOUNTER — Encounter: Payer: Self-pay | Admitting: Family Medicine

## 2014-04-11 ENCOUNTER — Ambulatory Visit (INDEPENDENT_AMBULATORY_CARE_PROVIDER_SITE_OTHER): Payer: Medicare Other | Admitting: Family Medicine

## 2014-04-11 VITALS — BP 118/74 | Ht 70.0 in | Wt 192.0 lb

## 2014-04-11 DIAGNOSIS — G4700 Insomnia, unspecified: Secondary | ICD-10-CM

## 2014-04-11 DIAGNOSIS — I509 Heart failure, unspecified: Secondary | ICD-10-CM

## 2014-04-11 DIAGNOSIS — Z79899 Other long term (current) drug therapy: Secondary | ICD-10-CM

## 2014-04-11 MED ORDER — FUROSEMIDE 40 MG PO TABS: ORAL_TABLET | ORAL | Status: DC

## 2014-04-11 MED ORDER — ZOLPIDEM TARTRATE 10 MG PO TABS: ORAL_TABLET | ORAL | Status: DC

## 2014-04-11 NOTE — Patient Instructions (Signed)
Take two lasix each forn for a total of five days, and the decrease back to one and a half tablet each morn  Please see your cardiologist or their assistant/partner soon.

## 2014-04-11 NOTE — Progress Notes (Signed)
   Subjective:    Patient ID: [name], male    DOB: [birth year], 80 y.o.   MRN: [id number]  HPIFollow up from ER. Went on 12/15 for shortness of breath.  Recheck ankle swelling. Emergency room report is reviewed. Patient had substantial elevation of BNP. He was advised to increase his Lasix in the short-term. And follow-up with his cardiologist.  Pt states Dr. Wyline Mood is out of the office for 2 weeks and he does not have an appt scheduled yet.   Trouble sleeping. Taking ambien. Was taking 5mg  and only sleeping 2 -3 hours a night. Was told in the er to take 2 tablets and pt states he is sleeping much better. Slept 8 hours last night.    No actual chest pain, no headache.  Sob is worse at night time.     Review of Systems No current chest pain no palpitations no back pain no headache no nausea ROS otherwise negative    Objective:   Physical Exam Alert no acute distress. HEENT normal. Lungs basilar crackles no tachypnea heart irregular rhythm but controlled abdomen benign ankles without edema       Assessment & Plan:  Impression #1 exacerbation congestive heart failure #2 progressive renal failure. Creatinine already substantially elevated. Increasing Lasix may well exacerbate this also #3 atrial fibrillation. No recent echocardiogram #4 insomnia discussed plan Lasix adjusted. Appropriate blood work next week. Encouraged strongly to follow-up with cardiologist. Ambien 5-10 mg daily at bedtime when necessary. WSL

## 2014-04-14 DIAGNOSIS — G4700 Insomnia, unspecified: Secondary | ICD-10-CM | POA: Insufficient documentation

## 2014-04-14 DIAGNOSIS — I509 Heart failure, unspecified: Secondary | ICD-10-CM | POA: Insufficient documentation

## 2014-04-14 NOTE — Progress Notes (Signed)
ERROR NO SHOW

## 2014-04-15 ENCOUNTER — Encounter: Payer: Medicare Other | Admitting: Adult Health

## 2014-04-23 ENCOUNTER — Encounter: Payer: Self-pay | Admitting: *Deleted

## 2014-05-01 ENCOUNTER — Ambulatory Visit (INDEPENDENT_AMBULATORY_CARE_PROVIDER_SITE_OTHER): Payer: Medicare Other | Admitting: *Deleted

## 2014-05-01 DIAGNOSIS — Z7901 Long term (current) use of anticoagulants: Secondary | ICD-10-CM

## 2014-05-01 DIAGNOSIS — Z5181 Encounter for therapeutic drug level monitoring: Secondary | ICD-10-CM

## 2014-05-01 DIAGNOSIS — I482 Chronic atrial fibrillation, unspecified: Secondary | ICD-10-CM

## 2014-05-01 DIAGNOSIS — I4891 Unspecified atrial fibrillation: Secondary | ICD-10-CM

## 2014-05-01 LAB — POCT INR: INR: 3.2

## 2014-05-13 ENCOUNTER — Ambulatory Visit (INDEPENDENT_AMBULATORY_CARE_PROVIDER_SITE_OTHER): Payer: Medicare Other | Admitting: Family Medicine

## 2014-05-13 ENCOUNTER — Encounter: Payer: Self-pay | Admitting: Family Medicine

## 2014-05-13 VITALS — BP 110/70 | Temp 97.6°F | Ht 70.0 in | Wt 195.5 lb

## 2014-05-13 DIAGNOSIS — H9319 Tinnitus, unspecified ear: Secondary | ICD-10-CM

## 2014-05-13 DIAGNOSIS — I5022 Chronic systolic (congestive) heart failure: Secondary | ICD-10-CM

## 2014-05-13 DIAGNOSIS — N183 Chronic kidney disease, stage 3 unspecified: Secondary | ICD-10-CM

## 2014-05-13 DIAGNOSIS — Z7901 Long term (current) use of anticoagulants: Secondary | ICD-10-CM

## 2014-05-13 DIAGNOSIS — H9193 Unspecified hearing loss, bilateral: Secondary | ICD-10-CM

## 2014-05-13 NOTE — Progress Notes (Signed)
   Subjective:    Patient ID: [name], male    DOB: April 17, 1926, 81 y.o.   MRN: [id number]  Shortness of Breath This is a new problem. The current episode started more than 1 month ago. The problem occurs constantly. The problem has been gradually worsening. Nothing aggravates the symptoms. The patient has no known risk factors for DVT/PE. The treatment provided no relief.  Patient states today is a follow up visit on his shortness of breath.  Patient states that he has no other concerns at this time.     Testing. The patient did not follow-up with the cardiologist as recommended. Patient reports steadiness at times feeling dizzy. Long-standing history of this. Wonders if in urinary or so and throat doctor could help in this regard. Also notes some ringing in the ears and diminished hearing. Would like to see an ear specialist.  Patient reports no orthopnea. No substernal chest pain. Patient does note some swelling in his ankles.  Patient has backed off to just 1 tablet a day on his diuretic.  Review of Systems  Respiratory: Positive for shortness of breath.    no abdominal pain no chest pain no headache no change in bowel habits no blood in stool     Objective:   Physical Exam  Alert mild malaise. Vitals stable blood pressure good on repeat HEENT normal. Lungs no crackles no tachypnea no wheezes heart irregular rhythm noted fair control ankles 1+ edema      Assessment & Plan:  Impression 1 hypertension fair control #2 CHF somewhat complicated by atrial fibrillation, moderate increase in fluid at ankles and weight #3 atrial fibrillation #4 ENT concerns see present illness plan ENT referral per patient request. Strongly encouraged to get back with cardiologist. Increase diuretic to twice a day. Met 7 decent when checked a few weeks ago. Follow-up in 3 months. Warning signs discussed. WSL

## 2014-05-13 NOTE — Patient Instructions (Addendum)
Please increase your fluid tablet to one tab twice per day  You need to see your heart doctor as soon you can, as I recommended last month.  We will work on a referral to the ent doctor

## 2014-05-21 ENCOUNTER — Other Ambulatory Visit: Payer: Self-pay | Admitting: Family Medicine

## 2014-05-28 ENCOUNTER — Other Ambulatory Visit (HOSPITAL_COMMUNITY): Payer: Self-pay | Admitting: Urology

## 2014-05-28 ENCOUNTER — Ambulatory Visit (HOSPITAL_COMMUNITY)
Admission: RE | Admit: 2014-05-28 | Discharge: 2014-05-28 | Disposition: A | Discharge status: Home / Self Care | Payer: Medicare Other | Source: Ambulatory Visit | Attending: Urology | Admitting: Urology

## 2014-05-28 ENCOUNTER — Encounter: Payer: Medicare Other | Admitting: Adult Health

## 2014-05-28 DIAGNOSIS — N135 Crossing vessel and stricture of ureter without hydronephrosis: Secondary | ICD-10-CM

## 2014-05-28 DIAGNOSIS — Z436 Encounter for attention to other artificial openings of urinary tract: Secondary | ICD-10-CM | POA: Diagnosis not present

## 2014-05-28 MED ORDER — LIDOCAINE HCL 1 % IJ SOLN
INTRAMUSCULAR | Status: DC
Start: 2014-05-28 — End: 2014-05-29
  Filled 2014-05-28: qty 20

## 2014-05-28 MED ORDER — IOHEXOL 300 MG/ML  SOLN
10.0000 mL | Freq: Once | INTRAMUSCULAR | Status: AC | PRN
  Administered 2014-05-28: 25 mL

## 2014-05-28 NOTE — Procedures (Signed)
Successful right sided PCN exchange.  No immediate complications.

## 2014-05-29 ENCOUNTER — Ambulatory Visit (INDEPENDENT_AMBULATORY_CARE_PROVIDER_SITE_OTHER): Payer: Medicare Other | Admitting: *Deleted

## 2014-05-29 DIAGNOSIS — Z5181 Encounter for therapeutic drug level monitoring: Secondary | ICD-10-CM

## 2014-05-29 DIAGNOSIS — I482 Chronic atrial fibrillation, unspecified: Secondary | ICD-10-CM

## 2014-05-29 DIAGNOSIS — I4891 Unspecified atrial fibrillation: Secondary | ICD-10-CM

## 2014-05-29 DIAGNOSIS — Z7901 Long term (current) use of anticoagulants: Secondary | ICD-10-CM

## 2014-05-29 LAB — POCT INR: INR: 2.9

## 2014-05-30 ENCOUNTER — Encounter: Payer: Medicare Other | Admitting: Adult Health

## 2014-06-03 ENCOUNTER — Other Ambulatory Visit (HOSPITAL_COMMUNITY): Payer: Medicare Other

## 2014-06-04 ENCOUNTER — Ambulatory Visit (INDEPENDENT_AMBULATORY_CARE_PROVIDER_SITE_OTHER): Payer: Medicare Other | Admitting: Cardiology

## 2014-06-04 ENCOUNTER — Encounter: Payer: Self-pay | Admitting: Cardiology

## 2014-06-04 VITALS — BP 110/66 | HR 77 | Ht 70.0 in | Wt 190.0 lb

## 2014-06-04 DIAGNOSIS — I10 Essential (primary) hypertension: Secondary | ICD-10-CM

## 2014-06-04 DIAGNOSIS — I482 Chronic atrial fibrillation, unspecified: Secondary | ICD-10-CM

## 2014-06-04 DIAGNOSIS — I429 Cardiomyopathy, unspecified: Secondary | ICD-10-CM

## 2014-06-04 DIAGNOSIS — Z7901 Long term (current) use of anticoagulants: Secondary | ICD-10-CM

## 2014-06-04 DIAGNOSIS — E785 Hyperlipidemia, unspecified: Secondary | ICD-10-CM

## 2014-06-04 NOTE — Patient Instructions (Signed)
Your physician recommends that you schedule a follow-up appointment in: 3 months with Dr Wyline Mood  Please STOP Digoxin     Thank you for choosing [facility] !

## 2014-06-04 NOTE — Progress Notes (Signed)
Clinical Summary [name] is a 81 y.o.male seen today for follow up of the following medical problems.   1. Afib  - denies any palpitations  - followed in coumadin clinic here, compliant. Denies any bleeding issues   2. History of cardiomyopathy  - prior LVEF 30-40% in 06/1998, LVEF normalized in 2009 LVEF 50-55%  - seen in ER 03/2014 with increasing LE edema. Cr 3.28(baseline variable 2.1-3.4), BUN 45, GFR 16, CXR small right effusion.  - f/u with pcp increased diuretic to bid . Patient has since decreased his dose back to  bid. Swelling has improved, denies current SOB.    3. Kidney stones/Hydronephrosis/CKD  - recurrent issues with kidney stones and hydronephrosis, followed by urology. Currently with nephrostomy tube.   4. Right hypogastric artery aneurysm, AAA, right common iliac artery aneurysm  - all followed by vascular Dr Edilia Bo, AAA has been stable  5. Hyperlipidemia - compliant with statin - 10/2013 TC 118 TG 89 HDL 39 LDL 61  6. HTN - does not check at home  - compliant with meds Past Medical History  Diagnosis Date  . Hyperlipidemia   . White coat hypertension   . Cardiomyopathy      EF:30-40% in 06/1998, but 50-55% in 2009;  HX ISCHEMIC CARDIOMYOPATHY  . Chronic atrial fibrillation   . Vitamin B12 deficiency   . Chronic anticoagulation   . Arthritis   . Sensory neuropathy   . Chronic kidney disease, stage III (moderate)     creatinine 1.5 2001, 2.0 2009, 2.28 in 12/2009  . Mild renal insufficiency   . Hydronephrosis, bilateral   . Ureteral calculi     BILATERAL  . History of CHF (congestive heart failure)     1999  &  2005  . History of kidney stones   . Bilateral renal cysts   . 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 Chronic venous insufficiency   . PAD (peripheral artery disease)   . Edema of both legs   . History of  gout     per pt stable  . Glaucoma of both eyes   . Wears hearing aid     bilateral     Allergies  Allergen Reactions  . Sulfa Antibiotics Rash    Nausea      Current Outpatient Prescriptions  Medication Sig Dispense Refill  . acetaminophen (TYLENOL) 500 MG tablet Take 1,000 mg by mouth every 6 (six) hours as needed for mild pain, moderate pain or headache.     Marland Kitchen amLODipine (NORVASC) 5 MG tablet Take 1 tablet by mouth  every morning 90 tablet 3  . colchicine (COLCRYS) 0.6 MG tablet TAKE ONE TABLET BY MOUTH 2 TIMES A DAY FOR GOUT. 24 tablet 11  . Cyanocobalamin (VITAMIN B-12) 2000 MCG TBCR Take 2,000 mcg by mouth daily.     Marland Kitchen DIGOX 125 MCG tablet Take 1 tablet by mouth  every other day 45 tablet 3  . doxazosin (CARDURA) 4 MG tablet Take 4 mg by mouth at bedtime.    . furosemide (LASIX) 40 MG tablet Take 1 and 1/2 tablets by  mouth every morning 135 tablet 0  . latanoprost (XALATAN) 0.005 % ophthalmic solution Place 1 drop into both eyes at bedtime.     . metoprolol (LOPRESSOR) 100 MG tablet Take 1 tablet by mouth  two  times daily 120 tablet 10  . simvastatin (ZOCOR) 20 MG tablet Take 1 tablet (20 mg total) by mouth daily at 6 PM. 90 tablet 3  . warfarin (COUMADIN) 2.5 MG tablet Take 2.5 mg by mouth See admin instructions. Take 2.5 mg daily EXCEPT on Wednesday. No medication on Wednesday.    . zolpidem (AMBIEN) 10 MG tablet TAKE 1 TABLET AT BEDTIME AS NEEDED FOR SLEEP. 90 tablet 1   No current facility-administered medications for this visit.     Past Surgical History  Procedure Laterality Date  . Repair thoracic aorta  01/1995    s/p rupture  . Cataract extraction w/ intraocular lens  implant, bilateral  2012  . 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 Laparoscopic cholecystectomy  JAN 2005  . Percutaneous nephrostolithotomy  1970's  . Abdominal aortagram  07-16-2003  DR ROTHBART    W/ COIL EMBOLIZATION OF SIX BRANCHES OF  RIGHT INTERNAL RENAL ARTERY ANEURYSM  . Cardiovascular stress test  04-25-2003    LOW RISK CARDIOLITE STUDY/ MODERATE LV DILATATION AND IMPAIRMENT OF LVSF/  NO ISCHEMIA  . Transthoracic echocardiogram  02-07-2008  DR ROTHBART    MODERATE DILATED LV/  EF 50-55%/ MILD AR/ MILD TO MODERATE AORTIC ROOT DILATATION/ MODERATE MR/ MODERATE LA   &  RA   DIILATED/ MILD TO MODERATE TR  . Cystoscopy w/ ureteral stent placement Bilateral 10/24/2012    Procedure: CYSTOSCOPY WITH RETROGRADE PYELOGRAM/URETERAL STENT PLACEMENT;  Surgeon: Lindaann Slough, MD;  Location: [facility];  Service: Urology;  Laterality: Bilateral;  . Cystoscopy with ureteroscopy Left 11/27/2012    Procedure: CYSTOSCOPY,LEFT URETEROSCOPY WITH LASER LITHOTRIPSY/REMOVAL OF MIGRATED STENT, PLACEMENT OF LEFT STENT;  Surgeon: Garnett Farm, MD;  Location: [facility];  Service: Urology;  Laterality: Left;  . Holmium laser application Left 11/27/2012    Procedure: HOLMIUM LASER APPLICATION;  Surgeon: Garnett Farm, MD;  Location: [facility];  Service: Urology;  Laterality: Left;  . Cystoscopy with ureteroscopy and stent placement N/A 12/29/2012    Procedure: CYSTOSCOPY WITH URETEROSCOPY AND STENT PLACEMENT, removal of right and left stents, retrogrades, right stent exchange;  Surgeon: Garnett Farm, MD;  Location: [facility];  Service: Urology;  Laterality: N/A;  . Holmium laser application Right 12/29/2012    Procedure: HOLMIUM LASER APPLICATION;  Surgeon: Garnett Farm, MD;  Location: [facility];  Service: Urology;  Laterality: Right;  HLL OF (RT) URETERAL PELVIC JUNCTION STONE  . Cystoscopy with retrograde pyelogram, ureteroscopy and stent placement Right 01/29/2013    Procedure: CYSTOSCOPY WITH RIGHT URETEROSCOPY AND STENT PLACEMENT;  Surgeon: Garnett Farm, MD;  Location: [facility];  Service: Urology;  Laterality: Right;  . Holmium laser application N/A 01/29/2013    Procedure: HOLMIUM LASER APPLICATION;  Surgeon: Garnett Farm, MD;  Location: [facility];   Service: Urology;  Laterality: N/A;  . Esophagogastroduodenoscopy N/A 02/16/2013    Procedure: ESOPHAGOGASTRODUODENOSCOPY (EGD);  Surgeon: Meryl Dare, MD;  Location: [facility];  Service: Endoscopy;  Laterality: N/A;  . Cystoscopy with retrograde pyelogram, ureteroscopy and stent placement Right 05/07/2013    Procedure: CYSTOSCOPY WITH RIGHT RETROGRADE PYELOGRAM, Balloon dilation of right ureter, Laser incision of right ureter, Nephrostogram;  Surgeon: Garnett Farm, MD;  Location: [facility];  Service: Urology;  Laterality: Right;  . Holmium laser application Right 05/07/2013    Procedure: HOLMIUM LASER APPLICATION;  Surgeon: Garnett Farm, MD;  Location: [facility];  Service: Urology;  Laterality: Right;     Allergies  Allergen Reactions  . Sulfa Antibiotics Rash    Nausea       Family History  Problem Relation Age of Onset  . Heart disease Mother   . Early death Father   . Aneurysm Brother   . Aortic aneurysm Brother   . Diabetes Sister      Social History Mr. Boissonneault reports that he has never smoked. He has never used smokeless tobacco. Mr. Broyhill reports that he does not drink alcohol.   Review of Systems CONSTITUTIONAL: No weight loss, fever, chills, weakness or fatigue.  HEENT: sinus congestion  SKIN: No rash or itching.  CARDIOVASCULAR: per HPI RESPIRATORY: No shortness of breath, cough or sputum.  GASTROINTESTINAL: No anorexia, nausea, vomiting or diarrhea. No abdominal pain or blood.  GENITOURINARY: No burning on urination, no polyuria NEUROLOGICAL: No headache, dizziness, syncope, paralysis, ataxia, numbness or tingling in the extremities. No change in bowel or bladder control.  MUSCULOSKELETAL: No muscle, back pain, joint pain or stiffness.  LYMPHATICS: No enlarged nodes. No history of splenectomy.  PSYCHIATRIC: No history of depression or anxiety.  ENDOCRINOLOGIC: No reports of sweating, cold or heat intolerance. No polyuria or polydipsia.  Marland Kitchen   Physical  Examination p 77 bp 110/66 Wt 190 lbs BMI 27 Gen: resting comfortably, no acute distress HEENT: no scleral icterus, pupils equal round and reactive, no palptable cervical adenopathy,  CV: irreg, no m/r/g, no JVD, no carotid bruits Resp: Clear to auscultation bilaterally GI: abdomen is soft, non-tender, non-distended, normal bowel sounds, no hepatosplenomegaly MSK: extremities are warm, no edema.  Skin: warm, no rash Neuro:  no focal deficits Psych: appropriate affect   Diagnostic Studies 01/2012 AAA [place]  Aneurysm dilatation of the mid to distal abdominal aorta 4.7 cm  20.7x12.7x15.6 cm cystic structure anterior to the abdominal aorta    Assessment and Plan  1. Afib  - no current symptoms  - continue rate control and coumadin   2. History of Cardiomyopathy  - increased swelling over the last few months, improved with increased lasix. Patient will continue current dose of  bid, counseled ok to take extra as needed if swelling of SOB worsens - discussed repeat echo, patient is not in favor at this time.   3. Arterial aneurysms  - continue regular follow up with vascular  4. HTN - at goal, continue current meds  5. Hyperlipidemia - at goal, continue current statin    F/u 3 months  Antoine Poche, M.D.

## 2014-06-19 ENCOUNTER — Other Ambulatory Visit (HOSPITAL_COMMUNITY): Payer: Medicare Other

## 2014-06-19 ENCOUNTER — Ambulatory Visit: Payer: Medicare Other | Admitting: Vascular Surgery

## 2014-06-25 ENCOUNTER — Encounter: Payer: Self-pay | Admitting: Vascular Surgery

## 2014-06-26 ENCOUNTER — Other Ambulatory Visit (HOSPITAL_COMMUNITY): Payer: Medicare Other

## 2014-06-26 ENCOUNTER — Encounter: Payer: Self-pay | Admitting: Vascular Surgery

## 2014-06-26 ENCOUNTER — Ambulatory Visit: Payer: Medicare Other | Admitting: Vascular Surgery

## 2014-06-26 ENCOUNTER — Ambulatory Visit (HOSPITAL_COMMUNITY)
Admission: RE | Admit: 2014-06-26 | Discharge: 2014-06-26 | Disposition: A | Discharge status: Home / Self Care | Payer: Medicare Other | Source: Ambulatory Visit | Attending: Vascular Surgery | Admitting: Vascular Surgery

## 2014-06-26 ENCOUNTER — Ambulatory Visit (INDEPENDENT_AMBULATORY_CARE_PROVIDER_SITE_OTHER): Payer: Medicare Other | Admitting: Vascular Surgery

## 2014-06-26 VITALS — BP 108/74 | HR 72 | Ht 70.0 in | Wt 193.5 lb

## 2014-06-26 DIAGNOSIS — Z48812 Encounter for surgical aftercare following surgery on the circulatory system: Secondary | ICD-10-CM

## 2014-06-26 DIAGNOSIS — I714 Abdominal aortic aneurysm, without rupture, unspecified: Secondary | ICD-10-CM

## 2014-06-26 NOTE — Addendum Note (Signed)
Addended by: Adria Dill L on: 06/26/2014 03:24 PM   Modules accepted: Orders

## 2014-06-26 NOTE — Progress Notes (Signed)
[facility]  Patient name: [name] MRN: [id number] DOB: Nov 06, 1925 Sex: male  REASON FOR VISIT: Follow up of abdominal aortic aneurysm.  HPI: [name] is a 81 y.o. male who I last saw on 12/12/2013 to follow his abdominal aortic aneurysm. At that time the maximum diameter was 5.2 cm. The right common iliac artery measured 3.1 cm in maximum diameter and the left common iliac artery measured 1.35 cm in maximum diameter. I recommended a follow up study in 6 months.  [name] comes in for his 6 month follow up visit. He continues to have chronic low back pain. He denies any abdominal pain. He states that he is weak all over with less energy than normal. He is 81 and I think this is certainly understandable.  Past Medical History  Diagnosis Date  . Hyperlipidemia   . White coat hypertension   . Cardiomyopathy      EF:30-40% in 06/1998, but 50-55% in 2009;  HX ISCHEMIC CARDIOMYOPATHY  . Chronic atrial fibrillation   . Vitamin B12 deficiency   . Chronic anticoagulation   . Arthritis   . Sensory neuropathy   . Chronic kidney disease, stage III (moderate)     creatinine 1.5 2001, 2.0 2009, 2.28 in 12/2009  . Mild renal insufficiency   . Hydronephrosis, bilateral   . Ureteral calculi     BILATERAL  . History of CHF (congestive heart failure)     1999  &  2005  . History of kidney stones   . Bilateral renal cysts   . Aneurysm of infrarenal abdominal aorta 3.9 x 3.6 no change 8/05 no change in 2010;  4.7 in 2013    MONITORED BY Kieu Quiggle-- LAST VISIT OCT 2013--  4.7CM  . Chronic venous insufficiency   . PAD (peripheral artery disease)   . Edema of both legs   . History of gout     per pt stable  . Glaucoma of both eyes   . Wears hearing aid     bilateral   Family History  Problem Relation Age of Onset  . Heart disease Mother   . Early death Father   . Aneurysm Brother   . Aortic aneurysm Brother   . Diabetes Sister    SOCIAL HISTORY: History    Substance Use Topics  . Smoking status: Never Smoker   . Smokeless tobacco: Never Used  . Alcohol Use: No   Allergies  Allergen Reactions  . Sulfa Antibiotics Rash    Nausea    Current Outpatient Prescriptions  Medication Sig Dispense Refill  . acetaminophen (TYLENOL) 500 MG tablet Take 1,000 mg by mouth every 6 (six) hours as needed for mild pain, moderate pain or headache.     Marland Kitchen amLODipine (NORVASC) 5 MG tablet Take 1 tablet by mouth  every morning 90 tablet 3  . colchicine (COLCRYS) 0.6 MG tablet TAKE ONE TABLET BY MOUTH 2 TIMES A DAY FOR GOUT. 24 tablet 11  . Cyanocobalamin (VITAMIN B-12) 2000 MCG TBCR Take 2,000 mcg by mouth daily.     Marland Kitchen doxazosin (CARDURA) 4 MG tablet Take 4 mg by mouth at bedtime.    . furosemide (LASIX) 40 MG tablet Take 1 and 1/2 tablets by  mouth every morning 135 tablet 0  . latanoprost (XALATAN) 0.005 % ophthalmic solution Place 1 drop into both eyes at bedtime.     . metoprolol (LOPRESSOR) 100 MG tablet Take 1 tablet by mouth two  times  daily 120 tablet 10  . simvastatin (ZOCOR) 20 MG tablet Take 1 tablet (20 mg total) by mouth daily at 6 PM. 90 tablet 3  . warfarin (COUMADIN) 2.5 MG tablet Take 2.5 mg by mouth See admin instructions. Take 2.5 mg daily EXCEPT on Wednesday. No medication on Wednesday.    . zolpidem (AMBIEN) 10 MG tablet TAKE 1 TABLET AT BEDTIME AS NEEDED FOR SLEEP. 90 tablet 1   No current facility-administered medications for this visit.   REVIEW OF SYSTEMS: Arly.Keller ] denotes positive finding; [  ] denotes negative finding  CARDIOVASCULAR:   chest pain    chest pressure    palpitations   Arly.Keller ] orthopnea   Arly.Keller ] dyspnea on exertion    claudication    rest pain    DVT    phlebitis PULMONARY:    productive cough   Arly.Keller ] asthma   Arly.Keller ] wheezing NEUROLOGIC:   Arly.Keller ] weakness   paresthesias   aphasia   amaurosis   dizziness HEMATOLOGIC:    bleeding problems    clotting disorders MUSCULOSKELETAL:   joint pain     joint swelling  leg swelling GASTROINTESTINAL:   blood in stool    hematemesis GENITOURINARY:    dysuria    hematuria PSYCHIATRIC:   history of major depression INTEGUMENTARY:   rashes   ulcers CONSTITUTIONAL:   fever    chills  PHYSICAL EXAM: Filed Vitals:   06/26/14 1101  BP: 108/74  Pulse: 72  Height:  (1.778 m)  Weight: 193 lb 8 oz (87.771 kg)  SpO2: 94%   Body mass index is 27.76 kg/(m^2). GENERAL: The patient is a well-nourished male, in no acute distress. The vital signs are documented above. CARDIOVASCULAR: There is an irrregular rhythm. I do not detect carotid bruits. He has a palpable right femoral pulse and a diminished left femoral pulse. He has bilateral lower extremity edema. PULMONARY: There is good air exchange bilaterally without wheezing or rales. ABDOMEN: Soft and non-tender with normal pitched bowel sounds. Because of his size it is difficult to palpate his aneurysm. MUSCULOSKELETAL: There are no major deformities or cyanosis. NEUROLOGIC: No focal weakness or paresthesias are detected. SKIN: There are no ulcers or rashes noted. PSYCHIATRIC: The patient has a normal affect.  DATA:  I have independently interpreted his abdominal aortic duplex. This shows that the maximum diameter of his aneurysm is 5.34 cm and is increased only slightly over the last 6 months. The right common iliac artery measures 3 cm in maximum diameter. The left common iliac artery measures 1.9 cm in maximum diameter.  MEDICAL ISSUES: ABDOMINAL AORTIC ANEURYSM: His aneurysm is increased slightly in size. I've explained certainly given his age we would only recommend repair if this increased significantly in size. We discussed potentially stretching his follow up out to 1 year but agreed to continue with 6 month follow up at this point given the size of the aneurysm. I'll see him back in 6 months. He is to call sooner if he has problems. Fortunately he is  not a smoker.   Return in about 6 months (around 12/27/2014).   Thyra Yinger S [facility] Beeper: [phone]

## 2014-06-27 ENCOUNTER — Ambulatory Visit (INDEPENDENT_AMBULATORY_CARE_PROVIDER_SITE_OTHER): Payer: Medicare Other | Admitting: Otolaryngology

## 2014-07-01 ENCOUNTER — Encounter: Payer: Self-pay | Admitting: Adult Health

## 2014-07-01 ENCOUNTER — Encounter: Payer: Self-pay | Admitting: Family Medicine

## 2014-07-01 ENCOUNTER — Ambulatory Visit (INDEPENDENT_AMBULATORY_CARE_PROVIDER_SITE_OTHER): Payer: Medicare Other | Admitting: *Deleted

## 2014-07-01 ENCOUNTER — Ambulatory Visit (INDEPENDENT_AMBULATORY_CARE_PROVIDER_SITE_OTHER): Payer: Medicare Other | Admitting: Family Medicine

## 2014-07-01 ENCOUNTER — Ambulatory Visit (INDEPENDENT_AMBULATORY_CARE_PROVIDER_SITE_OTHER): Payer: Medicare Other | Admitting: Adult Health

## 2014-07-01 VITALS — BP 104/62 | Temp 97.5°F | Ht 70.0 in | Wt 194.0 lb

## 2014-07-01 VITALS — BP 98/60 | HR 64 | Ht 70.0 in | Wt 193.6 lb

## 2014-07-01 DIAGNOSIS — I10 Essential (primary) hypertension: Secondary | ICD-10-CM | POA: Diagnosis not present

## 2014-07-01 DIAGNOSIS — R5383 Other fatigue: Secondary | ICD-10-CM | POA: Diagnosis not present

## 2014-07-01 DIAGNOSIS — N183 Chronic kidney disease, stage 3 unspecified: Secondary | ICD-10-CM

## 2014-07-01 DIAGNOSIS — E785 Hyperlipidemia, unspecified: Secondary | ICD-10-CM

## 2014-07-01 DIAGNOSIS — D649 Anemia, unspecified: Secondary | ICD-10-CM

## 2014-07-01 DIAGNOSIS — I482 Chronic atrial fibrillation, unspecified: Secondary | ICD-10-CM

## 2014-07-01 DIAGNOSIS — R1314 Dysphagia, pharyngoesophageal phase: Secondary | ICD-10-CM

## 2014-07-01 DIAGNOSIS — I951 Orthostatic hypotension: Secondary | ICD-10-CM

## 2014-07-01 DIAGNOSIS — I509 Heart failure, unspecified: Secondary | ICD-10-CM

## 2014-07-01 DIAGNOSIS — Z5181 Encounter for therapeutic drug level monitoring: Secondary | ICD-10-CM

## 2014-07-01 DIAGNOSIS — Z7901 Long term (current) use of anticoagulants: Secondary | ICD-10-CM

## 2014-07-01 DIAGNOSIS — I4891 Unspecified atrial fibrillation: Secondary | ICD-10-CM

## 2014-07-01 LAB — POCT INR: INR: 4.6

## 2014-07-01 MED ORDER — AMLODIPINE BESYLATE 2.5 MG PO TABS: 2.5000 mg | ORAL_TABLET | Freq: Every day | ORAL | Status: DC

## 2014-07-01 MED ORDER — ZOLPIDEM TARTRATE 10 MG PO TABS: ORAL_TABLET | ORAL | Status: DC

## 2014-07-01 NOTE — Progress Notes (Deleted)
Name: [name]    DOB: 07-29-1925  Age: 81 y.o.  MR#: [id number]       PCP:  [name], MD      Insurance: Payor: MEDICARE / Plan: MEDICARE PART A AND B / Product Type: *No Product type* /   CC:    Chief Complaint  Patient presents with  . Atrial Fibrillation  . Cardiomyopathy  . AAA    VS Filed Vitals:   07/01/14 1508  BP: 98/60  Pulse: 64  Height:  (1.778 m)  Weight: 193 lb 9.6 oz (87.816 kg)  SpO2: 98%    Weights Current Weight  07/01/14 193 lb 9.6 oz (87.816 kg)  07/01/14 194 lb (87.998 kg)  06/26/14 193 lb 8 oz (87.771 kg)    Blood Pressure  BP Readings from Last 3 Encounters:  07/01/14 98/60  07/01/14 104/62  06/26/14 108/74     Admit date:  (Not on file) Last encounter with RMR:  Visit date not found   Allergy Sulfa antibiotics  Current Outpatient Prescriptions  Medication Sig Dispense Refill  . acetaminophen (TYLENOL) 500 MG tablet Take 1,000 mg by mouth every 6 (six) hours as needed for mild pain, moderate pain or headache.     Marland Kitchen amLODipine (NORVASC) 5 MG tablet Take 1 tablet by mouth  every morning 90 tablet 3  . colchicine (COLCRYS) 0.6 MG tablet TAKE ONE TABLET BY MOUTH 2 TIMES A DAY FOR GOUT. 24 tablet 11  . Cyanocobalamin (VITAMIN B-12) 2000 MCG TBCR Take 2,000 mcg by mouth daily.     Marland Kitchen DIGOX 125 MCG tablet     . doxazosin (CARDURA) 4 MG tablet Take 4 mg by mouth at bedtime.    . furosemide (LASIX) 40 MG tablet Take 1 and 1/2 tablets by  mouth every morning 135 tablet 0  . latanoprost (XALATAN) 0.005 % ophthalmic solution Place 1 drop into both eyes at bedtime.     . metoprolol (LOPRESSOR) 100 MG tablet Take 1 tablet by mouth two  times daily 120 tablet 10  . simvastatin (ZOCOR) 20 MG tablet Take 1 tablet (20 mg total) by mouth daily at 6 PM. 90 tablet 3  . warfarin (COUMADIN) 2.5 MG tablet Take 2.5 mg by mouth See admin instructions. Take 2.5 mg daily EXCEPT on Wednesday. No medication on Wednesday.    . zolpidem (AMBIEN) 10 MG tablet TAKE  1 TABLET AT BEDTIME AS NEEDED FOR SLEEP. 90 tablet 1   No current facility-administered medications for this visit.    Discontinued Meds:   There are no discontinued medications.  Patient Active Problem List   Diagnosis Date Noted  . Congestive heart failure 04/14/2014  . Insomnia 04/14/2014  . Encounter for therapeutic drug monitoring 06/04/2013  . Esophageal candidiasis 02/16/2013  . Nonspecific abnormal finding in stool contents 02/13/2013  . Acute renal failure 02/12/2013  . Hypotension 02/12/2013  . Melena 02/12/2013  . Anemia 02/12/2013  . Urinary tract infection, site not specified 02/12/2013  . Sepsis 02/12/2013  . Thoracic aortic aneurysm 02/03/2011  . Aneurysm of peripheral artery 02/03/2011  . Chronic kidney disease, stage III (moderate)   . AAA (abdominal aortic aneurysm)   . White coat hypertension   . Nephrolithiasis   . Chronic anticoagulation 07/23/2010  . VITAMIN B12 DEFICIENCY 02/21/2009  . HYPERLIPIDEMIA 02/18/2009  . Gout 02/18/2009  . Atrial fibrillation, chronic 02/18/2009    LABS    Component Value Date/Time   NA 142 04/09/2014 1038   NA  141 11/19/2013 0948   NA 137 06/06/2013 0749   K 5.5* 04/09/2014 1038   K 5.8* 11/19/2013 0948   K 4.6 06/06/2013 0749   CL 105 04/09/2014 1038   CL 105 11/19/2013 0948   CL 100 06/06/2013 0749   CO2 22 04/09/2014 1038   CO2 26 11/19/2013 0948   CO2 25 06/06/2013 0749   GLUCOSE 94 04/09/2014 1038   GLUCOSE 98 11/19/2013 0948   GLUCOSE 104* 06/06/2013 0749   BUN 45* 04/09/2014 1038   BUN 49* 11/19/2013 0948   BUN 33* 06/06/2013 0749   CREATININE 3.28* 04/09/2014 1038   CREATININE 3.44* 11/19/2013 0948   CREATININE 2.15* 06/06/2013 0749   CREATININE 2.68* 05/03/2013 0905   CREATININE 2.01* 11/16/2012 0710   CREATININE 2.65* 10/05/2012 0725   CALCIUM 8.9 04/09/2014 1038   CALCIUM 9.1 11/19/2013 0948   CALCIUM 8.8 06/06/2013 0749   GFRNONAA 16* 04/09/2014 1038   GFRNONAA 26* 06/06/2013 0749    GFRNONAA 20* 05/03/2013 0905   GFRAA 18* 04/09/2014 1038   GFRAA 31* 06/06/2013 0749   GFRAA 23* 05/03/2013 0905   CMP     Component Value Date/Time   NA 142 04/09/2014 1038   K 5.5* 04/09/2014 1038   CL 105 04/09/2014 1038   CO2 22 04/09/2014 1038   GLUCOSE 94 04/09/2014 1038   BUN 45* 04/09/2014 1038   CREATININE 3.28* 04/09/2014 1038   CREATININE 3.44* 11/19/2013 0948   CALCIUM 8.9 04/09/2014 1038   PROT 7.2 04/09/2014 1038   ALBUMIN 3.8 04/09/2014 1038   AST 14 04/09/2014 1038   ALT 8 04/09/2014 1038   ALKPHOS 69 04/09/2014 1038   BILITOT 0.7 04/09/2014 1038   GFRNONAA 16* 04/09/2014 1038   GFRAA 18* 04/09/2014 1038       Component Value Date/Time   WBC 5.5 04/09/2014 1038   WBC 8.9 06/06/2013 0749   WBC 6.9 05/03/2013 0905   HGB 11.6* 04/09/2014 1038   HGB 12.3* 06/06/2013 0749   HGB 12.0* 05/03/2013 0905   HCT 36.1* 04/09/2014 1038   HCT 37.3* 06/06/2013 0749   HCT 37.3* 05/03/2013 0905   MCV 97.6 04/09/2014 1038   MCV 94.7 06/06/2013 0749   MCV 97.1 05/03/2013 0905    Lipid Panel     Component Value Date/Time   CHOL 118 11/19/2013 0948   TRIG 89 11/19/2013 0948   HDL 39* 11/19/2013 0948   CHOLHDL 3.0 11/19/2013 0948   VLDL 18 11/19/2013 0948   LDLCALC 61 11/19/2013 0948    ABG    Component Value Date/Time   TCO2 21 10/24/2012 0718     Lab Results  Component Value Date   TSH 4.674* 09/26/2012   BNP (last 3 results) No results for input(s): BNP in the last 8760 hours.  ProBNP (last 3 results)  Recent Labs  04/09/14 1038  PROBNP 9656.0*    Cardiac Panel (last 3 results) No results for input(s): CKTOTAL, CKMB, TROPONINI, RELINDX in the last 72 hours.  Iron/TIBC/Ferritin/ %Sat No results found for: IRON, TIBC, FERRITIN, IRONPCTSAT   EKG Orders placed or performed during the hospital encounter of 04/09/14  . ED EKG  . ED EKG  . EKG 12-Lead  . EKG 12-Lead  . EKG     Prior Assessment and Plan Problem List as of 07/01/2014       Cardiovascular and Mediastinum   Atrial fibrillation, chronic   Last Assessment & Plan 02/03/2012 Office Visit Written 02/03/2012  5:01 PM by Gerrit Friends  Rothbart, MD    Heart rate is well controlled.  Patient does not report symptoms attributable to his atrial arrhythmia.  Current strategy of anticoagulation and heart rate control will be pursued.      AAA (abdominal aortic aneurysm)   Last Assessment & Plan 12/12/2013 Office Visit Written 12/12/2013 10:54 AM by Chuck Hint, MD    The patient has a stable 5.2 cm infrarenal abdominal aortic aneurysm. He is almost 81 years old. I've explained that in a normal risk patient we would consider elective repair at 5.5 cm. Given his age, and somewhat debilitated state, I would probably wait even longer in his case. Regardless, the aneurysm has remained stable in size. I have ordered a follow up ultrasound in 6 months and I will see him back at that time. Fortunately he is not a smoker. His blood pressure has been under good control.      White coat hypertension   Last Assessment & Plan 02/03/2012 Office Visit Written 02/03/2012  5:04 PM by Kathlen Brunswick, MD    Blood pressure is well controlled at this visit.  Current medications will be continued.      Thoracic aortic aneurysm   Aneurysm of peripheral artery   Hypotension   Congestive heart failure     Digestive   Melena   Esophageal candidiasis     Genitourinary   Chronic kidney disease, stage III (moderate)   Last Assessment & Plan 02/03/2012 Office Visit Edited 02/07/2012 11:32 AM by Kathlen Brunswick, MD    Renal disease is slowly progressive, with an increase from 2.0 to 2.4 over the past 4 years.  At this rate, there is hope that patient will not require renal replacement therapy.  It is not clear that he would be interested in undergoing dialysis if required.      Nephrolithiasis   Acute renal failure   Urinary tract infection, site not specified     Other   VITAMIN B12  DEFICIENCY   HYPERLIPIDEMIA   Last Assessment & Plan 02/03/2012 Office Visit Written 02/03/2012  5:02 PM by Kathlen Brunswick, MD    Excellent control of hyperlipidemia with current therapy, which will be continued.      Gout   Chronic anticoagulation   Anemia   Sepsis   Nonspecific abnormal finding in stool contents   Encounter for therapeutic drug monitoring   Insomnia       Imaging: No results found.

## 2014-07-01 NOTE — Progress Notes (Signed)
Cardiology Office Note   Date:  07/01/2014   ID:  [name], DOB 10-Jan-1926, MRN [id number]  PCP:  [name], MD  Cardiologist:  Arlington Calix, NP   Chief Complaint  Patient presents with  . Atrial Fibrillation  . Cardiomyopathy  . AAA      History of Present Illness: [name] is a 81 y.o. male who presents for ongoing assessment and management of atrial fibrillation, cardiomyopathy, with most recent echocardiogram, revealing an EF of 50-55%, right hypogastric artery aneurysm, AAA, right common iliac artery aneurysm, followed by Dr. Edilia Bo.  He was last seen by Dr. Wyline Mood on 06/04/2014, and was stable from a cardiac standpoint, he was continued on his current dose of Lasix 20 mg twice a day and counseled to take extra Lasix as needed.  If swelling or shortness of breath worsens, a repeat echocardiogram was discussed with the patient that he was not in favor of proceeding at that time.  He comes today stating that he is more short of breath, that he is weak, he feels like he is retaining fluid again.  He did not want to wait for followup appointment stating that he wanted to be seen sooner.  He was recently seen by his primary care physician, today, and has had lab work ordered.  He denies chest pain, he denies, nausea or vomiting, but he does state that his breathing status has worsened.  He states that his digoxin has been discontinued due to his renal function.    Past Medical History  Diagnosis Date  . Hyperlipidemia   . White coat hypertension   . Cardiomyopathy      EF:30-40% in 06/1998, but 50-55% in 2009;  HX ISCHEMIC CARDIOMYOPATHY  . Chronic atrial fibrillation   . Vitamin B12 deficiency   . Chronic anticoagulation   . Arthritis   . Sensory neuropathy   . Chronic kidney disease, stage III (moderate)     creatinine 1.5 2001, 2.0 2009, 2.28 in 12/2009  . Mild renal insufficiency   . Hydronephrosis, bilateral   . Ureteral calculi     BILATERAL  . History  of CHF (congestive heart failure)     1999  &  2005  . History of kidney stones   . Bilateral renal cysts   . 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 Chronic venous insufficiency   . PAD (peripheral artery disease)   . Edema of both legs   . History of gout     per pt stable  . Glaucoma of both eyes   . Wears hearing aid     bilateral    Past Surgical History  Procedure Laterality Date  . Repair thoracic aorta  01/1995    s/p rupture  . Cataract extraction w/ intraocular lens  implant, bilateral  2012  . 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 Laparoscopic cholecystectomy  JAN 2005  . Percutaneous nephrostolithotomy  1970's  . Abdominal aortagram  07-16-2003  DR ROTHBART    W/ COIL EMBOLIZATION OF SIX BRANCHES OF RIGHT INTERNAL RENAL ARTERY ANEURYSM  . Cardiovascular stress test  04-25-2003    LOW RISK CARDIOLITE STUDY/ MODERATE LV DILATATION AND IMPAIRMENT OF LVSF/  NO ISCHEMIA  . Transthoracic echocardiogram  02-07-2008  DR ROTHBART    MODERATE  DILATED LV/  EF 50-55%/ MILD AR/ MILD TO MODERATE AORTIC ROOT DILATATION/ MODERATE MR/ MODERATE LA   &  RA   DIILATED/ MILD TO MODERATE TR  . Cystoscopy w/ ureteral stent placement Bilateral 10/24/2012    Procedure: CYSTOSCOPY WITH RETROGRADE PYELOGRAM/URETERAL STENT PLACEMENT;  Surgeon: Lindaann Slough, MD;  Location: [facility] [place];  Service: Urology;  Laterality: Bilateral;  . Cystoscopy with ureteroscopy Left 11/27/2012    Procedure: CYSTOSCOPY,LEFT URETEROSCOPY WITH LASER LITHOTRIPSY/REMOVAL OF MIGRATED STENT, PLACEMENT OF LEFT STENT;  Surgeon: Garnett Farm, MD;  Location: [facility];  Service: Urology;  Laterality: Left;  . Holmium laser application Left 11/27/2012    Procedure: HOLMIUM LASER APPLICATION;  Surgeon: Garnett Farm, MD;  Location: [facility];  Service:  Urology;  Laterality: Left;  . Cystoscopy with ureteroscopy and stent placement N/A 12/29/2012    Procedure: CYSTOSCOPY WITH URETEROSCOPY AND STENT PLACEMENT, removal of right and left stents, retrogrades, right stent exchange;  Surgeon: Garnett Farm, MD;  Location: [facility];  Service: Urology;  Laterality: N/A;  . Holmium laser application Right 12/29/2012    Procedure: HOLMIUM LASER APPLICATION;  Surgeon: Garnett Farm, MD;  Location: [facility];  Service: Urology;  Laterality: Right;  HLL OF (RT) URETERAL PELVIC JUNCTION STONE  . Cystoscopy with retrograde pyelogram, ureteroscopy and stent placement Right 01/29/2013    Procedure: CYSTOSCOPY WITH RIGHT URETEROSCOPY AND STENT PLACEMENT;  Surgeon: Garnett Farm, MD;  Location: [facility];  Service: Urology;  Laterality: Right;  . Holmium laser application N/A 01/29/2013    Procedure: HOLMIUM LASER APPLICATION;  Surgeon: Garnett Farm, MD;  Location: [facility];  Service: Urology;  Laterality: N/A;  . Esophagogastroduodenoscopy N/A 02/16/2013    Procedure: ESOPHAGOGASTRODUODENOSCOPY (EGD);  Surgeon: Meryl Dare, MD;  Location: [facility];  Service: Endoscopy;  Laterality: N/A;  . Cystoscopy with retrograde pyelogram, ureteroscopy and stent placement Right 05/07/2013    Procedure: CYSTOSCOPY WITH RIGHT RETROGRADE PYELOGRAM, Balloon dilation of right ureter, Laser incision of right ureter, Nephrostogram;  Surgeon: Garnett Farm, MD;  Location: [facility];  Service: Urology;  Laterality: Right;  . Holmium laser application Right 05/07/2013    Procedure: HOLMIUM LASER APPLICATION;  Surgeon: Garnett Farm, MD;  Location: [facility];  Service: Urology;  Laterality: Right;     Current Outpatient Prescriptions  Medication Sig Dispense Refill  . acetaminophen (TYLENOL) 500 MG tablet Take 1,000 mg by mouth every 6 (six) hours as needed for mild pain, moderate pain or headache.     Marland Kitchen amLODipine (NORVASC) 5 MG tablet Take 1 tablet by mouth  every morning 90 tablet 3  .  colchicine (COLCRYS) 0.6 MG tablet TAKE ONE TABLET BY MOUTH 2 TIMES A DAY FOR GOUT. 24 tablet 11  . Cyanocobalamin (VITAMIN B-12) 2000 MCG TBCR Take 2,000 mcg by mouth daily.     Marland Kitchen DIGOX 125 MCG tablet     . doxazosin (CARDURA) 4 MG tablet Take 4 mg by mouth at bedtime.    . furosemide (LASIX) 40 MG tablet Take 1 and 1/2 tablets by  mouth every morning 135 tablet 0  . latanoprost (XALATAN) 0.005 % ophthalmic solution Place 1 drop into both eyes at bedtime.     . metoprolol (LOPRESSOR) 100 MG tablet Take 1 tablet by mouth two  times daily 120 tablet 10  . simvastatin (ZOCOR) 20 MG tablet Take 1 tablet (20 mg total) by mouth daily at 6 PM. 90 tablet 3  . warfarin (COUMADIN) 2.5  MG tablet Take 2.5 mg by mouth See admin instructions. Take 2.5 mg daily EXCEPT on Wednesday. No medication on Wednesday.    . zolpidem (AMBIEN) 10 MG tablet TAKE 1 TABLET AT BEDTIME AS NEEDED FOR SLEEP. 90 tablet 1   No current facility-administered medications for this visit.    Allergies:   Sulfa antibiotics    Social History:  The patient  reports that he has never smoked. He has never used smokeless tobacco. He reports that he does not drink alcohol or use illicit drugs.   Family History:  The patient's family history includes Aneurysm in his brother; Aortic aneurysm in his brother; Diabetes in his sister; Early death in his father; Heart disease in his mother.    ROS: .   All other systems are reviewed and negative.Unless otherwise mentioned in  H&P above.   PHYSICAL EXAM: VS:  BP 98/60 mmHg  Pulse 64  Ht  (1.778 m)  Wt 193 lb 9.6 oz (87.816 kg)  BMI 27.78 kg/m2  SpO2 98% , BMI Body mass index is 27.78 kg/(m^2). GEN: Well nourished, well developed, in no acute distress HEENT: normal Neck: no JVD, carotid bruits, or masses Cardiac:RRR, tachycardic; no murmurs, rubs, or gallops,no edema  Respiratory:  Diminished in the bases, with crackles no wheezes GI: soft, nontender, nondistended, + BS MS: no  deformity or atrophy Skin: warm and dry, no rash Neuro:  Strength and sensation are intact Psych: euthymic mood, full affect GU: indwelling urinary catheter noted   Recent Labs: 04/09/2014: ALT 8; BUN 45*; Creatinine 3.28*; Hemoglobin 11.6*; Platelets 138*; Potassium 5.5*; Pro B Natriuretic peptide (BNP) 9656.0*; Sodium 142    Lipid Panel    Component Value Date/Time   CHOL 118 11/19/2013 0948   TRIG 89 11/19/2013 0948   HDL 39* 11/19/2013 0948   CHOLHDL 3.0 11/19/2013 0948   VLDL 18 11/19/2013 0948   LDLCALC 61 11/19/2013 0948      Wt Readings from Last 3 Encounters:  07/01/14 193 lb 9.6 oz (87.816 kg)  07/01/14 194 lb (87.998 kg)  06/26/14 193 lb 8 oz (87.771 kg)      ASSESSMENT AND PLAN:  1. Atrial fibrillation: Heart rate is not well controlled currently that he is slightly aggravated.  He is currently on metoprolol 100 mg twice a day for rate control and has recently been taken off of digoxin to 2 kidney disease.  Blood pressure is low at 90/60, which would prohibit him from increased dose of the Toprol at this time.  He remains on Coumadin therapy and is followed in our [place] office.  He is due to see them today.  2. Nonischemic cardiomyopathy: The patient complains of worsening breathing status.  The patient is not taking a higher dose of Lasix.  As directed, only taking 40 mg daily, instead of 60.  He is now weighing himself daily and does eat salty foods.  I have advised him against this, to get a scale and weigh daily.  His weight is up approximately 10 pounds from when he states his normal weight is.  But I do not see evidence of fluid accumulation.  O2 sat is 98%.  Dr.Lukingis ordered.  A chest x-ray and some labs.I will request copies.  And see him in one month.  I advised him that he continues to gain weight and his breathing status worsens.  He is to increase his Lasix to 60 mg,  3. Hypotension: Will decrease amlodipine to 2.5 mg daily.  4. Hyperlipidemia:  date he is said amlodipine, simvastatin, will be stopped.  We will start him on a no other statin, Pravachol, on next visit.   Current medicines are reviewed at length with the patient today.  He has numerous questions about his current diagnoses and medications.  I spent 40 minutes with this patient going over her his symptoms and medications.  . No orders of the defined types were placed in this encounter.     Disposition:   FU with 2 weeks.  Signed, Joni Reining, NP  07/01/2014 3:40 PM    [facility] [address] [place], [place] [address] Phone: [phone]; Fax: [phone]

## 2014-07-01 NOTE — Progress Notes (Signed)
   Subjective:    Patient ID: [name], male    DOB: May 10, 1925, 81 y.o.   MRN: [id number]  Diarrhea  This is a new problem. The current episode started in the past 7 days.   loose stools have been going on just for a couple days. Patient took energy drinks and this seemed to trigger them.  History of renal insufficiency. No blood work for a couple months.   Wheezing since January.   Weakness. No appetitie.  Started awhile back. Getting worse.   Requesting refill on zolpidem. States that it definitely helps him with his sleep.  Has appt with cardiologist today. Claims compliance with her medicine.  Patient notes food getting stuck at times. Requires him to bring up the rest of his meal up out of his esophagus. States not true vomiting but something similar to this. Review of Systems  Gastrointestinal: Positive for diarrhea.   slight weight loss no chest pain no headache no back pain no abdominal pain     Objective:   Physical Exam  Alert vitals stable no acute distress. Moderate malaise. Appears fatigued. Smiles appropriately. HEENT slight nasal congestion pharynx normal. Lungs no basal crackles no true wheezes no tachypnea are degraded rhythm. Ankles without edema.      Assessment & Plan:  Impression 1 progressive fatigue discussed #2 solid food dysphagia time for yet another referral. #3 history CHF cardiomyopathy and A. fib due to see cardiologist soon. #4 dizziness missed ENT referral. Now has ENT appointment in [place] but requests a refill date #5 general debility discussed with patient today. He states still able to live alone. Plan follow-up in a couple months. Appropriate blood work. Press on with cardiology visit. GI referral. WSL

## 2014-07-01 NOTE — Patient Instructions (Addendum)
Your physician recommends that you schedule a follow-up appointment in: 2 weeks with Joni Reining, NP  Stop taking :  Digox Simvastatin  Decrease Amlodipine to 2.5 mg Daily  Please have a copy of your lab results sent to our office.   Thank you for choosing [facility]!

## 2014-07-02 ENCOUNTER — Other Ambulatory Visit: Payer: Self-pay

## 2014-07-02 DIAGNOSIS — R1310 Dysphagia, unspecified: Secondary | ICD-10-CM

## 2014-07-03 ENCOUNTER — Inpatient Hospital Stay (HOSPITAL_COMMUNITY): Payer: Medicare Other

## 2014-07-03 ENCOUNTER — Encounter: Payer: Self-pay | Admitting: Internal Medicine

## 2014-07-03 ENCOUNTER — Encounter (HOSPITAL_COMMUNITY): Payer: Self-pay

## 2014-07-03 ENCOUNTER — Emergency Department (HOSPITAL_COMMUNITY): Payer: Medicare Other

## 2014-07-03 ENCOUNTER — Inpatient Hospital Stay (HOSPITAL_COMMUNITY)
Admission: EM | Admit: 2014-07-03 | Discharge: 2014-07-04 | DRG: 683 | Discharge status: Against Medical Advice | Payer: Medicare Other | Attending: Internal Medicine | Admitting: Internal Medicine

## 2014-07-03 DIAGNOSIS — D649 Anemia, unspecified: Secondary | ICD-10-CM | POA: Diagnosis present

## 2014-07-03 DIAGNOSIS — H409 Unspecified glaucoma: Secondary | ICD-10-CM | POA: Diagnosis present

## 2014-07-03 DIAGNOSIS — M109 Gout, unspecified: Secondary | ICD-10-CM | POA: Diagnosis present

## 2014-07-03 DIAGNOSIS — Z7901 Long term (current) use of anticoagulants: Secondary | ICD-10-CM

## 2014-07-03 DIAGNOSIS — E861 Hypovolemia: Secondary | ICD-10-CM | POA: Diagnosis present

## 2014-07-03 DIAGNOSIS — I509 Heart failure, unspecified: Secondary | ICD-10-CM

## 2014-07-03 DIAGNOSIS — D696 Thrombocytopenia, unspecified: Secondary | ICD-10-CM | POA: Diagnosis present

## 2014-07-03 DIAGNOSIS — E860 Dehydration: Secondary | ICD-10-CM | POA: Diagnosis present

## 2014-07-03 DIAGNOSIS — Z9842 Cataract extraction status, left eye: Secondary | ICD-10-CM

## 2014-07-03 DIAGNOSIS — N132 Hydronephrosis with renal and ureteral calculous obstruction: Secondary | ICD-10-CM | POA: Diagnosis present

## 2014-07-03 DIAGNOSIS — Z9841 Cataract extraction status, right eye: Secondary | ICD-10-CM | POA: Diagnosis not present

## 2014-07-03 DIAGNOSIS — N170 Acute kidney failure with tubular necrosis: Principal | ICD-10-CM | POA: Diagnosis present

## 2014-07-03 DIAGNOSIS — I482 Chronic atrial fibrillation, unspecified: Secondary | ICD-10-CM | POA: Diagnosis present

## 2014-07-03 DIAGNOSIS — N183 Chronic kidney disease, stage 3 unspecified: Secondary | ICD-10-CM | POA: Diagnosis present

## 2014-07-03 DIAGNOSIS — Z961 Presence of intraocular lens: Secondary | ICD-10-CM | POA: Diagnosis present

## 2014-07-03 DIAGNOSIS — I129 Hypertensive chronic kidney disease with stage 1 through stage 4 chronic kidney disease, or unspecified chronic kidney disease: Secondary | ICD-10-CM | POA: Diagnosis present

## 2014-07-03 DIAGNOSIS — I5021 Acute systolic (congestive) heart failure: Secondary | ICD-10-CM

## 2014-07-03 DIAGNOSIS — G629 Polyneuropathy, unspecified: Secondary | ICD-10-CM | POA: Diagnosis present

## 2014-07-03 DIAGNOSIS — R531 Weakness: Secondary | ICD-10-CM

## 2014-07-03 DIAGNOSIS — I5032 Chronic diastolic (congestive) heart failure: Secondary | ICD-10-CM | POA: Diagnosis present

## 2014-07-03 DIAGNOSIS — N184 Chronic kidney disease, stage 4 (severe): Secondary | ICD-10-CM | POA: Diagnosis present

## 2014-07-03 DIAGNOSIS — N179 Acute kidney failure, unspecified: Secondary | ICD-10-CM | POA: Diagnosis not present

## 2014-07-03 DIAGNOSIS — N189 Chronic kidney disease, unspecified: Secondary | ICD-10-CM | POA: Diagnosis present

## 2014-07-03 DIAGNOSIS — Z79899 Other long term (current) drug therapy: Secondary | ICD-10-CM

## 2014-07-03 DIAGNOSIS — E785 Hyperlipidemia, unspecified: Secondary | ICD-10-CM | POA: Diagnosis present

## 2014-07-03 DIAGNOSIS — I872 Venous insufficiency (chronic) (peripheral): Secondary | ICD-10-CM | POA: Diagnosis present

## 2014-07-03 DIAGNOSIS — M1990 Unspecified osteoarthritis, unspecified site: Secondary | ICD-10-CM | POA: Diagnosis present

## 2014-07-03 LAB — CBC WITH DIFFERENTIAL/PLATELET
BASOS PCT: 0 % (ref 0–1)
Basophils Absolute: 0 10*3/uL (ref 0.0–0.1)
EOS ABS: 0.1 10*3/uL (ref 0.0–0.7)
EOS PCT: 1 % (ref 0–5)
HCT: 36.7 % — ABNORMAL LOW (ref 39.0–52.0)
HEMOGLOBIN: 11.6 g/dL — AB (ref 13.0–17.0)
LYMPHS PCT: 11 % — AB (ref 12–46)
Lymphs Abs: 0.6 10*3/uL — ABNORMAL LOW (ref 0.7–4.0)
MCH: 30.4 pg (ref 26.0–34.0)
MCHC: 31.6 g/dL (ref 30.0–36.0)
MCV: 96.3 fL (ref 78.0–100.0)
MONO ABS: 0.7 10*3/uL (ref 0.1–1.0)
Monocytes Relative: 13 % — ABNORMAL HIGH (ref 3–12)
NEUTROS ABS: 3.8 10*3/uL (ref 1.7–7.7)
Neutrophils Relative %: 75 % (ref 43–77)
Platelets: 81 10*3/uL — ABNORMAL LOW (ref 150–400)
RBC: 3.81 MIL/uL — AB (ref 4.22–5.81)
RDW: 17 % — ABNORMAL HIGH (ref 11.5–15.5)
WBC: 5.1 10*3/uL (ref 4.0–10.5)

## 2014-07-03 LAB — HEPATIC FUNCTION PANEL
ALBUMIN: 3.7 g/dL (ref 3.5–5.2)
ALK PHOS: 58 U/L (ref 39–117)
ALT: 14 U/L (ref 0–53)
AST: 19 U/L (ref 0–37)
BILIRUBIN DIRECT: 0.3 mg/dL (ref 0.0–0.5)
BILIRUBIN INDIRECT: 0.7 mg/dL (ref 0.3–0.9)
TOTAL PROTEIN: 6.5 g/dL (ref 6.0–8.3)
Total Bilirubin: 1 mg/dL (ref 0.3–1.2)

## 2014-07-03 LAB — BASIC METABOLIC PANEL
Anion gap: 9 (ref 5–15)
BUN: 113 mg/dL — AB (ref 6–23)
CHLORIDE: 112 mmol/L (ref 96–112)
CO2: 20 mmol/L (ref 19–32)
Calcium: 8.7 mg/dL (ref 8.4–10.5)
Creatinine, Ser: 5.32 mg/dL — ABNORMAL HIGH (ref 0.50–1.35)
GFR calc Af Amer: 10 mL/min — ABNORMAL LOW (ref >90)
GFR calc non Af Amer: 9 mL/min — ABNORMAL LOW (ref >90)
Glucose, Bld: 82 mg/dL (ref 70–99)
Potassium: 5.1 mmol/L (ref 3.5–5.1)
Sodium: 141 mmol/L (ref 135–145)

## 2014-07-03 LAB — TROPONIN I
Troponin I: <0.03 ng/mL (ref <0.031)
Troponin I: <0.03 ng/mL (ref <0.031)

## 2014-07-03 LAB — PROTIME-INR
INR: 4.64 — ABNORMAL HIGH (ref 0.00–1.49)
PROTHROMBIN TIME: 47.9 s — AB (ref 11.6–15.2)

## 2014-07-03 LAB — CK: CK TOTAL: 126 U/L (ref 7–232)

## 2014-07-03 LAB — BRAIN NATRIURETIC PEPTIDE: B Natriuretic Peptide: 740 pg/mL — ABNORMAL HIGH (ref 0.0–100.0)

## 2014-07-03 MED ORDER — VITAMIN K1 1 MG/0.5ML IJ SOLN
INTRAMUSCULAR | Status: AC
  Filled 2014-07-03: qty 0.5

## 2014-07-03 MED ORDER — SODIUM CHLORIDE 0.9 % IV SOLN
INTRAVENOUS | Status: DC
  Administered 2014-07-03: 19:00:00 via INTRAVENOUS

## 2014-07-03 MED ORDER — TRAMADOL HCL 50 MG PO TABS
50.0000 mg | ORAL_TABLET | Freq: Every day | ORAL | Status: DC | PRN
Start: 2014-07-03 — End: 2014-07-04

## 2014-07-03 MED ORDER — SODIUM CHLORIDE 0.9 % IV BOLUS (SEPSIS)
250.0000 mL | Freq: Once | INTRAVENOUS | Status: AC
  Administered 2014-07-03: 250 mL via INTRAVENOUS

## 2014-07-03 MED ORDER — VITAMIN K1 10 MG/ML IJ SOLN
5.0000 mg | Freq: Once | INTRAVENOUS | Status: AC
  Administered 2014-07-03: 5 mg via INTRAVENOUS
  Filled 2014-07-03: qty 0.5

## 2014-07-03 MED ORDER — ACETAMINOPHEN 325 MG PO TABS: 650.0000 mg | ORAL_TABLET | Freq: Four times a day (QID) | ORAL | Status: DC | PRN

## 2014-07-03 MED ORDER — HYDROCODONE-ACETAMINOPHEN 5-325 MG PO TABS: 1.0000 | ORAL_TABLET | ORAL | Status: DC | PRN

## 2014-07-03 MED ORDER — COLCHICINE 0.6 MG PO TABS: 0.6000 mg | ORAL_TABLET | Freq: Two times a day (BID) | ORAL | Status: DC

## 2014-07-03 MED ORDER — LATANOPROST 0.005 % OP SOLN
1.0000 [drp] | Freq: Every day | OPHTHALMIC | Status: DC
  Filled 2014-07-03: qty 2.5

## 2014-07-03 MED ORDER — SODIUM BICARBONATE 650 MG PO TABS
650.0000 mg | ORAL_TABLET | Freq: Three times a day (TID) | ORAL | Status: DC
  Administered 2014-07-03 – 2014-07-04 (×3): 650 mg via ORAL
  Filled 2014-07-03 (×3): qty 1

## 2014-07-03 MED ORDER — FUROSEMIDE 10 MG/ML IJ SOLN
40.0000 mg | Freq: Two times a day (BID) | INTRAMUSCULAR | Status: DC
  Administered 2014-07-03 – 2014-07-04 (×2): 40 mg via INTRAVENOUS
  Filled 2014-07-03 (×2): qty 4

## 2014-07-03 MED ORDER — ALUM & MAG HYDROXIDE-SIMETH 200-200-20 MG/5ML PO SUSP: 30.0000 mL | Freq: Four times a day (QID) | ORAL | Status: DC | PRN

## 2014-07-03 MED ORDER — ONDANSETRON HCL 4 MG/2ML IJ SOLN: 4.0000 mg | Freq: Four times a day (QID) | INTRAMUSCULAR | Status: DC | PRN

## 2014-07-03 MED ORDER — ONDANSETRON HCL 4 MG PO TABS: 4.0000 mg | ORAL_TABLET | Freq: Four times a day (QID) | ORAL | Status: DC | PRN

## 2014-07-03 MED ORDER — ACETAMINOPHEN 650 MG RE SUPP: 650.0000 mg | Freq: Four times a day (QID) | RECTAL | Status: DC | PRN

## 2014-07-03 MED ORDER — POLYETHYLENE GLYCOL 3350 17 G PO PACK
17.0000 g | PACK | Freq: Every day | ORAL | Status: DC | PRN
  Filled 2014-07-03: qty 1

## 2014-07-03 NOTE — ED Notes (Signed)
C.o fatigue since January and decreased in appetite. Denies any pain. A/o x3.

## 2014-07-03 NOTE — ED Provider Notes (Signed)
CSN: [id number]     Arrival date & time 07/03/14  1027 History  This chart was scribed for Bethann Berkshire, MD by Luisa Dago, ED Scribe. This patient was seen in room APA14/APA14 and the patient's care was started at 11:52 AM.    Chief Complaint  Patient presents with  . Fatigue   Patient is a 81 y.o. male presenting with weakness. The history is provided by the patient and medical records. No language interpreter was used.  Weakness This is a new problem. The current episode started more than 1 week ago. The problem occurs constantly. The problem has not changed since onset.Pertinent negatives include no chest pain, no abdominal pain, no headaches and no shortness of breath. Nothing aggravates the symptoms. Nothing relieves the symptoms. He has tried nothing for the symptoms.   HPI Comments: [name] is a 81 y.o. male who presents to the Emergency Department complaining of gradual onset persistent fatigue which started approximately 3 months ago. He states that he has been feeling run down and is unsure of what is going on with him. Pt denies any fever, neck pain, sore throat, visual disturbance, CP, cough, SOB, abdominal pain, nausea, emesis, diarrhea, urinary symptoms, back pain, HA, numbness, and rash as associated symptoms.    PCP: [name], MD   Past Medical History  Diagnosis Date  . Hyperlipidemia   . White coat hypertension   . Cardiomyopathy      EF:30-40% in 06/1998, but 50-55% in 2009;  HX ISCHEMIC CARDIOMYOPATHY  . Chronic atrial fibrillation   . Vitamin B12 deficiency   . Chronic anticoagulation   . Arthritis   . Sensory neuropathy   . Chronic kidney disease, stage III (moderate)     creatinine 1.5 2001, 2.0 2009, 2.28 in 12/2009  . Mild renal insufficiency   . Hydronephrosis, bilateral   . Ureteral calculi     BILATERAL  . History of CHF (congestive heart failure)     1999  &  2005  . History of kidney stones   . Bilateral renal cysts   . Aneurysm of  infrarenal abdominal aorta 3.9 x 3.6 no change 8/05 no change in 2010;  4.7 in 2013    MONITORED BY [facility]-- LAST VISIT OCT 2013--  4.7CM  . Chronic venous insufficiency   . PAD (peripheral artery disease)   . Edema of both legs   . History of gout     per pt stable  . Glaucoma of both eyes   . Wears hearing aid     bilateral   Past Surgical History  Procedure Laterality Date  . Repair thoracic aorta  01/1995    s/p rupture  . Cataract extraction w/ intraocular lens  implant, bilateral  2012  . 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 Laparoscopic cholecystectomy  JAN 2005  . Percutaneous nephrostolithotomy  1970's  . Abdominal aortagram  07-16-2003  DR ROTHBART    W/ COIL EMBOLIZATION OF SIX BRANCHES OF RIGHT INTERNAL RENAL ARTERY ANEURYSM  . Cardiovascular stress test  04-25-2003    LOW RISK CARDIOLITE STUDY/ MODERATE LV DILATATION AND IMPAIRMENT OF LVSF/  NO ISCHEMIA  . Transthoracic echocardiogram  02-07-2008  DR ROTHBART    MODERATE DILATED LV/  EF 50-55%/ MILD AR/ MILD TO MODERATE AORTIC ROOT DILATATION/ MODERATE MR/ MODERATE LA   &  RA   DIILATED/ MILD TO MODERATE TR  . Cystoscopy w/ ureteral stent placement Bilateral 10/24/2012  Procedure: CYSTOSCOPY WITH RETROGRADE PYELOGRAM/URETERAL STENT PLACEMENT;  Surgeon: Lindaann Slough, MD;  Location: [facility] [place];  Service: Urology;  Laterality: Bilateral;  . Cystoscopy with ureteroscopy Left 11/27/2012    Procedure: CYSTOSCOPY,LEFT URETEROSCOPY WITH LASER LITHOTRIPSY/REMOVAL OF MIGRATED STENT, PLACEMENT OF LEFT STENT;  Surgeon: Garnett Farm, MD;  Location: [facility];  Service: Urology;  Laterality: Left;  . Holmium laser application Left 11/27/2012    Procedure: HOLMIUM LASER APPLICATION;  Surgeon: Garnett Farm, MD;  Location: [facility];  Service: Urology;  Laterality: Left;  . Cystoscopy with ureteroscopy and stent placement N/A 12/29/2012    Procedure: CYSTOSCOPY WITH  URETEROSCOPY AND STENT PLACEMENT, removal of right and left stents, retrogrades, right stent exchange;  Surgeon: Garnett Farm, MD;  Location: [facility];  Service: Urology;  Laterality: N/A;  . Holmium laser application Right 12/29/2012    Procedure: HOLMIUM LASER APPLICATION;  Surgeon: Garnett Farm, MD;  Location: [facility];  Service: Urology;  Laterality: Right;  HLL OF (RT) URETERAL PELVIC JUNCTION STONE  . Cystoscopy with retrograde pyelogram, ureteroscopy and stent placement Right 01/29/2013    Procedure: CYSTOSCOPY WITH RIGHT URETEROSCOPY AND STENT PLACEMENT;  Surgeon: Garnett Farm, MD;  Location: [facility];  Service: Urology;  Laterality: Right;  . Holmium laser application N/A 01/29/2013    Procedure: HOLMIUM LASER APPLICATION;  Surgeon: Garnett Farm, MD;  Location: [facility];  Service: Urology;  Laterality: N/A;  . Esophagogastroduodenoscopy N/A 02/16/2013    Procedure: ESOPHAGOGASTRODUODENOSCOPY (EGD);  Surgeon: Meryl Dare, MD;  Location: [facility];  Service: Endoscopy;  Laterality: N/A;  . Cystoscopy with retrograde pyelogram, ureteroscopy and stent placement Right 05/07/2013    Procedure: CYSTOSCOPY WITH RIGHT RETROGRADE PYELOGRAM, Balloon dilation of right ureter, Laser incision of right ureter, Nephrostogram;  Surgeon: Garnett Farm, MD;  Location: [facility];  Service: Urology;  Laterality: Right;  . Holmium laser application Right 05/07/2013    Procedure: HOLMIUM LASER APPLICATION;  Surgeon: Garnett Farm, MD;  Location: [facility];  Service: Urology;  Laterality: Right;   Family History  Problem Relation Age of Onset  . Heart disease Mother   . Early death Father   . Aneurysm Brother   . Aortic aneurysm Brother   . Diabetes Sister    History  Substance Use Topics  . Smoking status: Never Smoker   . Smokeless tobacco: Never Used  . Alcohol Use: No    Review of Systems  Constitutional: Positive for fatigue. Negative for appetite change.  HENT: Negative for congestion, ear  discharge and sinus pressure.   Eyes: Negative for discharge.  Respiratory: Negative for cough and shortness of breath.   Cardiovascular: Negative for chest pain.  Gastrointestinal: Negative for abdominal pain and diarrhea.  Genitourinary: Negative for frequency and hematuria.  Musculoskeletal: Negative for back pain.  Skin: Negative for rash.  Neurological: Positive for weakness (generalized). Negative for seizures and headaches.  Psychiatric/Behavioral: Negative for hallucinations.   Allergies  Sulfa antibiotics  Home Medications   Prior to Admission medications   Medication Sig Start Date End Date Taking? Authorizing Provider  acetaminophen (TYLENOL) 500 MG tablet Take 1,000 mg by mouth every 6 (six) hours as needed for mild pain, moderate pain or headache.    Yes Historical Provider, MD  amLODipine (NORVASC) 2.5 MG tablet Take 1 tablet (2.5 mg total) by mouth daily. 07/01/14  Yes Jodelle Gross, NP  colchicine (COLCRYS) 0.6 MG tablet TAKE ONE TABLET BY MOUTH 2 TIMES A DAY FOR  GOUT. 12/24/13  Yes Merlyn Albert, MD  Cyanocobalamin (VITAMIN B-12) 2000 MCG TBCR Take 2,000 mcg by mouth daily.    Yes Historical Provider, MD  furosemide (LASIX) 40 MG tablet Take 1 and 1/2 tablets by  mouth every morning Patient taking differently: take 1 tablet by mouth every morning. 05/21/14  Yes Merlyn Albert, MD  latanoprost (XALATAN) 0.005 % ophthalmic solution Place 1 drop into both eyes at bedtime.  08/12/12  Yes Historical Provider, MD  metoprolol (LOPRESSOR) 100 MG tablet Take 1 tablet by mouth two  times daily 03/19/14  Yes Antoine Poche, MD  traMADol (ULTRAM) 50 MG tablet Take 50 mg by mouth daily as needed for moderate pain.   Yes Historical Provider, MD  warfarin (COUMADIN) 2.5 MG tablet Take 2.5 mg by mouth See admin instructions. Per 3/7 visit, Hold coumadin 3/7 and 3/8. Take 1/2 on Wednesday 3/9,  then resume 1/2 tablet daily except none on Wednesdays   Yes Historical Provider, MD   zolpidem (AMBIEN) 10 MG tablet TAKE 1 TABLET AT BEDTIME AS NEEDED FOR SLEEP. 07/01/14  Yes Merlyn Albert, MD   BP 97/60 mmHg  Pulse 75  Temp(Src) 97.8 F (36.6 C) (Oral)  Resp 18  Wt 190 lb (86.183 kg)  SpO2 96%  Physical Exam  Constitutional: He is oriented to person, place, and time. He appears well-developed.  HENT:  Head: Normocephalic.  Eyes: Conjunctivae and EOM are normal. No scleral icterus.  Neck: Neck supple. No thyromegaly present.  Cardiovascular: Normal rate and regular rhythm.  Exam reveals no gallop and no friction rub.   No murmur heard. Pulmonary/Chest: No stridor. He has no wheezes. He has no rales. He exhibits no tenderness.  Abdominal: He exhibits no distension. There is no tenderness. There is no rebound.  Musculoskeletal: Normal range of motion. He exhibits no edema.  Lymphadenopathy:    He has no cervical adenopathy.  Neurological: He is oriented to person, place, and time. He exhibits normal muscle tone. Coordination normal.  General weakness.  Skin: No rash noted. No erythema.  Psychiatric: He has a normal mood and affect. His behavior is normal.    ED Course  Procedures (including critical care time)  DIAGNOSTIC STUDIES: Oxygen Saturation is 96% on RA, adequate by my interpretation.    COORDINATION OF CARE: 11:55 AM- Pt advised of plan for treatment and pt agrees.  Labs Review Labs Reviewed  CBC WITH DIFFERENTIAL/PLATELET - Abnormal; Notable for the following:    RBC 3.81 (*)    Hemoglobin 11.6 (*)    HCT 36.7 (*)    RDW 17.0 (*)    Platelets 81 (*)    Lymphocytes Relative 11 (*)    Lymphs Abs 0.6 (*)    Monocytes Relative 13 (*)    All other components within normal limits  BASIC METABOLIC PANEL  PROTIME-INR    Imaging Review No results found.   EKG Interpretation   Date/Time:  Wednesday July 03 2014 12:04:55 EST Ventricular Rate:  96 PR Interval:  170 QRS Duration: 119 QT Interval:  354 QTC Calculation: 447 R Axis:    104 Text Interpretation:  Unknown rhythm, irregular rate Nonspecific  intraventricular conduction delay Repol abnrm suggests ischemia,  anterolateral Confirmed by Ashaki Frosch  MD, Dameshia Seybold [phone]) on 07/03/2014 1:50:12  PM      MDM   Final diagnoses:  None  admit chf and renal insuf  The chart was scribed for me under my direct supervision.  I personally performed  the history, physical, and medical decision making and all procedures in the evaluation of this patient.Bethann Berkshire, MD 07/03/14 1455

## 2014-07-03 NOTE — Consult Note (Signed)
GILMAR MUCKLOW MRN: [id number] DOB/AGE: [birth year] 81 y.o. Primary Care Physician:LUKING,W S, MD Admit date: 07/03/2014 Chief Complaint:  Chief Complaint  Patient presents with  . Fatigue   HPI: Pt is 81 year old male with past medical hx of HTn who came to ER with c/o fatigue.  HPI dates back to 1-2 weeks ago when pt started feeling tired, this feeling was progressive. Pt c/o decreased appetite Pt c/o malaise Pt says " I am the one who never feels sick " NO c/o chest pain No c/o fever/cough/chills NO c/o nausea/vomiting/abdominal pain NO c/o syncope Pt gives hx of having had 7 procedures for removing stones    Past Medical History  Diagnosis Date  . Hyperlipidemia   . White coat hypertension   . Cardiomyopathy      EF:30-40% in 06/1998, but 50-55% in 2009;  HX ISCHEMIC CARDIOMYOPATHY  . Chronic atrial fibrillation   . Vitamin B12 deficiency   . Chronic anticoagulation   . Arthritis   . Sensory neuropathy   . Chronic kidney disease, stage III (moderate)     creatinine 1.5 2001, 2.0 2009, 2.28 in 12/2009  . Mild renal insufficiency   . Hydronephrosis, bilateral   . Ureteral calculi     BILATERAL  . History of CHF (congestive heart failure)     1999  &  2005  . History of kidney stones   . Bilateral renal cysts   . 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 Chronic venous insufficiency   . PAD (peripheral artery disease)   . Edema of both legs   . History of gout     per pt stable  . Glaucoma of both eyes   . Wears hearing aid     bilateral      Family History  Problem Relation Age of Onset  . Heart disease Mother   . Early death Father   . Aneurysm Brother   . Aortic aneurysm Brother   . Diabetes Sister     Social History:  reports that he has never smoked. He has never used smokeless tobacco. He reports that he does not drink alcohol or use illicit  drugs.   Allergies:  Allergies  Allergen Reactions  . Sulfa Antibiotics Rash    Nausea     Medications Prior to Admission  Medication Sig Dispense Refill  . acetaminophen (TYLENOL) 500 MG tablet Take 1,000 mg by mouth every 6 (six) hours as needed for mild pain, moderate pain or headache.     Marland Kitchen amLODipine (NORVASC) 2.5 MG tablet Take 1 tablet (2.5 mg total) by mouth daily. 90 tablet 3  . colchicine (COLCRYS) 0.6 MG tablet TAKE ONE TABLET BY MOUTH 2 TIMES A DAY FOR GOUT. 24 tablet 11  . Cyanocobalamin (VITAMIN B-12) 2000 MCG TBCR Take 2,000 mcg by mouth daily.     . furosemide (LASIX) 40 MG tablet Take 1 and 1/2 tablets by  mouth every morning (Patient taking differently: take 1 tablet by mouth every morning.) 135 tablet 0  . latanoprost (XALATAN) 0.005 % ophthalmic solution Place 1 drop into both eyes at bedtime.     . metoprolol (LOPRESSOR) 100 MG tablet Take 1 tablet by mouth two  times daily 120 tablet 10  . traMADol (ULTRAM) 50 MG tablet Take 50 mg by mouth daily as needed for moderate  pain.    . warfarin (COUMADIN) 2.5 MG tablet Take 2.5 mg by mouth See admin instructions. Per 3/7 visit, Hold coumadin 3/7 and 3/8. Take 1/2 on Wednesday 3/9,  then resume 1/2 tablet daily except none on Wednesdays    . zolpidem (AMBIEN) 10 MG tablet TAKE 1 TABLET AT BEDTIME AS NEEDED FOR SLEEP. 90 tablet 1       ZOX:WRUEA from the symptoms mentioned above,there are no other symptoms referable to all systems reviewed.  . colchicine  0.6 mg Oral BID  . latanoprost  1 drop Both Eyes QHS       Physical Exam: Vital signs in last 24 hours: Temp:  [97.8 F (36.6 C)] 97.8 F (36.6 C) (03/09 1033) Pulse Rate:  [70-75] 70 (03/09 1554) Resp:  [17-23] 18 (03/09 1554) BP: (96-102)/(60-68) 96/66 mmHg (03/09 1554) SpO2:  [91 %-96 %] 91 % (03/09 1554) Weight:  [190 lb (86.183 kg)] 190 lb (86.183 kg) (03/09 1035) Weight change:     Intake/Output from previous day:       Physical  Exam: General- pt is awake,alert, oriented to time place and person Resp- No acute REsp distress, CTA B/L NO Rhonchi CVS- S1S2 regular in rate and rhythm GIT- BS+, soft, NT, ND, left side lumbar region nephrostomy tube insitu. EXT- tarce LE Edema, No Cyanosis CNS- CN 2-12 grossly intact. Moving all 4 extremities Psych- normal mood and affect    Lab Results: CBC  Recent Labs  07/03/14 1101  WBC 5.1  HGB 11.6*  HCT 36.7*  PLT 81*    BMET  Recent Labs  07/03/14 1101  NA 141  K 5.1  CL 112  CO2 20  GLUCOSE 82  BUN 113*  CREATININE 5.32*  CALCIUM 8.7   Trend  2016 5.32  2015 2.1--3.4  2014 2.1--5.5  2012  2.37  2011  2.1--2.3  2010  1.8   MICRO No results found for this or any previous visit (from the past 240 hour(s)).    Lab Results  Component Value Date   CALCIUM 8.7 07/03/2014   CAION 1.08* 10/24/2012      Impression: 1)Renal  AKI secondary to prerenal/ATN/post renal                AKI sec to Hypotension/Hypovolemia/Post renal               AKI on CKD               CKD stage 4.               CKD since 2010               CKD secondary to post renal obstructive uropathy/ HTN/ Age asso decline/multiple AKI               Progression of CKD makred with AKI                2)CVS- hemodynamically fragile Medication- On Calcium Channel Blockers On Beta blockers    3)Anemia HGb at goal (9--11)   4)CKD Mineral-Bone Disorder  Phosphorus will check Calcium at goal .  5)Urology Hx of Hydronephrosis Hx of nephrostomy tube with multiple exchanges   6)Electrolytes Normokalemic NOrmonatremic   7)Acid base Co2 not at goal     Plan:  Will suggest to get CT abdo to see if tube dislodged/hydronephrosis.  Will suggest to give IVF Ns @ 57ml/hr  Will suggest to get 2 echo  Will suggest to  start sodium bicarb   po tid  For acidosis  will suggest to follow bmet  Will ask for Phos in am   Will suggest anemia work up  I had  extensive discussion with pt about possible need of renal replacement therapy.  Pt adamant that he does not want dialysis at any cost.Pt says " I am willing to accept death as I am 81 years old but will not opt for dialysis."      BHUTANI,MANPREET S 07/03/2014, 5:13 PM

## 2014-07-03 NOTE — ED Notes (Signed)
Report given to [facility] on Dept 300. All questions answered.

## 2014-07-03 NOTE — ED Notes (Signed)
Son reports pt started taking sleeping pill around same time of symptoms starting.

## 2014-07-03 NOTE — H&P (Signed)
[facility] History and Physical  [name] VWU:[id number] DOB: [birth year] DOA: 07/03/2014  Referring physician:  PCP: Edward Asa, MD   Chief Complaint:   HGeneralized weaknessPI: [name] is a very pleasant  81 y.o. male  with a past medical history of A. fib on Coumadin  easily taken off dig due to renal failure, , hypertension, Rondec kidney failure stage III, kidney stones status post stent on right presents to the emergency department with chief complaint of generalized weakness. Initial evaluation in the emergency department reveals worsening kidney failure with some dehydration.  Reports over the last 6 weeks he has experienced intermittent weakness. He states that yesterday morning he was so weak he was unable to get out of bed and he slid to the floor and lay there until 5:00 in the evening he was unable to get up. He denies loss of consciousness or injury. He reports his appetite is waxed and waned but he feels like he's been taken in fluids adequately. He denies fever chills abdominal pain nausea vomiting diarrhea constipation. He denies dysuria hematuria frequency or urgency. Denies shortness of breath headache dizziness syncope or near-syncope. He has seen his primary care provider and cardiology nurse practitioner this week results of their evaluations are unremarkable.  Workup in the emergency department includes a complete blood count with a hemoglobin of 11.6 platelets 81  Basic metabolic panel significant for creatinine of 5.32 BUN 113. INR is 4.64. Chest x-ray reveals worsened right effusion and basilar airspace disease. In the emergency department his blood pressure is on the low end of normal he slightly tachycardic he is not hypoxic In the emergency department he is given 250 mils of normal saline intravenously.  Review of Systems:  10 point review of systems complete and all systems are negative except as indicated in the history of present illness  Past Medical  History  Diagnosis Date  . Hyperlipidemia   . White coat hypertension   . Cardiomyopathy      EF:30-40% in 06/1998, but 50-55% in 2009;  HX ISCHEMIC CARDIOMYOPATHY  . Chronic atrial fibrillation   . Vitamin B12 deficiency   . Chronic anticoagulation   . Arthritis   . Sensory neuropathy   . Chronic kidney disease, stage III (moderate)     creatinine 1.5 2001, 2.0 2009, 2.28 in 12/2009  . Mild renal insufficiency   . Hydronephrosis, bilateral   . Ureteral calculi     BILATERAL  . History of CHF (congestive heart failure)     1999  &  2005  . History of kidney stones   . Bilateral renal cysts   . 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 Chronic venous insufficiency   . PAD (peripheral artery disease)   . Edema of both legs   . History of gout     per pt stable  . Glaucoma of both eyes   . Wears hearing aid     bilateral   Past Surgical History  Procedure Laterality Date  . Repair thoracic aorta  01/1995    s/p rupture  . Cataract extraction w/ intraocular lens  implant, bilateral  2012  . 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 Laparoscopic cholecystectomy  JAN 2005  . Percutaneous nephrostolithotomy  1970's  . Abdominal aortagram  07-16-2003  DR ROTHBART    W/ COIL EMBOLIZATION OF SIX BRANCHES OF RIGHT INTERNAL RENAL ARTERY ANEURYSM  . Cardiovascular stress test  04-25-2003    LOW RISK CARDIOLITE STUDY/ MODERATE LV DILATATION AND IMPAIRMENT OF LVSF/  NO ISCHEMIA  . Transthoracic echocardiogram  02-07-2008  DR ROTHBART    MODERATE DILATED LV/  EF 50-55%/ MILD AR/ MILD TO MODERATE AORTIC ROOT DILATATION/ MODERATE MR/ MODERATE LA   &  RA   DIILATED/ MILD TO MODERATE TR  . Cystoscopy w/ ureteral stent placement Bilateral 10/24/2012    Procedure: CYSTOSCOPY WITH RETROGRADE PYELOGRAM/URETERAL STENT PLACEMENT;  Surgeon:  Lindaann Slough, MD;  Location: [facility] [place];  Service: Urology;  Laterality: Bilateral;  . Cystoscopy with ureteroscopy Left 11/27/2012    Procedure: CYSTOSCOPY,LEFT URETEROSCOPY WITH LASER LITHOTRIPSY/REMOVAL OF MIGRATED STENT, PLACEMENT OF LEFT STENT;  Surgeon: Garnett Farm, MD;  Location: [facility];  Service: Urology;  Laterality: Left;  . Holmium laser application Left 11/27/2012    Procedure: HOLMIUM LASER APPLICATION;  Surgeon: Garnett Farm, MD;  Location: [facility];  Service: Urology;  Laterality: Left;  . Cystoscopy with ureteroscopy and stent placement N/A 12/29/2012    Procedure: CYSTOSCOPY WITH URETEROSCOPY AND STENT PLACEMENT, removal of right and left stents, retrogrades, right stent exchange;  Surgeon: Garnett Farm, MD;  Location: [facility];  Service: Urology;  Laterality: N/A;  . Holmium laser application Right 12/29/2012    Procedure: HOLMIUM LASER APPLICATION;  Surgeon: Garnett Farm, MD;  Location: [facility];  Service: Urology;  Laterality: Right;  HLL OF (RT) URETERAL PELVIC JUNCTION STONE  . Cystoscopy with retrograde pyelogram, ureteroscopy and stent placement Right 01/29/2013    Procedure: CYSTOSCOPY WITH RIGHT URETEROSCOPY AND STENT PLACEMENT;  Surgeon: Garnett Farm, MD;  Location: [facility];  Service: Urology;  Laterality: Right;  . Holmium laser application N/A 01/29/2013    Procedure: HOLMIUM LASER APPLICATION;  Surgeon: Garnett Farm, MD;  Location: [facility];  Service: Urology;  Laterality: N/A;  . Esophagogastroduodenoscopy N/A 02/16/2013    Procedure: ESOPHAGOGASTRODUODENOSCOPY (EGD);  Surgeon: Meryl Dare, MD;  Location: [facility];  Service: Endoscopy;  Laterality: N/A;  . Cystoscopy with retrograde pyelogram, ureteroscopy and stent placement Right 05/07/2013    Procedure: CYSTOSCOPY WITH RIGHT RETROGRADE PYELOGRAM, Balloon dilation of right ureter, Laser incision of right ureter, Nephrostogram;  Surgeon: Garnett Farm, MD;  Location: [facility];  Service: Urology;   Laterality: Right;  . Holmium laser application Right 05/07/2013    Procedure: HOLMIUM LASER APPLICATION;  Surgeon: Garnett Farm, MD;  Location: [facility];  Service: Urology;  Laterality: Right;   Social History:  reports that he has never smoked. He has never used smokeless tobacco. He reports that he does not drink alcohol or use illicit drugs. He lives alone he is independent with ADLs he is not driving but he makes fixes his own meals and cleans his own house. Allergies  Allergen Reactions  . Sulfa Antibiotics Rash    Nausea     Family History  Problem Relation Age of Onset  . Heart disease Mother   . Early death Father   . Aneurysm Brother   . Aortic aneurysm Brother   . Diabetes Sister      Prior to Admission medications   Medication Sig Start Date End Date Taking? Authorizing Provider  acetaminophen (TYLENOL) 500 MG tablet Take 1,000 mg by mouth every 6 (six) hours as needed for mild pain, moderate pain or headache.  Yes Historical Provider, MD  amLODipine (NORVASC) 2.5 MG tablet Take 1 tablet (2.5 mg total) by mouth daily. 07/01/14  Yes Jodelle Gross, NP  colchicine (COLCRYS) 0.6 MG tablet TAKE ONE TABLET BY MOUTH 2 TIMES A DAY FOR GOUT. 12/24/13  Yes Merlyn Albert, MD  Cyanocobalamin (VITAMIN B-12) 2000 MCG TBCR Take 2,000 mcg by mouth daily.    Yes Historical Provider, MD  furosemide (LASIX) 40 MG tablet Take 1 and 1/2 tablets by  mouth every morning Patient taking differently: take 1 tablet by mouth every morning. 05/21/14  Yes Merlyn Albert, MD  latanoprost (XALATAN) 0.005 % ophthalmic solution Place 1 drop into both eyes at bedtime.  08/12/12  Yes Historical Provider, MD  metoprolol (LOPRESSOR) 100 MG tablet Take 1 tablet by mouth two  times daily 03/19/14  Yes Antoine Poche, MD  traMADol (ULTRAM) 50 MG tablet Take 50 mg by mouth daily as needed for moderate pain.   Yes Historical Provider, MD  warfarin (COUMADIN) 2.5 MG tablet Take 2.5 mg by mouth See admin  instructions. Per 3/7 visit, Hold coumadin 3/7 and 3/8. Take 1/2 on Wednesday 3/9,  then resume 1/2 tablet daily except none on Wednesdays   Yes Historical Provider, MD  zolpidem (AMBIEN) 10 MG tablet TAKE 1 TABLET AT BEDTIME AS NEEDED FOR SLEEP. 07/01/14  Yes Merlyn Albert, MD   Physical Exam: Filed Vitals:   07/03/14 1330 07/03/14 1400 07/03/14 1500 07/03/14 1530  BP: 100/65 100/68 102/66 99/68  Pulse:      Temp:      TempSrc:      Resp: 17 20 23 18   Weight:      SpO2:        Wt Readings from Last 3 Encounters:  07/03/14 86.183 kg (190 lb)  07/01/14 87.816 kg (193 lb 9.6 oz)  07/01/14 87.998 kg (194 lb)    General:  Appears calm and comfortable Eyes: PERRL, normal lids, irises & conjunctiva ENT: grossly normal hearing, lips & tongue very hard of hearing Neck: no LAD, masses or thyromegaly Cardiovascular: RRR, no m/r/g. No LE edema. Telemetry: SR, no arrhythmias  Respiratory: CTA bilaterally, no w/r/r. Normal respiratory effort. Hear no crackles Abdomen: soft, ntnd positive bowel sounds no guarding Skin: no rash or induration seen on limited exam Musculoskeletal: grossly normal tone BUE/BLE, arthritic changes to fingers. Psychiatric: grossly normal mood and affect, speech fluent and appropriate Neurologic: grossly non-focal. speech clear facial symmetry           Labs on Admission:  Basic Metabolic Panel:  Recent Labs Lab 07/03/14 1101  NA 141  K 5.1  CL 112  CO2 20  GLUCOSE 82  BUN 113*  CREATININE 5.32*  CALCIUM 8.7   Liver Function Tests:  Recent Labs Lab 07/03/14 1200  AST 19  ALT 14  ALKPHOS 58  BILITOT 1.0  PROT 6.5  ALBUMIN 3.7   No results for input(s): LIPASE, AMYLASE in the last 168 hours. No results for input(s): AMMONIA in the last 168 hours. CBC:  Recent Labs Lab 07/03/14 1101  WBC 5.1  NEUTROABS 3.8  HGB 11.6*  HCT 36.7*  MCV 96.3  PLT 81*   Cardiac Enzymes:  Recent Labs Lab 07/03/14 1200  TROPONINI <0.03    BNP  (last 3 results)  Recent Labs  07/03/14 1200  BNP 740.0*    ProBNP (last 3 results)  Recent Labs  04/09/14 1038  PROBNP 9656.0*    CBG: No results for input(s):  GLUCAP in the last 168 hours.  Radiological Exams on Admission: Dg Chest Portable 1 View  07/03/2014   CLINICAL DATA:  Fatigue since January, 2016.  EXAM: PORTABLE CHEST - 1 VIEW  COMPARISON:  PA and lateral chest 04/09/2014 and 06/06/2013.  FINDINGS: Small right effusion and basilar airspace disease have worsened since the most recent examination. No left pleural effusion is identified. There is cardiomegaly without edema.  IMPRESSION: Worsened right effusion and basilar airspace disease.   Electronically Signed   By: Drusilla Kanner M.D.   On: 07/03/2014 12:29    EKG: Independently reviewed.  Assessment/Plan Principal Problem:   Acute on chronic renal failure: Likely related to decreased by mouth intake in the setting of diuretics. He reports taking only 40 mg of Lasix daily. He recently saw cardiology and agreed to weigh himself daily but that was 1 day ago. He does not look volume overloaded on the contrary look slightly dry. I will very gently give him IV fluids. I will hold nephrotoxins. On its her urine output. If no improvement consider a renal ultrasound. Of note a status post stent on right urine is clear and golden.  Active Problems: Generalized weakness; may be multifactorial. #1 in the setting of generalized worsening of chronic medical issues. He currently lives alone and we discussed the possibility that he may be approaching the point where he needs more help. He is open to that idea. Will ask physical therapy to evaluate tomorrow. He denies any recent falls.    Thrombocytopenia: May be related to acute illness. No history of same. No signs of obvious bleeding. Will monitor    Atrial fibrillation, chronic: Rate controlled. Note indicates he was taken off dig due to renal failure. Home medications include  Norvasc and metoprolol. I'm going to hold both of these for now do to soft blood pressure    Chronic kidney disease, stage III (moderate): See #1    Congestive heart failure: Given #2 there are some concern for worsening CHF diastolic. Echo in 2009 showed EF of 50%. He is on a beta blocker and Lasix which were holding for now. Will get an updated echo and monitor. Obtain daily weights and measure intake and output.     Code Status: DNR DVT Prophylaxis: Family Communication: brother at bedside Disposition Plan: may need snf  Time spent: 65 minutes  [facility] Pager [phone]

## 2014-07-04 ENCOUNTER — Telehealth: Payer: Self-pay | Admitting: Family Medicine

## 2014-07-04 LAB — BASIC METABOLIC PANEL
Anion gap: 9 (ref 5–15)
BUN: 106 mg/dL — AB (ref 6–23)
CALCIUM: 8.5 mg/dL (ref 8.4–10.5)
CO2: 19 mmol/L (ref 19–32)
Chloride: 115 mmol/L — ABNORMAL HIGH (ref 96–112)
Creatinine, Ser: 4.7 mg/dL — ABNORMAL HIGH (ref 0.50–1.35)
GFR, EST AFRICAN AMERICAN: 12 mL/min — AB (ref >90)
GFR, EST NON AFRICAN AMERICAN: 10 mL/min — AB (ref >90)
Glucose, Bld: 78 mg/dL (ref 70–99)
Potassium: 4.6 mmol/L (ref 3.5–5.1)
Sodium: 143 mmol/L (ref 135–145)

## 2014-07-04 LAB — PHOSPHORUS: Phosphorus: 5 mg/dL — ABNORMAL HIGH (ref 2.3–4.6)

## 2014-07-04 LAB — CBC
HCT: 34.7 % — ABNORMAL LOW (ref 39.0–52.0)
Hemoglobin: 11 g/dL — ABNORMAL LOW (ref 13.0–17.0)
MCH: 30.2 pg (ref 26.0–34.0)
MCHC: 31.7 g/dL (ref 30.0–36.0)
MCV: 95.3 fL (ref 78.0–100.0)
Platelets: 73 10*3/uL — ABNORMAL LOW (ref 150–400)
RBC: 3.64 MIL/uL — AB (ref 4.22–5.81)
RDW: 17 % — ABNORMAL HIGH (ref 11.5–15.5)
WBC: 4.6 10*3/uL (ref 4.0–10.5)

## 2014-07-04 LAB — TROPONIN I: Troponin I: 0.03 ng/mL (ref <0.031)

## 2014-07-04 LAB — PROTIME-INR
INR: 2.86 — ABNORMAL HIGH (ref 0.00–1.49)
Prothrombin Time: 30.2 seconds — ABNORMAL HIGH (ref 11.6–15.2)

## 2014-07-04 MED ORDER — METOPROLOL TARTRATE 25 MG PO TABS
25.0000 mg | ORAL_TABLET | Freq: Two times a day (BID) | ORAL | Status: DC
  Administered 2014-07-04: 25 mg via ORAL
  Filled 2014-07-04: qty 1

## 2014-07-04 NOTE — Telephone Encounter (Signed)
Patient called yesterday and said that he spoke with the PA and was told to go to hospital.  Onalee Hua, his brother, wants to speak with the PA that he spoke with?  (He says PA, but I doubt Eber Jones handled this)

## 2014-07-04 NOTE — Care Management Note (Signed)
    Page 1 of 1   07/04/2014     1:49:25 PM CARE MANAGEMENT NOTE 07/04/2014  Patient:  [name], [name]   Account Number:  [id number]  Date Initiated:  07/04/2014  Documentation initiated by:  CHILDRESS,JESSICA  Subjective/Objective Assessment:   Pt is from home, lives alone, uses a cane but is idependnet with ADL's. Pt plans to dsicharge home with self care. PT has recommended a walker at discharge. Order has been placed and pt has choosen [facility] for DME.     Action/Plan:   Kara Mead, of [facility], has been notified of DME order and will deliver walker to pt room prior to discharge. No further CM needs identified.   Anticipated DC Date:  07/07/2014   Anticipated DC Plan:  HOME/SELF CARE      DC Planning Services  CM consult      [facility] Choice  HOME HEALTH   Choice offered to / List presented to:  C-1 Patient   DME arranged  Levan Hurst      DME agency  [facility]        Status of service:  Completed, signed off Medicare Important Message given?   (If response is "NO", the following Medicare IM given date fields will be blank) Date Medicare IM given:   Medicare IM given by:   Date Additional Medicare IM given:   Additional Medicare IM given by:    Discharge Disposition:  HOME/SELF CARE  Per UR Regulation:  Reviewed for med. necessity/level of care/duration of stay  If discussed at Long Length of Stay Meetings, dates discussed:    Comments:  07/04/2014 1330 Kathyrn Sheriff, RN, MSN, CM

## 2014-07-04 NOTE — Progress Notes (Signed)
Patient has spoke with RN and attending MD and has decided to sign to against medical advice. Risks and consequences of leaving AMA discussed with patient and patient verbalized understanding. AMA form filled out and given to patient to sign. Form placed in chart. Peripheral IV and heart monitor removed.

## 2014-07-04 NOTE — Evaluation (Signed)
Physical Therapy Evaluation Patient Details Name: [name] MRN: [id number] DOB: 1925-05-01 Today's Date: 07/04/2014   History of Present Illness  Patient presents with progressive fatigue/weakness. He's had worsening in his renal function. Appreciate nephrology input. Clinically, he appears to be volume overloaded as was indicated by his CT scan. Agree with checking echocardiogram. Will likely need a possible thoracentesis for pleural effusion. He has a functioning nephrostomy on the right side, but evidence of left-sided hydronephrosis and UPJ stone. Will need to discuss with urology in a.m., although recommendations may be nephrostomy. Will discontinue IV fluids were ordered on admission and start the patient on Lasix. We'll give a dose of vitamin K in case the patient needs procedures. Patient has declined any hemodialysis. Continue to follow  Clinical Impression  Pt strongly stating that he plans to leave hospital today.  He denies any illness and does not acknowledge any fatigue or weakness.  On evaluation, his transfers are independent.  He normally ambulates with a cane and gait assessment reveals decreased dynamic standing balance.  I am recommending that he use a walker for gait and he is independent in such.  He would like to have a Rollator.  He is not interested in any follow up PT as he is totally focused on leaving the hospital.    Follow Up Recommendations No PT follow up    Equipment Recommendations  Rolling walker with 5" wheels (Rollator)    Recommendations for Other Services  none     Precautions / Restrictions Precautions Precautions: Fall Restrictions Weight Bearing Restrictions: No      Mobility  Bed Mobility Overal bed mobility: Modified Independent                Transfers Overall transfer level: Modified independent                  Ambulation/Gait Ambulation/Gait assistance: Min guard Ambulation Distance (Feet): 150 Feet (x 2) Assistive  device: Straight cane;Rolling walker (2 wheeled) Gait Pattern/deviations: WFL(Within Functional Limits)   Gait velocity interpretation: at or above normal speed for age/gender General Gait Details: when pt uses a cane he tends to reach for railings/furniture with opposite hand and sometimes loses his balance to the right...he stumbled over his feet twice.Marland KitchenMarland KitchenI am strongly recommending that he use a walker for gait  Stairs            Wheelchair Mobility    Modified Rankin (Stroke Patients Only)       Balance Overall balance assessment: Needs assistance Sitting-balance support: No upper extremity supported;Feet supported Sitting balance-Leahy Scale: Normal     Standing balance support: Single extremity supported;During functional activity Standing balance-Leahy Scale: Fair                               Pertinent Vitals/Pain Pain Assessment: No/denies pain    Home Living Family/patient expects to be discharged to:: Private residence Living Arrangements: Alone Available Help at Discharge: Available PRN/intermittently Type of Home: House Home Access: Stairs to enter Entrance Stairs-Rails: Right Entrance Stairs-Number of Steps: 3 Home Layout: One level Home Equipment: Cane - single point      Prior Function Level of Independence: Independent with assistive device(s)         Comments: uses a cane     Hand Dominance   Dominant Hand: Right    Extremity/Trunk Assessment  Lower Extremity Assessment: Overall WFL for tasks assessed      Cervical / Trunk Assessment: Kyphotic  Communication   Communication: No difficulties  Cognition Arousal/Alertness: Awake/alert Behavior During Therapy: WFL for tasks assessed/performed Overall Cognitive Status: Within Functional Limits for tasks assessed                      General Comments      Exercises        Assessment/Plan    PT Assessment Patent does not need any  further PT services (balance is normalized with a walker...pt declines any therapy)  PT Diagnosis     PT Problem List    PT Treatment Interventions     PT Goals (Current goals can be found in the Care Plan section) Acute Rehab PT Goals PT Goal Formulation: All assessment and education complete, DC therapy    Frequency     Barriers to discharge        Co-evaluation               End of Session Equipment Utilized During Treatment: Gait belt Activity Tolerance: Patient tolerated treatment well Patient left: in bed;with call bell/phone within reach;with bed alarm set           Time: 2336-1224 PT Time Calculation (min) (ACUTE ONLY): 34 min   Charges:   PT Evaluation $Initial PT Evaluation Tier I: 1 Procedure     PT G CodesManson Passey, Jozette Castrellon L 07/04/2014, 10:01 AM

## 2014-07-04 NOTE — Progress Notes (Signed)
Patient's heart rate is in 155-120s sustained. Patient asymptomatic at this time. Dr. Kerry Hough notified. Will continue to monitor patient.

## 2014-07-04 NOTE — Care Management Utilization Note (Signed)
UR completed 

## 2014-07-04 NOTE — Progress Notes (Signed)
*  PRELIMINARY RESULTS* Echocardiogram 2D Echocardiogram has been performed.  Patient doesn't want test until he talks to doctor. Said he doesn't need the test, relayed this to the RN.  Jeryl Columbia 07/04/2014, 10:14 AM

## 2014-07-04 NOTE — Telephone Encounter (Signed)
Brother said patient is trying to leave hospital-his kidneys are not working and family is very concerned.

## 2014-07-04 NOTE — Telephone Encounter (Signed)
This is a bit strange, i saw day bef hospitaliztion, then the cardilogy p a saw, then presented to er the following day with worsening symptoms and admitted for progressive weakness. ntsw (dont forget hipaa rules)

## 2014-07-04 NOTE — Discharge Summary (Signed)
Physician Discharge Summary  [name] ZOX:[id number] DOB: 1926-04-15 DOA: 07/03/2014  PCP: [name], MD  Admit date: 07/03/2014 Discharge date: 07/04/2014  Time spent: 15 minutes  PATIENT LEFT THE HOSPITAL AGAINST MEDICAL ADVICE  Discharge Diagnoses:  Principal Problem:   Acute on chronic renal failure Active Problems:   Atrial fibrillation, chronic   Chronic kidney disease, stage III (moderate)   Congestive heart failure   CHF (congestive heart failure)   Generalized weakness   Thrombocytopenia   Filed Weights   07/03/14 1035 07/04/14 0438  Weight: 86.183 kg (190 lb) 85.9 kg (189 lb 6 oz)    History of present illness:  This patient presented to the hospital with generalized weakness. He was found have acute on chronic renal failure and significant uremia. Clinically, he appears to be volume overloaded. He was admitted to the hospital for further treatments.  Hospital Course:  Patient was seen in consultation by nephrology. The possibility of dialysis was discussed and patient adamantly refused the possibility of starting dialysis. He was started on intravenous Lasix with some improvement in his renal function, although this has not returned to baseline. Imaging of the abdomen indicated that his right-sided nephrostomy was draining appropriately, but he did have left-sided hydronephrosis. I offered the patient a urology consult, but he refused and wanted to see his primary urologist, Dr. [name] in person. I offered to transfer him to [facility] for further urology evaluation, the patient declined and wished to return home today.  Since the patient was volume overloaded and did not have an up-to-date echocardiogram in our system, a repeat echocardiogram was ordered. Patient refused to have this done until he spoke to his urologist and person.  Patient has a history of atrial fibrillation. He was monitored on telemetry and noted to develop rapid atrial fibrillation.  Blood pressures were on the lower side and therefore lower doses of his beta blocker were initiated. He chronically takes 100 mg of metoprolol twice a day. He was restarted on 25 mg twice a day due to marginal blood pressures.   The day after admission, patient was very adamant about being discharged home. He repeatedly said "I never felt bad before coming to the hospital and I did not need to stay in the hospital". I explained his current condition regarding his tachycardia, renal failure and hydronephrosis and recommended continued inpatient care. Patient refused to stay in the hospital. I explained the risks of leaving AGAINST MEDICAL ADVICE including increased morbidity and the possibility of death. The patient very clearly acknowledged these risks and said "I understand if I leave that I may die, but I am 81 years old and I am okay with that".  Procedures:    Consultations:  Nephrology   Allergies  Allergen Reactions  . Sulfa Antibiotics Rash    Nausea       The results of significant diagnostics from this hospitalization (including imaging, microbiology, ancillary and laboratory) are listed below for reference.    Significant Diagnostic Studies: Ct Abdomen Pelvis Wo Contrast  07/03/2014   CLINICAL DATA:  Multiple change out sub nephrostomy tube. Renal failure. Renal calculi. Infrarenal aortic aneurysm.  EXAM: CT ABDOMEN AND PELVIS WITHOUT CONTRAST  TECHNIQUE: Multidetector CT imaging of the abdomen and pelvis was performed following the standard protocol without IV contrast.  COMPARISON:  06/13/2013  FINDINGS: Lower chest: Marked cardiomegaly with extensive coronary artery atherosclerosis. Aortic valve calcification.  Large right pleural effusion with passive atelectasis. Mild airway thickening in the left  lower lobe.  Hepatobiliary: Stable hypodense lesion in the dome of segment 2 of the liver, proximally 9 mm in diameter on image 16 series 2. Cholecystectomy.  Pancreas: Unremarkable   Spleen: Unremarkable  Adrenals/Urinary Tract: Thin, atrophic right renal parenchyma. Right nephrostomy tube in place coiled in the lower pole collecting system, with a small amount of gas in the collecting system but without overt hydronephrosis. Fluid stranding in the perirenal spaces and renal hilar spaces appears commensurate with the generalized mesenteric, omental, and subcutaneous edema indicating third spacing of fluid. A 3 mm right UPJ calculus is present, image 40 series 2. Vascular calcifications are present along the right renal hilum.  Scattered exophytic lesions of both kidneys of varying complexity are again observed common non having demonstrated worrisome increase in size since the exam from 1 year ago.  On the left, there is hydronephrosis and a flaccid and distended renal collecting system along with several proximal ureteral calculi including a 3 mm calculus on image 49 of series 2 and a 4 mm calculus on image 52 of series 2. The ureter is difficult to follow due to the surrounding fluid. Urinary bladder unremarkable.  Stomach/Bowel: Descending and sigmoid colon diverticulosis.  Vascular/Lymphatic: Extensive atherosclerotic calcification.  Fusiform infrarenal abdominal aortic aneurysm, 5.6 cm anterior - posterior by 5.4 cm transverse (formerly 5.2 by 5.4 cm). The aneurysm extends into the common iliac vessels which are both dilated.  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  Reproductive: Unremarkable  Other: Diffuse subcutaneous, mesenteric and, and omental edema compatible with third spacing of fluid. Large right pleural effusion and scattered ascites along with presacral edema and bilateral hydroceles extending into the fat containing inguinal hernias. The right inguinal hernia contains a loop of small bowel without findings of strangulation or  obstruction. There is subcutaneous edema along the pubis.  Musculoskeletal: Bridging spurring of both sacroiliac joints.  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 There is grade 1 retrolisthesis at L3-4 and grade 1 anterolisthesis at L4-5.  Healing left ninth and tenth distal rib fractures noted.  IMPRESSION: 1. No right hydronephrosis. Abnormal atrophic thinning of both kidneys with scattered complex cysts of both kidneys. The right nephrostomy tube coils in the right kidney lower pole collecting system. 2. Left hydronephrosis with dilated/flaccid collecting system and several proximal ureteral calculi. There is also a small right kidney UPJ stone. 3. Extensive third spacing of fluid including ascites, large right pleural effusion, and subcutaneous, mesenteric, and omental edema. 4. Mild enlargement of the infrarenal abdominal aortic aneurysm. 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 5. Descending and sigmoid colon diverticulosis without active diverticulitis. 6. Severe lumbar spondylosis and degenerative disc disease with multilevel impingement. 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 7. The right inguinal hernia contains a loop of small bowel without strangulation or obstruction. 8. Healing left ninth and tenth rib fractures.   Electronically Signed   By: Gaylyn Rong M.D.   On: 07/03/2014 18:34   Dg Chest Portable 1 View  07/03/2014   CLINICAL DATA:  Fatigue since January, 2016.  EXAM: PORTABLE CHEST - 1 VIEW  COMPARISON:  PA and lateral chest 04/09/2014 and 06/06/2013.  FINDINGS:  Small right effusion and basilar airspace disease have worsened since the most recent examination. No left pleural effusion is identified. There is cardiomegaly without edema.  IMPRESSION:  Worsened right effusion and basilar airspace disease.   Electronically Signed   By: Drusilla Kanner M.D.   On: 07/03/2014 12:29    Microbiology: No results found for this or any previous visit (from the past 240 hour(s)).   Labs: Basic Metabolic Panel:  Recent Labs Lab 07/03/14 1101 07/04/14 0542  NA 141 143  K 5.1 4.6  CL 112 115*  CO2 20 19  GLUCOSE 82 78  BUN 113* 106*  CREATININE 5.32* 4.70*  CALCIUM 8.7 8.5  PHOS  --  5.0*   Liver Function Tests:  Recent Labs Lab 07/03/14 1200  AST 19  ALT 14  ALKPHOS 58  BILITOT 1.0  PROT 6.5  ALBUMIN 3.7   No results for input(s): LIPASE, AMYLASE in the last 168 hours. No results for input(s): AMMONIA in the last 168 hours. CBC:  Recent Labs Lab 07/03/14 1101 07/04/14 0542  WBC 5.1 4.6  NEUTROABS 3.8  --   HGB 11.6* 11.0*  HCT 36.7* 34.7*  MCV 96.3 95.3  PLT 81* 73*   Cardiac Enzymes:  Recent Labs Lab 07/03/14 1200 07/03/14 2012 07/04/14 0542  CKTOTAL 126  --   --   TROPONINI <0.03 <0.03 0.03   BNP: BNP (last 3 results)  Recent Labs  07/03/14 1200  BNP 740.0*    ProBNP (last 3 results)  Recent Labs  04/09/14 1038  PROBNP 9656.0*    CBG: No results for input(s): GLUCAP in the last 168 hours.     Signed:  MEMON,JEHANZEB  [facility] 07/04/2014, 8:04 PM

## 2014-07-04 NOTE — Progress Notes (Signed)
Subjective: Interval History: has no complaint of nausea or vomiting. He said he's feeling better. But patient states that he does want any dialysis if his kidneys doesn't get better. Patient denies any difficulty breathing..  Objective: Vital signs in last 24 hours: Temp:  [97.8 F (36.6 C)-98.4 F (36.9 C)] 98.4 F (36.9 C) (03/10 0438) Pulse Rate:  [60-105] 105 (03/10 0438) Resp:  [17-23] 18 (03/10 0438) BP: (96-108)/(60-68) 108/64 mmHg (03/10 0438) SpO2:  [91 %-99 %] 99 % (03/10 0438) Weight:  [85.9 kg (189 lb 6 oz)-86.183 kg (190 lb)] 85.9 kg (189 lb 6 oz) (03/10 0438) Weight change:   Intake/Output from previous day: 03/09 0701 - 03/10 0700 In: -  Out: 575 [Urine:575] Intake/Output this shift:    General appearance: alert, cooperative and no distress Resp: diminished breath sounds bilaterally Cardio: regular rate and rhythm, S1, S2 normal, no murmur, click, rub or gallop GI: soft, non-tender; bowel sounds normal; no masses,  no organomegaly Extremities: extremities normal, atraumatic, no cyanosis or edema  Lab Results:  Recent Labs  07/03/14 1101 07/04/14 0542  WBC 5.1 4.6  HGB 11.6* 11.0*  HCT 36.7* 34.7*  PLT 81* 73*   BMET:  Recent Labs  07/03/14 1101 07/04/14 0542  NA 141 143  K 5.1 4.6  CL 112 115*  CO2 20 19  GLUCOSE 82 78  BUN 113* 106*  CREATININE 5.32* 4.70*  CALCIUM 8.7 8.5   No results for input(s): PTH in the last 72 hours. Iron Studies: No results for input(s): IRON, TIBC, TRANSFERRIN, FERRITIN in the last 72 hours.  Studies/Results: Ct Abdomen Pelvis Wo Contrast  07/03/2014   CLINICAL DATA:  Multiple change out sub nephrostomy tube. Renal failure. Renal calculi. Infrarenal aortic aneurysm.  EXAM: CT ABDOMEN AND PELVIS WITHOUT CONTRAST  TECHNIQUE: Multidetector CT imaging of the abdomen and pelvis was performed following the standard protocol without IV contrast.  COMPARISON:  06/13/2013  FINDINGS: Lower chest: Marked cardiomegaly with  extensive coronary artery atherosclerosis. Aortic valve calcification.  Large right pleural effusion with passive atelectasis. Mild airway thickening in the left lower lobe.  Hepatobiliary: Stable hypodense lesion in the dome of segment 2 of the liver, proximally 9 mm in diameter on image 16 series 2. Cholecystectomy.  Pancreas: Unremarkable  Spleen: Unremarkable  Adrenals/Urinary Tract: Thin, atrophic right renal parenchyma. Right nephrostomy tube in place coiled in the lower pole collecting system, with a small amount of gas in the collecting system but without overt hydronephrosis. Fluid stranding in the perirenal spaces and renal hilar spaces appears commensurate with the generalized mesenteric, omental, and subcutaneous edema indicating third spacing of fluid. A 3 mm right UPJ calculus is present, image 40 series 2. Vascular calcifications are present along the right renal hilum.  Scattered exophytic lesions of both kidneys of varying complexity are again observed common non having demonstrated worrisome increase in size since the exam from 1 year ago.  On the left, there is hydronephrosis and a flaccid and distended renal collecting system along with several proximal ureteral calculi including a 3 mm calculus on image 49 of series 2 and a 4 mm calculus on image 52 of series 2. The ureter is difficult to follow due to the surrounding fluid. Urinary bladder unremarkable.  Stomach/Bowel: Descending and sigmoid colon diverticulosis.  Vascular/Lymphatic: Extensive atherosclerotic calcification.  Fusiform infrarenal abdominal aortic aneurysm, 5.6 cm anterior - posterior by 5.4 cm transverse (formerly 5.2 by 5.4 cm). The aneurysm extends into the common iliac vessels which are both  dilated.  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  Reproductive: Unremarkable  Other:  Diffuse subcutaneous, mesenteric and, and omental edema compatible with third spacing of fluid. Large right pleural effusion and scattered ascites along with presacral edema and bilateral hydroceles extending into the fat containing inguinal hernias. The right inguinal hernia contains a loop of small bowel without findings of strangulation or obstruction. There is subcutaneous edema along the pubis.  Musculoskeletal: Bridging spurring of both sacroiliac joints.  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 There is grade 1 retrolisthesis at L3-4 and grade 1 anterolisthesis at L4-5.  Healing left ninth and tenth distal rib fractures noted.  IMPRESSION: 1. No right hydronephrosis. Abnormal atrophic thinning of both kidneys with scattered complex cysts of both kidneys. The right nephrostomy tube coils in the right kidney lower pole collecting system. 2. Left hydronephrosis with dilated/flaccid collecting system and several proximal ureteral calculi. There is also a small right kidney UPJ stone. 3. Extensive third spacing of fluid including ascites, large right pleural effusion, and subcutaneous, mesenteric, and omental edema. 4. Mild enlargement of the infrarenal abdominal aortic aneurysm. 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 5. Descending and sigmoid colon diverticulosis without active diverticulitis. 6. Severe lumbar spondylosis and degenerative disc disease with multilevel impingement. 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 7. The right inguinal hernia contains a loop of small bowel without strangulation or obstruction. 8. Healing left ninth and tenth rib fractures.   Electronically Signed   By: Gaylyn Rong M.D.   On: 07/03/2014 18:34   Dg Chest Portable 1 View  07/03/2014    CLINICAL DATA:  Fatigue since January, 2016.  EXAM: PORTABLE CHEST - 1 VIEW  COMPARISON:  PA and lateral chest 04/09/2014 and 06/06/2013.  FINDINGS: Small right effusion and basilar airspace disease have worsened since the most recent examination. No left pleural effusion is identified. There is cardiomegaly without edema.  IMPRESSION: Worsened right effusion and basilar airspace disease.   Electronically Signed   By: Drusilla Kanner M.D.   On: 07/03/2014 12:29    I have reviewed the patient's current medications.  Assessment/Plan: Problem #1 acute kidney injury superimposed on chronic: Possibly obstructive uropathy. Presently is pending creatinine seems to be improving and patient does not have any nausea or vomiting. Patient with right-sided nephrostomy tube but presently seems to have obstructive uropathy on the left side. Problem #2 chronic renal failure: Thought to be secondary to focal segmental nephrosclerosis/obstructive uropathy/hypertension. Stage IV Problem #3 patient with pleural effusion: Etiology as this moment no clear Problem #4 history of CHF: Patient presently asymptomatic Problem #5 history of atrial fibrillation: His heart rate is fluctuating and occasionally high Problem #6 history of hypertension: His blood pressure is reasonably controlled Problem #7 history of abdominal and thoracic aortic aneurysm  Problem #8 anemia Plan: At this moment patient may benefit from urology evaluation and management of his left hydronephrosis. Patient also will like to go back and see his urologist. Presently patient doesn't need dialysis. We'll continue his Lasix and will check his basic metabolic panel in the morning.   LOS: 1 day   Silvia Hightower S 07/04/2014,9:49 AM

## 2014-07-04 NOTE — Evaluation (Signed)
Clinical/Bedside Swallow Evaluation Patient Details  Name: [name] MRN: [id number] Date of Birth: May 21, 1925  Today's Date: 07/04/2014 Time: SLP Start Time (ACUTE ONLY): 1210 SLP Stop Time (ACUTE ONLY): 1314 SLP Time Calculation (min) (ACUTE ONLY): 64 min  Past Medical History:  Past Medical History  Diagnosis Date  . Hyperlipidemia   . White coat hypertension   . Cardiomyopathy      EF:30-40% in 06/1998, but 50-55% in 2009;  HX ISCHEMIC CARDIOMYOPATHY  . Chronic atrial fibrillation   . Vitamin B12 deficiency   . Chronic anticoagulation   . Arthritis   . Sensory neuropathy   . Chronic kidney disease, stage III (moderate)     creatinine 1.5 2001, 2.0 2009, 2.28 in 12/2009  . Mild renal insufficiency   . Hydronephrosis, bilateral   . Ureteral calculi     BILATERAL  . History of CHF (congestive heart failure)     1999  &  2005  . History of kidney stones   . Bilateral renal cysts   . 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 Chronic venous insufficiency   . PAD (peripheral artery disease)   . Edema of both legs   . History of gout     per pt stable  . Glaucoma of both eyes   . Wears hearing aid     bilateral   Past Surgical History:  Past Surgical History  Procedure Laterality Date  . Repair thoracic aorta  01/1995    s/p rupture  . Cataract extraction w/ intraocular lens  implant, bilateral  2012  . 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 Laparoscopic cholecystectomy  JAN 2005  . Percutaneous nephrostolithotomy  1970's  . Abdominal aortagram  07-16-2003  DR ROTHBART    W/ COIL EMBOLIZATION OF SIX BRANCHES OF RIGHT INTERNAL RENAL ARTERY ANEURYSM  . Cardiovascular stress test  04-25-2003    LOW RISK CARDIOLITE STUDY/ MODERATE LV DILATATION AND IMPAIRMENT OF LVSF/  NO ISCHEMIA  . Transthoracic  echocardiogram  02-07-2008  DR ROTHBART    MODERATE DILATED LV/  EF 50-55%/ MILD AR/ MILD TO MODERATE AORTIC ROOT DILATATION/ MODERATE MR/ MODERATE LA   &  RA   DIILATED/ MILD TO MODERATE TR  . Cystoscopy w/ ureteral stent placement Bilateral 10/24/2012    Procedure: CYSTOSCOPY WITH RETROGRADE PYELOGRAM/URETERAL STENT PLACEMENT;  Surgeon: Lindaann Slough, MD;  Location: [facility] [place];  Service: Urology;  Laterality: Bilateral;  . Cystoscopy with ureteroscopy Left 11/27/2012    Procedure: CYSTOSCOPY,LEFT URETEROSCOPY WITH LASER LITHOTRIPSY/REMOVAL OF MIGRATED STENT, PLACEMENT OF LEFT STENT;  Surgeon: Garnett Farm, MD;  Location: [facility];  Service: Urology;  Laterality: Left;  . Holmium laser application Left 11/27/2012    Procedure: HOLMIUM LASER APPLICATION;  Surgeon: Garnett Farm, MD;  Location: [facility];  Service: Urology;  Laterality: Left;  . Cystoscopy with ureteroscopy and stent placement N/A 12/29/2012    Procedure: CYSTOSCOPY WITH URETEROSCOPY AND STENT PLACEMENT, removal of right and left stents, retrogrades, right stent exchange;  Surgeon: Garnett Farm, MD;  Location: [facility];  Service: Urology;  Laterality: N/A;  . Holmium laser application Right 12/29/2012    Procedure: HOLMIUM LASER APPLICATION;  Surgeon: Garnett Farm, MD;  Location: [facility];  Service: Urology;  Laterality: Right;  HLL OF (RT)  URETERAL PELVIC JUNCTION STONE  . Cystoscopy with retrograde pyelogram, ureteroscopy and stent placement Right 01/29/2013    Procedure: CYSTOSCOPY WITH RIGHT URETEROSCOPY AND STENT PLACEMENT;  Surgeon: Garnett Farm, MD;  Location: [facility];  Service: Urology;  Laterality: Right;  . Holmium laser application N/A 01/29/2013    Procedure: HOLMIUM LASER APPLICATION;  Surgeon: Garnett Farm, MD;  Location: [facility];  Service: Urology;  Laterality: N/A;  . Esophagogastroduodenoscopy N/A 02/16/2013    Procedure: ESOPHAGOGASTRODUODENOSCOPY (EGD);  Surgeon: Meryl Dare, MD;  Location: [facility];   Service: Endoscopy;  Laterality: N/A;  . Cystoscopy with retrograde pyelogram, ureteroscopy and stent placement Right 05/07/2013    Procedure: CYSTOSCOPY WITH RIGHT RETROGRADE PYELOGRAM, Balloon dilation of right ureter, Laser incision of right ureter, Nephrostogram;  Surgeon: Garnett Farm, MD;  Location: [facility];  Service: Urology;  Laterality: Right;  . Holmium laser application Right 05/07/2013    Procedure: HOLMIUM LASER APPLICATION;  Surgeon: Garnett Farm, MD;  Location: [facility];  Service: Urology;  Laterality: Right;   HPI:  [name] is a very pleasant 81 y.o. male with a past medical history of A. fib on Coumadin easily taken off dig due to renal failure, , hypertension, Rondec kidney failure stage III, kidney stones status post stent on right presents to the emergency department with chief complaint of generalized weakness. Initial evaluation in the emergency department reveals worsening kidney failure with some dehydration. Reports over the last 6 weeks he has experienced intermittent weakness. He states that yesterday morning he was so weak he was unable to get out of bed and he slid to the floor and lay there until 5:00 in the evening he was unable to get up. He denies loss of consciousness or injury. He reports his appetite is waxed and waned but he feels like he's been taken in fluids adequately. He denies fever chills abdominal pain nausea vomiting diarrhea constipation. He denies dysuria hematuria frequency or urgency. Denies shortness of breath headache dizziness syncope or near-syncope. He has seen his primary care provider (Dr. Gerda Diss) and cardiology nurse practitioner this week results of their evaluations are unremarkable. Chest x-ray reveals worsened right effusion and basilar airspace disease. Clinically, he appears to be volume overloaded as was indicated by his CT scan. Will likely need a possible thoracentesis for pleural effusion. He has a functioning nephrostomy on the right  side, but evidence of left-sided hydronephrosis and UPJ stone. Will discontinue IV fluids were ordered on admission and start the patient on Lasix. We'll give a dose of vitamin K in case the patient needs procedures. Patient has declined any hemodialysis. SLP asked to evaluate swallow due to concerns for aspiration.    Assessment / Plan / Recommendation Clinical Impression  [name] was seen at bedside for clinical swallow evaluation. Pt alert and cooperative, but immediately states that he doesn't "have any problems swallowing" and that he knows "what the problem is". He states that he is having trouble with his right ear- "feels like a wind tunnel blowing through it" that disappears when he is laying flat. He has not yet seen ENT for this problem. [name] went on to tell me that he just wanted to talk to his doctor, Dr. [name] in [place] about his "kidney problem". He stated that his family "made" him come to the hospital "because they don't think I should be living alone". He stated that he has come to terms with death and he would be fine "if  the Shaune Pollack took" him in the night. He does not want to go to an assisted living facility. He was seen during lunch meal and did demonstrate increased wet vocal quality and some coughing over the course of his meal. Pt denies the same, but quietly listened while I reviewed strategies with him and provided education about aspiration risks. He was encouraged to swallow "hard", swallow 2x for each bite/sip, and clear throat periodically. Pt reports that he would never want his diet altered should it be recommended. I expressed my understanding and pt appeared grateful. He expressed that he was planning on going home today so that he could "shower and get cleaned up" and that he would be calling Dr. [name] tomorrow. In fact, by the time this note was written, pt left AMA.     Aspiration Risk  Mild    Diet Recommendation Dysphagia 3 (Mechanical Soft);Thin liquid    Liquid Administration via: Cup;Straw Medication Administration: Whole meds with liquid Supervision: Patient able to self feed Compensations: Slow rate;Small sips/bites;Multiple dry swallows after each bite/sip;Clear throat intermittently Postural Changes and/or Swallow Maneuvers: Out of bed for meals;Seated upright 90 degrees;Upright 30-60 min after meal    Other  Recommendations Oral Care Recommendations: Oral care BID Other Recommendations: Clarify dietary restrictions   Follow Up Recommendations  None    Frequency and Duration        Pertinent Vitals/Pain VSS     Swallow Study Prior Functional Status   Lives at home with his golden retriever, neighbors come to check on him    General HPI: [name] is a very pleasant 81 y.o. male with a past medical history of A. fib on Coumadin easily taken off dig due to renal failure, , hypertension, Rondec kidney failure stage III, kidney stones status post stent on right presents to the emergency department with chief complaint of generalized weakness. Initial evaluation in the emergency department reveals worsening kidney failure with some dehydration. Reports over the last 6 weeks he has experienced intermittent weakness. He states that yesterday morning he was so weak he was unable to get out of bed and he slid to the floor and lay there until 5:00 in the evening he was unable to get up. He denies loss of consciousness or injury. He reports his appetite is waxed and waned but he feels like he's been taken in fluids adequately. He denies fever chills abdominal pain nausea vomiting diarrhea constipation. He denies dysuria hematuria frequency or urgency. Denies shortness of breath headache dizziness syncope or near-syncope. He has seen his primary care provider (Dr. Gerda Diss) and cardiology nurse practitioner this week results of their evaluations are unremarkable. Chest x-ray reveals worsened right effusion and basilar airspace disease.  Clinically, he appears to be volume overloaded as was indicated by his CT scan. Will likely need a possible thoracentesis for pleural effusion. He has a functioning nephrostomy on the right side, but evidence of left-sided hydronephrosis and UPJ stone. Will discontinue IV fluids were ordered on admission and start the patient on Lasix. We'll give a dose of vitamin K in case the patient needs procedures. Patient has declined any hemodialysis. SLP asked to evaluate swallow due to concerns for aspiration.  Type of Study: Bedside swallow evaluation Previous Swallow Assessment: none on record Diet Prior to this Study: Regular;Thin liquids (Heart healthy) Temperature Spikes Noted: No Respiratory Status: Nasal cannula History of Recent Intubation: No Behavior/Cognition: Alert;Cooperative;Pleasant mood Oral Cavity - Dentition: Adequate natural dentition Self-Feeding Abilities: Able to feed self  Patient Positioning: Upright in bed Baseline Vocal Quality: Clear;Wet (mild wet quality) Volitional Cough: Congested;Weak Volitional Swallow: Able to elicit    Oral/Motor/Sensory Function Overall Oral Motor/Sensory Function: Appears within functional limits for tasks assessed   Ice Chips Ice chips: Not tested   Thin Liquid Thin Liquid: Impaired Presentation: Self Fed;Straw Pharyngeal  Phase Impairments: Throat Clearing - Delayed    Nectar Thick Nectar Thick Liquid: Not tested   Honey Thick Honey Thick Liquid: Not tested   Puree Puree: Within functional limits Presentation: Spoon;Self Fed   Solid      Thank you,  Havery Moros, CCC-SLP [phone]   Solid: Within functional limits Presentation: Self Fed Other Comments:  (some difficulty masticating meat)       PORTER,DABNEY 07/04/2014,4:57 PM

## 2014-07-05 ENCOUNTER — Telehealth: Payer: Self-pay | Admitting: *Deleted

## 2014-07-05 NOTE — Telephone Encounter (Signed)
ok 

## 2014-07-05 NOTE — Telephone Encounter (Signed)
FYI: Patient seen Urologist today and has a kidney stone- they are contacting Cardiology about his coumadin to devise treatment plan

## 2014-07-05 NOTE — Telephone Encounter (Signed)
[facility] (mailbox is full)

## 2014-07-05 NOTE — Telephone Encounter (Addendum)
Spoke with patient who states he can't come in to see Dr Brett Canales next week- he will be very busy with Urology but will come in when things settle back down.

## 2014-07-05 NOTE — Telephone Encounter (Signed)
i tried to call pt at hme, he left the hosp last night, adamantly agaist dialysius and that iscompletely his right to choose that. Rec contacting pt and rec f u visit in office next wk

## 2014-07-05 NOTE — Telephone Encounter (Signed)
Surgical clearance form faxed over 07/05/14. Put in Dr Branches folder.tmj

## 2014-07-08 NOTE — Telephone Encounter (Signed)
Will forward note to Dr Wyline Mood

## 2014-07-09 ENCOUNTER — Telehealth: Payer: Self-pay | Admitting: *Deleted

## 2014-07-09 NOTE — Telephone Encounter (Signed)
Pt calling concerned about surgical clearance not being done yet. He only has one kidney and other is functioning on 10%. Please get with Dr Wyline Mood today and back with patient as soon as possible he is worried//tmj

## 2014-07-09 NOTE — Telephone Encounter (Signed)
Will forward to Dr. Branch. 

## 2014-07-10 ENCOUNTER — Other Ambulatory Visit: Payer: Self-pay | Admitting: Urology

## 2014-07-10 NOTE — Progress Notes (Signed)
Called and requested release pf orders to Epic sign and held surgery 07-15-14 pre op 07-12-14 thanks

## 2014-07-11 NOTE — Patient Instructions (Addendum)
[name]  07/11/2014   Your procedure is scheduled on: 07/15/2014    Report to [facility] Main  Entrance and follow signs to               Short Stay Center at     0945 AM.  Call this number if you have problems the morning of surgery [phone]   Remember:  Do not eat food or drink liquids :After Midnight.     Take these medicines the morning of surgery with A SIP OF WATER: , Colchicine, Metoprolol ( Lopressor )                                You may not have any metal on your body including hair pins and              piercings  Do not wear jewelry, , lotions, powders or perfumes., deodorant.                           Men may shave face and neck.   Do not bring valuables to the hospital. [place] IS NOT             RESPONSIBLE   FOR VALUABLES.  Contacts, dentures or bridgework may not be worn into surgery.      Patients discharged the day of surgery will not be allowed to drive home.  Name and phone number of your driver:  Special Instructions: coughing and deep breathing exericses, leg exercises               Please read over the following fact sheets you were given: _____________________________________________________________________             [facility] - Preparing for Surgery Before surgery, you can play an important role.  Because skin is not sterile, your skin needs to be as free of germs as possible.  You can reduce the number of germs on your skin by washing with CHG (chlorahexidine gluconate) soap before surgery.  CHG is an antiseptic cleaner which kills germs and bonds with the skin to continue killing germs even after washing. Please DO NOT use if you have an allergy to CHG or antibacterial soaps.  If your skin becomes reddened/irritated stop using the CHG and inform your nurse when you arrive at Short Stay. Do not shave (including legs and underarms) for at least 48 hours prior to the first CHG shower.  You may shave your  face/neck. Please follow these instructions carefully:  1.  Shower with CHG Soap the night before surgery and the  morning of Surgery.  2.  If you choose to wash your hair, wash your hair first as usual with your  normal  shampoo.  3.  After you shampoo, rinse your hair and body thoroughly to remove the  shampoo.                           4.  Use CHG as you would any other liquid soap.  You can apply chg directly  to the skin and wash                       Gently with a scrungie or clean washcloth.  5.  Apply the CHG Soap to your body ONLY FROM THE NECK DOWN.   Do not use on face/ open                           Wound or open sores. Avoid contact with eyes, ears mouth and genitals (private parts).                       Wash face,  Genitals (private parts) with your normal soap.             6.  Wash thoroughly, paying special attention to the area where your surgery  will be performed.  7.  Thoroughly rinse your body with warm water from the neck down.  8.  DO NOT shower/wash with your normal soap after using and rinsing off  the CHG Soap.                9.  Pat yourself dry with a clean towel.            10.  Wear clean pajamas.            11.  Place clean sheets on your bed the night of your first shower and do not  sleep with pets. Day of Surgery : Do not apply any lotions/deodorants the morning of surgery.  Please wear clean clothes to the hospital/surgery center.  FAILURE TO FOLLOW THESE INSTRUCTIONS MAY RESULT IN THE CANCELLATION OF YOUR SURGERY PATIENT SIGNATURE_________________________________  NURSE SIGNATURE__________________________________  ________________________________________________________________________

## 2014-07-12 ENCOUNTER — Ambulatory Visit (HOSPITAL_COMMUNITY)
Admission: RE | Admit: 2014-07-12 | Discharge: 2014-07-12 | Disposition: A | Discharge status: Home / Self Care | Payer: Medicare Other | Source: Ambulatory Visit | Attending: Anesthesiology | Admitting: Anesthesiology

## 2014-07-12 ENCOUNTER — Encounter (HOSPITAL_COMMUNITY): Payer: Self-pay

## 2014-07-12 ENCOUNTER — Telehealth: Payer: Self-pay | Admitting: *Deleted

## 2014-07-12 ENCOUNTER — Inpatient Hospital Stay (HOSPITAL_COMMUNITY): Admission: AD | Admit: 2014-07-12 | Payer: Medicare Other | Source: Ambulatory Visit | Admitting: Internal Medicine

## 2014-07-12 ENCOUNTER — Encounter (HOSPITAL_COMMUNITY)
Admission: RE | Admit: 2014-07-12 | Discharge: 2014-07-12 | Disposition: A | Discharge status: Home / Self Care | Payer: Medicare Other | Source: Ambulatory Visit | Attending: Urology | Admitting: Urology

## 2014-07-12 ENCOUNTER — Inpatient Hospital Stay (HOSPITAL_COMMUNITY)
Admission: EM | Admit: 2014-07-12 | Discharge: 2014-07-16 | DRG: 669 | Disposition: A | Discharge status: Home / Self Care | Payer: Medicare Other | Attending: Internal Medicine | Admitting: Internal Medicine

## 2014-07-12 ENCOUNTER — Inpatient Hospital Stay (HOSPITAL_COMMUNITY): Payer: Medicare Other

## 2014-07-12 DIAGNOSIS — N179 Acute kidney failure, unspecified: Secondary | ICD-10-CM | POA: Diagnosis present

## 2014-07-12 DIAGNOSIS — I482 Chronic atrial fibrillation, unspecified: Secondary | ICD-10-CM | POA: Diagnosis present

## 2014-07-12 DIAGNOSIS — I129 Hypertensive chronic kidney disease with stage 1 through stage 4 chronic kidney disease, or unspecified chronic kidney disease: Secondary | ICD-10-CM | POA: Diagnosis present

## 2014-07-12 DIAGNOSIS — N1330 Unspecified hydronephrosis: Secondary | ICD-10-CM | POA: Diagnosis present

## 2014-07-12 DIAGNOSIS — N200 Calculus of kidney: Secondary | ICD-10-CM

## 2014-07-12 DIAGNOSIS — N183 Chronic kidney disease, stage 3 (moderate): Secondary | ICD-10-CM | POA: Diagnosis present

## 2014-07-12 DIAGNOSIS — N390 Urinary tract infection, site not specified: Secondary | ICD-10-CM | POA: Diagnosis present

## 2014-07-12 DIAGNOSIS — N131 Hydronephrosis with ureteral stricture, not elsewhere classified: Secondary | ICD-10-CM | POA: Diagnosis not present

## 2014-07-12 DIAGNOSIS — I5022 Chronic systolic (congestive) heart failure: Secondary | ICD-10-CM | POA: Diagnosis present

## 2014-07-12 DIAGNOSIS — Z7901 Long term (current) use of anticoagulants: Secondary | ICD-10-CM | POA: Diagnosis not present

## 2014-07-12 DIAGNOSIS — D696 Thrombocytopenia, unspecified: Secondary | ICD-10-CM | POA: Diagnosis present

## 2014-07-12 DIAGNOSIS — N189 Chronic kidney disease, unspecified: Secondary | ICD-10-CM

## 2014-07-12 DIAGNOSIS — I509 Heart failure, unspecified: Secondary | ICD-10-CM

## 2014-07-12 DIAGNOSIS — N19 Unspecified kidney failure: Secondary | ICD-10-CM | POA: Insufficient documentation

## 2014-07-12 DIAGNOSIS — Z01810 Encounter for preprocedural cardiovascular examination: Secondary | ICD-10-CM

## 2014-07-12 DIAGNOSIS — N132 Hydronephrosis with renal and ureteral calculous obstruction: Principal | ICD-10-CM

## 2014-07-12 DIAGNOSIS — I10 Essential (primary) hypertension: Secondary | ICD-10-CM | POA: Diagnosis not present

## 2014-07-12 DIAGNOSIS — R531 Weakness: Secondary | ICD-10-CM | POA: Diagnosis present

## 2014-07-12 DIAGNOSIS — E875 Hyperkalemia: Secondary | ICD-10-CM | POA: Diagnosis present

## 2014-07-12 HISTORY — DX: Reserved for inherently not codable concepts without codable children: IMO0001

## 2014-07-12 LAB — BASIC METABOLIC PANEL
Anion gap: 9 (ref 5–15)
BUN: 100 mg/dL — AB (ref 6–23)
CO2: 21 mmol/L (ref 19–32)
Calcium: 8.5 mg/dL (ref 8.4–10.5)
Chloride: 112 mmol/L (ref 96–112)
Creatinine, Ser: 3.71 mg/dL — ABNORMAL HIGH (ref 0.50–1.35)
GFR calc Af Amer: 16 mL/min — ABNORMAL LOW (ref >90)
GFR, EST NON AFRICAN AMERICAN: 14 mL/min — AB (ref >90)
Glucose, Bld: 91 mg/dL (ref 70–99)
Potassium: 5.5 mmol/L — ABNORMAL HIGH (ref 3.5–5.1)
SODIUM: 142 mmol/L (ref 135–145)

## 2014-07-12 LAB — CBC
HCT: 36.1 % — ABNORMAL LOW (ref 39.0–52.0)
HEMOGLOBIN: 11.4 g/dL — AB (ref 13.0–17.0)
MCH: 31.3 pg (ref 26.0–34.0)
MCHC: 31.6 g/dL (ref 30.0–36.0)
MCV: 99.2 fL (ref 78.0–100.0)
Platelets: 83 10*3/uL — ABNORMAL LOW (ref 150–400)
RBC: 3.64 MIL/uL — ABNORMAL LOW (ref 4.22–5.81)
RDW: 18.2 % — AB (ref 11.5–15.5)
WBC: 5 10*3/uL (ref 4.0–10.5)

## 2014-07-12 LAB — PROTIME-INR
INR: 1.41 (ref 0.00–1.49)
Prothrombin Time: 17.3 seconds — ABNORMAL HIGH (ref 11.6–15.2)

## 2014-07-12 LAB — APTT: aPTT: 42 seconds — ABNORMAL HIGH (ref 24–37)

## 2014-07-12 MED ORDER — SODIUM CHLORIDE 0.9 % IJ SOLN
3.0000 mL | Freq: Two times a day (BID) | INTRAMUSCULAR | Status: DC
  Administered 2014-07-12 – 2014-07-15 (×2): 3 mL via INTRAVENOUS

## 2014-07-12 MED ORDER — MORPHINE SULFATE 2 MG/ML IJ SOLN: 1.0000 mg | INTRAMUSCULAR | Status: DC | PRN

## 2014-07-12 MED ORDER — ONDANSETRON HCL 4 MG/2ML IJ SOLN: 4.0000 mg | Freq: Four times a day (QID) | INTRAMUSCULAR | Status: DC | PRN

## 2014-07-12 MED ORDER — SODIUM CHLORIDE 0.9 % IJ SOLN
3.0000 mL | INTRAMUSCULAR | Status: DC | PRN
Start: 2014-07-12 — End: 2014-07-16

## 2014-07-12 MED ORDER — LATANOPROST 0.005 % OP SOLN
1.0000 [drp] | Freq: Every day | OPHTHALMIC | Status: DC
  Administered 2014-07-13 – 2014-07-15 (×3): 1 [drp] via OPHTHALMIC
  Filled 2014-07-12 (×2): qty 2.5

## 2014-07-12 MED ORDER — SIMVASTATIN 20 MG PO TABS
20.0000 mg | ORAL_TABLET | Freq: Every day | ORAL | Status: DC
  Administered 2014-07-13 – 2014-07-15 (×3): 20 mg via ORAL
  Filled 2014-07-12 (×4): qty 1

## 2014-07-12 MED ORDER — ACETAMINOPHEN 650 MG RE SUPP: 650.0000 mg | Freq: Four times a day (QID) | RECTAL | Status: DC | PRN

## 2014-07-12 MED ORDER — SODIUM CHLORIDE 0.9 % IJ SOLN
3.0000 mL | Freq: Two times a day (BID) | INTRAMUSCULAR | Status: DC
  Administered 2014-07-12 – 2014-07-15 (×5): 3 mL via INTRAVENOUS

## 2014-07-12 MED ORDER — ALUM & MAG HYDROXIDE-SIMETH 200-200-20 MG/5ML PO SUSP: 30.0000 mL | Freq: Four times a day (QID) | ORAL | Status: DC | PRN

## 2014-07-12 MED ORDER — TRAZODONE HCL 50 MG PO TABS
50.0000 mg | ORAL_TABLET | Freq: Every evening | ORAL | Status: DC | PRN
Start: 2014-07-12 — End: 2014-07-16
  Administered 2014-07-12 – 2014-07-15 (×4): 50 mg via ORAL
  Filled 2014-07-12 (×4): qty 1

## 2014-07-12 MED ORDER — ACETAMINOPHEN 325 MG PO TABS: 650.0000 mg | ORAL_TABLET | Freq: Four times a day (QID) | ORAL | Status: DC | PRN

## 2014-07-12 MED ORDER — SODIUM CHLORIDE 0.9 % IV SOLN: 250.0000 mL | INTRAVENOUS | Status: DC | PRN

## 2014-07-12 MED ORDER — ONDANSETRON HCL 4 MG PO TABS: 4.0000 mg | ORAL_TABLET | Freq: Four times a day (QID) | ORAL | Status: DC | PRN

## 2014-07-12 MED ORDER — METOPROLOL TARTRATE 25 MG PO TABS: 25.0000 mg | ORAL_TABLET | Freq: Two times a day (BID) | ORAL | Status: DC

## 2014-07-12 MED ORDER — FUROSEMIDE 40 MG PO TABS
40.0000 mg | ORAL_TABLET | Freq: Every day | ORAL | Status: DC
  Administered 2014-07-12 – 2014-07-16 (×5): 40 mg via ORAL
  Filled 2014-07-12 (×5): qty 1

## 2014-07-12 MED ORDER — OXYCODONE HCL 5 MG PO TABS
5.0000 mg | ORAL_TABLET | ORAL | Status: DC | PRN
  Administered 2014-07-13 – 2014-07-14 (×2): 5 mg via ORAL
  Filled 2014-07-12 (×2): qty 1

## 2014-07-12 NOTE — Progress Notes (Signed)
Spoke with Anesthesia- Dr Arby Barrette and updated on patient status regarding blood pressure issues  And discrepancy with meds along with EKG results from today.  DR Hatchett requested me to call Dr [name].  Dr [name] paged and made aware of patient status.  Dr [name] asked that I call [facility] office and see if they could see him this pm.  Called office of [facility] on [address] since patient refused to go to [place].  Spoke with Triage Nurse and labs came back with a potassium of 5.5 on BMP results.  [facility] office- Neal Dy stated they would see him at office and they would just turn around and admit patient to hospital.  Patient is in room and aware of all transactions along with brother.  Called Dr [name] and made him aware of above and he asked me to take patient to ED.  Patient transported to ED via wheelchair with brother.  Spoke with Johnny Bridge, RN in ED and gave her patient status report to include potassium results of 5.5.  Informed Johnny Bridge that Dr [name] wanted hospitalist to evaluate patient and admit patient.  Johnny Bridge , RN stated that I needed to call Dr [name] and ask him to call Hospitalist himself and patient would be evaluated sooner. Paged Dr [name].

## 2014-07-12 NOTE — Telephone Encounter (Signed)
Received call from Paula Compton, RN at [facility]. Reports pts BP 70s-90s systolic; HR 82-120 chronic afib; Pox 99% Edema BLE from calves Scheduled for urology procedure on Monday. Urologist requesting pt be seen today by cardiology. Has been taking metoprolol 100 mg BID, even though his AMA discharge papers apparently indicate he was to take 25 mg BID.  Reviewed with Norma Fredrickson, NP ([facility]) who reviewed patient's labs from today and states if seen pt will likely be admitted to the hospital for renal failure if he comes to the office. Paula Compton will contact the urologist to discuss further.

## 2014-07-12 NOTE — Progress Notes (Signed)
Called ED and spoke with Molli Knock at [phone] and  told them to make Johnny Bridge, RN aware that I have paged Dr [name] x 2 with no return call and spoken with office and they state he is in surgery regarding the fact that he needs to notify hospitalist and hopefully he will return my page soon.

## 2014-07-12 NOTE — Progress Notes (Signed)
Paged Dr [name] x 2 Dr [name] not returned page.  Called office and they stated he is still in surgery.

## 2014-07-12 NOTE — H&P (Signed)
[facility] History and Physical  JERELL DEMERY RUE:[id number] DOB: [birth year] DOA: 07/12/2014  Referring physician:  PCP: [name], MD   Chief Complaint:   HPI: JAKADEN OUZTS is a 81 y.o. male with a past medical history of chronic atrial fibrillation, anticoagulated with Coumadin, stage III chronic kidney disease, history of kidney stones, who was recently admitted to [facility] on 07/03/2014 where he presented with complaints of generalized weakness found to have worsening kidney function. Initial lab work then revealed a creatinine of 5.32 with BUN of 113. Acute on chronic kidney injury felt to be secondary to postobstructive uropathy as CT scan of abdomen and pelvis performed on 07/03/2014 revealed left hydronephrosis with dilated collecting system and several proximal ureteral calculi. On right there was no hydronephrosis, status post right nephrostomy tube placement. Patient was evaluated by Dr. Salomon Mast of nephrology. Possibility of dialysis was discussed then as he stated not wishing to undergo HD. Patient was treated with intravenous Lasix as right nephrostomy was found to be draining appropriately. It appears that urology consultation was offered and however patient wanted to see his primary urologist Dr [name]. Unfortunately he left AGAINST MEDICAL ADVICE from [facility] on 07/04/2014. We were asked to admit patient to the medicine service by Dr.Ottelin who was consult. Plan for ureteroscopy on Monday, questioning medical clearance. Cardiology informed of patient's hospitalization. Patient's only complaint is bilateral extremity swelling associate with scrotal swelling. He denies chest pain, shortness of breath, abdominal pain, fevers, chills, diarrhea, constipation, cough.                                                                                  Review of Systems:  Constitutional:  No weight loss, night sweats, Fevers, chills, fatigue.  HEENT:    No headaches, Difficulty swallowing,Tooth/dental problems,Sore throat,  No sneezing, itching, ear ache, nasal congestion, post nasal drip,  Cardio-vascular:  No chest pain, Orthopnea, PND, swelling in lower extremities, anasarca, dizziness, palpitations  GI:  No heartburn, indigestion, abdominal pain, nausea, vomiting, diarrhea, change in bowel habits, loss of appetite  Resp:  No shortness of breath with exertion or at rest. No excess mucus, no productive cough, No non-productive cough, No coughing up of blood.No change in color of mucus.No wheezing.No chest wall deformity  Skin:  no rash or lesions.  GU:  no dysuria, change in color of urine, no urgency or frequency. No flank pain.  Musculoskeletal:  Positive for extremity edema  Psych:  No change in mood or affect. No depression or anxiety. No memory loss.   Past Medical History  Diagnosis Date  . Hyperlipidemia   . White coat hypertension   . Cardiomyopathy      EF:30-40% in 06/1998, but 50-55% in 2009;  HX ISCHEMIC CARDIOMYOPATHY  . Chronic atrial fibrillation   . Vitamin B12 deficiency   . Chronic anticoagulation   . Arthritis   . Sensory neuropathy   . Chronic kidney disease, stage III (moderate)     creatinine 1.5 2001, 2.0 2009, 2.28 in 12/2009  . Mild renal insufficiency   . Hydronephrosis, bilateral   . Ureteral calculi     BILATERAL  . History  of CHF (congestive heart failure)     1999  &  2005  . History of kidney stones   . Bilateral renal cysts   . 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 Chronic venous insufficiency   . PAD (peripheral artery disease)   . Edema of both legs   . History of gout     per pt stable  . Glaucoma of both eyes   . Wears hearing aid     bilateral  . CHF (congestive heart failure)   . Shortness of breath dyspnea    Past Surgical History  Procedure Laterality Date   . Repair thoracic aorta  01/1995    s/p rupture  . Cataract extraction w/ intraocular lens  implant, bilateral  2012  . 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 Laparoscopic cholecystectomy  JAN 2005  . Percutaneous nephrostolithotomy  1970's  . Abdominal aortagram  07-16-2003  DR ROTHBART    W/ COIL EMBOLIZATION OF SIX BRANCHES OF RIGHT INTERNAL RENAL ARTERY ANEURYSM  . Cardiovascular stress test  04-25-2003    LOW RISK CARDIOLITE STUDY/ MODERATE LV DILATATION AND IMPAIRMENT OF LVSF/  NO ISCHEMIA  . Transthoracic echocardiogram  02-07-2008  DR ROTHBART    MODERATE DILATED LV/  EF 50-55%/ MILD AR/ MILD TO MODERATE AORTIC ROOT DILATATION/ MODERATE MR/ MODERATE LA   &  RA   DIILATED/ MILD TO MODERATE TR  . Cystoscopy w/ ureteral stent placement Bilateral 10/24/2012    Procedure: CYSTOSCOPY WITH RETROGRADE PYELOGRAM/URETERAL STENT PLACEMENT;  Surgeon: Lindaann Slough, MD;  Location: [facility];  Service: Urology;  Laterality: Bilateral;  . Cystoscopy with ureteroscopy Left 11/27/2012    Procedure: CYSTOSCOPY,LEFT URETEROSCOPY WITH LASER LITHOTRIPSY/REMOVAL OF MIGRATED STENT, PLACEMENT OF LEFT STENT;  Surgeon: Garnett Farm, MD;  Location: [facility];  Service: Urology;  Laterality: Left;  . Holmium laser application Left 11/27/2012    Procedure: HOLMIUM LASER APPLICATION;  Surgeon: Garnett Farm, MD;  Location: [facility];  Service: Urology;  Laterality: Left;  . Cystoscopy with ureteroscopy and stent placement N/A 12/29/2012    Procedure: CYSTOSCOPY WITH URETEROSCOPY AND STENT PLACEMENT, removal of right and left stents, retrogrades, right stent exchange;  Surgeon: Garnett Farm, MD;  Location: [facility];  Service: Urology;  Laterality: N/A;  . Holmium laser application Right 12/29/2012    Procedure: HOLMIUM LASER APPLICATION;  Surgeon: Garnett Farm, MD;  Location: [facility];  Service: Urology;  Laterality: Right;  HLL OF (RT) URETERAL PELVIC  JUNCTION STONE  . Cystoscopy with retrograde pyelogram, ureteroscopy and stent placement Right 01/29/2013    Procedure: CYSTOSCOPY WITH RIGHT URETEROSCOPY AND STENT PLACEMENT;  Surgeon: Garnett Farm, MD;  Location: [facility];  Service: Urology;  Laterality: Right;  . Holmium laser application N/A 01/29/2013    Procedure: HOLMIUM LASER APPLICATION;  Surgeon: Garnett Farm, MD;  Location: [facility];  Service: Urology;  Laterality: N/A;  . Esophagogastroduodenoscopy N/A 02/16/2013    Procedure: ESOPHAGOGASTRODUODENOSCOPY (EGD);  Surgeon: Meryl Dare, MD;  Location: [facility];  Service: Endoscopy;  Laterality: N/A;  . Cystoscopy with retrograde pyelogram, ureteroscopy and stent placement Right 05/07/2013    Procedure: CYSTOSCOPY WITH RIGHT RETROGRADE PYELOGRAM, Balloon dilation of right ureter, Laser incision of right ureter, Nephrostogram;  Surgeon: Garnett Farm, MD;  Location: [facility];  Service: Urology;  Laterality: Right;  . Holmium laser application Right 05/07/2013    Procedure: HOLMIUM LASER APPLICATION;  Surgeon: Garnett Farm, MD;  Location: [facility];  Service: Urology;  Laterality: Right;   Social History:  reports that he has never smoked. He has never used smokeless tobacco. He reports that he does not drink alcohol or use illicit drugs.  Allergies  Allergen Reactions  . Sulfa Antibiotics Rash    Nausea     Family History  Problem Relation Age of Onset  . Heart disease Mother   . Early death Father   . Aneurysm Brother   . Aortic aneurysm Brother   . Diabetes Sister      Prior to Admission medications   Medication Sig Start Date End Date Taking? Authorizing Provider  acetaminophen (TYLENOL) 500 MG tablet Take 1,000 mg by mouth every 6 (six) hours as needed for mild pain, moderate pain or headache.    Yes Historical Provider, MD  amLODipine (NORVASC) 2.5 MG tablet Take 1 tablet (2.5 mg total) by mouth daily. 07/01/14  Yes Jodelle Gross, NP  amLODipine (NORVASC) 5 MG tablet  Take 5 mg by mouth every morning.   Yes Historical Provider, MD  colchicine (COLCRYS) 0.6 MG tablet TAKE ONE TABLET BY MOUTH 2 TIMES A DAY FOR GOUT. Patient taking differently: Take 0.6 mg by mouth daily.  12/24/13  Yes Merlyn Albert, MD  Cyanocobalamin (VITAMIN B-12) 2000 MCG TBCR Take 2,000 mcg by mouth daily.    Yes Historical Provider, MD  furosemide (LASIX) 40 MG tablet Take 1 and 1/2 tablets by  mouth every morning Patient taking differently: take 1 tablet by mouth every morning. 05/21/14  Yes Merlyn Albert, MD  latanoprost (XALATAN) 0.005 % ophthalmic solution Place 1 drop into both eyes at bedtime.  08/12/12  Yes Historical Provider, MD  losartan (COZAAR) 50 MG tablet Take 50 mg by mouth every morning.   Yes Historical Provider, MD  metoprolol (LOPRESSOR) 100 MG tablet Take 1 tablet by mouth two  times daily 03/19/14  Yes Antoine Poche, MD  simvastatin (ZOCOR) 20 MG tablet Take 20 mg by mouth daily.   Yes Historical Provider, MD  zolpidem (AMBIEN) 10 MG tablet TAKE 1 TABLET AT BEDTIME AS NEEDED FOR SLEEP. Patient taking differently: Take 10 mg by mouth at bedtime as needed for sleep. TAKE 1 TABLET AT BEDTIME AS NEEDED FOR SLEEP. 07/01/14  Yes Merlyn Albert, MD  warfarin (COUMADIN) 2.5 MG tablet Take 2.5 mg by mouth See admin instructions. Per 3/7 visit, Hold coumadin 3/7 and 3/8. Take 1/2 on Wednesday 3/9,  then resume 1/2 tablet daily except none on Wednesdays    Historical Provider, MD   Physical Exam: Filed Vitals:   07/12/14 1539 07/12/14 1630 07/12/14 1733 07/12/14 1738  BP: 103/75 99/65    Pulse: 91 70    Temp: 97.4 F (36.3 C)     TempSrc: Oral     Resp: 18 20    Height:   (1.778 m)    Weight:  85.73 kg (189 lb)    SpO2: 95% 98% 92% 99%    Wt Readings from Last 3 Encounters:  07/12/14 85.73 kg (189 lb)  07/12/14 87.544 kg (193 lb)  07/04/14 85.9 kg (189 lb 6 oz)    General:  Appears calm and comfortable, he is in no acute distress, awake alert and  oriented Eyes: PERRL, normal lids, irises & conjunctiva ENT:  grossly normal hearing, lips & tongue Neck: no LAD, masses or thyromegaly Cardiovascular: Irregular rate and rhythm, no m/r/g. Has 2-3+ bilateral extremity pitting edema Telemetry: Atrial fibrillation Respiratory: CTA bilaterally, no w/r/r. Normal respiratory effort. Abdomen: soft, ntnd Skin: no rash or induration seen on limited exam Musculoskeletal: Patient has 2-3+ bilateral extremity pitting edema Genitourinary: Positive scrotal edema Psychiatric: grossly normal mood and affect, speech fluent and appropriate Neurologic: grossly non-focal.          Labs on Admission:  Basic Metabolic Panel:  Recent Labs Lab 07/12/14 1130  NA 142  K 5.5*  CL 112  CO2 21  GLUCOSE 91  BUN 100*  CREATININE 3.71*  CALCIUM 8.5   Liver Function Tests: No results for input(s): AST, ALT, ALKPHOS, BILITOT, PROT, ALBUMIN in the last 168 hours. No results for input(s): LIPASE, AMYLASE in the last 168 hours. No results for input(s): AMMONIA in the last 168 hours. CBC:  Recent Labs Lab 07/12/14 1130  WBC 5.0  HGB 11.4*  HCT 36.1*  MCV 99.2  PLT 83*   Cardiac Enzymes: No results for input(s): CKTOTAL, CKMB, CKMBINDEX, TROPONINI in the last 168 hours.  BNP (last 3 results)  Recent Labs  07/03/14 1200  BNP 740.0*    ProBNP (last 3 results)  Recent Labs  04/09/14 1038  PROBNP 9656.0*    CBG: No results for input(s): GLUCAP in the last 168 hours.  Radiological Exams on Admission: Dg Chest 2 View  07/12/2014   CLINICAL DATA:  Irregular heartbeat  EXAM: CHEST  2 VIEW  COMPARISON:  07/03/2014  FINDINGS: Persistent changes in the right lung base are noted consistent with effusion and underlying atelectasis/ infiltrate. Postsurgical changes are again seen. The cardiac shadow is enlarged. The left lung remains clear.  IMPRESSION: Persistent right-sided effusion and basilar atelectasis/infiltrate. The overall appearance is  stable from the prior exam.   Electronically Signed   By: Alcide Clever M.D.   On: 07/12/2014 14:13    EKG: Independently reviewed. Atrial fibrillation  Assessment/Plan Active Problems:   Atrial fibrillation, chronic   Acute on chronic renal failure   Thrombocytopenia   Systolic CHF, chronic   HTN (hypertension)   Kidney stones   Hydronephrosis   Hyperkalemia   Renal failure   AKI (acute kidney injury)   1. Preoperative clearance. Recent imaging performed at [facility] revealed the presence of left sided hydronephrosis associate with dilated collecting system and proximal ureteral calculi. He has a history of right nephrostomy tube placement. He was recently evaluated for acute on chronic renal failure felt to be secondary to postobstructive nephropathy. Patient's medical problems include chronic atrial fibrillation, history of systolic congestive heart failure and hypertension. In 2000 and appeared he had an ejection fraction of 40%, with a transthoracic echocardiogram in 2009 showing an improved EF of 50-55%. Will check a transthoracic echocardiogram and chest x-ray. He had 3 negative troponins on recent hospitalization. Holding Coumadin for anticipated procedure.  2. Postobstructive nephropathy/Acute on chronic renal failure. As mentioned above CT scan performed at a recent hospitalization showed hydronephrosis associated with dilated collecting system and proximal ureteral calculi. His kidney function has improved with creatinine coming down from 5.32 on 07/03/2014 to 3.71 on today's lab work. It appears he has a baseline creatinine near 3.4. Ureteroscopy has been scheduled for Monday.  3. Hypertension. Patient on multiple antihypertensive agents, presenting with systolic blood pressures in the 90s to low 100s. Will stop amlodipine, losartan and metoprolol today, follow-up on blood pressures  in a.m., consider restarting metoprolol.  4. Chronic atrial fibrillation. He is currently  rate controlled having ventricular rates in the 70s. Due to hypotension I am holding his beta blocker for now, reassess in a.m. Plan for urologic procedure on Monday, holding Coumadin therapy. 5. History of chronic systolic congestive heart failure. Patient having a history of cardiomyopathy, last echo performed in 2009 which showed improvement to EF at 55%. Will obtain transthoracic echocardiogram at this time. Meanwhile continue Lasix 40 mg by mouth daily 6. Hyperkalemia. Labs showing potassium of 5.5, repeat BMP in a.m. 7. DVT prophylaxis. SCDs to lower extremities    Code Status: DNR Family Communication:  Disposition Plan: Will admit to inpatient service, anticipate will require greater than 2 nights hospitalization  Time spent: 70 min  Jeralyn Bennett [facility] Pager [phone]

## 2014-07-12 NOTE — Progress Notes (Signed)
Repaged Dr [name] and made him aware that Johnny Bridge, RN in ED stated for him to call Hospitalist to see patient and that would expedite patient care and take out the ER doctor and that would get  Patient a bed faster.

## 2014-07-12 NOTE — Progress Notes (Signed)
CXR results done 07/12/2014 faxed via EPIC to Dr [name]

## 2014-07-12 NOTE — Progress Notes (Signed)
CBC, BMP, PT, PTT results faxed via EPIC to Dr [name].

## 2014-07-12 NOTE — ED Notes (Addendum)
Per Short Stay RN, Pt here for pre op for ureteroscopy on Monday.  Pt vitals hypotensive on assessment systolic 70-80's.  Pt had labs drawn and repeated CXR.  Potassium 5.5.  Pt has chronic a-fib, CHF.

## 2014-07-12 NOTE — Progress Notes (Signed)
AT time of preop appointment blood pressure in right arm was 79/57 pulse- 82-120.  In right arm. In left arm blood pressure was 92/53.  Sat 99% on room air.  At end of preop appointment blood pressure 98/63 in left arm and 80/41 in right arm.  Visible edema in bilateral lower extremities.  Patient checked out AMA from hospital on 07/04/2014 and supposed to be taking Lopressor 25 mg po 2 times daily per discharge summary.  Patient reports taking 100mg  2 times daily.  Brother with patient at time of preop appointment.  EKG shows atrial fib ( which is chronic for patient) and some inferior and lateral ischemia.  Paged Dr Arby Barrette with anesthesia.  Repaged Dr Arby Barrette with anesthesia.  Patient taken to Xray via wheelchair .  Patient came to hospital with walker.

## 2014-07-13 DIAGNOSIS — Z7901 Long term (current) use of anticoagulants: Secondary | ICD-10-CM

## 2014-07-13 DIAGNOSIS — I5022 Chronic systolic (congestive) heart failure: Secondary | ICD-10-CM

## 2014-07-13 DIAGNOSIS — I482 Chronic atrial fibrillation: Secondary | ICD-10-CM

## 2014-07-13 DIAGNOSIS — N200 Calculus of kidney: Secondary | ICD-10-CM

## 2014-07-13 LAB — CBC
HCT: 35.4 % — ABNORMAL LOW (ref 39.0–52.0)
Hemoglobin: 11.1 g/dL — ABNORMAL LOW (ref 13.0–17.0)
MCH: 31 pg (ref 26.0–34.0)
MCHC: 31.4 g/dL (ref 30.0–36.0)
MCV: 98.9 fL (ref 78.0–100.0)
Platelets: 81 10*3/uL — ABNORMAL LOW (ref 150–400)
RBC: 3.58 MIL/uL — ABNORMAL LOW (ref 4.22–5.81)
RDW: 17.9 % — ABNORMAL HIGH (ref 11.5–15.5)
WBC: 4.8 10*3/uL (ref 4.0–10.5)

## 2014-07-13 LAB — BASIC METABOLIC PANEL
Anion gap: 12 (ref 5–15)
BUN: 95 mg/dL — ABNORMAL HIGH (ref 6–23)
CO2: 19 mmol/L (ref 19–32)
CREATININE: 3.43 mg/dL — AB (ref 0.50–1.35)
Calcium: 8.6 mg/dL (ref 8.4–10.5)
Chloride: 113 mmol/L — ABNORMAL HIGH (ref 96–112)
GFR calc Af Amer: 17 mL/min — ABNORMAL LOW (ref >90)
GFR calc non Af Amer: 15 mL/min — ABNORMAL LOW (ref >90)
Glucose, Bld: 77 mg/dL (ref 70–99)
Potassium: 4.9 mmol/L (ref 3.5–5.1)
Sodium: 144 mmol/L (ref 135–145)

## 2014-07-13 LAB — URINE MICROSCOPIC-ADD ON

## 2014-07-13 LAB — URINALYSIS, ROUTINE W REFLEX MICROSCOPIC
Bilirubin Urine: NEGATIVE
Glucose, UA: NEGATIVE mg/dL
Ketones, ur: NEGATIVE mg/dL
Nitrite: NEGATIVE
PROTEIN: 100 mg/dL — AB
Specific Gravity, Urine: 1.012 (ref 1.005–1.030)
Urobilinogen, UA: 0.2 mg/dL (ref 0.0–1.0)
pH: 6 (ref 5.0–8.0)

## 2014-07-13 MED ORDER — CEFTRIAXONE SODIUM IN DEXTROSE 20 MG/ML IV SOLN
1.0000 g | INTRAVENOUS | Status: DC
  Administered 2014-07-13 – 2014-07-15 (×3): 1 g via INTRAVENOUS
  Filled 2014-07-13 (×4): qty 50

## 2014-07-13 MED ORDER — METOPROLOL TARTRATE 25 MG PO TABS
12.5000 mg | ORAL_TABLET | Freq: Two times a day (BID) | ORAL | Status: DC
  Administered 2014-07-13 – 2014-07-16 (×7): 12.5 mg via ORAL
  Filled 2014-07-13 (×7): qty 1

## 2014-07-13 NOTE — Consult Note (Signed)
CARDIOLOGY CONSULT NOTE   Patient ID: [name] MRN: [id number] DOB/AGE: July 15, 1925 81 y.o.  Admit Date: 07/12/2014  Primary Physician: [name], MD  Primary Cardiologist    [facility]   Clinical Summary [name] is a 82 y.o.male. The patient is admitted with renal insufficiency related to renal stones. We are asked to see him for cardiac clearance for urologic procedures. Historically the patient had left ventricular dysfunction. Echo was over time showed improvement in LV function. However the last echo was done in 2009 with an ejection fraction of 50%. More recently he's had shortness of breath. Plans were being made for follow-up outpatient 2-D echo but had has not yet been done. The patient says he is not feeling short of breath. He is not having any chest pain. However his chest x-ray does show a right pleural effusion. He has mild peripheral edema. There is chronic atrial fibrillation. He is chronically anticoagulated with Coumadin. Coumadin was put on hold by the primary team today.   Allergies  Allergen Reactions  . Sulfa Antibiotics Rash    Nausea     Medications Scheduled Medications: . cefTRIAXone (ROCEPHIN)  IV  1 g Intravenous Q24H  . furosemide  40 mg Oral Daily  . latanoprost  1 drop Both Eyes QHS  . metoprolol tartrate  12.5 mg Oral BID  . simvastatin  20 mg Oral q1800  . sodium chloride  3 mL Intravenous Q12H  . sodium chloride  3 mL Intravenous Q12H     Infusions:     PRN Medications:  sodium chloride, acetaminophen **OR** acetaminophen, alum & mag hydroxide-simeth, morphine injection, ondansetron **OR** ondansetron (ZOFRAN) IV, oxyCODONE, sodium chloride, traZODone   Past Medical History  Diagnosis Date  . Hyperlipidemia   . White coat hypertension   . Cardiomyopathy      EF:30-40% in 06/1998, but 50-55% in 2009;  HX ISCHEMIC CARDIOMYOPATHY  . Chronic atrial fibrillation   . Vitamin B12 deficiency   . Chronic  anticoagulation   . Arthritis   . Sensory neuropathy   . Chronic kidney disease, stage III (moderate)     creatinine 1.5 2001, 2.0 2009, 2.28 in 12/2009  . Mild renal insufficiency   . Hydronephrosis, bilateral   . Ureteral calculi     BILATERAL  . History of CHF (congestive heart failure)     1999  &  2005  . History of kidney stones   . Bilateral renal cysts   . 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 Chronic venous insufficiency   . PAD (peripheral artery disease)   . Edema of both legs   . History of gout     per pt stable  . Glaucoma of both eyes   . Wears hearing aid     bilateral  . CHF (congestive heart failure)   . Shortness of breath dyspnea     Past Surgical History  Procedure Laterality Date  . Repair thoracic aorta  01/1995    s/p rupture  . Cataract extraction w/ intraocular lens  implant, bilateral  2012  . 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 Laparoscopic cholecystectomy  JAN 2005  . Percutaneous nephrostolithotomy  1970's  . Abdominal aortagram  07-16-2003  DR ROTHBART    W/ COIL EMBOLIZATION  OF SIX BRANCHES OF RIGHT INTERNAL RENAL ARTERY ANEURYSM  . Cardiovascular stress test  04-25-2003    LOW RISK CARDIOLITE STUDY/ MODERATE LV DILATATION AND IMPAIRMENT OF LVSF/  NO ISCHEMIA  . Transthoracic echocardiogram  02-07-2008  DR ROTHBART    MODERATE DILATED LV/  EF 50-55%/ MILD AR/ MILD TO MODERATE AORTIC ROOT DILATATION/ MODERATE MR/ MODERATE LA   &  RA   DIILATED/ MILD TO MODERATE TR  . Cystoscopy w/ ureteral stent placement Bilateral 10/24/2012    Procedure: CYSTOSCOPY WITH RETROGRADE PYELOGRAM/URETERAL STENT PLACEMENT;  Surgeon: Lindaann Slough, MD;  Location: [facility];  Service: Urology;  Laterality: Bilateral;  . Cystoscopy with ureteroscopy Left 11/27/2012    Procedure:  CYSTOSCOPY,LEFT URETEROSCOPY WITH LASER LITHOTRIPSY/REMOVAL OF MIGRATED STENT, PLACEMENT OF LEFT STENT;  Surgeon: Garnett Farm, MD;  Location: [facility];  Service: Urology;  Laterality: Left;  . Holmium laser application Left 11/27/2012    Procedure: HOLMIUM LASER APPLICATION;  Surgeon: Garnett Farm, MD;  Location: [facility];  Service: Urology;  Laterality: Left;  . Cystoscopy with ureteroscopy and stent placement N/A 12/29/2012    Procedure: CYSTOSCOPY WITH URETEROSCOPY AND STENT PLACEMENT, removal of right and left stents, retrogrades, right stent exchange;  Surgeon: Garnett Farm, MD;  Location: [facility];  Service: Urology;  Laterality: N/A;  . Holmium laser application Right 12/29/2012    Procedure: HOLMIUM LASER APPLICATION;  Surgeon: Garnett Farm, MD;  Location: [facility];  Service: Urology;  Laterality: Right;  HLL OF (RT) URETERAL PELVIC JUNCTION STONE  . Cystoscopy with retrograde pyelogram, ureteroscopy and stent placement Right 01/29/2013    Procedure: CYSTOSCOPY WITH RIGHT URETEROSCOPY AND STENT PLACEMENT;  Surgeon: Garnett Farm, MD;  Location: [facility];  Service: Urology;  Laterality: Right;  . Holmium laser application N/A 01/29/2013    Procedure: HOLMIUM LASER APPLICATION;  Surgeon: Garnett Farm, MD;  Location: [facility];  Service: Urology;  Laterality: N/A;  . Esophagogastroduodenoscopy N/A 02/16/2013    Procedure: ESOPHAGOGASTRODUODENOSCOPY (EGD);  Surgeon: Meryl Dare, MD;  Location: [facility];  Service: Endoscopy;  Laterality: N/A;  . Cystoscopy with retrograde pyelogram, ureteroscopy and stent placement Right 05/07/2013    Procedure: CYSTOSCOPY WITH RIGHT RETROGRADE PYELOGRAM, Balloon dilation of right ureter, Laser incision of right ureter, Nephrostogram;  Surgeon: Garnett Farm, MD;  Location: [facility];  Service: Urology;  Laterality: Right;  . Holmium laser application Right 05/07/2013    Procedure: HOLMIUM LASER APPLICATION;  Surgeon: Garnett Farm, MD;  Location: [facility];  Service:  Urology;  Laterality: Right;    Family History  Problem Relation Age of Onset  . Heart disease Mother   . Early death Father   . Aneurysm Brother   . Aortic aneurysm Brother   . Diabetes Sister     Social History Mr. Hunger reports that he has never smoked. He has never used smokeless tobacco. Mr. Raybourn reports that he does not drink alcohol.  Review of Systems  Patient denies fever, chills, headache, sweats, rash, change in vision, change in hearing, chest pain, cough, nausea or vomiting, urinary symptoms. All other systems are reviewed and are negative.  Physical Examination Blood pressure 102/63, pulse 95, temperature 97.5 F (36.4 C), temperature source Oral, resp. rate 18, height 5\' 10"  (1.778 m), weight 189 lb (85.73 kg), SpO2 96 %.  Intake/Output Summary (Last 24 hours) at 07/13/14 1638 Last data filed at 07/13/14 1605  Gross per 24 hour  Intake    200 ml  Output   1225 ml  Net  -1025 ml    Patient is oriented to person time and place. Affect is normal. There are family members in the room. Head is atraumatic. Sclera and conjunctiva are normal. There is no jugular venous distention. Lungs reveal decreased breath sounds at the bases. No rales are heard. Cardiac exam reveals S1 and S2. The rhythm is irregularly irregular. Abdomen is soft. There is 1+ peripheral edema. There are no musculoskeletal deformities. There are no skin rashes. Neurologic is grossly intact.  Prior Cardiac Testing/Procedures  Lab Results  Basic Metabolic Panel:  Recent Labs Lab 07/12/14 1130 07/13/14 0543  NA 142 144  K 5.5* 4.9  CL 112 113*  CO2 21 19  GLUCOSE 91 77  BUN 100* 95*  CREATININE 3.71* 3.43*  CALCIUM 8.5 8.6    Liver Function Tests: No results for input(s): AST, ALT, ALKPHOS, BILITOT, PROT, ALBUMIN in the last 168 hours.  CBC:  Recent Labs Lab 07/12/14 1130 07/13/14 0543  WBC 5.0 4.8  HGB 11.4* 11.1*  HCT 36.1* 35.4*  MCV 99.2 98.9  PLT 83* 81*    Cardiac  Enzymes: No results for input(s): CKTOTAL, CKMB, CKMBINDEX, TROPONINI in the last 168 hours.  BNP: Invalid input(s): POCBNP   Radiology: Dg Chest 1 View  07/12/2014   CLINICAL DATA:  Congestive Heart Failure; congested cough; no other complaints from pt  EXAM: CHEST  1 VIEW  COMPARISON:  07/12/2014 at 11:36 a.m.  FINDINGS: Moderate 2 large right-sided pleural effusion is without significant change from the prior exam allowing for the semi-erect positioning on the current study. Associated right lung base opacity, likely atelectasis, is also stable. No convincing pulmonary edema. No left pleural effusion.  Stable cardiomegaly.  No pneumothorax.  IMPRESSION: No convincing change from the radiograph obtained earlier the same date. Moderate to large right pleural effusion with associated right lung base opacity, most likely atelectasis. No convincing pulmonary edema.   Electronically Signed   By: Amie Portland M.D.   On: 07/12/2014 21:50   Dg Chest 2 View  07/12/2014   CLINICAL DATA:  Irregular heartbeat  EXAM: CHEST  2 VIEW  COMPARISON:  07/03/2014  FINDINGS: Persistent changes in the right lung base are noted consistent with effusion and underlying atelectasis/ infiltrate. Postsurgical changes are again seen. The cardiac shadow is enlarged. The left lung remains clear.  IMPRESSION: Persistent right-sided effusion and basilar atelectasis/infiltrate. The overall appearance is stable from the prior exam.   Electronically Signed   By: Alcide Clever M.D.   On: 07/12/2014 14:13     ECG: I have reviewed current and old EKGs. There is interventricular conduction delay with diffuse T-wave changes. There is no significant change.   Impression and Recommendations    Atrial fibrillation, chronic    The patient's atrial fibrillation is mildly elevated. I've chosen not to change his medications. I would like to follow his heart rate as he is gingerly diuresed.    Acute on chronic renal failure     Renal  function is looking slightly better with some diuresis. I'm hopeful that he will improve further after his urologic procedures.    Thrombocytopenia    Systolic CHF, chronic     I history he had left ventricular dysfunction in the past that improved. His last echo was done 2009. An echo has been ordered for this admission. He does have a right pleural effusion. He is slowly diuresing at this point. With his renal dysfunction I  am hesitant to push his diuretics further. I'm hoping he will continue to stabilize and be ready for urologic procedures on Monday. He does not need any further testing for this.    HTN (hypertension)    Kidney stones   Hydronephrosis     Hopefully this can be managed by urology. He is a complex patient.    Warfarin anticoagulation    The patient has been on Coumadin for his atrial fibrillation. This has been put on hold for this admission for his urologic procedures. I am in agreement with this. His Coumadin can be restarted after his urologic procedures.   Jerral Bonito, MD 07/13/2014, 4:38 PM

## 2014-07-13 NOTE — Progress Notes (Signed)
[facility] PROGRESS NOTE  [name] TZG:[id number] DOB: [birth year] DOA: 07/12/2014 PCP: [name], MD  Assessment/Plan: 1. Preoperative clearance. -Patient with multiple comorbidities including chronic atrial fibrillation and cardiomyopathy. I have discussed case with cardiology who will consult. -Wll obtain a transthoracic echocardiogram to assess LVEF, he currently denies shortness of breath or chest pain as he does not appear to have active cardiopulmonary issues. -His chest x-ray was negative for pulmonary edema and infiltrate -Treating urinary tract infection -Await cardiology's input  2.  Chronic atrial fibrillation. -I held antihypertensives on 07/12/2014 because of hypotension. After discussion with cardiology have added back metoprolol at 12.5 mg by mouth twice a day -Anticoagulation has been held for upcoming procedure  3.   Urinary tract infection . -Urinalysis revealed the presence of many bacteria and white blood cells, started ceftriaxone 1 g IV every 24 hours  4.   History of chronic systolic congestive heart failure -Pending transthoracic echocardiogram, last echo was performed in 2009. Continue Lasix 40 mg by mouth daily  5.  Acute on chronic kidney failure -He recently presented to outside hospital having creatinine of 5.32 and BUN 113, likely due to postobstructive nephropathy. -Creatinine continues to trend down to 3.43  Code Status: DO NOT RESUSCITATE Family Communication:  Disposition Plan:    Consultants:  Urology  Cardiology   Antibiotics:  Ceftriaxone  HPI/Subjective: Patient is a pleasant 81 year old gentleman with a past medical history of chronic atrial fibrillation, previously on anticoagulation, stage III chronic unit disease, admitted to the medicine service on 07/12/2014. He was recently admitted to [facility] on 07/03/2014 presented with likely postobstructive uropathy, unfortunately left AGAINST MEDICAL ADVICE on  07/04/2014. CT scan of abdomen and pelvis performed on 07/02/2004 and revealed left hydronephrosis with dilated collecting system and proximal ureteral calculi. Status post right nephrostomy tube placement. His urologist Dr. [name] requesting medicine to admit as patient will likely undergo ureteroscopy on Monday. Patient has multiple medical conditions, and requested preoperative clearance.  Objective: Filed Vitals:   07/13/14 1418  BP: 102/63  Pulse: 95  Temp: 97.5 F (36.4 C)  Resp: 18    Intake/Output Summary (Last 24 hours) at 07/13/14 1550 Last data filed at 07/13/14 1251  Gross per 24 hour  Intake    200 ml  Output    775 ml  Net   -575 ml   Filed Weights   07/12/14 1630  Weight: 85.73 kg (189 lb)    Exam:   General:  Patient is awake and alert, reports feeling the same  Cardiovascular: Irregular rate and rhythm normal S1-S2 no murmurs rubs or gallops, 2+ bilateral extremity pitting edema  Respiratory: Normal respiratory effort, lungs overall clear to auscultation bilaterally  Abdomen: Soft nontender nondistended positive bowel sounds  Musculoskeletal: +2+ bilateral extremity pitting edema  Data Reviewed: Basic Metabolic Panel:  Recent Labs Lab 07/12/14 1130 07/13/14 0543  NA 142 144  K 5.5* 4.9  CL 112 113*  CO2 21 19  GLUCOSE 91 77  BUN 100* 95*  CREATININE 3.71* 3.43*  CALCIUM 8.5 8.6   Liver Function Tests: No results for input(s): AST, ALT, ALKPHOS, BILITOT, PROT, ALBUMIN in the last 168 hours. No results for input(s): LIPASE, AMYLASE in the last 168 hours. No results for input(s): AMMONIA in the last 168 hours. CBC:  Recent Labs Lab 07/12/14 1130 07/13/14 0543  WBC 5.0 4.8  HGB 11.4* 11.1*  HCT 36.1* 35.4*  MCV 99.2 98.9  PLT 83* 81*   Cardiac Enzymes:  No results for input(s): CKTOTAL, CKMB, CKMBINDEX, TROPONINI in the last 168 hours. BNP (last 3 results)  Recent Labs  07/03/14 1200  BNP 740.0*    ProBNP (last 3  results)  Recent Labs  04/09/14 1038  PROBNP 9656.0*    CBG: No results for input(s): GLUCAP in the last 168 hours.  No results found for this or any previous visit (from the past 240 hour(s)).   Studies: Dg Chest 1 View  07/12/2014   CLINICAL DATA:  Congestive Heart Failure; congested cough; no other complaints from pt  EXAM: CHEST  1 VIEW  COMPARISON:  07/12/2014 at 11:36 a.m.  FINDINGS: Moderate 2 large right-sided pleural effusion is without significant change from the prior exam allowing for the semi-erect positioning on the current study. Associated right lung base opacity, likely atelectasis, is also stable. No convincing pulmonary edema. No left pleural effusion.  Stable cardiomegaly.  No pneumothorax.  IMPRESSION: No convincing change from the radiograph obtained earlier the same date. Moderate to large right pleural effusion with associated right lung base opacity, most likely atelectasis. No convincing pulmonary edema.   Electronically Signed   By: Amie Portland M.D.   On: 07/12/2014 21:50   Dg Chest 2 View  07/12/2014   CLINICAL DATA:  Irregular heartbeat  EXAM: CHEST  2 VIEW  COMPARISON:  07/03/2014  FINDINGS: Persistent changes in the right lung base are noted consistent with effusion and underlying atelectasis/ infiltrate. Postsurgical changes are again seen. The cardiac shadow is enlarged. The left lung remains clear.  IMPRESSION: Persistent right-sided effusion and basilar atelectasis/infiltrate. The overall appearance is stable from the prior exam.   Electronically Signed   By: Alcide Clever M.D.   On: 07/12/2014 14:13    Scheduled Meds: . cefTRIAXone (ROCEPHIN)  IV  1 g Intravenous Q24H  . furosemide  40 mg Oral Daily  . latanoprost  1 drop Both Eyes QHS  . metoprolol tartrate  12.5 mg Oral BID  . simvastatin  20 mg Oral q1800  . sodium chloride  3 mL Intravenous Q12H  . sodium chloride  3 mL Intravenous Q12H   Continuous Infusions:   Active Problems:   Atrial  fibrillation, chronic   Acute on chronic renal failure   Thrombocytopenia   Systolic CHF, chronic   HTN (hypertension)   Kidney stones   Hydronephrosis   Hyperkalemia   Renal failure   AKI (acute kidney injury)    Time spent: 35 minutes    Jeralyn Bennett  [facility] Pager [phone]. If 7PM-7AM, please contact night-coverage at [email or web address], password [facility] 07/13/2014, 3:50 PM  LOS: 1 day

## 2014-07-14 DIAGNOSIS — N189 Chronic kidney disease, unspecified: Secondary | ICD-10-CM

## 2014-07-14 DIAGNOSIS — N179 Acute kidney failure, unspecified: Secondary | ICD-10-CM

## 2014-07-14 DIAGNOSIS — I10 Essential (primary) hypertension: Secondary | ICD-10-CM

## 2014-07-14 DIAGNOSIS — I509 Heart failure, unspecified: Secondary | ICD-10-CM

## 2014-07-14 NOTE — Progress Notes (Signed)
SUBJECTIVE:  No complaints  OBJECTIVE:   Vitals:   Filed Vitals:   07/13/14 0441 07/13/14 1418 07/13/14 2132 07/14/14 0502  BP: 101/70 102/63 93/64 95/56   Pulse: 92 95 94 93  Temp: 98.2 F (36.8 C) 97.5 F (36.4 C) 98 F (36.7 C) 97.7 F (36.5 C)  TempSrc: Oral Oral Oral Oral  Resp: 20 18 20 20   Height:      Weight:    189 lb 12.8 oz (86.093 kg)  SpO2: 96%  94% 98%   I&O's:   Intake/Output Summary (Last 24 hours) at 07/14/14 0851 Last data filed at 07/14/14 0502  Gross per 24 hour  Intake    200 ml  Output   1175 ml  Net   -975 ml   TELEMETRY: Reviewed telemetry pt in atrial fibrillation at 83bpm:     PHYSICAL EXAM General: Well developed, well nourished, in no acute distress Head: Eyes PERRLA, No xanthomas.   Normal cephalic and atramatic  Lungs:  Decreased BS at bases Heart:   Irregularly irregular S1 S2 Pulses are 2+ & equal. Abdomen: Bowel sounds are positive, abdomen soft and non-tender without masses  Extremities:   No clubbing, cyanosis or edema.  DP +1 Neuro: Alert and oriented X 3. Psych:  Good affect, responds appropriately   LABS: Basic Metabolic Panel:  Recent Labs  40/37/09 1130 07/13/14 0543  NA 142 144  K 5.5* 4.9  CL 112 113*  CO2 21 19  GLUCOSE 91 77  BUN 100* 95*  CREATININE 3.71* 3.43*  CALCIUM 8.5 8.6   Liver Function Tests: No results for input(s): AST, ALT, ALKPHOS, BILITOT, PROT, ALBUMIN in the last 72 hours. No results for input(s): LIPASE, AMYLASE in the last 72 hours. CBC:  Recent Labs  07/12/14 1130 07/13/14 0543  WBC 5.0 4.8  HGB 11.4* 11.1*  HCT 36.1* 35.4*  MCV 99.2 98.9  PLT 83* 81*   Cardiac Enzymes: No results for input(s): CKTOTAL, CKMB, CKMBINDEX, TROPONINI in the last 72 hours. BNP: Invalid input(s): POCBNP D-Dimer: No results for input(s): DDIMER in the last 72 hours. Hemoglobin A1C: No results for input(s): HGBA1C in the last 72 hours. Fasting Lipid Panel: No results for input(s): CHOL, HDL,  LDLCALC, TRIG, CHOLHDL, LDLDIRECT in the last 72 hours. Thyroid Function Tests: No results for input(s): TSH, T4TOTAL, T3FREE, THYROIDAB in the last 72 hours.  Invalid input(s): FREET3 Anemia Panel: No results for input(s): VITAMINB12, FOLATE, FERRITIN, TIBC, IRON, RETICCTPCT in the last 72 hours. Coag Panel:   Lab Results  Component Value Date   INR 1.41 07/12/2014   INR 2.86* 07/04/2014   INR 4.64* 07/03/2014    RADIOLOGY: Ct Abdomen Pelvis Wo Contrast  07/03/2014   CLINICAL DATA:  Multiple change out sub nephrostomy tube. Renal failure. Renal calculi. Infrarenal aortic aneurysm.  EXAM: CT ABDOMEN AND PELVIS WITHOUT CONTRAST  TECHNIQUE: Multidetector CT imaging of the abdomen and pelvis was performed following the standard protocol without IV contrast.  COMPARISON:  06/13/2013  FINDINGS: Lower chest: Marked cardiomegaly with extensive coronary artery atherosclerosis. Aortic valve calcification.  Large right pleural effusion with passive atelectasis. Mild airway thickening in the left lower lobe.  Hepatobiliary: Stable hypodense lesion in the dome of segment 2 of the liver, proximally 9 mm in diameter on image 16 series 2. Cholecystectomy.  Pancreas: Unremarkable  Spleen: Unremarkable  Adrenals/Urinary Tract: Thin, atrophic right renal parenchyma. Right nephrostomy tube in place coiled in the lower pole collecting system, with a small amount of  gas in the collecting system but without overt hydronephrosis. Fluid stranding in the perirenal spaces and renal hilar spaces appears commensurate with the generalized mesenteric, omental, and subcutaneous edema indicating third spacing of fluid. A 3 mm right UPJ calculus is present, image 40 series 2. Vascular calcifications are present along the right renal hilum.  Scattered exophytic lesions of both kidneys of varying complexity are again observed common non having demonstrated worrisome increase in size since the exam from 1 year ago.  On the left, there  is hydronephrosis and a flaccid and distended renal collecting system along with several proximal ureteral calculi including a 3 mm calculus on image 49 of series 2 and a 4 mm calculus on image 52 of series 2. The ureter is difficult to follow due to the surrounding fluid. Urinary bladder unremarkable.  Stomach/Bowel: Descending and sigmoid colon diverticulosis.  Vascular/Lymphatic: Extensive atherosclerotic calcification.  Fusiform infrarenal abdominal aortic aneurysm, 5.6 cm anterior - posterior by 5.4 cm transverse (formerly 5.2 by 5.4 cm). The aneurysm extends into the common iliac vessels which are both dilated.  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  Reproductive: Unremarkable  Other: Diffuse subcutaneous, mesenteric and, and omental edema compatible with third spacing of fluid. Large right pleural effusion and scattered ascites along with presacral edema and bilateral hydroceles extending into the fat containing inguinal hernias. The right inguinal hernia contains a loop of small bowel without findings of strangulation or obstruction. There is subcutaneous edema along the pubis.  Musculoskeletal: Bridging spurring of both sacroiliac joints.  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 There is grade 1 retrolisthesis at L3-4 and grade 1 anterolisthesis at L4-5.  Healing left ninth and tenth distal rib fractures noted.  IMPRESSION: 1. No right hydronephrosis. Abnormal atrophic thinning of both kidneys with scattered complex cysts of both kidneys. The right nephrostomy tube coils in the right kidney lower pole collecting system. 2. Left hydronephrosis  with dilated/flaccid collecting system and several proximal ureteral calculi. There is also a small right kidney UPJ stone. 3. Extensive third spacing of fluid including ascites, large right pleural effusion, and subcutaneous, mesenteric, and omental edema. 4. Mild enlargement of the infrarenal abdominal aortic aneurysm. 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 5. Descending and sigmoid colon diverticulosis without active diverticulitis. 6. Severe lumbar spondylosis and degenerative disc disease with multilevel impingement. 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 7. The right inguinal hernia contains a loop of small bowel without strangulation or obstruction. 8. Healing left ninth and tenth rib fractures.   Electronically Signed   By: Gaylyn Rong M.D.   On: 07/03/2014 18:34   Dg Chest 1 View  07/12/2014   CLINICAL DATA:  Congestive Heart Failure; congested cough; no other complaints from pt  EXAM: CHEST  1 VIEW  COMPARISON:  07/12/2014 at 11:36 a.m.  FINDINGS: Moderate 2 large right-sided pleural effusion is without significant change from the prior exam allowing for the semi-erect positioning on the current study. Associated right lung base opacity, likely atelectasis, is also stable. No convincing pulmonary edema. No left pleural effusion.  Stable cardiomegaly.  No pneumothorax.  IMPRESSION: No convincing change from the radiograph obtained earlier  the same date. Moderate to large right pleural effusion with associated right lung base opacity, most likely atelectasis. No convincing pulmonary edema.   Electronically Signed   By: Amie Portland M.D.   On: 07/12/2014 21:50   Dg Chest 2 View  07/12/2014   CLINICAL DATA:  Irregular heartbeat  EXAM: CHEST  2 VIEW  COMPARISON:  07/03/2014  FINDINGS: Persistent changes in the right lung base are noted consistent with effusion and underlying atelectasis/ infiltrate. Postsurgical changes are again seen. The cardiac shadow is enlarged. The left  lung remains clear.  IMPRESSION: Persistent right-sided effusion and basilar atelectasis/infiltrate. The overall appearance is stable from the prior exam.   Electronically Signed   By: Alcide Clever M.D.   On: 07/12/2014 14:13   Dg Chest Portable 1 View  07/03/2014   CLINICAL DATA:  Fatigue since January, 2016.  EXAM: PORTABLE CHEST - 1 VIEW  COMPARISON:  PA and lateral chest 04/09/2014 and 06/06/2013.  FINDINGS: Small right effusion and basilar airspace disease have worsened since the most recent examination. No left pleural effusion is identified. There is cardiomegaly without edema.  IMPRESSION: Worsened right effusion and basilar airspace disease.   Electronically Signed   By: Drusilla Kanner M.D.   On: 07/03/2014 12:29    Impression and Recommendations  1.   Atrial fibrillation, chronic and rated controlled on BB   2.   Acute on chronic renal failure  Renal function improved with diuresis.   3.  Thrombocytopenia  4.  Systolic CHF, chronic He does have a moderate to large right pleural effusion. He is slowly diuresing at this point on PO Lasix and is net neg 1.7L.  He had been having increased SOB at home and was set up for outpt echo which has not been done.  Will go ahead and assess with echo today.  5.  HTN (hypertension) - controlled and on the soft side  6.  Kidney stones  Hydronephrosis  Hopefully this can be managed by urology. He is a complex patient.  7.  Warfarin anticoagulation  The patient has been on Coumadin for his atrial fibrillation. This has been put on hold for this admission for his urologic procedures.  His Coumadin can be restarted after his urologic procedures.     Quintella Reichert, MD  07/14/2014  8:51 AM

## 2014-07-14 NOTE — Progress Notes (Signed)
  Echocardiogram 2D Echocardiogram has been performed.  Aris Everts 07/14/2014, 12:13 PM

## 2014-07-14 NOTE — Progress Notes (Addendum)
Patient ID: [name], male   DOB: Sep 07, 1925, 81 y.o.   MRN: [id number]    Subjective: Pt without new complaints.  He has been evaluated by IM and Cardiology.  Objective: Vital signs in last 24 hours: Temp:  [97.5 F (36.4 C)-98 F (36.7 C)] 97.7 F (36.5 C) (03/20 0502) Pulse Rate:  [76-95] 76 (03/20 0944) Resp:  [18-20] 20 (03/20 0944) BP: (93-102)/(56-64) 99/61 mmHg (03/20 0944) SpO2:  [94 %-98 %] 95 % (03/20 0944) Weight:  [86.093 kg (189 lb 12.8 oz)] 86.093 kg (189 lb 12.8 oz) (03/20 0502)  Intake/Output from previous day: 03/19 0701 - 03/20 0700 In: 200 [P.O.:150; IV Piggyback:50] Out: 1575 [Urine:1575] Intake/Output this shift:    Physical Exam:  General: Alert and oriented Abd: R nephrostomy tube in place draining.  Lab Results:  Recent Labs  07/12/14 1130 07/13/14 0543  HGB 11.4* 11.1*  HCT 36.1* 35.4*   Lab Results  Component Value Date   WBC 4.8 07/13/2014   HGB 11.1* 07/13/2014   HCT 35.4* 07/13/2014   MCV 98.9 07/13/2014   PLT 81* 07/13/2014     BMET  Recent Labs  07/12/14 1130 07/13/14 0543  NA 142 144  K 5.5* 4.9  CL 112 113*  CO2 21 19  GLUCOSE 91 77  BUN 100* 95*  CREATININE 3.71* 3.43*  CALCIUM 8.5 8.6     Studies/Results: Dg Chest 1 View  07/12/2014   CLINICAL DATA:  Congestive Heart Failure; congested cough; no other complaints from pt  EXAM: CHEST  1 VIEW  COMPARISON:  07/12/2014 at 11:36 a.m.  FINDINGS: Moderate 2 large right-sided pleural effusion is without significant change from the prior exam allowing for the semi-erect positioning on the current study. Associated right lung base opacity, likely atelectasis, is also stable. No convincing pulmonary edema. No left pleural effusion.  Stable cardiomegaly.  No pneumothorax.  IMPRESSION: No convincing change from the radiograph obtained earlier the same date. Moderate to large right pleural effusion with associated right lung base opacity, most likely atelectasis. No convincing  pulmonary edema.   Electronically Signed   By: Amie Portland M.D.   On: 07/12/2014 21:50   Dg Chest 2 View  07/12/2014   CLINICAL DATA:  Irregular heartbeat  EXAM: CHEST  2 VIEW  COMPARISON:  07/03/2014  FINDINGS: Persistent changes in the right lung base are noted consistent with effusion and underlying atelectasis/ infiltrate. Postsurgical changes are again seen. The cardiac shadow is enlarged. The left lung remains clear.  IMPRESSION: Persistent right-sided effusion and basilar atelectasis/infiltrate. The overall appearance is stable from the prior exam.   Electronically Signed   By: Alcide Clever M.D.   On: 07/12/2014 14:13    Assessment/Plan: 1) Left ureteral stone with obstruction: Appreciate IM and Cardiology care. Echocardiogram pending today. Will await final recommendations if patient felt to be stable for ureteroscopic intervention tomorrow for left ureteral stone but will plan to make patient NPO after midnight and will keep patient on the schedule for Dr. [name] at 11:30 tomorrow morning if medically cleared.   LOS: 2 days   Destanee Bedonie,LES 07/14/2014, 10:09 AM

## 2014-07-14 NOTE — Progress Notes (Signed)
[facility] PROGRESS NOTE  EDRICK COST XVQ:[id number] DOB: 06-08-1925 DOA: 07/12/2014 PCP: [name], MD  Assessment/Plan: 1. Preoperative clearance. -Patient with multiple comorbidities including chronic atrial fibrillation and cardiomyopathy. -His chest x-ray was negative for pulmonary edema and infiltrate -Treating urinary tract infection -Awaiting 2D echo results   2.  Chronic atrial fibrillation. -I held antihypertensives on 07/12/2014 because of hypotension. After discussion with cardiology have added back metoprolol at 12.5 mg by mouth twice a day -Anticoagulation has been held for upcoming procedure -His systolic blood pressures remain in the 90's, will continue to monitor  3.   Urinary tract infection . -Urinalysis revealed the presence of many bacteria and white blood cells -Started ceftriaxone 1 g IV every 24 hours  4.   History of chronic systolic congestive heart failure -Pending transthoracic echocardiogram, last echo was performed in 2009. Continue Lasix 40 mg by mouth daily -Awaiting 2-D echo  5.  Acute on chronic kidney failure -He recently presented to outside hospital having creatinine of 5.32 and BUN 113, likely due to postobstructive nephropathy. -Creatinine continues to trend down to 3.43 -Will check BMP in AM  Code Status: DO NOT RESUSCITATE Family Communication:  Disposition Plan:   Consultants:  Urology  Cardiology   Antibiotics:  Ceftriaxone  HPI/Subjective: Patient is a pleasant 81 year old gentleman with a past medical history of chronic atrial fibrillation, previously on anticoagulation, stage III chronic unit disease, admitted to the medicine service on 07/12/2014. He was recently admitted to [facility] on 07/03/2014 presented with likely postobstructive uropathy, unfortunately left AGAINST MEDICAL ADVICE on 07/04/2014. CT scan of abdomen and pelvis performed on 07/02/2004 and revealed left hydronephrosis with dilated  collecting system and proximal ureteral calculi. Status post right nephrostomy tube placement. His urologist Dr. [name] requesting medicine to admit as patient will likely undergo ureteroscopy on Monday. Patient has multiple medical conditions, and requested preoperative clearance.  Objective: Filed Vitals:   07/14/14 0944  BP: 99/61  Pulse: 76  Temp:   Resp: 20    Intake/Output Summary (Last 24 hours) at 07/14/14 1221 Last data filed at 07/14/14 1058  Gross per 24 hour  Intake    100 ml  Output   1425 ml  Net  -1325 ml   Filed Weights   07/12/14 1630 07/14/14 0502  Weight: 85.73 kg (189 lb) 86.093 kg (189 lb 12.8 oz)    Exam:   General:  Patient is awake and alert, has no complaints  Cardiovascular: Irregular rate and rhythm normal S1-S2 no murmurs rubs or gallops, 2+ bilateral extremity pitting edema  Respiratory: Normal respiratory effort, lungs overall clear to auscultation bilaterally  Abdomen: Soft nontender nondistended positive bowel sounds  Musculoskeletal: +2+ bilateral extremity pitting edema  Data Reviewed: Basic Metabolic Panel:  Recent Labs Lab 07/12/14 1130 07/13/14 0543  NA 142 144  K 5.5* 4.9  CL 112 113*  CO2 21 19  GLUCOSE 91 77  BUN 100* 95*  CREATININE 3.71* 3.43*  CALCIUM 8.5 8.6   Liver Function Tests: No results for input(s): AST, ALT, ALKPHOS, BILITOT, PROT, ALBUMIN in the last 168 hours. No results for input(s): LIPASE, AMYLASE in the last 168 hours. No results for input(s): AMMONIA in the last 168 hours. CBC:  Recent Labs Lab 07/12/14 1130 07/13/14 0543  WBC 5.0 4.8  HGB 11.4* 11.1*  HCT 36.1* 35.4*  MCV 99.2 98.9  PLT 83* 81*   Cardiac Enzymes: No results for input(s): CKTOTAL, CKMB, CKMBINDEX, TROPONINI in the last 168 hours.  BNP (last 3 results)  Recent Labs  07/03/14 1200  BNP 740.0*    ProBNP (last 3 results)  Recent Labs  04/09/14 1038  PROBNP 9656.0*    CBG: No results for input(s): GLUCAP in the  last 168 hours.  No results found for this or any previous visit (from the past 240 hour(s)).   Studies: Dg Chest 1 View  07/12/2014   CLINICAL DATA:  Congestive Heart Failure; congested cough; no other complaints from pt  EXAM: CHEST  1 VIEW  COMPARISON:  07/12/2014 at 11:36 a.m.  FINDINGS: Moderate 2 large right-sided pleural effusion is without significant change from the prior exam allowing for the semi-erect positioning on the current study. Associated right lung base opacity, likely atelectasis, is also stable. No convincing pulmonary edema. No left pleural effusion.  Stable cardiomegaly.  No pneumothorax.  IMPRESSION: No convincing change from the radiograph obtained earlier the same date. Moderate to large right pleural effusion with associated right lung base opacity, most likely atelectasis. No convincing pulmonary edema.   Electronically Signed   By: Amie Portland M.D.   On: 07/12/2014 21:50    Scheduled Meds: . cefTRIAXone (ROCEPHIN)  IV  1 g Intravenous Q24H  . furosemide  40 mg Oral Daily  . latanoprost  1 drop Both Eyes QHS  . metoprolol tartrate  12.5 mg Oral BID  . simvastatin  20 mg Oral q1800  . sodium chloride  3 mL Intravenous Q12H  . sodium chloride  3 mL Intravenous Q12H   Continuous Infusions:   Active Problems:   Atrial fibrillation, chronic   Acute on chronic renal failure   Thrombocytopenia   Systolic CHF, chronic   HTN (hypertension)   Kidney stones   Hydronephrosis   Hyperkalemia   Renal failure   AKI (acute kidney injury)   Warfarin anticoagulation    Time spent: 25 minutes    Jeralyn Bennett  [facility] Pager [phone]. If 7PM-7AM, please contact night-coverage at [email or web address], password [facility] 07/14/2014, 12:21 PM  LOS: 2 days

## 2014-07-15 ENCOUNTER — Encounter: Payer: Self-pay | Admitting: *Deleted

## 2014-07-15 ENCOUNTER — Encounter (HOSPITAL_COMMUNITY)
Admission: EM | Disposition: A | Discharge status: Home / Self Care | Payer: Self-pay | Source: Home / Self Care | Attending: Internal Medicine

## 2014-07-15 ENCOUNTER — Ambulatory Visit (HOSPITAL_COMMUNITY): Admission: RE | Admit: 2014-07-15 | Payer: Medicare Other | Source: Ambulatory Visit | Admitting: Urology

## 2014-07-15 ENCOUNTER — Encounter: Payer: Medicare Other | Admitting: Adult Health

## 2014-07-15 ENCOUNTER — Inpatient Hospital Stay (HOSPITAL_COMMUNITY): Payer: Medicare Other | Admitting: Anesthesiology

## 2014-07-15 ENCOUNTER — Encounter: Payer: Self-pay | Admitting: Adult Health

## 2014-07-15 ENCOUNTER — Encounter (HOSPITAL_COMMUNITY): Payer: Self-pay | Admitting: Anesthesiology

## 2014-07-15 DIAGNOSIS — I5022 Chronic systolic (congestive) heart failure: Secondary | ICD-10-CM | POA: Insufficient documentation

## 2014-07-15 DIAGNOSIS — N131 Hydronephrosis with ureteral stricture, not elsewhere classified: Secondary | ICD-10-CM

## 2014-07-15 HISTORY — PX: CYSTOSCOPY WITH RETROGRADE PYELOGRAM, URETEROSCOPY AND STENT PLACEMENT: SHX5789

## 2014-07-15 LAB — SURGICAL PCR SCREEN
MRSA, PCR: NEGATIVE
STAPHYLOCOCCUS AUREUS: NEGATIVE

## 2014-07-15 LAB — BASIC METABOLIC PANEL
Anion gap: 10 (ref 5–15)
BUN: 78 mg/dL — ABNORMAL HIGH (ref 6–23)
CO2: 19 mmol/L (ref 19–32)
Calcium: 8.3 mg/dL — ABNORMAL LOW (ref 8.4–10.5)
Chloride: 110 mmol/L (ref 96–112)
Creatinine, Ser: 2.84 mg/dL — ABNORMAL HIGH (ref 0.50–1.35)
GFR calc Af Amer: 22 mL/min — ABNORMAL LOW (ref >90)
GFR calc non Af Amer: 19 mL/min — ABNORMAL LOW (ref >90)
Glucose, Bld: 87 mg/dL (ref 70–99)
POTASSIUM: 4.9 mmol/L (ref 3.5–5.1)
SODIUM: 139 mmol/L (ref 135–145)

## 2014-07-15 LAB — PROTIME-INR
INR: 1.37 (ref 0.00–1.49)
PROTHROMBIN TIME: 17 s — AB (ref 11.6–15.2)

## 2014-07-15 LAB — CBC
HEMATOCRIT: 33.8 % — AB (ref 39.0–52.0)
HEMOGLOBIN: 10.8 g/dL — AB (ref 13.0–17.0)
MCH: 31.7 pg (ref 26.0–34.0)
MCHC: 32 g/dL (ref 30.0–36.0)
MCV: 99.1 fL (ref 78.0–100.0)
Platelets: 75 10*3/uL — ABNORMAL LOW (ref 150–400)
RBC: 3.41 MIL/uL — AB (ref 4.22–5.81)
RDW: 18.3 % — ABNORMAL HIGH (ref 11.5–15.5)
WBC: 3.6 10*3/uL — ABNORMAL LOW (ref 4.0–10.5)

## 2014-07-15 SURGERY — CYSTOURETEROSCOPY, WITH RETROGRADE PYELOGRAM AND STENT INSERTION
Anesthesia: General | Laterality: Left

## 2014-07-15 MED ORDER — PROPOFOL 10 MG/ML IV BOLUS
INTRAVENOUS | Status: DC | PRN
  Administered 2014-07-15: 100 mg via INTRAVENOUS

## 2014-07-15 MED ORDER — EPINEPHRINE HCL 0.1 MG/ML IJ SOSY
PREFILLED_SYRINGE | INTRAMUSCULAR | Status: DC | PRN
  Administered 2014-07-15 (×2): 50 ug via INTRAVENOUS
  Administered 2014-07-15: 40 ug via INTRAVENOUS
  Administered 2014-07-15 (×2): 30 ug via INTRAVENOUS
  Administered 2014-07-15: 50 ug via INTRAVENOUS

## 2014-07-15 MED ORDER — EPHEDRINE SULFATE 50 MG/ML IJ SOLN
INTRAMUSCULAR | Status: DC | PRN
  Administered 2014-07-15: 15 mg via INTRAVENOUS

## 2014-07-15 MED ORDER — WARFARIN - PHARMACIST DOSING INPATIENT: Freq: Every day | Status: DC

## 2014-07-15 MED ORDER — ETOMIDATE 2 MG/ML IV SOLN
INTRAVENOUS | Status: DC | PRN
  Administered 2014-07-15: 10 mg via INTRAVENOUS

## 2014-07-15 MED ORDER — FENTANYL CITRATE 0.05 MG/ML IJ SOLN
INTRAMUSCULAR | Status: DC | PRN
Start: 2014-07-15 — End: 2014-07-15
  Administered 2014-07-15: 50 ug via INTRAVENOUS

## 2014-07-15 MED ORDER — FENTANYL CITRATE 0.05 MG/ML IJ SOLN: 25.0000 ug | INTRAMUSCULAR | Status: DC | PRN

## 2014-07-15 MED ORDER — LACTATED RINGERS IV SOLN: INTRAVENOUS | Status: DC

## 2014-07-15 MED ORDER — WARFARIN SODIUM 2.5 MG PO TABS
2.5000 mg | ORAL_TABLET | Freq: Once | ORAL | Status: AC
  Administered 2014-07-15: 2.5 mg via ORAL
  Filled 2014-07-15: qty 1

## 2014-07-15 MED ORDER — SODIUM CHLORIDE 0.9 % IR SOLN
Status: DC | PRN
  Administered 2014-07-15: 4000 mL

## 2014-07-15 MED ORDER — LACTATED RINGERS IV SOLN
INTRAVENOUS | Status: DC | PRN
  Administered 2014-07-15: 11:00:00 via INTRAVENOUS

## 2014-07-15 MED ORDER — PHENYLEPHRINE HCL 10 MG/ML IJ SOLN
INTRAMUSCULAR | Status: DC | PRN
  Administered 2014-07-15 (×2): 120 ug via INTRAVENOUS
  Administered 2014-07-15: 160 ug via INTRAVENOUS

## 2014-07-15 SURGICAL SUPPLY — 20 items
BAG URO CATCHER STRL LF (DRAPE) ×2 IMPLANT
BASKET ZERO TIP NITINOL 2.4FR (BASKET) IMPLANT
BSKT STON RTRVL ZERO TP 2.4FR (BASKET)
CATH INTERMIT  6FR 70CM (CATHETERS) ×2 IMPLANT
CLOTH BEACON ORANGE TIMEOUT ST (SAFETY) ×2 IMPLANT
FIBER LASER FLEXIVA 1000 (UROLOGICAL SUPPLIES) IMPLANT
FIBER LASER FLEXIVA 200 (UROLOGICAL SUPPLIES) IMPLANT
FIBER LASER FLEXIVA 365 (UROLOGICAL SUPPLIES) IMPLANT
FIBER LASER FLEXIVA 550 (UROLOGICAL SUPPLIES) IMPLANT
FIBER LASER TRAC TIP (UROLOGICAL SUPPLIES) IMPLANT
GLOVE BIOGEL M 8.0 STRL (GLOVE) ×2 IMPLANT
GOWN STRL REUS W/ TWL XL LVL3 (GOWN DISPOSABLE) ×1 IMPLANT
GOWN STRL REUS W/TWL XL LVL3 (GOWN DISPOSABLE) ×4 IMPLANT
GUIDEWIRE ANG ZIPWIRE 038X150 (WIRE) IMPLANT
GUIDEWIRE STR DUAL SENSOR (WIRE) IMPLANT
MANIFOLD NEPTUNE II (INSTRUMENTS) ×2 IMPLANT
PACK CYSTO (CUSTOM PROCEDURE TRAY) ×2 IMPLANT
SHEATH ACCESS URETERAL 38CM (SHEATH) ×1 IMPLANT
SHIELD EYE BINOCULAR (MISCELLANEOUS) IMPLANT
TUBING CONNECTING 10 (TUBING) ×2 IMPLANT

## 2014-07-15 NOTE — Anesthesia Postprocedure Evaluation (Signed)
  Anesthesia Post-op Note  Patient: [name]  Procedure(s) Performed: Procedure(s) (LRB): LEFT URETEROSCOPY RETROGRADE PYELOGRAM TRANSECTION OF BLADDER TUMOR WITH BLADDER LESION BIOPSIES (Left)  Patient Location: PACU  Anesthesia Type: General  Level of Consciousness: awake and alert   Airway and Oxygen Therapy: Patient Spontanous Breathing  Post-op Pain: mild  Post-op Assessment: Post-op Vital signs reviewed, Patient's Cardiovascular Status Stable, Respiratory Function Stable, Patent Airway and No signs of Nausea or vomiting  Last Vitals:  Filed Vitals:   07/15/14 1320  BP: 106/70  Pulse: 85  Temp: 36.4 C  Resp: 14    Post-op Vital Signs: stable   Complications: No apparent anesthesia complications. Heart rate somewhat higher than preop. Level, but he has no complaints, no SOB, no significant pain. He required pressors intraoperatively and this relative tachycardia is probably from residual pressor circulating. Prefer to not treat elevated HR at this time and it should resolve. To telemetry.

## 2014-07-15 NOTE — Op Note (Signed)
PATIENT:  [name]  PRE-OPERATIVE DIAGNOSIS: Left ureteral calculi  POST-OPERATIVE DIAGNOSIS: 1. History of left ureteral calculi 2. Bladder tumors  PROCEDURE: 1. Cystoscopy with left retrograde pyelogram including interpretation. 2. Left ureteroscopy 3. Bladder biopsy and fulguration of bladder lesions.  SURGEON:  Garnett Farm  INDICATION: [name] is a 81 year old male who was recently admitted to the hospital with worsening renal function and what appeared to be 2 stones in his left ureter with dilation however he has a history of marked chronic dilation of both renal pelvis and ureters from long-standing ureteral stones that were treated previously. His creatinine has progressively improved. He has no history of bladder cancer. He is brought to the operating room today for ureteroscopic management of his left ureteral stones.  ANESTHESIA:  General  EBL:  Minimal  DRAINS: None  LOCAL MEDICATIONS USED:  None  SPECIMEN:  Biopsies from the posterior wall of the bladder and from the floor of the bladder on the right-hand side.  Description of procedure: After informed consent the patient was taken to the operating room and placed on the table in a supine position. General anesthesia was then administered. Once fully anesthetized the patient was moved to the dorsal lithotomy position and the genitalia were sterilely prepped and draped in standard fashion. An official timeout was then performed.  The 23 French cystoscope with 30 lens was then passed under direct vision down the urethra which was noted be normal. Prostatic urethra revealed bilobar hypertrophy with some obstruction and a mild median lobe. The bladder was then entered and noted to have 4+ trabeculation. The ureteral orifices could not be identified with either the 30 or 70 lenses however I was able to see peristalsis from an area on the posterior side of a hypertrophied trigonal ridge. I also noted on the posterior  wall of the bladder a small papillary lesion as well as a group papillary lesions on the floor of the bladder on the right-hand side posterior to the right ureteral orifice.  The flexible cystoscope was then inserted and I was able to use the flexible scope to see behind the hypertrophy trigonal ridge and passed a 0.038 inch floppy-tipped guidewire through the scope and into the left ureteral orifice. I advanced this several centimeters up the left ureter under fluoroscopy and left this in place and removed the flexible cystoscope and passed a 6 [place] open-ended ureteral catheter over the guidewire and into the left ureteral orifice in preparation for his left retrograde pyelogram.  Full-strength iodinated contrast was then injected under fluoroscopy up the left ureter which revealed some tortuosity. I saw no real filling defect although there was an area where the previously noted stone on CT scan was located. This was at the upper border of the L4 vertebral body. Beyond this the ureter did not appear abnormal. The renal pelvis was markedly dilated. A 0.038 inch floppy-tipped guidewire was then passed through the open ended catheter and into the area of the renal pelvis under fluoroscopy and left in place. Over the guidewire I passed the inner portion of a ureteral access sheath initially to dilate the intramural ureter and this passed very easily so I placed another ureteral access sheath up the left ureter and removed the inner portion as well as guidewire and performed ureteroscopy.  The flexible, digital ureteroscope was passed through the access sheath and into the ureter beyond the end of the access sheath. I was able to advance this under direct vision  up to the point seen that corresponded to the location of the stone on his CT scan. What I found was a calcification beneath the ureteral mucosa but this was completely covered with ureteral mucosa and was not obstructing. Beyond this I could see normal  appearing ureter that widened out but I couldn't advance the scope beyond this location. I therefore injected more contrast with the scope at that location. The contrast was injected through the ureteroscope and it confirmed the fact that there were no filling defects proximal to this location. I therefore backed the ureteroscope down the ureter under direct vision to be sure there were no stones in the ureter and none were identified. No stent was left.  The cystoscope was reinserted and the Tauber biopsy forceps were passed into the bladder and I obtained 2 biopsies from the lesions on the posterior wall and 3 biopsies from the lesion on the floor of the bladder on the right-hand side. I used the biopsy forceps to cauterize the location and the mucosa surrounding where the lesions were biopsied and was able to completely eradicate all visible tumor. I reinspected the areas using the 30 lens and again noted no other tumors. It appeared all tumor was adequately treated. I therefore drained the bladder and removed the cystoscope. The patient was awakened and taken to the recovery room in stable and satisfactory condition. He tolerated the procedure well no intraoperative complications.  PLAN OF CARE: Discharge to home after PACU  PATIENT DISPOSITION:  PACU - hemodynamically stable.

## 2014-07-15 NOTE — Transfer of Care (Signed)
Immediate Anesthesia Transfer of Care Note  Patient: [name]  Procedure(s) Performed: Procedure(s): LEFT URETEROSCOPY RETROGRADE PYELOGRAM TRANSECTION OF BLADDER TUMOR WITH BLADDER LESION BIOPSIES (Left)  Patient Location: PACU  Anesthesia Type:General  Level of Consciousness: awake, sedated and patient cooperative  Airway & Oxygen Therapy: Patient Spontanous Breathing and Patient connected to face mask oxygen  Post-op Assessment: Report given to RN and Post -op Vital signs reviewed and stable  Post vital signs: Reviewed and stable  Last Vitals:  Filed Vitals:   07/15/14 0905  BP: 104/82  Pulse: 104  Temp:   Resp: 18    Complications: No apparent anesthesia complications

## 2014-07-15 NOTE — Progress Notes (Signed)
ANTICOAGULATION CONSULT NOTE - Initial Consult  Pharmacy Consult for Warfarin Indication: atrial fibrillation  Allergies  Allergen Reactions  . Sulfa Antibiotics Rash    Nausea     Patient Measurements: Height: 5\' 10"  (177.8 cm) Weight: 190 lb 1.6 oz (86.229 kg) IBW/kg (Calculated) : 73  Vital Signs: Temp: 97.6 F (36.4 C) (03/21 1320) Temp Source: Oral (03/21 0427) BP: 106/70 mmHg (03/21 1320) Pulse Rate: 85 (03/21 1320)  Labs:  Recent Labs  07/13/14 0543 07/15/14 0550  HGB 11.1* 10.8*  HCT 35.4* 33.8*  PLT 81* 75*  LABPROT  --  17.0*  INR  --  1.37  CREATININE 3.43* 2.84*    Estimated Creatinine Clearance: 19.3 mL/min (by C-G formula based on Cr of 2.84).   Medical History: Past Medical History  Diagnosis Date  . Hyperlipidemia   . White coat hypertension   . Cardiomyopathy      EF:30-40% in 06/1998, but 50-55% in 2009;  HX ISCHEMIC CARDIOMYOPATHY  . Chronic atrial fibrillation   . Vitamin B12 deficiency   . Chronic anticoagulation   . Arthritis   . Sensory neuropathy   . Chronic kidney disease, stage III (moderate)     creatinine 1.5 2001, 2.0 2009, 2.28 in 12/2009  . Mild renal insufficiency   . Hydronephrosis, bilateral   . Ureteral calculi     BILATERAL  . History of CHF (congestive heart failure)     1999  &  2005  . History of kidney stones   . Bilateral renal cysts   . 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 Chronic venous insufficiency   . PAD (peripheral artery disease)   . Edema of both legs   . History of gout     per pt stable  . Glaucoma of both eyes   . Wears hearing aid     bilateral  . CHF (congestive heart failure)   . Shortness of breath dyspnea   . Arrhythmia     Afib CHADS VASC Score of 5.    Assessment: [name] with PMH of chronic a-fib, CKD stage III, bilateral hydronephrosis, bilateral ureteral calculi,  CHF who is s/p cystoscopy with left retrograde pyelogram today.  Warfarin has been on hold and is now being resumed post-op.  Cardiology does not recommend Lovenox bridge. Pharmacy consulted to assist with warfarin dosing while patient in the hospital.  Home dose as of 3/7 Anticoag visit notes: 2.5 mg daily except NONE on Wednesdays  Today, 07/15/2014:  INR 1.37  Hgb 10.8, stable; Pltc low at 75, relatively stable over past 2 weeks  Diet: Heart healthy  Drug interactions: CTX may reduce warfarin needs, but typically not significant DDI   Goal of Therapy:  INR 2-3 Monitor platelets by anticoagulation protocol: Yes   Plan:   Warfarin 2.5 mg PO x 1 tonight.  Daily PT/INR.  Monitor for signs/symptoms of bleeding.   Greer Pickerel, PharmD, BCPS Pager: [phone] 07/15/2014 3:29 PM

## 2014-07-15 NOTE — Progress Notes (Signed)
Patient ID: [name], male   DOB: [birth year], 81 y.o.   MRN: [id number]   SUBJECTIVE:  Doesn;t like being helped to bathroom  OR latter today   OBJECTIVE:   Vitals:   Filed Vitals:   07/14/14 0944 07/14/14 1425 07/14/14 2033 07/15/14 0427  BP: 99/61 90/52 110/66 92/52  Pulse: 76 109 83 106  Temp:  98 F (36.7 C) 98.2 F (36.8 C) 97.9 F (36.6 C)  TempSrc:  Oral Oral Oral  Resp: Height:      Weight:    190 lb 1.6 oz (86.229 kg)  SpO2: 95% 96% 96% 94%   I&O's:    Intake/Output Summary (Last 24 hours) at 07/15/14 0858 Last data filed at 07/15/14 0434  Gross per 24 hour  Intake    290 ml  Output   1275 ml  Net   -985 ml   TELEMETRY: Reviewed telemetry pt in atrial fibrillation at 80-90 bpm: 07/15/2014      PHYSICAL EXAM General: Well developed, well nourished, in no acute distress Head: Eyes PERRLA, No xanthomas.   Normal cephalic and atramatic  Lungs:  Decreased BS at right base  Heart:   Irregularly irregular S1 S2 Pulses are 2+ & equal. Abdomen: Bowel sounds are positive, abdomen soft and non-tender without masses  Extremities:   No clubbing, cyanosis or edema.  DP +1 Neuro: Alert and oriented X 3. Psych:  Good affect, responds appropriately   LABS: Basic Metabolic Panel:  Recent Labs  69/62/95 0543 07/15/14 0550  NA 144 139  K 4.9 4.9  CL 113* 110  CO2 19 19  GLUCOSE 77 87  BUN 95* 78*  CREATININE 3.43* 2.84*  CALCIUM 8.6 8.3*   CBC:  Recent Labs  07/13/14 0543 07/15/14 0550  WBC 4.8 3.6*  HGB 11.1* 10.8*  HCT 35.4* 33.8*  MCV 98.9 99.1  PLT 81* 75*   Coag Panel:   Lab Results  Component Value Date   INR 1.37 07/15/2014   INR 1.41 07/12/2014   INR 2.86* 07/04/2014    RADIOLOGY: Ct Abdomen Pelvis Wo Contrast  07/03/2014   CLINICAL DATA:  Multiple change out sub nephrostomy tube. Renal failure. Renal calculi. Infrarenal aortic aneurysm.  EXAM: CT ABDOMEN AND PELVIS WITHOUT CONTRAST  TECHNIQUE: Multidetector CT imaging of  the abdomen and pelvis was performed following the standard protocol without IV contrast.  COMPARISON:  06/13/2013  FINDINGS: Lower chest: Marked cardiomegaly with extensive coronary artery atherosclerosis. Aortic valve calcification.  Large right pleural effusion with passive atelectasis. Mild airway thickening in the left lower lobe.  Hepatobiliary: Stable hypodense lesion in the dome of segment 2 of the liver, proximally 9 mm in diameter on image 16 series 2. Cholecystectomy.  Pancreas: Unremarkable  Spleen: Unremarkable  Adrenals/Urinary Tract: Thin, atrophic right renal parenchyma. Right nephrostomy tube in place coiled in the lower pole collecting system, with a small amount of gas in the collecting system but without overt hydronephrosis. Fluid stranding in the perirenal spaces and renal hilar spaces appears commensurate with the generalized mesenteric, omental, and subcutaneous edema indicating third spacing of fluid. A 3 mm right UPJ calculus is present, image 40 series 2. Vascular calcifications are present along the right renal hilum.  Scattered exophytic lesions of both kidneys of varying complexity are again observed common non having demonstrated worrisome increase in size since the exam from 1 year ago.  On the left, there is hydronephrosis and a flaccid and distended renal collecting  system along with several proximal ureteral calculi including a 3 mm calculus on image 49 of series 2 and a 4 mm calculus on image 52 of series 2. The ureter is difficult to follow due to the surrounding fluid. Urinary bladder unremarkable.  Stomach/Bowel: Descending and sigmoid colon diverticulosis.  Vascular/Lymphatic: Extensive atherosclerotic calcification.  Fusiform infrarenal abdominal aortic aneurysm, 5.6 cm anterior - posterior by 5.4 cm transverse (formerly 5.2 by 5.4 cm). The aneurysm extends into the common iliac vessels which are both dilated.  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  Reproductive: Unremarkable  Other: Diffuse subcutaneous, mesenteric and, and omental edema compatible with third spacing of fluid. Large right pleural effusion and scattered ascites along with presacral edema and bilateral hydroceles extending into the fat containing inguinal hernias. The right inguinal hernia contains a loop of small bowel without findings of strangulation or obstruction. There is subcutaneous edema along the pubis.  Musculoskeletal: Bridging spurring of both sacroiliac joints.  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 There is grade 1 retrolisthesis at L3-4 and grade 1 anterolisthesis at L4-5.  Healing left ninth and tenth distal rib fractures noted.  IMPRESSION: 1. No right hydronephrosis. Abnormal atrophic thinning of both kidneys with scattered complex cysts of both kidneys. The right nephrostomy tube coils in the right kidney lower pole collecting system. 2. Left hydronephrosis with dilated/flaccid collecting system and several proximal ureteral calculi. There is also a small right kidney UPJ stone. 3. Extensive third spacing of fluid including ascites, large right pleural effusion, and subcutaneous, mesenteric, and omental edema. 4. Mild enlargement of the infrarenal abdominal aortic aneurysm. 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 5. Descending and sigmoid colon diverticulosis without active diverticulitis. 6. Severe lumbar spondylosis and degenerative disc disease with multilevel impingement. 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 7. The right inguinal hernia contains a loop of small bowel without strangulation or obstruction. 8.  Healing left ninth and tenth rib fractures.   Electronically Signed   By: Gaylyn Rong M.D.   On: 07/03/2014 18:34   Dg Chest 1 View  07/12/2014   CLINICAL DATA:  Congestive Heart Failure; congested cough; no other complaints from pt  EXAM: CHEST  1 VIEW  COMPARISON:  07/12/2014 at 11:36 a.m.  FINDINGS: Moderate 2 large right-sided pleural effusion is without significant change from the prior exam allowing for the semi-erect positioning on the current study. Associated right lung base opacity, likely atelectasis, is also stable. No convincing pulmonary edema. No left pleural effusion.  Stable cardiomegaly.  No pneumothorax.  IMPRESSION: No convincing change from the radiograph obtained earlier the same date. Moderate to large right pleural effusion with associated right lung base opacity, most likely atelectasis. No convincing pulmonary edema.   Electronically Signed   By: Amie Portland M.D.   On: 07/12/2014 21:50   Dg Chest 2 View  07/12/2014   CLINICAL DATA:  Irregular heartbeat  EXAM: CHEST  2 VIEW  COMPARISON:  07/03/2014  FINDINGS: Persistent changes in the right lung base are noted consistent with effusion and underlying atelectasis/ infiltrate. Postsurgical changes are again seen. The cardiac shadow is enlarged. The left lung remains clear.  IMPRESSION: Persistent right-sided effusion and basilar atelectasis/infiltrate. The overall appearance is stable from the prior exam.   Electronically Signed   By: Alcide Clever M.D.   On: 07/12/2014 14:13   Dg Chest Portable 1 View  07/03/2014   CLINICAL DATA:  Fatigue since January, 2016.  EXAM: PORTABLE CHEST - 1 VIEW  COMPARISON:  PA and lateral chest 04/09/2014 and 06/06/2013.  FINDINGS: Small right effusion and basilar airspace disease have worsened since the most recent examination. No left pleural effusion is identified. There is cardiomegaly without edema.  IMPRESSION: Worsened right effusion and basilar airspace disease.   Electronically Signed    By: Drusilla Kanner M.D.   On: 07/03/2014 12:29    Impression and Recommendations  1.   Atrial fibrillation, chronic and rated controlled on BB   2.   Acute on chronic renal failure  Renal function improved with diuresis.   3.  Thrombocytopenia  4.  Systolic CHF, chronic Echo reviewed EF 30-35%  Has right pleural effusion continue diuretics  OK for OR today with Dr Phoebe Perch.  Consider LL decubitus film and thoracentesis   5.  HTN (hypertension) - controlled and on the soft side  6.  Kidney stones  Hydronephrosis  To OR this afternoon   7.  Warfarin anticoagulation  The patient has been on Coumadin for his atrial fibrillation. This has been put on hold for this admission for his urologic procedures.  His Coumadin can be restarted after his urologic procedures.   Does not need lovenox bridge    Charlton Haws, MD  07/15/2014  8:58 AM

## 2014-07-15 NOTE — Progress Notes (Signed)
Patient ID: [name], male   DOB: 1925-11-19, 81 y.o.   MRN: [id number]     Persistent right ureteral obstruction: After having his chronically impacted right ureteral stone treated and his stent removed he developed elevated creatinine and on ultrasound in 10/14 was found to have right hydronephrosis. The percutaneous nephrostomy tube was placed and several attempts have been made to internalize the tube but the interventional radiologist was unsuccessful at negotiating the obstruction. A nephrostogram also revealed no contrast passing beyond the area of obstruction.  Current treatment: Nephrostomy drainage.    Urinary retention: He developed retention and had a Foley catheter placed. He eventually voided spontaneously.    Interval history: He just had his right nephrostomy tube changed recently. He was admitted to [facility] with acute worsening of his renal function and a CT scan was obtained. It revealed his nephrostomy tube was in good position and stones within the left ureter with dilatation of the collecting system but it did not appear to be hydronephrotic and he has long-standing marked hydronephrosis and did not appear to have acute, high pressure dilatation of the collecting system. In addition his creatinine improved while he was in the hospital.   He tells me is not having any flank pain but is experiencing some discomfort in his shoulder and scapular region on the left hand side. He denies any hematuria    Assessment   I had a long discussion with him about the fact that he does appear to have stones in his left ureter once again. I was able to clear him previously of his left ureteral stones although it is quite a challenge due to the tortuosity of his ureter. I told him that I would be willing to attempt ureteroscopic management of the stones although with marked renal atrophy bilaterally and a stable creatinine the other options would be nephrostomy tube on the  left-hand side or no treatment at all.   For observation I described the risks which include but are not limited to silent renal damage, life-threatening infection, need for emergent surgery, failure to pass stone, and pain.    For ureteroscopy I described the risks which include heart attack, stroke, pulmonary embolus, death, bleeding, infection, damage to contiguous structures, positioning injury, ureteral stricture, ureteral avulsion, ureteral injury, need for ureteral stent, inability to perform ureteroscopy, need for an interval procedure, inability to clear stone burden, stent discomfort and pain.    He would like to proceed with ureteroscopy and management of his left ureteral calculi.

## 2014-07-15 NOTE — Progress Notes (Signed)
No Show

## 2014-07-15 NOTE — Progress Notes (Signed)
Pt returned from PACU on bed, A&Ox4. Denies pain/nausea. VSS. Pt states he is thirsty, ginger ale provided. Bed alarm on. Callbell in reach. No c/o at present.

## 2014-07-15 NOTE — Anesthesia Procedure Notes (Signed)
Procedure Name: LMA Insertion Date/Time: 07/15/2014 11:31 AM Performed by: Paulla Dolly A Pre-anesthesia Checklist: Patient identified, Emergency Drugs available, Suction available, Patient being monitored and Timeout performed Patient Re-evaluated:Patient Re-evaluated prior to inductionOxygen Delivery Method: Circle system utilized Preoxygenation: Pre-oxygenation with 100% oxygen Intubation Type: Combination inhalational/ intravenous induction Ventilation: Mask ventilation without difficulty LMA: LMA with gastric port inserted LMA Size: 5.0 Number of attempts: 1 Placement Confirmation: positive ETCO2 and breath sounds checked- equal and bilateral Tube secured with: Tape Dental Injury: Teeth and Oropharynx as per pre-operative assessment

## 2014-07-15 NOTE — Anesthesia Preprocedure Evaluation (Addendum)
Anesthesia Evaluation  Patient identified by MRN, date of birth, ID band Patient awake    Reviewed: Allergy & Precautions, NPO status , Patient's Chart, lab work & pertinent test results  Airway Mallampati: II  TM Distance: >3 FB Neck ROM: Full    Dental no notable dental hx.    Pulmonary shortness of breath,  Right pleural effusion. breath sounds clear to auscultation  Pulmonary exam normal       Cardiovascular Exercise Tolerance: Good hypertension, Pt. on medications and Pt. on home beta blockers + Peripheral Vascular Disease and +CHF + dysrhythmias Atrial Fibrillation Rhythm:Irregular Rate:Normal + Systolic murmurs ECHO: EF 35-40. AI   Neuro/Psych  Neuromuscular disease negative psych ROS   GI/Hepatic negative GI ROS, Neg liver ROS,   Endo/Other  negative endocrine ROS  Renal/GU Renal InsufficiencyRenal disease  negative genitourinary   Musculoskeletal  (+) Arthritis -,   Abdominal   Peds negative pediatric ROS (+)  Hematology  (+) anemia ,   Anesthesia Other Findings   Reproductive/Obstetrics negative OB ROS                          Anesthesia Physical Anesthesia Plan  ASA: III  Anesthesia Plan: General   Post-op Pain Management:    Induction: Intravenous  Airway Management Planned: LMA  Additional Equipment:   Intra-op Plan:   Post-operative Plan: Extubation in OR  Informed Consent: I have reviewed the patients History and Physical, chart, labs and discussed the procedure including the risks, benefits and alternatives for the proposed anesthesia with the patient or authorized representative who has indicated his/her understanding and acceptance.   Dental advisory given  Plan Discussed with: CRNA  Anesthesia Plan Comments: (Platelets 75K INR 1.37)       Anesthesia Quick Evaluation

## 2014-07-15 NOTE — Progress Notes (Signed)
[facility] PROGRESS NOTE  [name] RUE:[id number] DOB: 1925/05/17 DOA: 07/12/2014 PCP: [name], MD  Interim summary Patient is a pleasant 81 year old gentleman with a past medical history of chronic atrial fibrillation, previously on anticoagulation, stage III chronic unit disease, admitted to the medicine service on 07/12/2014. He was recently admitted to [facility] on 07/03/2014 presented with probable postobstructive uropathy, unfortunately left AGAINST MEDICAL ADVICE on 07/04/2014. CT scan of abdomen and pelvis performed on 07/02/2004 and revealed left hydronephrosis with dilated collecting system and proximal ureteral calculi. Status post right nephrostomy tube placement. His urologist Dr. [name] requesting medicine to admit as patient will likely undergo ureteroscopy on Monday. Patient has multiple medical conditions, and requested preoperative clearance. Transthoracic echocardiogram showed ejection fraction of 35-40% with global hypokinesis. Urinalysis performed on 07/12/2004 show the presence of many bacteria with large amount of leukocytes. Started on empiric IV antibiotic therapy with ceftriaxone 1 g IV every 24 hours. On 07/15/2014 he underwent Cystoscopy with left retrograde pyelogram,  left ureteroscopy, and bladder biopsy.    Assessment/Plan: 1.  Left hydronephrosis with ureteral calculi -Patient taken to the OR on 07/15/2014 undergoing cystoscopy with left retrograde pyelogram, procedure performed by Dr [name] -S/p bladder biopsy and fulguration of bladder lesions -Renal function improved   2.  Chronic atrial fibrillation/CHADS 3. -I held antihypertensives on 07/12/2014 because of hypotension. After discussion with cardiology have added back metoprolol at 12.5 mg by mouth twice a day -Anticoagulation has been held for today's procedure procedure, will restart coumadin with pharmacy consult. Cardiology did not recommend lovenox bridge.   3.   Urinary tract  infection . -Urinalysis revealed the presence of many bacteria and white blood cells -Awaiting urine culure -Started ceftriaxone 1 g IV every 24 hours on 07/13/2014  4.   Chronic systolic congestive heart failure -Pending transthoracic echocardiogram, last echo was performed in 2009.  -Status post transthoracic echocardiogram performed on 07/14/2014 showing ejection fraction of 35-40% with diffuse hypokinesis  -Cardiology falling, continue Lasix 40 mg by mouth daily. Kidney function improved   5.  Acute on chronic kidney failure -He recently presented to outside hospital having creatinine of 5.32 and BUN 113, likely due to postobstructive nephropathy. -Patient on Lasix 40 mg by mouth daily, with creatinine trending down to 2.84 today from 3.71 on admission   Code Status: DO NOT RESUSCITATE Family Communication:  Disposition Plan: Restarting anticoagulation   Consultants:  Urology  Cardiology   Antibiotics:  Ceftriaxone  HPI/Subjective:  patient reports doing well this morning states feeling a lot better, "feel stronger"   Objective: Filed Vitals:   07/15/14 1320  BP: 106/70  Pulse: 85  Temp: 97.6 F (36.4 C)  Resp: 14    Intake/Output Summary (Last 24 hours) at 07/15/14 1413 Last data filed at 07/15/14 1310  Gross per 24 hour  Intake    820 ml  Output   1045 ml  Net   -225 ml   Filed Weights   07/12/14 1630 07/14/14 0502 07/15/14 0427  Weight: 85.73 kg (189 lb) 86.093 kg (189 lb 12.8 oz) 86.229 kg (190 lb 1.6 oz)    Exam:   General:  Patient is awake and alert, has no complaints  Cardiovascular: Irregular rate and rhythm normal S1-S2 no murmurs rubs or gallops, 2+ bilateral extremity pitting edema  Respiratory: Normal respiratory effort, lungs overall clear to auscultation bilaterally  Abdomen: Soft nontender nondistended positive bowel sounds  Musculoskeletal: +2+ bilateral extremity pitting edema  Data Reviewed: Basic Metabolic Panel:  Recent  Labs Lab 07/12/14 1130 07/13/14 0543 07/15/14 0550  NA 142 144 139  K 5.5* 4.9 4.9  CL 112 113* 110  CO2 21 19 19   GLUCOSE 91 77 87  BUN 100* 95* 78*  CREATININE 3.71* 3.43* 2.84*  CALCIUM 8.5 8.6 8.3*   Liver Function Tests: No results for input(s): AST, ALT, ALKPHOS, BILITOT, PROT, ALBUMIN in the last 168 hours. No results for input(s): LIPASE, AMYLASE in the last 168 hours. No results for input(s): AMMONIA in the last 168 hours. CBC:  Recent Labs Lab 07/12/14 1130 07/13/14 0543 07/15/14 0550  WBC 5.0 4.8 3.6*  HGB 11.4* 11.1* 10.8*  HCT 36.1* 35.4* 33.8*  MCV 99.2 98.9 99.1  PLT 83* 81* 75*   Cardiac Enzymes: No results for input(s): CKTOTAL, CKMB, CKMBINDEX, TROPONINI in the last 168 hours. BNP (last 3 results)  Recent Labs  07/03/14 1200  BNP 740.0*    ProBNP (last 3 results)  Recent Labs  04/09/14 1038  PROBNP 9656.0*    CBG: No results for input(s): GLUCAP in the last 168 hours.  Recent Results (from the past 240 hour(s))  Surgical pcr screen     Status: None   Collection Time: 07/15/14  7:20 AM  Result Value Ref Range Status   MRSA, PCR NEGATIVE NEGATIVE Final   Staphylococcus aureus NEGATIVE NEGATIVE Final    Comment:        The Xpert SA Assay (FDA approved for NASAL specimens in patients over 28 years of age), is one component of a comprehensive surveillance program.  Test performance has been validated by [facility] for patients greater than or equal to 84 year old. It is not intended to diagnose infection nor to guide or monitor treatment.      Studies: No results found.  Scheduled Meds: . cefTRIAXone (ROCEPHIN)  IV  1 g Intravenous Q24H  . furosemide  40 mg Oral Daily  . latanoprost  1 drop Both Eyes QHS  . metoprolol tartrate  12.5 mg Oral BID  . simvastatin  20 mg Oral q1800  . sodium chloride  3 mL Intravenous Q12H  . sodium chloride  3 mL Intravenous Q12H   Continuous Infusions:   Active Problems:   Atrial  fibrillation, chronic   Acute on chronic renal failure   Thrombocytopenia   Systolic CHF, chronic   HTN (hypertension)   Kidney stones   Hydronephrosis   Hyperkalemia   Renal failure   AKI (acute kidney injury)   Warfarin anticoagulation   Essential hypertension    Time spent: 30 minutes    Jeralyn Bennett  [facility] Pager [phone]. If 7PM-7AM, please contact night-coverage at [email or web address], password [facility] 07/15/2014, 2:13 PM  LOS: 3 days

## 2014-07-16 ENCOUNTER — Encounter (HOSPITAL_COMMUNITY): Payer: Self-pay | Admitting: Urology

## 2014-07-16 DIAGNOSIS — Z7901 Long term (current) use of anticoagulants: Secondary | ICD-10-CM

## 2014-07-16 LAB — CBC
HCT: 34.9 % — ABNORMAL LOW (ref 39.0–52.0)
HEMOGLOBIN: 10.9 g/dL — AB (ref 13.0–17.0)
MCH: 31 pg (ref 26.0–34.0)
MCHC: 31.2 g/dL (ref 30.0–36.0)
MCV: 99.1 fL (ref 78.0–100.0)
Platelets: 83 10*3/uL — ABNORMAL LOW (ref 150–400)
RBC: 3.52 MIL/uL — ABNORMAL LOW (ref 4.22–5.81)
RDW: 18.5 % — AB (ref 11.5–15.5)
WBC: 3.4 10*3/uL — ABNORMAL LOW (ref 4.0–10.5)

## 2014-07-16 LAB — BASIC METABOLIC PANEL
ANION GAP: 10 (ref 5–15)
BUN: 72 mg/dL — ABNORMAL HIGH (ref 6–23)
CALCIUM: 8.2 mg/dL — AB (ref 8.4–10.5)
CO2: 20 mmol/L (ref 19–32)
Chloride: 108 mmol/L (ref 96–112)
Creatinine, Ser: 2.86 mg/dL — ABNORMAL HIGH (ref 0.50–1.35)
GFR calc Af Amer: 21 mL/min — ABNORMAL LOW (ref >90)
GFR, EST NON AFRICAN AMERICAN: 19 mL/min — AB (ref >90)
Glucose, Bld: 98 mg/dL (ref 70–99)
POTASSIUM: 5 mmol/L (ref 3.5–5.1)
Sodium: 138 mmol/L (ref 135–145)

## 2014-07-16 LAB — PROTIME-INR
INR: 1.37 (ref 0.00–1.49)
Prothrombin Time: 17 seconds — ABNORMAL HIGH (ref 11.6–15.2)

## 2014-07-16 MED ORDER — CEFPODOXIME PROXETIL 100 MG PO TABS: 100.0000 mg | ORAL_TABLET | Freq: Two times a day (BID) | ORAL | Status: DC

## 2014-07-16 MED ORDER — METOPROLOL TARTRATE 100 MG PO TABS: 50.0000 mg | ORAL_TABLET | Freq: Two times a day (BID) | ORAL | Status: DC

## 2014-07-16 MED ORDER — LOSARTAN POTASSIUM 50 MG PO TABS
25.0000 mg | ORAL_TABLET | Freq: Every morning | ORAL | Status: DC
Start: 2014-07-16 — End: 2014-07-19

## 2014-07-16 NOTE — Progress Notes (Signed)
D/C instructions reviewed w/ pt. Pt verbalized understanding and all questions answered. Pt d/c in w/c in stable condition by NT to family's car. Pt in possession of d/c instructions, script, and all personal belongings.

## 2014-07-16 NOTE — Discharge Summary (Signed)
Physician Discharge Summary  [name]:[id number] DOB: May 23, 1925 DOA: 07/12/2014  PCP: [name], MD  Admit date: 07/12/2014 Discharge date: 07/16/2014  Time spent: 45 minutes  Recommendations for Outpatient Follow-up:  1. INR on Friday 2. F/u BP and HR and adjust Lopressor, Losartan doses as needed  Discharge Condition: stable Diet recommendation: heart healthy  Discharge Diagnoses:  Principal Problem:   AKI (acute kidney injury) Active Problems:   Atrial fibrillation, chronic   Acute on chronic renal failure   Thrombocytopenia   Systolic CHF, chronic   HTN (hypertension)   Kidney stones   Hydronephrosis   Hyperkalemia   Renal failure   Warfarin anticoagulation   Essential hypertension   Chronic systolic heart failure   History of present illness:  Patient is a pleasant 81 year old gentleman with a past medical history of chronic atrial fibrillation, previously on anticoagulation, stage III chronic unit disease, admitted to the medicine service on 07/12/2014. He was recently admitted to [facility] on 07/03/2014 presented with probable postobstructive uropathy, unfortunately left AGAINST MEDICAL ADVICE on 07/04/2014. CT scan of abdomen and pelvis performed on 07/02/2004 and revealed left hydronephrosis with dilated collecting system and proximal ureteral calculi. Status post right nephrostomy tube placement. His urologist Dr. [name] requesting medicine to admit as patient will likely undergo ureteroscopy on Monday. Patient has multiple medical conditions, and requested preoperative clearance. Transthoracic echocardiogram showed ejection fraction of 35-40% with global hypokinesis. Urinalysis performed on 07/12/2004 show the presence of many bacteria with large amount of leukocytes. Started on empiric IV antibiotic therapy with ceftriaxone 1 g IV every 24 hours. On 07/15/2014 he underwent Cystoscopy with left retrograde pyelogram, left ureteroscopy, and bladder  biopsy.   Hospital Course:  1. Left hydronephrosis with ureteral calculi- AKI -Patient taken to the OR on 07/15/2014 for cystoscopy with left retrograde pyelogram (procedure performed by Dr [name])- per his note  "he had no evidence of obstruction and that there was a calcification that was seen beneath the ureteral mucosa and will appear to be a ureteral stone on follow-up imaging studies but is not causing obstruction". 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 - he does recommend a surveillance cystoscopy in 3 mo and will f/u on path report -Renal function improved   2. Chronic atrial fibrillation/CHADS 3. -Anticoagulation with Coumadin resumed after procedure- Cardiology did not recommend lovenox bridge- Dr Eden Emms states today that patient is safe for d/c and f/u at [facility] office in [place].   -BP has been low and we have been giving pt only 12.5 of Metoprolol but HR now increasing- today,  Dr Eden Emms recommends Metoprolol 50 BID and Losartan 25 mg daily and stop Norvasc  3. Urinary tract infection . -Urinalysis revealed the presence of many bacteria and white blood cells- culture never done -Started on ceftriaxone 1 g IV every 24 hours on 07/13/2014- switched to [facility] for a total of 7 days.   4. Chronic systolic congestive heart failure -Pending transthoracic echocardiogram, last echo was performed in 2009.  -Status post transthoracic echocardiogram performed on 07/14/2014 showing ejection fraction of 35-40% with diffuse hypokinesis  -Cardiology has been following, continue Lasix 40 mg by mouth daily per Dr Eden Emms - Kidney function improved   5. Acute on chronic kidney failure -He recently presented to outside hospital having creatinine of 5.32 and BUN 113, likely due  to  postobstructive nephropathy. -Patient on Lasix 40 mg by mouth daily, with creatinine trending down to 2.86 today from 3.71 on admission    Procedures: PROCEDURE:  1. Cystoscopy with left retrograde pyelogram including interpretation. 2. Left ureteroscopy 3. Bladder biopsy and fulguration of bladder lesions.   Consultations:  Cardiology  Urology   Discharge Exam: Filed Weights   07/14/14 0502 07/15/14 0427 07/16/14 5409  Weight: 86.093 kg (189 lb 12.8 oz) 86.229 kg (190 lb 1.6 oz) 86.546 kg (190 lb 12.8 oz)   Filed Vitals:   07/16/14 0838  BP: 93/54  Pulse: 114  Temp: 97.7 F (36.5 C)  Resp: 18    General: AAO x 3, no distress Cardiovascular: IIRR, no murmurs  Respiratory: clear to auscultation bilaterally GI: soft, non-tender, non-distended, bowel sound positive  Discharge Instructions You were cared for by a hospitalist during your hospital stay. If you have any questions about your discharge medications or the care you received while you were in the hospital after you are discharged, you can call the unit and asked to speak with the hospitalist on call if the hospitalist that took care of you is not available. Once you are discharged, your primary care physician will handle any further medical issues. Please note that NO REFILLS for any discharge medications will be authorized once you are discharged, as it is imperative that you return to your primary care physician (or establish a relationship with a primary care physician if you do not have one) for your aftercare needs so that they can reassess your need for medications and monitor your lab values.      Discharge Instructions    (HEART FAILURE PATIENTS) Call MD:  Anytime you have any of the following symptoms: 1) 3 pound weight gain in 24 hours or 5 pounds in 1 week 2) shortness of breath, with or without a dry hacking cough 3) swelling in the hands, feet or stomach 4) if you have to sleep on extra pillows at night in  order to breathe.    Complete by:  As directed      Diet - low sodium heart healthy    Complete by:  As directed      Increase activity slowly    Complete by:  As directed             Medication List    STOP taking these medications        amLODipine 2.5 MG tablet  Commonly known as:  NORVASC     amLODipine 5 MG tablet  Commonly known as:  NORVASC      TAKE these medications        acetaminophen 500 MG tablet  Commonly known as:  TYLENOL  Take 1,000 mg by mouth every 6 (six) hours as needed for mild pain, moderate pain or headache.     cefpodoxime 100 MG tablet  Commonly known as:  VANTIN  Take 1 tablet (100 mg total) by mouth 2 (two) times daily.     colchicine 0.6 MG tablet  Commonly known as:  COLCRYS  TAKE ONE TABLET BY MOUTH 2 TIMES A DAY FOR GOUT.     furosemide 40 MG tablet  Commonly known as:  LASIX  Take 1 and 1/2 tablets by  mouth every morning     latanoprost 0.005 % ophthalmic solution  Commonly known as:  XALATAN  Place 1 drop into both eyes at bedtime.     losartan 50 MG tablet  Commonly  known as:  COZAAR  Take 0.5 tablets (25 mg total) by mouth every morning.     metoprolol 100 MG tablet  Commonly known as:  LOPRESSOR  Take 0.5 tablets (50 mg total) by mouth 2 (two) times daily.     simvastatin 20 MG tablet  Commonly known as:  ZOCOR  Take 20 mg by mouth daily.     Vitamin B-12 2000 MCG Tbcr  Take 2,000 mcg by mouth daily.     warfarin 5 MG tablet  Commonly known as:  COUMADIN  Take 5 mg by mouth See admin instructions. Per 3/7 visit, Hold coumadin 3/7 and 3/8. Take 1/2 on Wednesday 3/9,  then resume 1/2 tablet daily except none on Wednesdays     zolpidem 10 MG tablet  Commonly known as:  AMBIEN  TAKE 1 TABLET AT BEDTIME AS NEEDED FOR SLEEP.       Allergies  Allergen Reactions  . Sulfa Antibiotics Rash    Nausea    Follow-up Information    Follow up with Garnett Farm, MD.   Specialty:  Urology   Why:  Please call the office  when he get home to schedule your follow-up appointment in 3 months as we discussed.   Contact information:   [address] [place] [address] [phone]        The results of significant diagnostics from this hospitalization (including imaging, microbiology, ancillary and laboratory) are listed below for reference.    Significant Diagnostic Studies: Ct Abdomen Pelvis Wo Contrast  07/03/2014   CLINICAL DATA:  Multiple change out sub nephrostomy tube. Renal failure. Renal calculi. Infrarenal aortic aneurysm.  EXAM: CT ABDOMEN AND PELVIS WITHOUT CONTRAST  TECHNIQUE: Multidetector CT imaging of the abdomen and pelvis was performed following the standard protocol without IV contrast.  COMPARISON:  06/13/2013  FINDINGS: Lower chest: Marked cardiomegaly with extensive coronary artery atherosclerosis. Aortic valve calcification.  Large right pleural effusion with passive atelectasis. Mild airway thickening in the left lower lobe.  Hepatobiliary: Stable hypodense lesion in the dome of segment 2 of the liver, proximally 9 mm in diameter on image 16 series 2. Cholecystectomy.  Pancreas: Unremarkable  Spleen: Unremarkable  Adrenals/Urinary Tract: Thin, atrophic right renal parenchyma. Right nephrostomy tube in place coiled in the lower pole collecting system, with a small amount of gas in the collecting system but without overt hydronephrosis. Fluid stranding in the perirenal spaces and renal hilar spaces appears commensurate with the generalized mesenteric, omental, and subcutaneous edema indicating third spacing of fluid. A 3 mm right UPJ calculus is present, image 40 series 2. Vascular calcifications are present along the right renal hilum.  Scattered exophytic lesions of both kidneys of varying complexity are again observed common non having demonstrated worrisome increase in size since the exam from 1 year ago.  On the left, there is hydronephrosis and a flaccid and distended renal collecting system along  with several proximal ureteral calculi including a 3 mm calculus on image 49 of series 2 and a 4 mm calculus on image 52 of series 2. The ureter is difficult to follow due to the surrounding fluid. Urinary bladder unremarkable.  Stomach/Bowel: Descending and sigmoid colon diverticulosis.  Vascular/Lymphatic: Extensive atherosclerotic calcification.  Fusiform infrarenal abdominal aortic aneurysm, 5.6 cm anterior - posterior by 5.4 cm transverse (formerly 5.2 by 5.4 cm). The aneurysm extends into the common iliac vessels which are both dilated.  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  Reproductive: Unremarkable  Other: Diffuse subcutaneous, mesenteric and, and omental edema compatible with third spacing of fluid. Large right pleural effusion and scattered ascites along with presacral edema and bilateral hydroceles extending into the fat containing inguinal hernias. The right inguinal hernia contains a loop of small bowel without findings of strangulation or obstruction. There is subcutaneous edema along the pubis.  Musculoskeletal: Bridging spurring of both sacroiliac joints.  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 There is grade 1 retrolisthesis at L3-4 and grade 1 anterolisthesis at L4-5.  Healing left ninth and tenth distal rib fractures noted.  IMPRESSION: 1. No right hydronephrosis. Abnormal atrophic thinning of both kidneys with scattered complex cysts of both kidneys. The right nephrostomy tube coils in the right kidney lower pole collecting system. 2. Left hydronephrosis with dilated/flaccid collecting system and several proximal ureteral calculi.  There is also a small right kidney UPJ stone. 3. Extensive third spacing of fluid including ascites, large right pleural effusion, and subcutaneous, mesenteric, and omental edema. 4. Mild enlargement of the infrarenal abdominal aortic aneurysm. 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 5. Descending and sigmoid colon diverticulosis without active diverticulitis. 6. Severe lumbar spondylosis and degenerative disc disease with multilevel impingement. 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 7. The right inguinal hernia contains a loop of small bowel without strangulation or obstruction. 8. Healing left ninth and tenth rib fractures.   Electronically Signed   By: Gaylyn Rong M.D.   On: 07/03/2014 18:34   Dg Chest 1 View  07/12/2014   CLINICAL DATA:  Congestive Heart Failure; congested cough; no other complaints from pt  EXAM: CHEST  1 VIEW  COMPARISON:  07/12/2014 at 11:36 a.m.  FINDINGS: Moderate 2 large right-sided pleural effusion is without significant change from the prior exam allowing for the semi-erect positioning on the current study. Associated right lung base opacity, likely atelectasis, is also stable. No convincing pulmonary edema. No left pleural effusion.  Stable cardiomegaly.  No pneumothorax.  IMPRESSION: No convincing change from the radiograph obtained earlier the same date. Moderate to large right pleural effusion with associated right lung base opacity, most likely atelectasis. No convincing pulmonary edema.   Electronically Signed   By: Amie Portland M.D.   On: 07/12/2014 21:50   Dg Chest 2 View  07/12/2014   CLINICAL DATA:  Irregular heartbeat  EXAM: CHEST  2 VIEW  COMPARISON:  07/03/2014  FINDINGS: Persistent changes in the right lung base are noted consistent with effusion and underlying atelectasis/ infiltrate. Postsurgical changes are again seen. The cardiac shadow is enlarged. The left lung remains clear.  IMPRESSION: Persistent right-sided effusion and basilar  atelectasis/infiltrate. The overall appearance is stable from the prior exam.   Electronically Signed   By: Alcide Clever M.D.   On: 07/12/2014 14:13   Dg Chest Portable 1 View  07/03/2014   CLINICAL DATA:  Fatigue since January, 2016.  EXAM: PORTABLE CHEST - 1 VIEW  COMPARISON:  PA and lateral chest 04/09/2014 and 06/06/2013.  FINDINGS: Small right effusion and basilar airspace disease have worsened since the most recent examination. No left pleural effusion is identified. There is cardiomegaly without edema.  IMPRESSION: Worsened right effusion and basilar airspace disease.  Electronically Signed   By: Drusilla Kanner M.D.   On: 07/03/2014 12:29    Microbiology: Recent Results (from the past 240 hour(s))  Surgical pcr screen     Status: None   Collection Time: 07/15/14  7:20 AM  Result Value Ref Range Status   MRSA, PCR NEGATIVE NEGATIVE Final   Staphylococcus aureus NEGATIVE NEGATIVE Final    Comment:        The Xpert SA Assay (FDA approved for NASAL specimens in patients over 63 years of age), is one component of a comprehensive surveillance program.  Test performance has been validated by [facility] for patients greater than or equal to 25 year old. It is not intended to diagnose infection nor to guide or monitor treatment.      Labs: Basic Metabolic Panel:  Recent Labs Lab 07/12/14 1130 07/13/14 0543 07/15/14 0550 07/16/14 0505  NA 142 144 139 138  K 5.5* 4.9 4.9 5.0  CL 112 113* 110 108  CO2 21 19 19 20   GLUCOSE 91 77 87 98  BUN 100* 95* 78* 72*  CREATININE 3.71* 3.43* 2.84* 2.86*  CALCIUM 8.5 8.6 8.3* 8.2*   Liver Function Tests: No results for input(s): AST, ALT, ALKPHOS, BILITOT, PROT, ALBUMIN in the last 168 hours. No results for input(s): LIPASE, AMYLASE in the last 168 hours. No results for input(s): AMMONIA in the last 168 hours. CBC:  Recent Labs Lab 07/12/14 1130 07/13/14 0543 07/15/14 0550 07/16/14 0505  WBC 5.0 4.8 3.6* 3.4*  HGB 11.4*  11.1* 10.8* 10.9*  HCT 36.1* 35.4* 33.8* 34.9*  MCV 99.2 98.9 99.1 99.1  PLT 83* 81* 75* 83*   Cardiac Enzymes: No results for input(s): CKTOTAL, CKMB, CKMBINDEX, TROPONINI in the last 168 hours. BNP: BNP (last 3 results)  Recent Labs  07/03/14 1200  BNP 740.0*    ProBNP (last 3 results)  Recent Labs  04/09/14 1038  PROBNP 9656.0*    CBG: No results for input(s): GLUCAP in the last 168 hours.     SignedCalvert Cantor, MD [facility] 07/16/2014, 8:47 AM

## 2014-07-16 NOTE — Progress Notes (Addendum)
Patient ID: [name], male   DOB: 1925/08/31, 81 y.o.   MRN: [id number]   SUBJECTIVE:  Wants to go home  No chest pain dyspnea or palpitations   OBJECTIVE:   Vitals:   Filed Vitals:   07/15/14 1636 07/15/14 1835 07/15/14 2231 07/16/14 0635  BP:  100/64 105/72 96/75  Pulse:  106 65 117  Temp:  97.5 F (36.4 C) 97.7 F (36.5 C) 97.6 F (36.4 C)  TempSrc:  Oral Oral Oral  Resp:  18 18 18   Height:      Weight:    190 lb 12.8 oz (86.546 kg)  SpO2: 95% 96% 92% 96%   I&O's:    Intake/Output Summary (Last 24 hours) at 07/16/14 9563 Last data filed at 07/15/14 1836  Gross per 24 hour  Intake   1420 ml  Output    820 ml  Net    600 ml   TELEMETRY: Reviewed telemetry pt in atrial fibrillation at 90-110  bpm: 07/16/2014    Current facility-administered medications:  .  0.9 %  sodium chloride infusion, 250 mL, Intravenous, PRN, Jeralyn Bennett, MD .  acetaminophen (TYLENOL) tablet 650 mg, 650 mg, Oral, Q6H PRN **OR** acetaminophen (TYLENOL) suppository 650 mg, 650 mg, Rectal, Q6H PRN, Jeralyn Bennett, MD .  alum & mag hydroxide-simeth (MAALOX/MYLANTA) 200-200-20 MG/5ML suspension 30 mL, 30 mL, Oral, Q6H PRN, Jeralyn Bennett, MD .  cefTRIAXone (ROCEPHIN) 1 g in dextrose 5 % 50 mL IVPB - Premix, 1 g, Intravenous, Q24H, Jeralyn Bennett, MD, 1 g at 07/15/14 1110 .  furosemide (LASIX) tablet 40 mg, 40 mg, Oral, Daily, Jeralyn Bennett, MD, 40 mg at 07/15/14 0906 .  latanoprost (XALATAN) 0.005 % ophthalmic solution 1 drop, 1 drop, Both Eyes, QHS, Jeralyn Bennett, MD, 1 drop at 07/15/14 2207 .  metoprolol tartrate (LOPRESSOR) tablet 12.5 mg, 12.5 mg, Oral, BID, Jeralyn Bennett, MD, 12.5 mg at 07/15/14 2208 .  morphine 2 MG/ML injection 1 mg, 1 mg, Intravenous, Q3H PRN, Jeralyn Bennett, MD .  ondansetron (ZOFRAN) tablet 4 mg, 4 mg, Oral, Q6H PRN **OR** ondansetron (ZOFRAN) injection 4 mg, 4 mg, Intravenous, Q6H PRN, Jeralyn Bennett, MD .  oxyCODONE (Oxy IR/ROXICODONE) immediate release tablet 5  mg, 5 mg, Oral, Q4H PRN, Jeralyn Bennett, MD, 5 mg at 07/14/14 2129 .  simvastatin (ZOCOR) tablet 20 mg, 20 mg, Oral, q1800, Jeralyn Bennett, MD, 20 mg at 07/15/14 1722 .  sodium chloride 0.9 % injection 3 mL, 3 mL, Intravenous, Q12H, Jeralyn Bennett, MD, 3 mL at 07/15/14 2211 .  sodium chloride 0.9 % injection 3 mL, 3 mL, Intravenous, Q12H, Jeralyn Bennett, MD, 3 mL at 07/15/14 2212 .  sodium chloride 0.9 % injection 3 mL, 3 mL, Intravenous, PRN, Jeralyn Bennett, MD .  traZODone (DESYREL) tablet 50 mg, 50 mg, Oral, QHS PRN, Jeralyn Bennett, MD, 50 mg at 07/15/14 2207 .  Warfarin - Pharmacist Dosing Inpatient, , Does not apply, q1800, Jamse Mead, RPH, 0  at 07/15/14 1800   PHYSICAL EXAM General: Well developed, well nourished, in no acute distress Head: Eyes PERRLA, No xanthomas.   Normal cephalic and atramatic  Lungs:  Decreased BS at right base  Heart:   Irregularly irregular S1 S2 SEM  Pulses are 2+ & equal.  JVP elevated with V wave  Abdomen: Bowel sounds are positive, abdomen soft and non-tender without masses  Extremities:   No clubbing, cyanosis or edema.  DP +1 Neuro: Alert and oriented X 3. Psych:  Good affect,  responds appropriately Uretosotomy tube on right side   LABS: Basic Metabolic Panel:  Recent Labs  16/10/96 0550 07/16/14 0505  NA 139 138  K 4.9 5.0  CL 110 108  CO2 19 20  GLUCOSE 87 98  BUN 78* 72*  CREATININE 2.84* 2.86*  CALCIUM 8.3* 8.2*   CBC:  Recent Labs  07/15/14 0550 07/16/14 0505  WBC 3.6* 3.4*  HGB 10.8* 10.9*  HCT 33.8* 34.9*  MCV 99.1 99.1  PLT 75* 83*   Coag Panel:   Lab Results  Component Value Date   INR 1.37 07/16/2014   INR 1.37 07/15/2014   INR 1.41 07/12/2014    RADIOLOGY: Ct Abdomen Pelvis Wo Contrast  07/03/2014   CLINICAL DATA:  Multiple change out sub nephrostomy tube. Renal failure. Renal calculi. Infrarenal aortic aneurysm.  EXAM: CT ABDOMEN AND PELVIS WITHOUT CONTRAST  TECHNIQUE: Multidetector CT imaging of the  abdomen and pelvis was performed following the standard protocol without IV contrast.  COMPARISON:  06/13/2013  FINDINGS: Lower chest: Marked cardiomegaly with extensive coronary artery atherosclerosis. Aortic valve calcification.  Large right pleural effusion with passive atelectasis. Mild airway thickening in the left lower lobe.  Hepatobiliary: Stable hypodense lesion in the dome of segment 2 of the liver, proximally 9 mm in diameter on image 16 series 2. Cholecystectomy.  Pancreas: Unremarkable  Spleen: Unremarkable  Adrenals/Urinary Tract: Thin, atrophic right renal parenchyma. Right nephrostomy tube in place coiled in the lower pole collecting system, with a small amount of gas in the collecting system but without overt hydronephrosis. Fluid stranding in the perirenal spaces and renal hilar spaces appears commensurate with the generalized mesenteric, omental, and subcutaneous edema indicating third spacing of fluid. A 3 mm right UPJ calculus is present, image 40 series 2. Vascular calcifications are present along the right renal hilum.  Scattered exophytic lesions of both kidneys of varying complexity are again observed common non having demonstrated worrisome increase in size since the exam from 1 year ago.  On the left, there is hydronephrosis and a flaccid and distended renal collecting system along with several proximal ureteral calculi including a 3 mm calculus on image 49 of series 2 and a 4 mm calculus on image 52 of series 2. The ureter is difficult to follow due to the surrounding fluid. Urinary bladder unremarkable.  Stomach/Bowel: Descending and sigmoid colon diverticulosis.  Vascular/Lymphatic: Extensive atherosclerotic calcification.  Fusiform infrarenal abdominal aortic aneurysm, 5.6 cm anterior - posterior by 5.4 cm transverse (formerly 5.2 by 5.4 cm). The aneurysm extends into the common iliac vessels which are both dilated.  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  Reproductive: Unremarkable  Other: Diffuse subcutaneous, mesenteric and, and omental edema compatible with third spacing of fluid. Large right pleural effusion and scattered ascites along with presacral edema and bilateral hydroceles extending into the fat containing inguinal hernias. The right inguinal hernia contains a loop of small bowel without findings of strangulation or obstruction. There is subcutaneous edema along the pubis.  Musculoskeletal: Bridging spurring of both sacroiliac joints.  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 There is grade 1 retrolisthesis at L3-4 and grade 1 anterolisthesis at L4-5.  Healing  left ninth and tenth distal rib fractures noted.  IMPRESSION: 1. No right hydronephrosis. Abnormal atrophic thinning of both kidneys with scattered complex cysts of both kidneys. The right nephrostomy tube coils in the right kidney lower pole collecting system. 2. Left hydronephrosis with dilated/flaccid collecting system and several proximal ureteral calculi. There is also a small right kidney UPJ stone. 3. Extensive third spacing of fluid including ascites, large right pleural effusion, and subcutaneous, mesenteric, and omental edema. 4. Mild enlargement of the infrarenal abdominal aortic aneurysm. 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 5. Descending and sigmoid colon diverticulosis without active diverticulitis. 6. Severe lumbar spondylosis and degenerative disc disease with multilevel impingement. 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 7. The right inguinal hernia contains a loop of small bowel without strangulation or obstruction. 8.  Healing left ninth and tenth rib fractures.   Electronically Signed   By: Gaylyn Rong M.D.   On: 07/03/2014 18:34   Dg Chest 1 View  07/12/2014   CLINICAL DATA:  Congestive Heart Failure; congested cough; no other complaints from pt  EXAM: CHEST  1 VIEW  COMPARISON:  07/12/2014 at 11:36 a.m.  FINDINGS: Moderate 2 large right-sided pleural effusion is without significant change from the prior exam allowing for the semi-erect positioning on the current study. Associated right lung base opacity, likely atelectasis, is also stable. No convincing pulmonary edema. No left pleural effusion.  Stable cardiomegaly.  No pneumothorax.  IMPRESSION: No convincing change from the radiograph obtained earlier the same date. Moderate to large right pleural effusion with associated right lung base opacity, most likely atelectasis. No convincing pulmonary edema.   Electronically Signed   By: Amie Portland M.D.   On: 07/12/2014 21:50   Dg Chest 2 View  07/12/2014   CLINICAL DATA:  Irregular heartbeat  EXAM: CHEST  2 VIEW  COMPARISON:  07/03/2014  FINDINGS: Persistent changes in the right lung base are noted consistent with effusion and underlying atelectasis/ infiltrate. Postsurgical changes are again seen. The cardiac shadow is enlarged. The left lung remains clear.  IMPRESSION: Persistent right-sided effusion and basilar atelectasis/infiltrate. The overall appearance is stable from the prior exam.   Electronically Signed   By: Alcide Clever M.D.   On: 07/12/2014 14:13   Dg Chest Portable 1 View  07/03/2014   CLINICAL DATA:  Fatigue since January, 2016.  EXAM: PORTABLE CHEST - 1 VIEW  COMPARISON:  PA and lateral chest 04/09/2014 and 06/06/2013.  FINDINGS: Small right effusion and basilar airspace disease have worsened since the most recent examination. No left pleural effusion is identified. There is cardiomegaly without edema.  IMPRESSION: Worsened right effusion and basilar airspace disease.   Electronically Signed    By: Drusilla Kanner M.D.   On: 07/03/2014 12:29    Impression and Recommendations  1.   Atrial fibrillation, chronic BP is soft  Decrease losartan to 25 mg  D/C norvasc and send home on lopressor 50 bid      2.   Acute on chronic renal failure  Baseline Cr about 2.8 significant parenchymal loss due to chronic hydronephrosis   3.  Thrombocytopenia  4.  Systolic CHF, chronic Echo reviewed EF 30-35%  Lasix 40 mg daily   5.  HTN (hypertension) - controlled and on the soft side  6.  Kidney stones  Hydronephrosis  F/U Otelin  Has nephrostomy tube in place   7.  Warfarin anticoagulation            Resume home  dose will have INR checked by Vashti Hey in [facility] office and arrange f/u with them Friday to assess CHF  Ok to d/c home     Charlton Haws, MD  07/16/2014  8:23 AM

## 2014-07-16 NOTE — Progress Notes (Signed)
1 Day Post-Op Subjective: Patient reports no hematuria or flank pain.  Objective: Vital signs in last 24 hours: Temp:  [97.1 F (36.2 C)-97.7 F (36.5 C)] 97.6 F (36.4 C) (03/22 0635) Pulse Rate:  [39-139] 117 (03/22 0635) Resp:  [14-28] 18 (03/22 0635) BP: (71-106)/(47-82) 96/75 mmHg (03/22 0635) SpO2:  [89 %-100 %] 96 % (03/22 0635) Weight:  [86.546 kg (190 lb 12.8 oz)] 86.546 kg (190 lb 12.8 oz) (03/22 0635)  Intake/Output from previous day: 03/21 0701 - 03/22 0700 In: 1420 [P.O.:840; I.V.:580] Out: 820 [Urine:820] Intake/Output this shift:    Physical Exam:  His nephrostomy tube is draining clear urine on the right-hand side. He has no left CVAT. Abdomen is soft and nontender.  Lab Results:  Recent Labs  07/15/14 0550 07/16/14 0505  HGB 10.8* 10.9*  HCT 33.8* 34.9*   BMET  Recent Labs  07/15/14 0550 07/16/14 0505  NA 139 138  K 4.9 5.0  CL 110 108  CO2 19 20  GLUCOSE 87 98  BUN 78* 72*  CREATININE 2.84* 2.86*  CALCIUM 8.3* 8.2*    Recent Labs  07/15/14 0550  INR 1.37   No results for input(s): LABURIN in the last 72 hours. Results for orders placed or performed during the hospital encounter of 07/12/14  Surgical pcr screen     Status: None   Collection Time: 07/15/14  7:20 AM  Result Value Ref Range Status   MRSA, PCR NEGATIVE NEGATIVE Final   Staphylococcus aureus NEGATIVE NEGATIVE Final    Comment:        The Xpert SA Assay (FDA approved for NASAL specimens in patients over 62 years of age), is one component of a comprehensive surveillance program.  Test performance has been validated by [facility] for patients greater than or equal to 77 year old. It is not intended to diagnose infection nor to guide or monitor treatment.     Studies/Results: No results found.  Assessment/Plan: 1.  Chronic right ureteral obstruction managed with nephrostomy tube.  This will remain in place and he will continue to have it changed by IR.  2.   History of left ureteral calculi.  His CT scan done at any pan appeared to show ureteral calculi however there was not any dilation of his renal pelvis but the chronic changes from his previous obstruction.  I found at the time of ureteroscopy yesterday that he had no evidence of obstruction and that there was a calcification that was seen beneath the ureteral mucosa and will appear to be a ureteral stone on follow-up imaging studies but is not causing obstruction.  3.  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  I have discussed this finding with the patient and the fact that given his age and comorbidities I would not recommend any form of adjuvant therapy such as chemotherapy, radiation therapy or intravesical BCG at this time.  The lesions were all completely fulgurated and the pathology remains pending at this time.  I told him that I would tentatively plan to have him return to the office for surveillance cystoscopy in 3 months and would contact him if his pathology showed the need for any change in this plan.  4.  Acute on chronic renal insufficiency.  He has marked loss of renal parenchyma bilaterally due to long-standing obstruction in the past.  It appears his creatinine is now back to his baseline.   He could  be discharged from urologic standpoint with follow-up in 3 months.   LOS: 4 days   Cherylene Ferrufino C 07/16/2014, 7:23 AM

## 2014-07-16 NOTE — Progress Notes (Signed)
Drs Butler [place] and [place] on unit and made aware pt's tele afib 120-130s. Dr Eden Emms states to give AM dose of 12.5mg  metoprolol now and pt is safe for d/c home.

## 2014-07-16 NOTE — Discharge Instructions (Signed)

## 2014-07-19 ENCOUNTER — Ambulatory Visit (INDEPENDENT_AMBULATORY_CARE_PROVIDER_SITE_OTHER): Payer: Medicare Other | Admitting: Adult Health

## 2014-07-19 ENCOUNTER — Encounter: Payer: Self-pay | Admitting: Adult Health

## 2014-07-19 VITALS — BP 96/63 | HR 102 | Ht 70.0 in | Wt 193.0 lb

## 2014-07-19 DIAGNOSIS — I10 Essential (primary) hypertension: Secondary | ICD-10-CM

## 2014-07-19 DIAGNOSIS — I519 Heart disease, unspecified: Secondary | ICD-10-CM | POA: Diagnosis not present

## 2014-07-19 DIAGNOSIS — N289 Disorder of kidney and ureter, unspecified: Secondary | ICD-10-CM | POA: Diagnosis not present

## 2014-07-19 DIAGNOSIS — I482 Chronic atrial fibrillation, unspecified: Secondary | ICD-10-CM

## 2014-07-19 MED ORDER — METOPROLOL TARTRATE 100 MG PO TABS: 100.0000 mg | ORAL_TABLET | Freq: Two times a day (BID) | ORAL | Status: AC

## 2014-07-19 NOTE — Progress Notes (Deleted)
Name: [name]    DOB: [birth year]  Age: 81 y.o.  MR#: [id number]       PCP:  [name], MD      Insurance: Payor: MEDICARE / Plan: MEDICARE PART A AND B / Product Type: *No Product type* /   CC:    Chief Complaint  Patient presents with  . Atrial Fibrillation  . Congestive Heart Failure  . Hypertension    VS Filed Vitals:   07/19/14 1450  BP: 96/63  Pulse: 102  Height: 5\' 10"  (1.778 m)  Weight: 193 lb (87.544 kg)  SpO2: 81%    Weights Current Weight  07/19/14 193 lb (87.544 kg)  07/12/14 193 lb (87.544 kg)  07/04/14 189 lb 6 oz (85.9 kg)    Blood Pressure  BP Readings from Last 3 Encounters:  07/19/14 96/63  07/12/14 98/63  07/04/14 108/64     Admit date:  (Not on file) Last encounter with RMR:  07/01/2014   Allergy Sulfa antibiotics  Current Outpatient Prescriptions  Medication Sig Dispense Refill  . acetaminophen (TYLENOL) 500 MG tablet Take 1,000 mg by mouth every 6 (six) hours as needed for mild pain, moderate pain or headache.     Marland Kitchen amLODipine (NORVASC) 2.5 MG tablet Take 2.5 mg by mouth daily.    . cefpodoxime (VANTIN) 100 MG tablet Take 1 tablet (100 mg total) by mouth 2 (two) times daily. 7 tablet 0  . colchicine (COLCRYS) 0.6 MG tablet TAKE ONE TABLET BY MOUTH 2 TIMES A DAY FOR GOUT. (Patient taking differently: Take 0.6 mg by mouth daily. ) 24 tablet 11  . Cyanocobalamin (VITAMIN B-12) 2000 MCG TBCR Take 2,000 mcg by mouth daily.     . furosemide (LASIX) 40 MG tablet Take 1 and 1/2 tablets by  mouth every morning (Patient taking differently: take 1 tablet by mouth every morning.) 135 tablet 0  . latanoprost (XALATAN) 0.005 % ophthalmic solution Place 1 drop into both eyes at bedtime.     Marland Kitchen losartan (COZAAR) 50 MG tablet Take 0.5 tablets (25 mg total) by mouth every morning. 30 tablet 0  . metoprolol (LOPRESSOR) 100 MG tablet Take 0.5 tablets (50 mg total) by mouth 2 (two) times daily. 120 tablet 10  . simvastatin (ZOCOR) 20 MG tablet Take 20 mg by mouth  daily.    Marland Kitchen warfarin (COUMADIN) 5 MG tablet Take 5 mg by mouth See admin instructions. Per 3/7 visit, Hold coumadin 3/7 and 3/8. Take 1/2 on Wednesday 3/9,  then resume 1/2 tablet daily except none on Wednesdays     No current facility-administered medications for this visit.    Discontinued Meds:    Medications Discontinued During This Encounter  Medication Reason  . zolpidem (AMBIEN) 10 MG tablet Error    Patient Active Problem List   Diagnosis Date Noted  . Chronic systolic heart failure   . Essential hypertension   . Warfarin anticoagulation 07/13/2014  . Systolic CHF, chronic 07/12/2014  . HTN (hypertension) 07/12/2014  . Kidney stones 07/12/2014  . Hydronephrosis 07/12/2014  . AKI (acute kidney injury) 07/12/2014  . Hyperkalemia   . Renal failure   . CHF (congestive heart failure) 07/03/2014  . Acute on chronic renal failure 07/03/2014  . Generalized weakness 07/03/2014  . Thrombocytopenia 07/03/2014  . Dysphagia, pharyngoesophageal phase 07/01/2014  . Congestive heart failure 04/14/2014  . Insomnia 04/14/2014  . Encounter for therapeutic drug monitoring 06/04/2013  . Esophageal candidiasis 02/16/2013  . Nonspecific abnormal finding in stool contents  02/13/2013  . Acute renal failure 02/12/2013  . Hypotension 02/12/2013  . Melena 02/12/2013  . Anemia 02/12/2013  . Urinary tract infection, site not specified 02/12/2013  . Sepsis 02/12/2013  . Thoracic aortic aneurysm 02/03/2011  . Aneurysm of peripheral artery 02/03/2011  . Chronic kidney disease, stage III (moderate)   . AAA (abdominal aortic aneurysm)   . White coat hypertension   . Nephrolithiasis   . Chronic anticoagulation 07/23/2010  . VITAMIN B12 DEFICIENCY 02/21/2009  . HYPERLIPIDEMIA 02/18/2009  . Gout 02/18/2009  . Atrial fibrillation, chronic 02/18/2009    LABS    Component Value Date/Time   NA 138 07/16/2014 0505   NA 139 07/15/2014 0550   NA 144 07/13/2014 0543   K 5.0 07/16/2014 0505   K  4.9 07/15/2014 0550   K 4.9 07/13/2014 0543   CL 108 07/16/2014 0505   CL 110 07/15/2014 0550   CL 113* 07/13/2014 0543   CO2 20 07/16/2014 0505   CO2 19 07/15/2014 0550   CO2 19 07/13/2014 0543   GLUCOSE 98 07/16/2014 0505   GLUCOSE 87 07/15/2014 0550   GLUCOSE 77 07/13/2014 0543   BUN 72* 07/16/2014 0505   BUN 78* 07/15/2014 0550   BUN 95* 07/13/2014 0543   CREATININE 2.86* 07/16/2014 0505   CREATININE 2.84* 07/15/2014 0550   CREATININE 3.43* 07/13/2014 0543   CREATININE 3.44* 11/19/2013 0948   CREATININE 2.01* 11/16/2012 0710   CREATININE 2.65* 10/05/2012 0725   CALCIUM 8.2* 07/16/2014 0505   CALCIUM 8.3* 07/15/2014 0550   CALCIUM 8.6 07/13/2014 0543   GFRNONAA 19* 07/16/2014 0505   GFRNONAA 19* 07/15/2014 0550   GFRNONAA 15* 07/13/2014 0543   GFRAA 21* 07/16/2014 0505   GFRAA 22* 07/15/2014 0550   GFRAA 17* 07/13/2014 0543   CMP     Component Value Date/Time   NA 138 07/16/2014 0505   K 5.0 07/16/2014 0505   CL 108 07/16/2014 0505   CO2 20 07/16/2014 0505   GLUCOSE 98 07/16/2014 0505   BUN 72* 07/16/2014 0505   CREATININE 2.86* 07/16/2014 0505   CREATININE 3.44* 11/19/2013 0948   CALCIUM 8.2* 07/16/2014 0505   PROT 6.5 07/03/2014 1200   ALBUMIN 3.7 07/03/2014 1200   AST 19 07/03/2014 1200   ALT 14 07/03/2014 1200   ALKPHOS 58 07/03/2014 1200   BILITOT 1.0 07/03/2014 1200   GFRNONAA 19* 07/16/2014 0505   GFRAA 21* 07/16/2014 0505       Component Value Date/Time   WBC 3.4* 07/16/2014 0505   WBC 3.6* 07/15/2014 0550   WBC 4.8 07/13/2014 0543   HGB 10.9* 07/16/2014 0505   HGB 10.8* 07/15/2014 0550   HGB 11.1* 07/13/2014 0543   HCT 34.9* 07/16/2014 0505   HCT 33.8* 07/15/2014 0550   HCT 35.4* 07/13/2014 0543   MCV 99.1 07/16/2014 0505   MCV 99.1 07/15/2014 0550   MCV 98.9 07/13/2014 0543    Lipid Panel     Component Value Date/Time   CHOL 118 11/19/2013 0948   TRIG 89 11/19/2013 0948   HDL 39* 11/19/2013 0948   CHOLHDL 3.0 11/19/2013 0948    VLDL 18 11/19/2013 0948   LDLCALC 61 11/19/2013 0948    ABG    Component Value Date/Time   TCO2 21 10/24/2012 0718     Lab Results  Component Value Date   TSH 4.674* 09/26/2012   BNP (last 3 results)  Recent Labs  07/03/14 1200  BNP 740.0*    ProBNP (last 3 results)  Recent Labs  04/09/14 1038  PROBNP 9656.0*    Cardiac Panel (last 3 results) No results for input(s): CKTOTAL, CKMB, TROPONINI, RELINDX in the last 72 hours.  Iron/TIBC/Ferritin/ %Sat No results found for: IRON, TIBC, FERRITIN, IRONPCTSAT   EKG Orders placed or performed during the hospital encounter of 07/12/14  . EKG 12-Lead  . EKG 12-Lead     Prior Assessment and Plan Problem List as of 07/19/2014      Cardiovascular and Mediastinum   Atrial fibrillation, chronic   Last Assessment & Plan 02/03/2012 Office Visit Written 02/03/2012  5:01 PM by Kathlen Brunswick, MD    Heart rate is well controlled.  Patient does not report symptoms attributable to his atrial arrhythmia.  Current strategy of anticoagulation and heart rate control will be pursued.      AAA (abdominal aortic aneurysm)   Last Assessment & Plan 12/12/2013 Office Visit Written 12/12/2013 10:54 AM by Chuck Hint, MD    The patient has a stable 5.2 cm infrarenal abdominal aortic aneurysm. He is almost 81 years old. I've explained that in a normal risk patient we would consider elective repair at 5.5 cm. Given his age, and somewhat debilitated state, I would probably wait even longer in his case. Regardless, the aneurysm has remained stable in size. I have ordered a follow up ultrasound in 6 months and I will see him back at that time. Fortunately he is not a smoker. His blood pressure has been under good control.      White coat hypertension   Last Assessment & Plan 02/03/2012 Office Visit Written 02/03/2012  5:04 PM by Kathlen Brunswick, MD    Blood pressure is well controlled at this visit.  Current medications will be  continued.      Thoracic aortic aneurysm   Aneurysm of peripheral artery   Hypotension   Congestive heart failure   CHF (congestive heart failure)   Systolic CHF, chronic   HTN (hypertension)   Essential hypertension   Chronic systolic heart failure     Digestive   Melena   Esophageal candidiasis   Dysphagia, pharyngoesophageal phase     Genitourinary   Chronic kidney disease, stage III (moderate)   Last Assessment & Plan 02/03/2012 Office Visit Edited 02/07/2012 11:32 AM by Kathlen Brunswick, MD    Renal disease is slowly progressive, with an increase from 2.0 to 2.4 over the past 4 years.  At this rate, there is hope that patient will not require renal replacement therapy.  It is not clear that he would be interested in undergoing dialysis if required.      Nephrolithiasis   Acute renal failure   Urinary tract infection, site not specified   Acute on chronic renal failure   Kidney stones   Hydronephrosis   Renal failure   AKI (acute kidney injury)     Other   VITAMIN B12 DEFICIENCY   HYPERLIPIDEMIA   Last Assessment & Plan 02/03/2012 Office Visit Written 02/03/2012  5:02 PM by Kathlen Brunswick, MD    Excellent control of hyperlipidemia with current therapy, which will be continued.      Gout   Chronic anticoagulation   Anemia   Sepsis   Nonspecific abnormal finding in stool contents   Encounter for therapeutic drug monitoring   Insomnia   Generalized weakness   Thrombocytopenia   Hyperkalemia   Warfarin anticoagulation       Imaging: Ct Abdomen Pelvis Wo Contrast  07/03/2014  CLINICAL DATA:  Multiple change out sub nephrostomy tube. Renal failure. Renal calculi. Infrarenal aortic aneurysm.  EXAM: CT ABDOMEN AND PELVIS WITHOUT CONTRAST  TECHNIQUE: Multidetector CT imaging of the abdomen and pelvis was performed following the standard protocol without IV contrast.  COMPARISON:  06/13/2013  FINDINGS: Lower chest: Marked cardiomegaly with extensive coronary  artery atherosclerosis. Aortic valve calcification.  Large right pleural effusion with passive atelectasis. Mild airway thickening in the left lower lobe.  Hepatobiliary: Stable hypodense lesion in the dome of segment 2 of the liver, proximally 9 mm in diameter on image 16 series 2. Cholecystectomy.  Pancreas: Unremarkable  Spleen: Unremarkable  Adrenals/Urinary Tract: Thin, atrophic right renal parenchyma. Right nephrostomy tube in place coiled in the lower pole collecting system, with a small amount of gas in the collecting system but without overt hydronephrosis. Fluid stranding in the perirenal spaces and renal hilar spaces appears commensurate with the generalized mesenteric, omental, and subcutaneous edema indicating third spacing of fluid. A 3 mm right UPJ calculus is present, image 40 series 2. Vascular calcifications are present along the right renal hilum.  Scattered exophytic lesions of both kidneys of varying complexity are again observed common non having demonstrated worrisome increase in size since the exam from 1 year ago.  On the left, there is hydronephrosis and a flaccid and distended renal collecting system along with several proximal ureteral calculi including a 3 mm calculus on image 49 of series 2 and a 4 mm calculus on image 52 of series 2. The ureter is difficult to follow due to the surrounding fluid. Urinary bladder unremarkable.  Stomach/Bowel: Descending and sigmoid colon diverticulosis.  Vascular/Lymphatic: Extensive atherosclerotic calcification.  Fusiform infrarenal abdominal aortic aneurysm, 5.6 cm anterior - posterior by 5.4 cm transverse (formerly 5.2 by 5.4 cm). The aneurysm extends into the common iliac vessels which are both dilated.  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  Reproductive: Unremarkable  Other: Diffuse subcutaneous,  mesenteric and, and omental edema compatible with third spacing of fluid. Large right pleural effusion and scattered ascites along with presacral edema and bilateral hydroceles extending into the fat containing inguinal hernias. The right inguinal hernia contains a loop of small bowel without findings of strangulation or obstruction. There is subcutaneous edema along the pubis.  Musculoskeletal: Bridging spurring of both sacroiliac joints.  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 There is grade 1 retrolisthesis at L3-4 and grade 1 anterolisthesis at L4-5.  Healing left ninth and tenth distal rib fractures noted.  IMPRESSION: 1. No right hydronephrosis. Abnormal atrophic thinning of both kidneys with scattered complex cysts of both kidneys. The right nephrostomy tube coils in the right kidney lower pole collecting system. 2. Left hydronephrosis with dilated/flaccid collecting system and several proximal ureteral calculi. There is also a small right kidney UPJ stone. 3. Extensive third spacing of fluid including ascites, large right pleural effusion, and subcutaneous, mesenteric, and omental edema. 4. Mild enlargement of the infrarenal abdominal aortic aneurysm. 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 5. Descending and sigmoid colon diverticulosis without active diverticulitis. 6. Severe lumbar spondylosis and degenerative disc disease with multilevel impingement. 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 7.  The right inguinal hernia contains a loop of small bowel without strangulation or obstruction. 8. Healing left ninth and tenth rib fractures.   Electronically Signed   By: Gaylyn Rong M.D.   On: 07/03/2014 18:34   Dg Chest 1 View  07/12/2014   CLINICAL DATA:  Congestive  Heart Failure; congested cough; no other complaints from pt  EXAM: CHEST  1 VIEW  COMPARISON:  07/12/2014 at 11:36 a.m.  FINDINGS: Moderate 2 large right-sided pleural effusion is without significant change from the prior exam allowing for the semi-erect positioning on the current study. Associated right lung base opacity, likely atelectasis, is also stable. No convincing pulmonary edema. No left pleural effusion.  Stable cardiomegaly.  No pneumothorax.  IMPRESSION: No convincing change from the radiograph obtained earlier the same date. Moderate to large right pleural effusion with associated right lung base opacity, most likely atelectasis. No convincing pulmonary edema.   Electronically Signed   By: Amie Portland M.D.   On: 07/12/2014 21:50   Dg Chest 2 View  07/12/2014   CLINICAL DATA:  Irregular heartbeat  EXAM: CHEST  2 VIEW  COMPARISON:  07/03/2014  FINDINGS: Persistent changes in the right lung base are noted consistent with effusion and underlying atelectasis/ infiltrate. Postsurgical changes are again seen. The cardiac shadow is enlarged. The left lung remains clear.  IMPRESSION: Persistent right-sided effusion and basilar atelectasis/infiltrate. The overall appearance is stable from the prior exam.   Electronically Signed   By: Alcide Clever M.D.   On: 07/12/2014 14:13   Dg Chest Portable 1 View  07/03/2014   CLINICAL DATA:  Fatigue since January, 2016.  EXAM: PORTABLE CHEST - 1 VIEW  COMPARISON:  PA and lateral chest 04/09/2014 and 06/06/2013.  FINDINGS: Small right effusion and basilar airspace disease have worsened since the most recent examination. No left pleural effusion is identified. There is cardiomegaly without edema.  IMPRESSION: Worsened right effusion and basilar airspace disease.   Electronically Signed   By: Drusilla Kanner M.D.   On: 07/03/2014 12:29

## 2014-07-19 NOTE — Progress Notes (Signed)
Cardiology Office Note   Date:  07/19/2014   ID:  [name], DOB 22-Feb-1926, MRN [id number]  PCP:  Edward Asa, MD  Cardiologist:  Edward Calix, NP   Chief Complaint  Patient presents with  . Atrial Fibrillation  . Congestive Heart Failure  . Hypertension      History of Present Illness: [name] is a 81 y.o. male who presents for ongoing assessment and management of chronic atrial fibrillation, CHADS VASC score of 3, chronic systolic heart failure, most recent EF of 35-40%, with diffuse hypokinesis during hospitalization in March of 2016, hypertension, with chronic kidney disease.  He is here for post hospitalization followup.  The patient was also found to have bacteremia and started on empiric antibiotic therapy for this during hospitalization.  He is a difficult patient, with a lot of medical issues.  He has been followed by nephrology, [name], who has said that he does not have kidney stones,but he does hav exterior calcifications outside of his kidney.  He has not [name].  He does not like coming to have his INR checked.  He has been placed on a short dose of antibiotics Vantin for UTI.  He comes today with multiple complaints.  He also appears depressed stating that he is ready to die.  He is with a family member, who will be leaving town and he lives alone.   Past Medical History  Diagnosis Date  . Hyperlipidemia   . White coat hypertension   . Cardiomyopathy      EF:30-40% in 06/1998, but 50-55% in 2009;  HX ISCHEMIC CARDIOMYOPATHY  . Chronic atrial fibrillation   . Vitamin B12 deficiency   . Chronic anticoagulation   . Arthritis   . Sensory neuropathy   . Chronic kidney disease, stage III (moderate)     creatinine 1.5 2001, 2.0 2009, 2.28 in 12/2009  . Mild renal insufficiency   . Hydronephrosis, bilateral   . Ureteral calculi     BILATERAL  . History of CHF (congestive heart failure)     1999  &  2005  . History of kidney stones   .  Bilateral renal cysts   . Aneurysm of infrarenal abdominal aorta 3.9 x 3.6 no change 8/05 no change in 2010;  4.7 in 2013    MONITORED BY [name]-- LAST VISIT OCT 2013--  4.7CM  . Chronic venous insufficiency   . PAD (peripheral artery disease)   . Edema of both legs   . History of gout     per pt stable  . Glaucoma of both eyes   . Wears hearing aid     bilateral  . CHF (congestive heart failure)   . Shortness of breath dyspnea   . Arrhythmia     Afib CHADS VASC Score of 5.    Past Surgical History  Procedure Laterality Date  . Repair thoracic aorta  01/1995    s/p rupture  . Cataract extraction w/ intraocular lens  implant, bilateral  2012  . Endovascular right hypogastric artery aneurysm repair with graft  08-12-2003  DR [name]  . Laparoscopic cholecystectomy  JAN 2005  . Percutaneous nephrostolithotomy  1970's  . Abdominal aortagram  07-16-2003  DR [name]    W/ COIL EMBOLIZATION OF SIX BRANCHES OF RIGHT INTERNAL RENAL ARTERY ANEURYSM  . Cardiovascular stress test  04-25-2003    LOW RISK CARDIOLITE STUDY/ MODERATE LV DILATATION AND IMPAIRMENT OF LVSF/  NO ISCHEMIA  . Transthoracic echocardiogram  02-07-2008  DR [name]    MODERATE DILATED LV/  EF 50-55%/ MILD AR/ MILD TO MODERATE AORTIC ROOT DILATATION/ MODERATE MR/ MODERATE LA   &  RA   DIILATED/ MILD TO MODERATE TR  . Cystoscopy w/ ureteral stent placement Bilateral 10/24/2012    Procedure: CYSTOSCOPY WITH RETROGRADE PYELOGRAM/URETERAL STENT PLACEMENT;  Surgeon: Edward Slough, MD;  Location: [facility];  Service: Urology;  Laterality: Bilateral;  . Cystoscopy with ureteroscopy Left 11/27/2012    Procedure: CYSTOSCOPY,LEFT URETEROSCOPY WITH LASER LITHOTRIPSY/REMOVAL OF MIGRATED STENT, PLACEMENT OF LEFT STENT;  Surgeon: Edward Farm, MD;  Location: [facility];  Service: Urology;  Laterality: Left;  . Holmium laser application Left 11/27/2012    Procedure: HOLMIUM LASER APPLICATION;  Surgeon: Edward Farm, MD;   Location: [facility];  Service: Urology;  Laterality: Left;  . Cystoscopy with ureteroscopy and stent placement N/A 12/29/2012    Procedure: CYSTOSCOPY WITH URETEROSCOPY AND STENT PLACEMENT, removal of right and left stents, retrogrades, right stent exchange;  Surgeon: Edward Farm, MD;  Location: [facility];  Service: Urology;  Laterality: N/A;  . Holmium laser application Right 12/29/2012    Procedure: HOLMIUM LASER APPLICATION;  Surgeon: Edward Farm, MD;  Location: [facility];  Service: Urology;  Laterality: Right;  HLL OF (RT) URETERAL PELVIC JUNCTION STONE  . Cystoscopy with retrograde pyelogram, ureteroscopy and stent placement Right 01/29/2013    Procedure: CYSTOSCOPY WITH RIGHT URETEROSCOPY AND STENT PLACEMENT;  Surgeon: Edward Farm, MD;  Location: [facility];  Service: Urology;  Laterality: Right;  . Holmium laser application N/A 01/29/2013    Procedure: HOLMIUM LASER APPLICATION;  Surgeon: Edward Farm, MD;  Location: [facility];  Service: Urology;  Laterality: N/A;  . Esophagogastroduodenoscopy N/A 02/16/2013    Procedure: ESOPHAGOGASTRODUODENOSCOPY (EGD);  Surgeon: Edward Dare, MD;  Location: [facility];  Service: Endoscopy;  Laterality: N/A;  . Cystoscopy with retrograde pyelogram, ureteroscopy and stent placement Right 05/07/2013    Procedure: CYSTOSCOPY WITH RIGHT RETROGRADE PYELOGRAM, Balloon dilation of right ureter, Laser incision of right ureter, Nephrostogram;  Surgeon: Edward Farm, MD;  [facility]: [facility];  Service: Urology;  Laterality: Right;  . Holmium laser application Right 05/07/2013    Procedure: HOLMIUM LASER APPLICATION;  Surgeon: Edward Farm, MD;  Location: [facility];  Service: Urology;  Laterality: Right;  . Cystoscopy with retrograde pyelogram, ureteroscopy and stent placement Left 07/15/2014    Procedure: LEFT URETEROSCOPY RETROGRADE PYELOGRAM TRANSECTION OF BLADDER TUMOR WITH BLADDER LESION BIOPSIES;  Surgeon: Edward Gully, MD;  Location: [facility];  Service: Urology;  Laterality:  Left;     Current Outpatient Prescriptions  Medication Sig Dispense Refill  . acetaminophen (TYLENOL) 500 MG tablet Take 1,000 mg by mouth every 6 (six) hours as needed for mild pain, moderate pain or headache.     . cefpodoxime (VANTIN) 100 MG tablet Take 1 tablet (100 mg total) by mouth 2 (two) times daily. 7 tablet 0  . colchicine (COLCRYS) 0.6 MG tablet TAKE ONE TABLET BY MOUTH 2 TIMES A DAY FOR GOUT. (Patient taking differently: Take 0.6 mg by mouth daily. ) 24 tablet 11  . Cyanocobalamin (VITAMIN B-12) 2000 MCG TBCR Take 2,000 mcg by mouth daily.     . furosemide (LASIX) 40 MG tablet Take 1 and 1/2 tablets by  mouth every morning (Patient taking differently: take 1 tablet by mouth every morning.) 135 tablet 0  . latanoprost (XALATAN) 0.005 % ophthalmic solution Place 1 drop into both eyes at bedtime.     Marland Kitchen  losartan (COZAAR) 50 MG tablet Take 0.5 tablets (25 mg total) by mouth every morning. 30 tablet 0  . metoprolol (LOPRESSOR) 100 MG tablet Take 0.5 tablets (50 mg total) by mouth 2 (two) times daily. 120 tablet 10  . simvastatin (ZOCOR) 20 MG tablet Take 20 mg by mouth daily.    Marland Kitchen warfarin (COUMADIN) 5 MG tablet Take 5 mg by mouth See admin instructions. Per 3/7 visit, Hold coumadin 3/7 and 3/8. Take 1/2 on Wednesday 3/9,  then resume 1/2 tablet daily except none on Wednesdays    . zolpidem (AMBIEN) 10 MG tablet TAKE 1 TABLET AT BEDTIME AS NEEDED FOR SLEEP. (Patient taking differently: Take 10 mg by mouth at bedtime as needed for sleep. TAKE 1 TABLET AT BEDTIME AS NEEDED FOR SLEEP.) 90 tablet 1   No current facility-administered medications for this visit.    Allergies:   Sulfa antibiotics    Social History:  The patient  reports that he has never smoked. He has never used smokeless tobacco. He reports that he does not drink alcohol or use illicit drugs.   Family History:  The patient's family history includes Aneurysm in his brother; Aortic aneurysm in his brother; Diabetes in his  sister; Early death in his father; Heart disease in his mother.     ROS: .   All other systems are reviewed and negative.Unless otherwise mentioned in H&P above.   PHYSICAL EXAM: VS:  There were no vitals taken for this visit. , BMI There is no weight on file to calculate BMI. GEN: Well nourished, well developed, in no acute distress HEENT: normal Neck: no JVD, carotid bruits, or masses Cardiac: IRRR;tachycardic, no murmurs, rubs, or gallops,no edema  Respiratory:  Bilateral crackles. No coughing. GI: soft, nontender, nondistended, + BS MS: no deformity or atrophy1+ edema in the lower left pretibial area Skin: warm and dry, no rash Neuro:  Strength and sensation are intact Psych: Flat affect, appears depressed, argumentative.  EKG:  The ekg ordered today demonstrates Atrial fibrillation, with rate of 105 beats per minute with inferior, anterior, lateral ST depression.   Recent Labs: 04/09/2014: Pro B Natriuretic peptide (BNP) 9656.0* 07/03/2014: ALT 14; B Natriuretic Peptide 740.0* 07/16/2014: BUN 72*; Creatinine 2.86*; Hemoglobin 10.9*; Platelets 83*; Potassium 5.0; Sodium 138    Lipid Panel    Component Value Date/Time   CHOL 118 11/19/2013 0948   TRIG 89 11/19/2013 0948   HDL 39* 11/19/2013 0948   CHOLHDL 3.0 11/19/2013 0948   VLDL 18 11/19/2013 0948   LDLCALC 61 11/19/2013 0948      Wt Readings from Last 3 Encounters:  07/12/14 193 lb (87.544 kg)  07/04/14 189 lb 6 oz (85.9 kg)  07/01/14 193 lb 9.6 oz (87.816 kg)        ASSESSMENT AND PLAN:  1. Atrial fib with RVR: I will increase his metoprolol to 100 mg twice a day as he was taking prior to hospitalization.  I will discontinue losartan to avoid hypotension.  Would like to see him back in the office in 2 weeks.  He is reluctant to come back as he will not have transportation.  I am referring him to [facility] for home evaluation of medications, vital signs, INR checks.  I discussed with him possibility of skilled nursing  facility or assisted living.  He is adamant against this.  2. Hypertension: Hypotensive on this visit.  Likely related to rapid heart rhythm. As stated above, will hold losartan, increase the Toprol to 100 mg  twice a day.  Like to see him again in 2 weeks for reevaluation.  He was taken off of amlodipine on discharge.  3.Systolic Dysfunction: Most recent ejection fraction was calculated at 50% during recent hospitalization.  He continues on Lasix.most recent creatinine 3.4.  4.  As stated, will discontinue ARB.  We will see him again in 2 weeks if he is willing to return.  Otherwise, he will be evaluated by [facility].  I spent 45 minutes with this patient and family member going over his medications, explaining reasons for changes, checking INR, and planning followup.  The patient appears very depressed, I am concerned about his well-being being at home, alone with a family member leaving town.  He is advised to come to the emergency room if he feels worse.  I am having [facility] see him as soon as possible.  He is very depressed, stating that he is ready to die.  He does not wish to be placed in a skilled nursing facility or assisted living.  He is to followup with Dr. Gerda Diss on Monday.  I have advised him to keep that appointment   Current medicines are reviewed at length with the patient today.   No orders of the defined types were placed in this encounter.     Disposition:   FU with 2 weeks.   Signed, Joni Reining, NP  07/19/2014 7:28 AM    [facility] [address] [place], [place] [address] Phone: [phone]; Fax: [phone]

## 2014-07-19 NOTE — Patient Instructions (Signed)
Your physician recommends that you schedule a follow-up appointment in: 2 weeks with Joni Reining, NP.   Your physician has recommended you make the following change in your medication:   Stop taking Losartan  Increase Metoprolol to 100 mg two times Daily  You have been referred to [facility]    Thank you for choosing [facility]!

## 2014-07-22 ENCOUNTER — Ambulatory Visit: Payer: Medicare Other | Admitting: Family Medicine

## 2014-07-22 NOTE — Addendum Note (Signed)
Addended by: Marlyn Corporal A on: 07/22/2014 08:59 AM   Modules accepted: Orders

## 2014-07-23 ENCOUNTER — Other Ambulatory Visit (HOSPITAL_COMMUNITY): Payer: Self-pay | Admitting: Urology

## 2014-07-23 ENCOUNTER — Ambulatory Visit (HOSPITAL_COMMUNITY)
Admission: RE | Admit: 2014-07-23 | Discharge: 2014-07-23 | Disposition: A | Discharge status: Home / Self Care | Payer: Medicare Other | Source: Ambulatory Visit | Attending: Interventional Radiology | Admitting: Interventional Radiology

## 2014-07-23 DIAGNOSIS — N135 Crossing vessel and stricture of ureter without hydronephrosis: Secondary | ICD-10-CM | POA: Diagnosis not present

## 2014-07-23 DIAGNOSIS — Z436 Encounter for attention to other artificial openings of urinary tract: Secondary | ICD-10-CM | POA: Diagnosis present

## 2014-07-23 MED ORDER — LIDOCAINE HCL 1 % IJ SOLN
INTRAMUSCULAR | Status: AC
  Administered 2014-07-23: 5 mL
  Filled 2014-07-23: qty 20

## 2014-07-23 MED ORDER — IOHEXOL 300 MG/ML  SOLN
10.0000 mL | Freq: Once | INTRAMUSCULAR | Status: AC | PRN
  Administered 2014-07-23: 10 mL

## 2014-07-24 ENCOUNTER — Other Ambulatory Visit: Payer: Self-pay | Admitting: *Deleted

## 2014-07-25 ENCOUNTER — Telehealth: Payer: Self-pay | Admitting: Gastroenterology

## 2014-07-25 ENCOUNTER — Other Ambulatory Visit: Payer: Self-pay | Admitting: *Deleted

## 2014-07-25 ENCOUNTER — Telehealth: Payer: Self-pay | Admitting: Family Medicine

## 2014-07-25 ENCOUNTER — Encounter: Payer: Self-pay | Admitting: Gastroenterology

## 2014-07-25 ENCOUNTER — Ambulatory Visit: Payer: Medicare Other | Admitting: Gastroenterology

## 2014-07-25 MED ORDER — TRAZODONE HCL 50 MG PO TABS: 25.0000 mg | ORAL_TABLET | Freq: Every day | ORAL | Status: AC

## 2014-07-25 NOTE — Telephone Encounter (Signed)
[name] is not covered by the patient's insurance. Trazodone is. Can we try that?

## 2014-07-25 NOTE — Telephone Encounter (Signed)
Patient said that he has not slept in 3 days and its driving him crazy.  He wants to know if we can prescribe him something to help him sleep that does not involve ambien?   [employer]

## 2014-07-25 NOTE — Telephone Encounter (Signed)
PATIENT WAS A NO SHOW 07/25/14 AND LETTER WAS SENT °

## 2014-07-25 NOTE — Telephone Encounter (Signed)
Patient notified and verbalized understanding. Med sent. Told pt to call back if he has any problems.

## 2014-07-25 NOTE — Telephone Encounter (Signed)
trazadone 25 qhs thirty six ref

## 2014-07-25 NOTE — Telephone Encounter (Signed)
silenor 3 mg one qhs thirty five ref

## 2014-08-01 ENCOUNTER — Telehealth: Payer: Self-pay | Admitting: Family Medicine

## 2014-08-01 NOTE — Telephone Encounter (Signed)
Pts family states that he missed his appt, they have rescheduled For 4/26 but they think that is too far out due to his difficulty with swallowing   Please advise, he was sent to [facility]     [phone]

## 2014-08-02 ENCOUNTER — Ambulatory Visit (HOSPITAL_COMMUNITY)
Admission: RE | Admit: 2014-08-02 | Discharge: 2014-08-02 | Disposition: A | Discharge status: Home / Self Care | Payer: Medicare Other | Source: Ambulatory Visit | Attending: Adult Health | Admitting: Adult Health

## 2014-08-02 ENCOUNTER — Ambulatory Visit (INDEPENDENT_AMBULATORY_CARE_PROVIDER_SITE_OTHER): Payer: Medicare Other | Admitting: Adult Health

## 2014-08-02 ENCOUNTER — Encounter: Payer: Self-pay | Admitting: Adult Health

## 2014-08-02 VITALS — BP 93/60 | HR 65 | Ht 70.0 in | Wt 189.0 lb

## 2014-08-02 DIAGNOSIS — I517 Cardiomegaly: Secondary | ICD-10-CM | POA: Diagnosis not present

## 2014-08-02 DIAGNOSIS — J90 Pleural effusion, not elsewhere classified: Secondary | ICD-10-CM | POA: Diagnosis present

## 2014-08-02 DIAGNOSIS — R0602 Shortness of breath: Secondary | ICD-10-CM | POA: Diagnosis not present

## 2014-08-02 NOTE — Progress Notes (Signed)
Cardiology Office Note   Date:  08/02/2014   ID:  [name], DOB 22-May-1925, MRN [id number]  PCP:  [name], MD  Cardiologist:  Arlington Calix, NP   Chief Complaint  Patient presents with  . Atrial Fibrillation    CHADS VASC Score of 3  . Congestive Heart Failure    Systolic EFof 35%-40%  . Hypertension      History of Present Illness: [name] is a 81 y.o. male who presents for ongoing assessment and management of chronic atrial fibrillation, chronic systolic heart failure, most recent echo, revealing an EF of 35% to 40% with diffuse hypokinesis during recent hospitalization in March of 2016.  Other history includes hypertension.  [name] has a difficult and lengthy past medical history.  He was last seen in the office on 07/19/2014, with multiple complaints, along with depression, stating, "I am ready to die."  At the last office visit on 07/19/2014, his metoprolol was increased to 100 mg twice a day, losartan was discontinued to avoid hypotension, he was also being referred [facility] for home evaluation and medications.  Vital signs INR checks.  I discussed with him the possibility of skilled nursing facility or assisted living, but he was against this.  He was also found to be mildly hypertensive on last office visit.  He is here for followup concerning his response to medication changes.  He assay, with multiple complaints.  His main complaint is ear pain, left.  He is very depressed.  Still stating he wants to die.  He is not eating well.  Family member, who is with him its making sure he gets his medicines daily.  He was due to see an ENT but he refuses to go that day because he was not feeling well.  He denies rapid palpitations, or shortness of breath.    Past Medical History  Diagnosis Date  . Hyperlipidemia   . White coat hypertension   . Cardiomyopathy      EF:30-40% in 06/1998, but 50-55% in 2009;  HX ISCHEMIC CARDIOMYOPATHY  . Chronic atrial fibrillation    . Vitamin B12 deficiency   . Chronic anticoagulation   . Arthritis   . Sensory neuropathy   . Chronic kidney disease, stage III (moderate)     creatinine 1.5 2001, 2.0 2009, 2.28 in 12/2009  . Mild renal insufficiency   . Hydronephrosis, bilateral   . Ureteral calculi     BILATERAL  . History of CHF (congestive heart failure)     1999  &  2005  . History of kidney stones   . Bilateral renal cysts   . 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 Chronic venous insufficiency   . PAD (peripheral artery disease)   . Edema of both legs   . History of gout     per pt stable  . Glaucoma of both eyes   . Wears hearing aid     bilateral  . CHF (congestive heart failure)   . Shortness of breath dyspnea   . Arrhythmia     Afib CHADS VASC Score of 5.    Past Surgical History  Procedure Laterality Date  . Repair thoracic aorta  01/1995    s/p rupture  . Cataract extraction w/ intraocular lens  implant, bilateral  2012  . Endovascular right hypogastric  artery aneurysm repair with graft  08-12-2003  DR DICKSON  . Laparoscopic cholecystectomy  JAN 2005  . Percutaneous nephrostolithotomy  1970's  . Abdominal aortagram  07-16-2003  DR ROTHBART    W/ COIL EMBOLIZATION OF SIX BRANCHES OF RIGHT INTERNAL RENAL ARTERY ANEURYSM  . Cardiovascular stress test  04-25-2003    LOW RISK CARDIOLITE STUDY/ MODERATE LV DILATATION AND IMPAIRMENT OF LVSF/  NO ISCHEMIA  . Transthoracic echocardiogram  02-07-2008  DR ROTHBART    MODERATE DILATED LV/  EF 50-55%/ MILD AR/ MILD TO MODERATE AORTIC ROOT DILATATION/ MODERATE MR/ MODERATE LA   &  RA   DIILATED/ MILD TO MODERATE TR  . Cystoscopy w/ ureteral stent placement Bilateral 10/24/2012    Procedure: CYSTOSCOPY WITH RETROGRADE PYELOGRAM/URETERAL STENT PLACEMENT;  Surgeon: Lindaann Slough, MD;  Location: [facility] ;  Service: Urology;   Laterality: Bilateral;  . Cystoscopy with ureteroscopy Left 11/27/2012    Procedure: CYSTOSCOPY,LEFT URETEROSCOPY WITH LASER LITHOTRIPSY/REMOVAL OF MIGRATED STENT, PLACEMENT OF LEFT STENT;  Surgeon: Garnett Farm, MD;  Location: [facility];  Service: Urology;  Laterality: Left;  . Holmium laser application Left 11/27/2012    Procedure: HOLMIUM LASER APPLICATION;  Surgeon: Garnett Farm, MD;  Location: [facility];  Service: Urology;  Laterality: Left;  . Cystoscopy with ureteroscopy and stent placement N/A 12/29/2012    Procedure: CYSTOSCOPY WITH URETEROSCOPY AND STENT PLACEMENT, removal of right and left stents, retrogrades, right stent exchange;  Surgeon: Garnett Farm, MD;  Location: [facility];  Service: Urology;  Laterality: N/A;  . Holmium laser application Right 12/29/2012    Procedure: HOLMIUM LASER APPLICATION;  Surgeon: Garnett Farm, MD;  Location: [facility];  Service: Urology;  Laterality: Right;  HLL OF (RT) URETERAL PELVIC JUNCTION STONE  . Cystoscopy with retrograde pyelogram, ureteroscopy and stent placement Right 01/29/2013    Procedure: CYSTOSCOPY WITH RIGHT URETEROSCOPY AND STENT PLACEMENT;  Surgeon: Garnett Farm, MD;  [facility]: [facility];  Service: Urology;  Laterality: Right;  . Holmium laser application N/A 01/29/2013    Procedure: HOLMIUM LASER APPLICATION;  Surgeon: Garnett Farm, MD;  [facility];  Service: Urology;  Laterality: N/A;  . Esophagogastroduodenoscopy N/A 02/16/2013    Procedure: ESOPHAGOGASTRODUODENOSCOPY (EGD);  Surgeon: Meryl Dare, MD;  Location: [facility];  Service: Endoscopy;  Laterality: N/A;  . Cystoscopy with retrograde pyelogram, ureteroscopy and stent placement Right 05/07/2013    Procedure: CYSTOSCOPY WITH RIGHT RETROGRADE PYELOGRAM, Balloon dilation of right ureter, Laser incision of right ureter, Nephrostogram;  Surgeon: Garnett Farm, MD;  [facility];  Service: Urology;  Laterality: Right;  . Holmium laser application Right 05/07/2013    Procedure:  HOLMIUM LASER APPLICATION;  Surgeon: Garnett Farm, MD;  Location: [facility];  Service: Urology;  Laterality: Right;  . Cystoscopy with retrograde pyelogram, ureteroscopy and stent placement Left 07/15/2014    Procedure: LEFT URETEROSCOPY RETROGRADE PYELOGRAM TRANSECTION OF BLADDER TUMOR WITH BLADDER LESION BIOPSIES;  Surgeon: Ihor Gully, MD;  Location: [facility];  Service: Urology;  Laterality: Left;     Current Outpatient Prescriptions  Medication Sig Dispense Refill  . acetaminophen (TYLENOL) 500 MG tablet Take 1,000 mg by mouth every 6 (six) hours as needed for mild pain, moderate pain or headache.     Marland Kitchen amLODipine (NORVASC) 2.5 MG tablet Take 2.5 mg by mouth daily.    . cefpodoxime (VANTIN) 100 MG tablet Take 1 tablet (100 mg total) by mouth 2 (two) times daily. 7 tablet 0  .  colchicine (COLCRYS) 0.6 MG tablet TAKE ONE TABLET BY MOUTH 2 TIMES A DAY FOR GOUT. (Patient taking differently: Take 0.6 mg by mouth daily. ) 24 tablet 11  . Cyanocobalamin (VITAMIN B-12) 2000 MCG TBCR Take 2,000 mcg by mouth daily.     . furosemide (LASIX) 40 MG tablet Take 1 and 1/2 tablets by  mouth every morning (Patient taking differently: take 1 tablet by mouth every morning.) 135 tablet 0  . latanoprost (XALATAN) 0.005 % ophthalmic solution Place 1 drop into both eyes at bedtime.     . metoprolol (LOPRESSOR) 100 MG tablet Take 1 tablet (100 mg total) by mouth 2 (two) times daily. 180 tablet 3  . simvastatin (ZOCOR) 20 MG tablet Take 20 mg by mouth daily.    . traZODone (DESYREL) 50 MG tablet Take 0.5 tablets (25 mg total) by mouth at bedtime. 15 tablet 5  . warfarin (COUMADIN) 5 MG tablet Take 5 mg by mouth See admin instructions. Per 3/7 visit, Hold coumadin 3/7 and 3/8. Take 1/2 on Wednesday 3/9,  then resume 1/2 tablet daily except none on Wednesdays     No current facility-administered medications for this visit.    Allergies:   Sulfa antibiotics    Social History:  The patient  reports that he has never  smoked. He has never used smokeless tobacco. He reports that he does not drink alcohol or use illicit drugs.   Family History:  The patient's family history includes Aneurysm in his brother; Aortic aneurysm in his brother; Diabetes in his sister; Early death in his father; Heart disease in his mother.    ROS: .   All other systems are reviewed and negative.Unless otherwise mentioned in  H&P above.   PHYSICAL EXAM: VS:  There were no vitals taken for this visit. , BMI There is no weight on file to calculate BMI. GEN: Well nourished, well developed, in no acute distress HEENT: normal Neck: no JVD, carotid bruits, or masses Cardiac: IRRR; no murmurs, rubs, or gallops,no edema  Respiratory:  Clear to auscultation bilaterally, normal work of breathing GI: soft, nontender, nondistended, + BS MS: no deformity or atrophy2+ pitting edema on the left 1+ pitting edema on the right Skin: warm and dry, no rash Neuro:  Strength and sensation are intact Psych: Depressed  Recent Labs: 04/09/2014: Pro B Natriuretic peptide (BNP) 9656.0* 07/03/2014: ALT 14; B Natriuretic Peptide 740.0* 07/16/2014: BUN 72*; Creatinine 2.86*; Hemoglobin 10.9*; Platelets 83*; Potassium 5.0; Sodium 138    Lipid Panel    Component Value Date/Time   CHOL 118 11/19/2013 0948   TRIG 89 11/19/2013 0948   HDL 39* 11/19/2013 0948   CHOLHDL 3.0 11/19/2013 0948   VLDL 18 11/19/2013 0948   LDLCALC 61 11/19/2013 0948      Wt Readings from Last 3 Encounters:  07/19/14 193 lb (87.544 kg)  07/12/14 193 lb (87.544 kg)  07/04/14 189 lb 6 oz (85.9 kg)      Other studies Reviewed: Additional studies/ records that were reviewed today include: None Review of the above records demonstrates: Nothing new   ASSESSMENT AND PLAN:  1.Atrial fib: HR much better controlled.  He will continue Coumadin as directed.  He is a little hypotensive today and has been rechecked with manual cuff.  Will discontinue amlodipine 2.5 mg.  He does  have some lower extremity edema.  I have suggested that he wear support hose.he is not taking medication as directed concerning his Lasix dose.  He is only taking  one tablet day when he should be taking 1-1/2 tablets a day.  I have advised to take his medications as directed.  2. Hypertension: he is low normal.  His been rechecked manually.  I will discontinue amlodipine as discussed above.  3.Systolic CHF:I have advised him to take his diuretic as directed and avoid salty foods.  He is eating pretty much what he wants.  He denies any shortness of breath, but does have significant lower extremity edema.  I reinforced the need to avoid salt and to take his Lasix as directed.  4. Left Ear Pain:  He is to followup with ENT and rescheduled the appointment.   Current medicines are reviewed at length with the patient today.    Labs/ tests ordered today include: None No orders of the defined types were placed in this encounter.     Disposition:   FU with 6 months  Signed, Joni Reining, NP  08/02/2014 7:13 AM    [facility] [address] [place], [place] [address] Phone: [phone]; Fax: [phone]

## 2014-08-02 NOTE — Progress Notes (Signed)
Name: [name]    DOB: [birth year]  Age: 81 y.o.  MR#: [id number]       PCP:  [name], MD      Insurance: Payor: MEDICARE / Plan: MEDICARE PART A AND B / Product Type: *No Product type* /   CC:    Chief Complaint  Patient presents with  . Atrial Fibrillation    CHADS VASC Score of 3  . Congestive Heart Failure    Systolic EFof 35%-40%  . Hypertension    VS Filed Vitals:   08/02/14 1423  BP: 93/60  Pulse: 65  Height: 5\' 10"  (1.778 m)  Weight: 189 lb (85.73 kg)  SpO2: 95%    Weights Current Weight  08/02/14 189 lb (85.73 kg)  07/19/14 193 lb (87.544 kg)  07/12/14 193 lb (87.544 kg)    Blood Pressure  BP Readings from Last 3 Encounters:  08/02/14 93/60  07/19/14 96/63  07/12/14 98/63     Admit date:  (Not on file) Last encounter with RMR:  07/19/2014   Allergy Sulfa antibiotics  Current Outpatient Prescriptions  Medication Sig Dispense Refill  . acetaminophen (TYLENOL) 500 MG tablet Take 1,000 mg by mouth every 6 (six) hours as needed for mild pain, moderate pain or headache.     Marland Kitchen amLODipine (NORVASC) 2.5 MG tablet Take 2.5 mg by mouth daily.    . colchicine (COLCRYS) 0.6 MG tablet TAKE ONE TABLET BY MOUTH 2 TIMES A DAY FOR GOUT. (Patient taking differently: Take 0.6 mg by mouth daily. ) 24 tablet 11  . Cyanocobalamin (VITAMIN B-12) 2000 MCG TBCR Take 2,000 mcg by mouth daily.     . furosemide (LASIX) 40 MG tablet Take 1 and 1/2 tablets by  mouth every morning (Patient taking differently: take 1 tablet by mouth every morning.) 135 tablet 0  . latanoprost (XALATAN) 0.005 % ophthalmic solution Place 1 drop into both eyes at bedtime.     . metoprolol (LOPRESSOR) 100 MG tablet Take 1 tablet (100 mg total) by mouth 2 (two) times daily. 180 tablet 3  . simvastatin (ZOCOR) 20 MG tablet Take 20 mg by mouth daily.    . traZODone (DESYREL) 50 MG tablet Take 0.5 tablets (25 mg total) by mouth at bedtime. 15 tablet 5  . warfarin (COUMADIN) 5 MG tablet Take 5 mg by mouth See  admin instructions. Per 3/7 visit, Hold coumadin 3/7 and 3/8. Take 1/2 on Wednesday 3/9,  then resume 1/2 tablet daily except none on Wednesdays     No current facility-administered medications for this visit.    Discontinued Meds:    Medications Discontinued During This Encounter  Medication Reason  . cefpodoxime (VANTIN) 100 MG tablet Error    Patient Active Problem List   Diagnosis Date Noted  . Chronic systolic heart failure   . Essential hypertension   . Warfarin anticoagulation 07/13/2014  . Systolic CHF, chronic 07/12/2014  . HTN (hypertension) 07/12/2014  . Kidney stones 07/12/2014  . Hydronephrosis 07/12/2014  . AKI (acute kidney injury) 07/12/2014  . Hyperkalemia   . Renal failure   . CHF (congestive heart failure) 07/03/2014  . Acute on chronic renal failure 07/03/2014  . Generalized weakness 07/03/2014  . Thrombocytopenia 07/03/2014  . Dysphagia, pharyngoesophageal phase 07/01/2014  . Congestive heart failure 04/14/2014  . Insomnia 04/14/2014  . Encounter for therapeutic drug monitoring 06/04/2013  . Esophageal candidiasis 02/16/2013  . Nonspecific abnormal finding in stool contents 02/13/2013  . Acute renal failure 02/12/2013  . Hypotension  02/12/2013  . Melena 02/12/2013  . Anemia 02/12/2013  . Urinary tract infection, site not specified 02/12/2013  . Sepsis 02/12/2013  . Thoracic aortic aneurysm 02/03/2011  . Aneurysm of peripheral artery 02/03/2011  . Chronic kidney disease, stage III (moderate)   . AAA (abdominal aortic aneurysm)   . White coat hypertension   . Nephrolithiasis   . Chronic anticoagulation 07/23/2010  . VITAMIN B12 DEFICIENCY 02/21/2009  . HYPERLIPIDEMIA 02/18/2009  . Gout 02/18/2009  . Atrial fibrillation, chronic 02/18/2009    LABS    Component Value Date/Time   NA 138 07/16/2014 0505   NA 139 07/15/2014 0550   NA 144 07/13/2014 0543   K 5.0 07/16/2014 0505   K 4.9 07/15/2014 0550   K 4.9 07/13/2014 0543   CL 108  07/16/2014 0505   CL 110 07/15/2014 0550   CL 113* 07/13/2014 0543   CO2 20 07/16/2014 0505   CO2 19 07/15/2014 0550   CO2 19 07/13/2014 0543   GLUCOSE 98 07/16/2014 0505   GLUCOSE 87 07/15/2014 0550   GLUCOSE 77 07/13/2014 0543   BUN 72* 07/16/2014 0505   BUN 78* 07/15/2014 0550   BUN 95* 07/13/2014 0543   CREATININE 2.86* 07/16/2014 0505   CREATININE 2.84* 07/15/2014 0550   CREATININE 3.43* 07/13/2014 0543   CREATININE 3.44* 11/19/2013 0948   CREATININE 2.01* 11/16/2012 0710   CREATININE 2.65* 10/05/2012 0725   CALCIUM 8.2* 07/16/2014 0505   CALCIUM 8.3* 07/15/2014 0550   CALCIUM 8.6 07/13/2014 0543   GFRNONAA 19* 07/16/2014 0505   GFRNONAA 19* 07/15/2014 0550   GFRNONAA 15* 07/13/2014 0543   GFRAA 21* 07/16/2014 0505   GFRAA 22* 07/15/2014 0550   GFRAA 17* 07/13/2014 0543   CMP     Component Value Date/Time   NA 138 07/16/2014 0505   K 5.0 07/16/2014 0505   CL 108 07/16/2014 0505   CO2 20 07/16/2014 0505   GLUCOSE 98 07/16/2014 0505   BUN 72* 07/16/2014 0505   CREATININE 2.86* 07/16/2014 0505   CREATININE 3.44* 11/19/2013 0948   CALCIUM 8.2* 07/16/2014 0505   PROT 6.5 07/03/2014 1200   ALBUMIN 3.7 07/03/2014 1200   AST 19 07/03/2014 1200   ALT 14 07/03/2014 1200   ALKPHOS 58 07/03/2014 1200   BILITOT 1.0 07/03/2014 1200   GFRNONAA 19* 07/16/2014 0505   GFRAA 21* 07/16/2014 0505       Component Value Date/Time   WBC 3.4* 07/16/2014 0505   WBC 3.6* 07/15/2014 0550   WBC 4.8 07/13/2014 0543   HGB 10.9* 07/16/2014 0505   HGB 10.8* 07/15/2014 0550   HGB 11.1* 07/13/2014 0543   HCT 34.9* 07/16/2014 0505   HCT 33.8* 07/15/2014 0550   HCT 35.4* 07/13/2014 0543   MCV 99.1 07/16/2014 0505   MCV 99.1 07/15/2014 0550   MCV 98.9 07/13/2014 0543    Lipid Panel     Component Value Date/Time   CHOL 118 11/19/2013 0948   TRIG 89 11/19/2013 0948   HDL 39* 11/19/2013 0948   CHOLHDL 3.0 11/19/2013 0948   VLDL 18 11/19/2013 0948   LDLCALC 61 11/19/2013 0948     ABG    Component Value Date/Time   TCO2 21 10/24/2012 0718     Lab Results  Component Value Date   TSH 4.674* 09/26/2012   BNP (last 3 results)  Recent Labs  07/03/14 1200  BNP 740.0*    ProBNP (last 3 results)  Recent Labs  04/09/14 1038  PROBNP 9656.0*  Cardiac Panel (last 3 results) No results for input(s): CKTOTAL, CKMB, TROPONINI, RELINDX in the last 72 hours.  Iron/TIBC/Ferritin/ %Sat No results found for: IRON, TIBC, FERRITIN, IRONPCTSAT   EKG Orders placed or performed in visit on 07/19/14  . EKG 12-Lead     Prior Assessment and Plan Problem List as of 08/02/2014      Cardiovascular and Mediastinum   Atrial fibrillation, chronic   Last Assessment & Plan 02/03/2012 Office Visit Written 02/03/2012  5:01 PM by Kathlen Brunswick, MD    Heart rate is well controlled.  Patient does not report symptoms attributable to his atrial arrhythmia.  Current strategy of anticoagulation and heart rate control will be pursued.      AAA (abdominal aortic aneurysm)   Last Assessment & Plan 12/12/2013 Office Visit Written 12/12/2013 10:54 AM by Chuck Hint, MD    The patient has a stable 5.2 cm infrarenal abdominal aortic aneurysm. He is almost 81 years old. I've explained that in a normal risk patient we would consider elective repair at 5.5 cm. Given his age, and somewhat debilitated state, I would probably wait even longer in his case. Regardless, the aneurysm has remained stable in size. I have ordered a follow up ultrasound in 6 months and I will see him back at that time. Fortunately he is not a smoker. His blood pressure has been under good control.      White coat hypertension   Last Assessment & Plan 02/03/2012 Office Visit Written 02/03/2012  5:04 PM by Kathlen Brunswick, MD    Blood pressure is well controlled at this visit.  Current medications will be continued.      Thoracic aortic aneurysm   Aneurysm of peripheral artery   Hypotension    Congestive heart failure   CHF (congestive heart failure)   Systolic CHF, chronic   HTN (hypertension)   Essential hypertension   Chronic systolic heart failure     Digestive   Melena   Esophageal candidiasis   Dysphagia, pharyngoesophageal phase     Genitourinary   Chronic kidney disease, stage III (moderate)   Last Assessment & Plan 02/03/2012 Office Visit Edited 02/07/2012 11:32 AM by Kathlen Brunswick, MD    Renal disease is slowly progressive, with an increase from 2.0 to 2.4 over the past 4 years.  At this rate, there is hope that patient will not require renal replacement therapy.  It is not clear that he would be interested in undergoing dialysis if required.      Nephrolithiasis   Acute renal failure   Urinary tract infection, site not specified   Acute on chronic renal failure   Kidney stones   Hydronephrosis   Renal failure   AKI (acute kidney injury)     Other   VITAMIN B12 DEFICIENCY   HYPERLIPIDEMIA   Last Assessment & Plan 02/03/2012 Office Visit Written 02/03/2012  5:02 PM by Kathlen Brunswick, MD    Excellent control of hyperlipidemia with current therapy, which will be continued.      Gout   Chronic anticoagulation   Anemia   Sepsis   Nonspecific abnormal finding in stool contents   Encounter for therapeutic drug monitoring   Insomnia   Generalized weakness   Thrombocytopenia   Hyperkalemia   Warfarin anticoagulation       Imaging: Ct Abdomen Pelvis Wo Contrast  07/03/2014   CLINICAL DATA:  Multiple change out sub nephrostomy tube. Renal failure. Renal calculi. Infrarenal aortic aneurysm.  EXAM: CT ABDOMEN AND PELVIS WITHOUT CONTRAST  TECHNIQUE: Multidetector CT imaging of the abdomen and pelvis was performed following the standard protocol without IV contrast.  COMPARISON:  06/13/2013  FINDINGS: Lower chest: Marked cardiomegaly with extensive coronary artery atherosclerosis. Aortic valve calcification.  Large right pleural effusion with passive  atelectasis. Mild airway thickening in the left lower lobe.  Hepatobiliary: Stable hypodense lesion in the dome of segment 2 of the liver, proximally 9 mm in diameter on image 16 series 2. Cholecystectomy.  Pancreas: Unremarkable  Spleen: Unremarkable  Adrenals/Urinary Tract: Thin, atrophic right renal parenchyma. Right nephrostomy tube in place coiled in the lower pole collecting system, with a small amount of gas in the collecting system but without overt hydronephrosis. Fluid stranding in the perirenal spaces and renal hilar spaces appears commensurate with the generalized mesenteric, omental, and subcutaneous edema indicating third spacing of fluid. A 3 mm right UPJ calculus is present, image 40 series 2. Vascular calcifications are present along the right renal hilum.  Scattered exophytic lesions of both kidneys of varying complexity are again observed common non having demonstrated worrisome increase in size since the exam from 1 year ago.  On the left, there is hydronephrosis and a flaccid and distended renal collecting system along with several proximal ureteral calculi including a 3 mm calculus on image 49 of series 2 and a 4 mm calculus on image 52 of series 2. The ureter is difficult to follow due to the surrounding fluid. Urinary bladder unremarkable.  Stomach/Bowel: Descending and sigmoid colon diverticulosis.  Vascular/Lymphatic: Extensive atherosclerotic calcification.  Fusiform infrarenal abdominal aortic aneurysm, 5.6 cm anterior - posterior by 5.4 cm transverse (formerly 5.2 by 5.4 cm). The aneurysm extends into the common iliac vessels which are both dilated.  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  Reproductive: Unremarkable  Other: Diffuse subcutaneous, mesenteric and, and omental edema compatible with third spacing of fluid. Large right pleural  effusion and scattered ascites along with presacral edema and bilateral hydroceles extending into the fat containing inguinal hernias. The right inguinal hernia contains a loop of small bowel without findings of strangulation or obstruction. There is subcutaneous edema along the pubis.  Musculoskeletal: Bridging spurring of both sacroiliac joints.  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 There is grade 1 retrolisthesis at L3-4 and grade 1 anterolisthesis at L4-5.  Healing left ninth and tenth distal rib fractures noted.  IMPRESSION: 1. No right hydronephrosis. Abnormal atrophic thinning of both kidneys with scattered complex cysts of both kidneys. The right nephrostomy tube coils in the right kidney lower pole collecting system. 2. Left hydronephrosis with dilated/flaccid collecting system and several proximal ureteral calculi. There is also a small right kidney UPJ stone. 3. Extensive third spacing of fluid including ascites, large right pleural effusion, and subcutaneous, mesenteric, and omental edema. 4. Mild enlargement of the infrarenal abdominal aortic aneurysm. 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 5. Descending and sigmoid colon diverticulosis without active diverticulitis. 6. Severe lumbar spondylosis and degenerative disc disease with multilevel impingement. 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 7. The right inguinal hernia contains a loop of small bowel without strangulation or obstruction. 8. Healing left  ninth and tenth rib fractures.   Electronically Signed   By: Gaylyn Rong M.D.   On: 07/03/2014 18:34   Dg Chest 1 View  07/12/2014   CLINICAL DATA:  Congestive Heart Failure; congested cough; no other complaints from pt  EXAM: CHEST  1 VIEW  COMPARISON:   07/12/2014 at 11:36 a.m.  FINDINGS: Moderate 2 large right-sided pleural effusion is without significant change from the prior exam allowing for the semi-erect positioning on the current study. Associated right lung base opacity, likely atelectasis, is also stable. No convincing pulmonary edema. No left pleural effusion.  Stable cardiomegaly.  No pneumothorax.  IMPRESSION: No convincing change from the radiograph obtained earlier the same date. Moderate to large right pleural effusion with associated right lung base opacity, most likely atelectasis. No convincing pulmonary edema.   Electronically Signed   By: Amie Portland M.D.   On: 07/12/2014 21:50   Dg Chest 2 View  07/12/2014   CLINICAL DATA:  Irregular heartbeat  EXAM: CHEST  2 VIEW  COMPARISON:  07/03/2014  FINDINGS: Persistent changes in the right lung base are noted consistent with effusion and underlying atelectasis/ infiltrate. Postsurgical changes are again seen. The cardiac shadow is enlarged. The left lung remains clear.  IMPRESSION: Persistent right-sided effusion and basilar atelectasis/infiltrate. The overall appearance is stable from the prior exam.   Electronically Signed   By: Alcide Clever M.D.   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 He has undergone unsuccessful attempted crossing of the UPJ obstruction of both from an antegrade and retrograde approach. He currently has a chronic indwelling percutaneous nephrostomy tube and presents for routine tube exchange.  EXAM: IR EXCHANGE NEPHROSTOMY RIGHT  Date: 07/23/2014  PROCEDURE: 1. Nephrostomy tube exchange Interventional Radiologist:  Sterling Big, MD  ANESTHESIA/SEDATION: None required  MEDICATIONS: None additional  FLUOROSCOPY TIME:  42 seconds  18 mGy  CONTRAST:  10 cc  TECHNIQUE: Informed consent was obtained from the patient following  explanation of the procedure, risks, benefits and alternatives. The patient understands, agrees and consents for the procedure. All questions were addressed. A time out was performed.  Maximal barrier sterile technique utilized including caps, mask, sterile gowns, sterile gloves, large sterile drape, hand hygiene, and Betadine skin prep.  A hand injection of contrast material demonstrates that the percutaneous nephrostomy tube has pulled back into the lower pole infundibulum. There is persistent obstruction at the UPJ. The renal collecting system is patulous but not hydronephrotic.  The catheter was then cut and removed over a Bentson wire. A new Cook 10.2 [place] all-purpose drainage catheter was advanced and formed with the locking loop in the renal pelvis. The catheter was secured to the skin with 0 Prolene suture. The patient tolerated the procedure well. The catheter was connected to bag drainage.  COMPLICATIONS: None  IMPRESSION: Successful routine exchange of a right 10.2 French percutaneous nephrostomy tube.  Signed,  Sterling Big, MD  [facility]  [facility]   Electronically Signed   By: Malachy Moan M.D.   On: 07/23/2014 17:18

## 2014-08-02 NOTE — Telephone Encounter (Signed)
Discussed with [name]. Pt has not yet seen GI. He missed appt. Next available with them is 4.26. Referral was for dysphagia.  She will call back later today after talking with [name]. to schedule appt next week with Dr. Brett Canales

## 2014-08-02 NOTE — Telephone Encounter (Signed)
Nurses, plz take charge of this, if pt already sent to gastro was it for this? Or something else? Is pt reporting dysphagia? Is GI aware of this? If they can and will run with this, rec pt to them. If not, ov with me next wk.

## 2014-08-02 NOTE — Patient Instructions (Signed)
Your physician recommends that you schedule a follow-up appointment in: 1 month with Dr. Wyline Mood  Your physician has recommended you make the following change in your medication:   STOP Taking Amlodipine  Start wearing your TED hose  Please have Chest X-Ray Done Today.  Thank you for choosing [facility]!

## 2014-08-05 ENCOUNTER — Telehealth: Payer: Self-pay | Admitting: Adult Health

## 2014-08-05 MED ORDER — FUROSEMIDE 40 MG PO TABS: 60.0000 mg | ORAL_TABLET | Freq: Every day | ORAL | Status: AC

## 2014-08-05 MED ORDER — METOLAZONE 5 MG PO TABS: ORAL_TABLET | ORAL | Status: AC

## 2014-08-05 NOTE — Telephone Encounter (Signed)
Pt's sister called on-call line Sunday and spoke with "[name]" regarding left leg weeping fluid and his testicles are swollen to the point he cannot make out genitalia.He was told to take 2 fluid pills (lasix) last night and 2 today and call us.He states he doesn't feel any different other than the weeping has stopped on his leg.   Will message K.lawrence NP

## 2014-08-05 NOTE — Telephone Encounter (Signed)
Patient would like to speak with nurse in regards to medication change per on call doc last night / tg

## 2014-08-05 NOTE — Telephone Encounter (Signed)
I spoke with patient he understands to double his lasix dose( take 3 lasix pills (120 mg) for the next 2 days ) and also take metolazone 5 mg daily for the next 2 days. I will call him in 2 days for update.  I also spoke with sister Buzzy Han and she verbalized understanding.

## 2014-08-05 NOTE — Telephone Encounter (Signed)
He was seen in the office on Friday and has abnormal CXR with worsening pleural effusion. I talked to Dr. Marcina Millard about it, thinking the patient should be in the hospital. He was not taking his medications as directed. Going up on the lasix is good but not sure this will be enough.   Give him an Rx for metolazone 5 mg PO # 5. Have him take one 5 mg tablet a day for 2 days with the double lasix dose. If he is no better he most likely will need to go to ER and be admitted for IV diureses.

## 2014-08-07 ENCOUNTER — Ambulatory Visit (INDEPENDENT_AMBULATORY_CARE_PROVIDER_SITE_OTHER): Payer: Medicare Other | Admitting: Family Medicine

## 2014-08-07 ENCOUNTER — Telehealth: Payer: Self-pay | Admitting: Cardiology

## 2014-08-07 ENCOUNTER — Encounter: Payer: Self-pay | Admitting: Family Medicine

## 2014-08-07 VITALS — BP 120/70 | Ht 70.0 in | Wt 192.5 lb

## 2014-08-07 DIAGNOSIS — R1314 Dysphagia, pharyngoesophageal phase: Secondary | ICD-10-CM | POA: Diagnosis not present

## 2014-08-07 DIAGNOSIS — N189 Chronic kidney disease, unspecified: Secondary | ICD-10-CM

## 2014-08-07 DIAGNOSIS — N179 Acute kidney failure, unspecified: Secondary | ICD-10-CM | POA: Diagnosis not present

## 2014-08-07 DIAGNOSIS — F32A Depression, unspecified: Secondary | ICD-10-CM

## 2014-08-07 DIAGNOSIS — F329 Major depressive disorder, single episode, unspecified: Secondary | ICD-10-CM | POA: Diagnosis not present

## 2014-08-07 DIAGNOSIS — I10 Essential (primary) hypertension: Secondary | ICD-10-CM | POA: Diagnosis not present

## 2014-08-07 DIAGNOSIS — E860 Dehydration: Secondary | ICD-10-CM

## 2014-08-07 DIAGNOSIS — I5022 Chronic systolic (congestive) heart failure: Secondary | ICD-10-CM | POA: Diagnosis not present

## 2014-08-07 DIAGNOSIS — R531 Weakness: Secondary | ICD-10-CM

## 2014-08-07 NOTE — Progress Notes (Signed)
   Subjective:    Patient ID: [name], male    DOB: 1925-05-04, 81 y.o.   MRN: [id number]  HPI Patient is here today to follow up on his dysphagia. Patient for some reason mistakenly thought he would see a GI doctor here at his primary care doctor's office. He is accompanied by a sister who also seemed to be laboring under the same misconception. Next  Patient fell this morning and has some abrasions on his right arm. Patient has no other concerns at this time. Next  Patient has serious renal insufficiency. He has been told he may well be facing dialysis. He states today once again he definitely does not want dialysis. Nor will he except. Next  Recently has had fluid abnormalities. Associated with his cardiomyopathy. Currently cardiologist readjusting this. Next  Patient reports ongoing feelings of "what's the use". Not suicidal. States though he is definitely prepared and ready if he were to pass away. Resistant to the idea of additional medication in this regard.  Patient still living alone with family members checking in on him       Review of Systems No current headache chest pain some chronic back pain. Ongoing chronic diminished energy. No acute shortness of breath.    Objective:   Physical Exam  Patient is alert today, oriented. Somewhat confused so in terms of what we can offer him here. Vitals stable. HEENT normal. Abrasion right arm. Lungs clear. Heart regular rhythm but controlled. Ankles trace edema.      Assessment & Plan:  Impression general frail status discussed at length. I advised patient along with his sister that it is time to consider some form of rest home or something similar. #2 chronic renal insufficiency with patient adamant about no dialysis. This despite our telling him the nature of dialysis day #3 depression discussed. Partly protracted grief patient has had a lot of losses 5. Not interested in any medications or counseling in this regard. #4  cardiomyopathy with element of congestive heart failure specialist following this. #5 soft tissue injury. Fall plan diet discussed exercise discussed. Follow-up with specialist. Follow-up here. Family advised closely work with patient. Consider contacting social services about other potential resources. Follow-up as scheduled W[facility]

## 2014-08-07 NOTE — Telephone Encounter (Signed)
I spoke with family, [name], and she states patient told her by his scales at home he went from 189 lbs to 178 lbs.He accidentally took an extra [facility] on Saturday. She noticed his swelling had gone down in his hands as he could now get his watch on. She asked what should he do dose wise regarding lasix.I told her to go back to usual 60 mg daily dose and we would call tomorrow when Ms.Lawrence NP reviews chart as she is out of office today.  I spoke with patient and he states his scrotal swelling is a bout 1/3 the size of what it was and he feels pretty good.

## 2014-08-07 NOTE — Telephone Encounter (Signed)
Patient was instructed by Dr Donnie Aho to change his fluid pill dose and had another medication called in. They have questions on what to do next with medication.

## 2014-08-08 ENCOUNTER — Telehealth: Payer: Self-pay | Admitting: Family Medicine

## 2014-08-08 ENCOUNTER — Other Ambulatory Visit: Payer: Self-pay | Admitting: *Deleted

## 2014-08-08 MED ORDER — HYDROCODONE-ACETAMINOPHEN 5-325 MG PO TABS: ORAL_TABLET | ORAL | Status: AC

## 2014-08-08 NOTE — Telephone Encounter (Signed)
Seen yesterday for fall. Pt states his right leg is hurting and he is not able to rest good. Would like something for pain so he can rest at night. He needs something to be called in and delivered. He does not want tramadol because it does not help.

## 2014-08-08 NOTE — Telephone Encounter (Signed)
Hydrocodone 5 mg/325, #30, 1/2to -1 every 4 hours when necessary severe pain caution drowsiness

## 2014-08-08 NOTE — Telephone Encounter (Signed)
Script ready for pickup pt notified. 

## 2014-08-08 NOTE — Telephone Encounter (Signed)
Pt would like rx for hydrocodne. He will try to get someone to come pick it up today for him.

## 2014-08-08 NOTE — Telephone Encounter (Signed)
Pt is requesting something for pain to be called into [facility] [employer] And have it delivered. Pt is in pain from his fall yesterday. Pt states he does Not want tramadol.

## 2014-08-08 NOTE — Telephone Encounter (Signed)
This patient is on Coumadin this increases his risk of bleeding. Because of this the only medicines that are safe to take with it would be plain Tylenol, tramadol, or hydrocodone. The only medication that can be called in his tramadol. Hydrocodone is a medication that is stronger than tramadol that could be used but the prescription must be written.

## 2014-08-09 NOTE — Telephone Encounter (Signed)
Patient notified. All questions answered. Patient voiced understanding. Copy to PCP.

## 2014-08-09 NOTE — Telephone Encounter (Signed)
He should stay on lasix 40 mg daily. Will need a BMET if not already ordered. He is to take additional lasix, 20 mg, for wt gain of 3-5 lbs. Low salt diet.

## 2014-08-11 DIAGNOSIS — F329 Major depressive disorder, single episode, unspecified: Secondary | ICD-10-CM | POA: Insufficient documentation

## 2014-08-11 DIAGNOSIS — F32A Depression, unspecified: Secondary | ICD-10-CM | POA: Insufficient documentation

## 2014-08-12 ENCOUNTER — Ambulatory Visit: Payer: Medicare Other | Admitting: Family Medicine

## 2014-08-13 ENCOUNTER — Telehealth: Payer: Self-pay | Admitting: Adult Health

## 2014-08-13 ENCOUNTER — Telehealth: Payer: Self-pay | Admitting: Family Medicine

## 2014-08-13 NOTE — Telephone Encounter (Signed)
Brother states that he will take Mr. Trayer to lab and have blood work done today

## 2014-08-13 NOTE — Telephone Encounter (Signed)
Can take quite a few weeks, rec tyl alone if possible, nmay take the other med if absolutely necessary

## 2014-08-13 NOTE — Telephone Encounter (Signed)
Discussed with patient. Patient verbalized understanding and stated his fluid is doing much better

## 2014-08-13 NOTE — Telephone Encounter (Signed)
Patient states that he would like to discuss his progress with nurse. / tg

## 2014-08-13 NOTE — Telephone Encounter (Signed)
Pt would like to know how long it will take his sprain/strain from his  Fall in his groin area down to his knee to heal? He also wants to know If the pain med that he was issued by Dr Lorin Picket is safe to take? His  Family does not want him to take the med but he feels he needs it.   Please advise the patient, thanks   ( he was wanting to talk to you directly)

## 2014-08-15 LAB — BASIC METABOLIC PANEL
BUN: 124 mg/dL — ABNORMAL HIGH (ref 6–23)
CALCIUM: 7.9 mg/dL — AB (ref 8.4–10.5)
CO2: 19 meq/L (ref 19–32)
CREATININE: 4.41 mg/dL — AB (ref 0.50–1.35)
Chloride: 101 mEq/L (ref 96–112)
Glucose, Bld: 76 mg/dL (ref 70–99)
Potassium: 4.7 mEq/L (ref 3.5–5.3)
Sodium: 136 mEq/L (ref 135–145)

## 2014-08-18 ENCOUNTER — Encounter (HOSPITAL_COMMUNITY): Payer: Self-pay | Admitting: *Deleted

## 2014-08-18 ENCOUNTER — Inpatient Hospital Stay (HOSPITAL_COMMUNITY)
Admission: EM | Admit: 2014-08-18 | Discharge: 2014-09-25 | DRG: 871 | Disposition: E | Payer: Medicare Other | Attending: Internal Medicine | Admitting: Internal Medicine

## 2014-08-18 ENCOUNTER — Emergency Department (HOSPITAL_COMMUNITY): Payer: Medicare Other

## 2014-08-18 DIAGNOSIS — L03115 Cellulitis of right lower limb: Secondary | ICD-10-CM | POA: Diagnosis present

## 2014-08-18 DIAGNOSIS — H409 Unspecified glaucoma: Secondary | ICD-10-CM | POA: Diagnosis present

## 2014-08-18 DIAGNOSIS — R6521 Severe sepsis with septic shock: Secondary | ICD-10-CM | POA: Diagnosis present

## 2014-08-18 DIAGNOSIS — Z515 Encounter for palliative care: Secondary | ICD-10-CM | POA: Diagnosis not present

## 2014-08-18 DIAGNOSIS — W19XXXA Unspecified fall, initial encounter: Secondary | ICD-10-CM | POA: Diagnosis present

## 2014-08-18 DIAGNOSIS — Z833 Family history of diabetes mellitus: Secondary | ICD-10-CM

## 2014-08-18 DIAGNOSIS — I714 Abdominal aortic aneurysm, without rupture: Secondary | ICD-10-CM | POA: Diagnosis present

## 2014-08-18 DIAGNOSIS — I739 Peripheral vascular disease, unspecified: Secondary | ICD-10-CM | POA: Diagnosis present

## 2014-08-18 DIAGNOSIS — E440 Moderate protein-calorie malnutrition: Secondary | ICD-10-CM | POA: Diagnosis present

## 2014-08-18 DIAGNOSIS — N390 Urinary tract infection, site not specified: Secondary | ICD-10-CM | POA: Diagnosis present

## 2014-08-18 DIAGNOSIS — L03116 Cellulitis of left lower limb: Secondary | ICD-10-CM | POA: Diagnosis present

## 2014-08-18 DIAGNOSIS — K222 Esophageal obstruction: Secondary | ICD-10-CM | POA: Diagnosis present

## 2014-08-18 DIAGNOSIS — R570 Cardiogenic shock: Secondary | ICD-10-CM | POA: Insufficient documentation

## 2014-08-18 DIAGNOSIS — Z7901 Long term (current) use of anticoagulants: Secondary | ICD-10-CM | POA: Diagnosis not present

## 2014-08-18 DIAGNOSIS — I132 Hypertensive heart and chronic kidney disease with heart failure and with stage 5 chronic kidney disease, or end stage renal disease: Secondary | ICD-10-CM | POA: Diagnosis present

## 2014-08-18 DIAGNOSIS — A4152 Sepsis due to Pseudomonas: Secondary | ICD-10-CM | POA: Diagnosis not present

## 2014-08-18 DIAGNOSIS — J90 Pleural effusion, not elsewhere classified: Secondary | ICD-10-CM

## 2014-08-18 DIAGNOSIS — Y92009 Unspecified place in unspecified non-institutional (private) residence as the place of occurrence of the external cause: Secondary | ICD-10-CM

## 2014-08-18 DIAGNOSIS — I5023 Acute on chronic systolic (congestive) heart failure: Secondary | ICD-10-CM | POA: Diagnosis present

## 2014-08-18 DIAGNOSIS — A419 Sepsis, unspecified organism: Secondary | ICD-10-CM | POA: Diagnosis present

## 2014-08-18 DIAGNOSIS — Y833 Surgical operation with formation of external stoma as the cause of abnormal reaction of the patient, or of later complication, without mention of misadventure at the time of the procedure: Secondary | ICD-10-CM | POA: Diagnosis present

## 2014-08-18 DIAGNOSIS — Z436 Encounter for attention to other artificial openings of urinary tract: Secondary | ICD-10-CM | POA: Insufficient documentation

## 2014-08-18 DIAGNOSIS — I481 Persistent atrial fibrillation: Secondary | ICD-10-CM | POA: Diagnosis present

## 2014-08-18 DIAGNOSIS — I509 Heart failure, unspecified: Secondary | ICD-10-CM | POA: Diagnosis not present

## 2014-08-18 DIAGNOSIS — Z79899 Other long term (current) drug therapy: Secondary | ICD-10-CM

## 2014-08-18 DIAGNOSIS — N179 Acute kidney failure, unspecified: Secondary | ICD-10-CM | POA: Diagnosis present

## 2014-08-18 DIAGNOSIS — N185 Chronic kidney disease, stage 5: Secondary | ICD-10-CM | POA: Diagnosis present

## 2014-08-18 DIAGNOSIS — G9340 Encephalopathy, unspecified: Secondary | ICD-10-CM | POA: Diagnosis present

## 2014-08-18 DIAGNOSIS — B965 Pseudomonas (aeruginosa) (mallei) (pseudomallei) as the cause of diseases classified elsewhere: Secondary | ICD-10-CM | POA: Diagnosis present

## 2014-08-18 DIAGNOSIS — Z66 Do not resuscitate: Secondary | ICD-10-CM | POA: Diagnosis present

## 2014-08-18 DIAGNOSIS — N99528 Other complication of other external stoma of urinary tract: Secondary | ICD-10-CM

## 2014-08-18 DIAGNOSIS — J9600 Acute respiratory failure, unspecified whether with hypoxia or hypercapnia: Secondary | ICD-10-CM | POA: Diagnosis present

## 2014-08-18 DIAGNOSIS — D6959 Other secondary thrombocytopenia: Secondary | ICD-10-CM | POA: Diagnosis present

## 2014-08-18 DIAGNOSIS — E872 Acidosis: Secondary | ICD-10-CM | POA: Diagnosis present

## 2014-08-18 DIAGNOSIS — N183 Chronic kidney disease, stage 3 unspecified: Secondary | ICD-10-CM | POA: Diagnosis present

## 2014-08-18 DIAGNOSIS — D638 Anemia in other chronic diseases classified elsewhere: Secondary | ICD-10-CM | POA: Diagnosis present

## 2014-08-18 DIAGNOSIS — Z6829 Body mass index (BMI) 29.0-29.9, adult: Secondary | ICD-10-CM | POA: Diagnosis not present

## 2014-08-18 DIAGNOSIS — I4891 Unspecified atrial fibrillation: Secondary | ICD-10-CM | POA: Diagnosis present

## 2014-08-18 DIAGNOSIS — Z9181 History of falling: Secondary | ICD-10-CM

## 2014-08-18 DIAGNOSIS — T17908A Unspecified foreign body in respiratory tract, part unspecified causing other injury, initial encounter: Secondary | ICD-10-CM | POA: Insufficient documentation

## 2014-08-18 DIAGNOSIS — I482 Chronic atrial fibrillation: Secondary | ICD-10-CM | POA: Diagnosis present

## 2014-08-18 DIAGNOSIS — I959 Hypotension, unspecified: Secondary | ICD-10-CM | POA: Insufficient documentation

## 2014-08-18 DIAGNOSIS — N131 Hydronephrosis with ureteral stricture, not elsewhere classified: Secondary | ICD-10-CM | POA: Diagnosis present

## 2014-08-18 DIAGNOSIS — E785 Hyperlipidemia, unspecified: Secondary | ICD-10-CM | POA: Diagnosis present

## 2014-08-18 DIAGNOSIS — Z7189 Other specified counseling: Secondary | ICD-10-CM | POA: Diagnosis not present

## 2014-08-18 DIAGNOSIS — R791 Abnormal coagulation profile: Secondary | ICD-10-CM | POA: Diagnosis present

## 2014-08-18 DIAGNOSIS — J9601 Acute respiratory failure with hypoxia: Secondary | ICD-10-CM | POA: Diagnosis present

## 2014-08-18 DIAGNOSIS — R52 Pain, unspecified: Secondary | ICD-10-CM | POA: Insufficient documentation

## 2014-08-18 DIAGNOSIS — R0602 Shortness of breath: Secondary | ICD-10-CM

## 2014-08-18 DIAGNOSIS — I872 Venous insufficiency (chronic) (peripheral): Secondary | ICD-10-CM | POA: Diagnosis present

## 2014-08-18 DIAGNOSIS — I10 Essential (primary) hypertension: Secondary | ICD-10-CM | POA: Diagnosis present

## 2014-08-18 DIAGNOSIS — K5900 Constipation, unspecified: Secondary | ICD-10-CM | POA: Diagnosis present

## 2014-08-18 DIAGNOSIS — M7989 Other specified soft tissue disorders: Secondary | ICD-10-CM | POA: Diagnosis present

## 2014-08-18 DIAGNOSIS — Z8249 Family history of ischemic heart disease and other diseases of the circulatory system: Secondary | ICD-10-CM | POA: Diagnosis not present

## 2014-08-18 DIAGNOSIS — T45515A Adverse effect of anticoagulants, initial encounter: Secondary | ICD-10-CM | POA: Diagnosis present

## 2014-08-18 DIAGNOSIS — K224 Dyskinesia of esophagus: Secondary | ICD-10-CM | POA: Diagnosis present

## 2014-08-18 DIAGNOSIS — Z951 Presence of aortocoronary bypass graft: Secondary | ICD-10-CM | POA: Diagnosis not present

## 2014-08-18 DIAGNOSIS — I519 Heart disease, unspecified: Secondary | ICD-10-CM | POA: Diagnosis not present

## 2014-08-18 DIAGNOSIS — I5022 Chronic systolic (congestive) heart failure: Secondary | ICD-10-CM | POA: Diagnosis not present

## 2014-08-18 DIAGNOSIS — Z87442 Personal history of urinary calculi: Secondary | ICD-10-CM

## 2014-08-18 DIAGNOSIS — I5043 Acute on chronic combined systolic (congestive) and diastolic (congestive) heart failure: Secondary | ICD-10-CM | POA: Diagnosis not present

## 2014-08-18 DIAGNOSIS — I9589 Other hypotension: Secondary | ICD-10-CM | POA: Diagnosis not present

## 2014-08-18 DIAGNOSIS — N189 Chronic kidney disease, unspecified: Secondary | ICD-10-CM | POA: Diagnosis present

## 2014-08-18 LAB — CBC WITH DIFFERENTIAL/PLATELET
BASOS PCT: 0 % (ref 0–1)
Band Neutrophils: 0 % (ref 0–10)
Basophils Absolute: 0 10*3/uL (ref 0.0–0.1)
Blasts: 0 %
Eosinophils Absolute: 0 10*3/uL (ref 0.0–0.7)
Eosinophils Relative: 0 % (ref 0–5)
HCT: 34.6 % — ABNORMAL LOW (ref 39.0–52.0)
HEMOGLOBIN: 11.3 g/dL — AB (ref 13.0–17.0)
LYMPHS ABS: 0.5 10*3/uL — AB (ref 0.7–4.0)
LYMPHS PCT: 4 % — AB (ref 12–46)
MCH: 31.4 pg (ref 26.0–34.0)
MCHC: 32.7 g/dL (ref 30.0–36.0)
MCV: 96.1 fL (ref 78.0–100.0)
MONOS PCT: 6 % (ref 3–12)
Metamyelocytes Relative: 0 %
Monocytes Absolute: 0.7 10*3/uL (ref 0.1–1.0)
Myelocytes: 0 %
NEUTROS ABS: 10.3 10*3/uL — AB (ref 1.7–7.7)
NEUTROS PCT: 90 % — AB (ref 43–77)
NRBC: 0 /100{WBCs}
PLATELETS: 107 10*3/uL — AB (ref 150–400)
Promyelocytes Absolute: 0 %
RBC: 3.6 MIL/uL — ABNORMAL LOW (ref 4.22–5.81)
RDW: 19.8 % — ABNORMAL HIGH (ref 11.5–15.5)
WBC: 11.5 10*3/uL — ABNORMAL HIGH (ref 4.0–10.5)

## 2014-08-18 LAB — BASIC METABOLIC PANEL
ANION GAP: 14 (ref 5–15)
BUN: 141 mg/dL — ABNORMAL HIGH (ref 6–23)
CALCIUM: 8 mg/dL — AB (ref 8.4–10.5)
CO2: 16 mmol/L — AB (ref 19–32)
CREATININE: 4.44 mg/dL — AB (ref 0.50–1.35)
Chloride: 104 mmol/L (ref 96–112)
GFR calc Af Amer: 13 mL/min — ABNORMAL LOW (ref >90)
GFR calc non Af Amer: 11 mL/min — ABNORMAL LOW (ref >90)
GLUCOSE: 102 mg/dL — AB (ref 70–99)
Potassium: 4.5 mmol/L (ref 3.5–5.1)
SODIUM: 134 mmol/L — AB (ref 135–145)

## 2014-08-18 LAB — URINALYSIS, ROUTINE W REFLEX MICROSCOPIC
BILIRUBIN URINE: NEGATIVE
Glucose, UA: NEGATIVE mg/dL
Ketones, ur: NEGATIVE mg/dL
Nitrite: NEGATIVE
Protein, ur: >300 mg/dL — AB
SPECIFIC GRAVITY, URINE: 1.02 (ref 1.005–1.030)
Urobilinogen, UA: 0.2 mg/dL (ref 0.0–1.0)
pH: 7 (ref 5.0–8.0)

## 2014-08-18 LAB — URINE MICROSCOPIC-ADD ON

## 2014-08-18 LAB — BRAIN NATRIURETIC PEPTIDE: B Natriuretic Peptide: 694 pg/mL — ABNORMAL HIGH (ref 0.0–100.0)

## 2014-08-18 LAB — I-STAT CG4 LACTIC ACID, ED: Lactic Acid, Venous: 2.27 mmol/L (ref 0.5–2.0)

## 2014-08-18 LAB — PROTIME-INR: INR: >10 (ref 0.00–1.49)

## 2014-08-18 MED ORDER — SODIUM CHLORIDE 0.9 % IV BOLUS (SEPSIS)
1000.0000 mL | Freq: Once | INTRAVENOUS | Status: AC
  Administered 2014-08-18: 1000 mL via INTRAVENOUS

## 2014-08-18 MED ORDER — PIPERACILLIN-TAZOBACTAM 3.375 G IVPB 30 MIN
3.3750 g | Freq: Once | INTRAVENOUS | Status: AC
  Administered 2014-08-18: 3.375 g via INTRAVENOUS
  Filled 2014-08-18: qty 50

## 2014-08-18 MED ORDER — SODIUM CHLORIDE 0.9 % IV BOLUS (SEPSIS)
1000.0000 mL | Freq: Once | INTRAVENOUS | Status: AC
  Administered 2014-08-19: 1000 mL via INTRAVENOUS

## 2014-08-18 MED ORDER — SODIUM CHLORIDE 0.9 % IV BOLUS (SEPSIS): 600.0000 mL | Freq: Once | INTRAVENOUS | Status: DC

## 2014-08-18 MED ORDER — VITAMIN K1 10 MG/ML IJ SOLN
10.0000 mg | Freq: Once | INTRAVENOUS | Status: AC
  Administered 2014-08-19: 10 mg via INTRAVENOUS
  Filled 2014-08-18: qty 1

## 2014-08-18 MED ORDER — VANCOMYCIN HCL 500 MG IV SOLR
500.0000 mg | Freq: Once | INTRAVENOUS | Status: AC
  Administered 2014-08-19: 500 mg via INTRAVENOUS
  Filled 2014-08-18: qty 500

## 2014-08-18 MED ORDER — NOREPINEPHRINE BITARTRATE 1 MG/ML IV SOLN
0.0000 ug/min | INTRAVENOUS | Status: DC
  Administered 2014-08-19: 10 ug/min via INTRAVENOUS
  Administered 2014-08-19: 2 ug/min via INTRAVENOUS
  Filled 2014-08-18 (×3): qty 4

## 2014-08-18 MED ORDER — VANCOMYCIN HCL IN DEXTROSE 1-5 GM/200ML-% IV SOLN
1000.0000 mg | Freq: Once | INTRAVENOUS | Status: AC
  Administered 2014-08-18: 1000 mg via INTRAVENOUS
  Filled 2014-08-18: qty 200

## 2014-08-18 NOTE — Evaluation (Signed)
Received call from ER physician Dr. Rosalia Hammers about preliminary evaluation for patient with noted florid septic shock in the setting of right lower extremity cellulitis/potential ischemic limb among other presenting issues. Patient with noted multiple comorbidities including chronic systolic heart failure with EF of 35-40% and diffuse hypokinesis, H of fibrillation on Coumadin, stage III chronic kidney disease,? Renal cell carcinoma, peripheral artery disease, chronic venous insufficiency. Presented with temp of 96.8, blood pressure in the 70s to 80s despite IV fluids. White blood cell count 11.5, creatinine 4.44, INR greater than 10. Lactate 2.3. Chest x-ray with persisting right pleural effusion with potential atelectasis versus infection. Patient currently refusing admission to [facility]. Discuss case with Dr. Rosalia Hammers. Patient is felt to meet criteria for septic shock. Would likely need intubation with any further IV fluids. Patient needs pressors at present. Dr. Rosalia Hammers discussed the case with on-call critical care while at the bedside. Will be accepted to critical care service at [facility].

## 2014-08-18 NOTE — Progress Notes (Signed)
ANTIBIOTIC CONSULT NOTE - INITIAL  Pharmacy Consult for Vancomycin and Zosyn Indication: cellulitis  Allergies  Allergen Reactions  . Sulfa Antibiotics Rash    Nausea     Patient Measurements:    Vital Signs: Temp: 96.8 F (36 C) (04/24 2152) Temp Source: Rectal (04/24 2152) BP: 100/75 mmHg (04/24 2200) Pulse Rate: 93 (04/24 2200) Intake/Output from previous day:   Intake/Output from this shift:    Labs:  Recent Labs  08/10/2014 2058  WBC 11.5*  HGB 11.3*  PLT 107*  CREATININE 4.44*   Estimated Creatinine Clearance: 12.3 mL/min (by C-G formula based on Cr of 4.44). No results for input(s): VANCOTROUGH, VANCOPEAK, VANCORANDOM, GENTTROUGH, GENTPEAK, GENTRANDOM, TOBRATROUGH, TOBRAPEAK, TOBRARND, AMIKACINPEAK, AMIKACINTROU, AMIKACIN in the last 72 hours.   Microbiology: Recent Results (from the past 720 hour(s))  Blood Culture (routine x 2)     Status: None (Preliminary result)   Collection Time: 08/15/2014  8:58 PM  Result Value Ref Range Status   Specimen Description BLOOD LEFT HAND  Final   Special Requests BOTTLES DRAWN AEROBIC ONLY 7CC  Final   Culture PENDING  Incomplete   Report Status PENDING  Incomplete  Blood Culture (routine x 2)     Status: None (Preliminary result)   Collection Time: 08/20/2014  9:05 PM  Result Value Ref Range Status   Specimen Description BLOOD LEFT HAND  Final   Special Requests BOTTLES DRAWN AEROBIC ONLY 7CC  Final   Culture PENDING  Incomplete   Report Status PENDING  Incomplete    Medical History: Past Medical History  Diagnosis Date  . Hyperlipidemia   . White coat hypertension   . Cardiomyopathy      EF:30-40% in 06/1998, but 50-55% in 2009;  HX ISCHEMIC CARDIOMYOPATHY  . Chronic atrial fibrillation   . Vitamin B12 deficiency   . Chronic anticoagulation   . Arthritis   . Sensory neuropathy   . Chronic kidney disease, stage III (moderate)     creatinine 1.5 2001, 2.0 2009, 2.28 in 12/2009  . Mild renal insufficiency   .  Hydronephrosis, bilateral   . Ureteral calculi     BILATERAL  . History of CHF (congestive heart failure)     1999  &  2005  . History of kidney stones   . Bilateral renal cysts   . 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 Chronic venous insufficiency   . PAD (peripheral artery disease)   . Edema of both legs   . History of gout     per pt stable  . Glaucoma of both eyes   . Wears hearing aid     bilateral  . CHF (congestive heart failure)   . Shortness of breath dyspnea   . Arrhythmia     Afib CHADS VASC Score of 5.   Anti-infectives    Start     Dose/Rate Route Frequency Ordered Stop   07/29/2014 2330  vancomycin (VANCOCIN) 500 mg in sodium chloride 0.9 % 100 mL IVPB    Comments:  * EXTRA DOSE - Give in addition to previous dose to make  total dose **   500 mg 100 mL/hr over 60 Minutes Intravenous  Once 08/04/2014 2235     08/10/2014 2115  piperacillin-tazobactam (ZOSYN) IVPB 3.375 g     3.375 g 100 mL/hr over 30 Minutes Intravenous  Once 08/14/2014 2113 08/14/2014 2230   08/09/2014 2115  vancomycin (VANCOCIN) IVPB 1000 mg/200 mL premix     1,000 mg 200 mL/hr over 60 Minutes Intravenous  Once 08/09/2014 2113       Assessment: 81yo male admitted with cellulitis.  SCr elevated. Asked to initiate Vancomycin and Zosyn. Initial doses given in ED  Goal of Therapy:  Vancomycin trough level 10-15 mcg/ml  Plan:  Vancomycin 1500mg  IV total now x 1  (extra dose of 500mg  ordered) Zosyn 2.25gm IV q8h Check Vancomycin trough level at steady state Monitor labs, renal fxn, progress and cultures  Valrie Hart A 08/22/2014,10:36 PM

## 2014-08-18 NOTE — ED Notes (Signed)
Pt c/o bilateral leg swelling x 3 days.

## 2014-08-18 NOTE — ED Notes (Signed)
CRITICAL VALUE ALERT  Critical value received:  INR >10  Date of notification:  07/31/2014  Time of notification:  2245  Critical value read back:Yes.    Nurse who received alert:  Wolfgang Phoenix  MD notified (1st page):  Ray  Time of first page:  2245  MD notified (2nd page):  Time of second page:  Responding MD:  Rosalia Hammers  Time MD responded:  2245

## 2014-08-18 NOTE — ED Provider Notes (Signed)
CSN: [id number]     Arrival date & time 08/06/2014  1957 History   First MD Initiated Contact with Patient 07/29/2014 2039     Chief Complaint  Patient presents with  . Leg Swelling     (Consider location/radiation/quality/duration/timing/severity/associated sxs/prior Treatment) HPI 81 year old man history of hyperlipidemia, chronic kidney disease, chronic atrial fibrillation on Coumadin therapy, and cardiomyopathy who presents today complaining of wound to right lower extremity. He has had chronic peripheral edema with chronic skin changes. He has begun having a weeping wound of the right lower extremity. He states that the wound is draining with a considerable amount of drainage last night. He complains of pain at the site of the wound. He denies headache, head injury, chest pain, dyspnea, abdominal pain, injury to the leg, or pain in the right foot. Primary care physician is . Dr. Gerda Diss Past Medical History  Diagnosis Date  . Hyperlipidemia   . White coat hypertension   . Cardiomyopathy      EF:30-40% in 06/1998, but 50-55% in 2009;  HX ISCHEMIC CARDIOMYOPATHY  . Chronic atrial fibrillation   . Vitamin B12 deficiency   . Chronic anticoagulation   . Arthritis   . Sensory neuropathy   . Chronic kidney disease, stage III (moderate)     creatinine 1.5 2001, 2.0 2009, 2.28 in 12/2009  . Mild renal insufficiency   . Hydronephrosis, bilateral   . Ureteral calculi     BILATERAL  . History of CHF (congestive heart failure)     1999  &  2005  . History of kidney stones   . Bilateral renal cysts   . 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 Chronic venous insufficiency   . PAD (peripheral artery disease)   . Edema of both legs   . History of gout     per pt stable  . Glaucoma of both eyes   . Wears hearing aid     bilateral  . CHF (congestive heart failure)   . Shortness of  breath dyspnea   . Arrhythmia     Afib CHADS VASC Score of 5.   Past Surgical History  Procedure Laterality Date  . Repair thoracic aorta  01/1995    s/p rupture  . Cataract extraction w/ intraocular lens  implant, bilateral  2012  . 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 Laparoscopic cholecystectomy  JAN 2005  . Percutaneous nephrostolithotomy  1970's  . Abdominal aortagram  07-16-2003  DR ROTHBART    W/ COIL EMBOLIZATION OF SIX BRANCHES OF RIGHT INTERNAL RENAL ARTERY ANEURYSM  . Cardiovascular stress test  04-25-2003    LOW RISK CARDIOLITE STUDY/ MODERATE LV DILATATION AND IMPAIRMENT OF LVSF/  NO ISCHEMIA  . Transthoracic echocardiogram  02-07-2008  DR ROTHBART    MODERATE DILATED LV/  EF 50-55%/ MILD AR/ MILD TO MODERATE AORTIC ROOT DILATATION/ MODERATE MR/ MODERATE LA   &  RA   DIILATED/ MILD TO MODERATE TR  . Cystoscopy w/ ureteral stent placement Bilateral 10/24/2012    Procedure: CYSTOSCOPY WITH RETROGRADE PYELOGRAM/URETERAL STENT PLACEMENT;  Surgeon: Lindaann Slough, MD;  Location: [facility];  Service: Urology;  Laterality: Bilateral;  . Cystoscopy with ureteroscopy Left 11/27/2012    Procedure: CYSTOSCOPY,LEFT URETEROSCOPY WITH LASER LITHOTRIPSY/REMOVAL OF MIGRATED STENT, PLACEMENT OF LEFT STENT;  Surgeon:  Garnett Farm, MD;  Location: [facility];  Service: Urology;  Laterality: Left;  . Holmium laser application Left 11/27/2012    Procedure: HOLMIUM LASER APPLICATION;  Surgeon: Garnett Farm, MD;  Location: [facility];  Service: Urology;  Laterality: Left;  . Cystoscopy with ureteroscopy and stent placement N/A 12/29/2012    Procedure: CYSTOSCOPY WITH URETEROSCOPY AND STENT PLACEMENT, removal of right and left stents, retrogrades, right stent exchange;  Surgeon: Garnett Farm, MD;  Location: [facility];  Service: Urology;  Laterality: N/A;  . Holmium laser application Right 12/29/2012    Procedure: HOLMIUM LASER APPLICATION;   Surgeon: Garnett Farm, MD;  Location: [facility];  Service: Urology;  Laterality: Right;  HLL OF (RT) URETERAL PELVIC JUNCTION STONE  . Cystoscopy with retrograde pyelogram, ureteroscopy and stent placement Right 01/29/2013    Procedure: CYSTOSCOPY WITH RIGHT URETEROSCOPY AND STENT PLACEMENT;  Surgeon: Garnett Farm, MD;  Location: [facility];  Service: Urology;  Laterality: Right;  . Holmium laser application N/A 01/29/2013    Procedure: HOLMIUM LASER APPLICATION;  Surgeon: Garnett Farm, MD;  Location: [facility];  Service: Urology;  Laterality: N/A;  . Esophagogastroduodenoscopy N/A 02/16/2013    Procedure: ESOPHAGOGASTRODUODENOSCOPY (EGD);  Surgeon: Meryl Dare, MD;  Location: [facility];  Service: Endoscopy;  Laterality: N/A;  . Cystoscopy with retrograde pyelogram, ureteroscopy and stent placement Right 05/07/2013    Procedure: CYSTOSCOPY WITH RIGHT RETROGRADE PYELOGRAM, Balloon dilation of right ureter, Laser incision of right ureter, Nephrostogram;  Surgeon: Garnett Farm, MD;  Location: [facility];  Service: Urology;  Laterality: Right;  . Holmium laser application Right 05/07/2013    Procedure: HOLMIUM LASER APPLICATION;  Surgeon: Garnett Farm, MD;  Location: [facility];  Service: Urology;  Laterality: Right;  . Cystoscopy with retrograde pyelogram, ureteroscopy and stent placement Left 07/15/2014    Procedure: LEFT URETEROSCOPY RETROGRADE PYELOGRAM TRANSECTION OF BLADDER TUMOR WITH BLADDER LESION BIOPSIES;  Surgeon: Ihor Gully, MD;  Location: [facility];  Service: Urology;  Laterality: Left;   Family History  Problem Relation Age of Onset  . Heart disease Mother   . Early death Father   . Aneurysm Brother   . Aortic aneurysm Brother   . Diabetes Sister    History  Substance Use Topics  . Smoking status: Never Smoker   . Smokeless tobacco: Never Used  . Alcohol Use: No    Review of Systems  All other systems reviewed and are negative.     Allergies  Sulfa antibiotics  Home  Medications   Prior to Admission medications   Medication Sig Start Date End Date Taking? Authorizing Provider  Cyanocobalamin (VITAMIN B-12) 2000 MCG TBCR Take 2,000 mcg by mouth daily.    Yes Historical Provider, MD  furosemide (LASIX) 40 MG tablet Take 1.5 tablets (60 mg total) by mouth daily. Patient taking differently: Take 60 mg by mouth 2 (two) times daily.  08/05/14  Yes Jodelle Gross, NP  HYDROcodone-acetaminophen (NORCO/VICODIN) 5-325 MG per tablet Take one half to one tablet every 4 hours prn severe pain. Caution drowsiness. 08/08/14  Yes Babs Sciara, MD  simvastatin (ZOCOR) 20 MG tablet Take 20 mg by mouth daily.   Yes Historical Provider, MD  traZODone (DESYREL) 50 MG tablet Take 0.5 tablets (25 mg total) by mouth at bedtime. 07/25/14  Yes Merlyn Albert, MD  warfarin (COUMADIN) 5 MG tablet Take 2.5-5 mg by mouth See admin instructions. 1/2 tablet daily except none on Wednesdays   Yes  Historical Provider, MD  acetaminophen (TYLENOL) 500 MG tablet Take 1,000 mg by mouth every 6 (six) hours as needed for mild pain, moderate pain or headache.     Historical Provider, MD  colchicine (COLCRYS) 0.6 MG tablet TAKE ONE TABLET BY MOUTH 2 TIMES A DAY FOR GOUT. Patient taking differently: Take 0.6 mg by mouth daily as needed (Gout).  12/24/13   Merlyn Albert, MD  latanoprost (XALATAN) 0.005 % ophthalmic solution Place 1 drop into both eyes at bedtime.  08/12/12   Historical Provider, MD  metolazone (ZAROXOLYN) 5 MG tablet Take 1 tablet 5 mg for 2 days and then STOP Patient not taking: Reported on 09/04/14 08/05/14   Jodelle Gross, NP  metoprolol (LOPRESSOR) 100 MG tablet Take 1 tablet (100 mg total) by mouth 2 (two) times daily. 07/19/14   Jodelle Gross, NP   BP 81/49 mmHg  Pulse 61  Temp(Src) 97.5 F (36.4 C) (Oral)  Resp 19  SpO2 100% Physical Exam  Constitutional: He is oriented to person, place, and time. He appears well-developed and well-nourished.  HENT:  Head:  Normocephalic and atraumatic.  Right Ear: External ear normal.  Left Ear: External ear normal.  Nose: Nose normal.  Mouth/Throat: Oropharynx is clear and moist.  Eyes:  Conjunctiva are pale  Neck: Normal range of motion. Neck supple.  Cardiovascular: An irregularly irregular rhythm present.  Pulmonary/Chest: Effort normal and breath sounds normal.  Abdominal: Soft. Bowel sounds are normal.  Musculoskeletal:  Bilateral edema lower extremities. Right lower extremity has a weeping wound of the right lower leg. There is surrounding erythema extending down to the foot and up to the knee. The distal aspect of the right foot is cool and pale. I'm unable to palpate dorsal pedalis pulse but I am able to Doppler the dorsal pedalis pulse in the right and left foot. Posterior tibialis are palpable and on Doppler. Sensation is intact. Renal ostomy tube right back  Neurological: He is alert and oriented to person, place, and time. No cranial nerve deficit.  Skin: There is pallor.  Psychiatric: His behavior is normal. Thought content normal.  Nursing note and vitals reviewed.   ED Course  Procedures (including critical care time) Labs Review Labs Reviewed  BASIC METABOLIC PANEL - Abnormal; Notable for the following:    Sodium 134 (*)    CO2 16 (*)    Glucose, Bld 102 (*)    BUN 141 (*)    Creatinine, Ser 4.44 (*)    Calcium 8.0 (*)    GFR calc non Af Amer 11 (*)    GFR calc Af Amer 13 (*)    All other components within normal limits  BRAIN NATRIURETIC PEPTIDE - Abnormal; Notable for the following:    B Natriuretic Peptide 694.0 (*)    All other components within normal limits  CBC WITH DIFFERENTIAL/PLATELET - Abnormal; Notable for the following:    WBC 11.5 (*)    RBC 3.60 (*)    Hemoglobin 11.3 (*)    HCT 34.6 (*)    RDW 19.8 (*)    Platelets 107 (*)    Neutrophils Relative % 90 (*)    Lymphocytes Relative 4 (*)    Neutro Abs 10.3 (*)    Lymphs Abs 0.5 (*)    All other components  within normal limits  I-STAT CG4 LACTIC ACID, ED - Abnormal; Notable for the following:    Lactic Acid, Venous 2.27 (*)    All other components within  normal limits  CULTURE, BLOOD (ROUTINE X 2)  CULTURE, BLOOD (ROUTINE X 2)  URINE CULTURE  PROTIME-INR  URINALYSIS, ROUTINE W REFLEX MICROSCOPIC  BASIC METABOLIC PANEL  POC OCCULT BLOOD, ED    Imaging Review Dg Chest 2 View  08/08/2014   CLINICAL DATA:  81 year old male with a history of leg swelling  EXAM: CHEST - 2 VIEW  COMPARISON:  08/02/2014  FINDINGS: Cardiomediastinal silhouette unchanged. Atherosclerotic calcifications of the aortic arch.  Surgical changes of median sternotomy.  Dense opacity at the right base persists, unchanged from prior. No left pleural effusion identified.  No pneumothorax.  No displaced fracture.  Unremarkable appearance of the upper abdomen.  IMPRESSION: Persisting right pleural effusion with associated atelectasis. Infection difficult to exclude.  Atherosclerosis with evidence of median sternotomy.  Signed,  Yvone Neu. Loreta Ave, DO  [facility]  [facility]   Electronically Signed   By: Gilmer Mor D.O.   On: 08/06/2014 21:39     EKG Interpretation   Date/Time:  Sunday August 18 2014 20:29:16 EDT Ventricular Rate:  111 PR Interval:    QRS Duration: 130 QT Interval:  373 QTC Calculation: 507 R Axis:   29 Text Interpretation:  Atrial fibrillation Poor data quality No significant  change since last tracing Confirmed by Moss Berry MD, Duwayne Heck [phone]) on  08/20/2014 9:15:27 PM          MDM   81 year old male who presents with hypotension and wound to right lower extremity. On exam patient has peripheral edema, chronic skin changes and possible new infection right lower extremity. Foot is cool on right relative to left. Pulses are weak bilaterally. I have discussed with patient that he should have an evaluation for decreased circulation in his right foot. He refuses  transfer to another facility. He states that he wants to go home. I discussed with the patient that he is a very ill and is likely that the bleeding would result in death. He has friends at the bedside but no immediate family and he lives alone. He continues to adamantly refuse transfer to [facility]. I discussed with him staying here in the hospital. He is unclear at this time if he will stay. I have begun treatment and evaluation for sepsis.  I am starting with fluid at 1 liter as patient has a history of congestive heart failure and cardiomyopathy and does not wish intubation.  Filed Vitals:   07/29/2014 2033 08/09/2014 2100 08/15/2014 2152 08/20/2014 2200  BP:  90/61 76/63 100/75  Pulse:   113 93  Temp:   96.8 F (36 C)   TempSrc:   Rectal   Resp:  SpO2: 100%  95% 100%   Blood pressure improving here with iv fluids.    1- leg wound/ cellulitis- patient with elevated lactic acid and hypotension.  BP improving with iv fluids. Patient treated with Zosyn and vancomycin. 2 vascular insufficiency right foot/ischemia right foot-. Patient with cool distal right foot. Dorsal pedalis pulses intact. Patient refuses transfer for further evaluation.  Patient adamantly refuses admission to [facility] where vascular surgery is. I discussed this with Dr. Craige Cotta.  He feels that the patient will be appropriate for [facility] as he is currently not a surgical candidate given his hypotension and renal failure. 3 renal failure worsening renal failure with creatinine increased to 4.44 with last 4.41 and first one previous to that was 2.86.  Patient states he does not want dialysis. 4  pleural effusion/cannot rule out infiltrate 5 INR greater than 10- and vitamin K - no active bleeding noted at this time. However, given patient's tenuous hemodynamic status, I will actively reverse this extremely elevated INR. 6 atrial fibrillation stable  7 right renal ostomy tube with chronic obstruction  Patient  with limited IV access. He has a right antecubital IV. He is refusing further tests at IV access. I discussed the patient's care with Dr. Craige Cotta he will accept the patient to the [facility] intensive care unit. I have had extensive conversations with the patient about his CODE STATUS. He states that he does not want dialysis. He has waxed and waned regarding further intervention such as intubation. At this time he remains a full code.  CRITICAL CARE Performed by: Hilario Quarry Total critical care time: 100 Critical care time was exclusive of separately billable procedures and treating other patients. Critical care was necessary to treat or prevent imminent or life-threatening deterioration. Critical care was time spent personally by me on the following activities: development of treatment plan with patient and/or surrogate as well as nursing, discussions with consultants, evaluation of patient's response to treatment, examination of patient, obtaining history from patient or surrogate, ordering and performing treatments and interventions, ordering and review of laboratory studies, ordering and review of radiographic studies, pulse oximetry and re-evaluation of patient's condition.   Margarita Grizzle, MD 08/07/2014 [phone]

## 2014-08-19 ENCOUNTER — Inpatient Hospital Stay (HOSPITAL_COMMUNITY): Payer: Medicare Other

## 2014-08-19 DIAGNOSIS — A419 Sepsis, unspecified organism: Principal | ICD-10-CM

## 2014-08-19 DIAGNOSIS — L03115 Cellulitis of right lower limb: Secondary | ICD-10-CM

## 2014-08-19 DIAGNOSIS — R6521 Severe sepsis with septic shock: Secondary | ICD-10-CM

## 2014-08-19 DIAGNOSIS — N390 Urinary tract infection, site not specified: Secondary | ICD-10-CM

## 2014-08-19 DIAGNOSIS — N179 Acute kidney failure, unspecified: Secondary | ICD-10-CM

## 2014-08-19 LAB — MRSA PCR SCREENING: MRSA by PCR: NEGATIVE

## 2014-08-19 LAB — CBC
HEMATOCRIT: 33.7 % — AB (ref 39.0–52.0)
Hemoglobin: 11.1 g/dL — ABNORMAL LOW (ref 13.0–17.0)
MCH: 31.3 pg (ref 26.0–34.0)
MCHC: 32.9 g/dL (ref 30.0–36.0)
MCV: 94.9 fL (ref 78.0–100.0)
PLATELETS: 132 10*3/uL — AB (ref 150–400)
RBC: 3.55 MIL/uL — ABNORMAL LOW (ref 4.22–5.81)
RDW: 19.7 % — AB (ref 11.5–15.5)
WBC: 16.6 10*3/uL — AB (ref 4.0–10.5)

## 2014-08-19 LAB — BASIC METABOLIC PANEL
Anion gap: 10 (ref 5–15)
BUN: 127 mg/dL — ABNORMAL HIGH (ref 6–23)
CHLORIDE: 106 mmol/L (ref 96–112)
CO2: 16 mmol/L — ABNORMAL LOW (ref 19–32)
Calcium: 7.5 mg/dL — ABNORMAL LOW (ref 8.4–10.5)
Creatinine, Ser: 4.11 mg/dL — ABNORMAL HIGH (ref 0.50–1.35)
GFR calc non Af Amer: 12 mL/min — ABNORMAL LOW (ref >90)
GFR, EST AFRICAN AMERICAN: 14 mL/min — AB (ref >90)
GLUCOSE: 107 mg/dL — AB (ref 70–99)
POTASSIUM: 4.3 mmol/L (ref 3.5–5.1)
Sodium: 132 mmol/L — ABNORMAL LOW (ref 135–145)

## 2014-08-19 LAB — LACTIC ACID, PLASMA: Lactic Acid, Venous: 2 mmol/L (ref 0.5–2.0)

## 2014-08-19 LAB — PROTIME-INR
INR: 3.12 — AB (ref 0.00–1.49)
Prothrombin Time: 32.3 seconds — ABNORMAL HIGH (ref 11.6–15.2)

## 2014-08-19 LAB — CK: CK TOTAL: 87 U/L (ref 7–232)

## 2014-08-19 MED ORDER — SIMVASTATIN 20 MG PO TABS
20.0000 mg | ORAL_TABLET | Freq: Every day | ORAL | Status: DC
  Administered 2014-08-19 – 2014-08-22 (×4): 20 mg via ORAL
  Filled 2014-08-19 (×3): qty 2
  Filled 2014-08-19: qty 1

## 2014-08-19 MED ORDER — LATANOPROST 0.005 % OP SOLN
1.0000 [drp] | Freq: Every day | OPHTHALMIC | Status: DC
  Administered 2014-08-19 – 2014-08-30 (×12): 1 [drp] via OPHTHALMIC
  Filled 2014-08-19 (×4): qty 2.5

## 2014-08-19 MED ORDER — PHENYLEPHRINE HCL 10 MG/ML IJ SOLN
30.0000 ug/min | INTRAVENOUS | Status: DC
  Administered 2014-08-19: 10 ug/min via INTRAVENOUS
  Administered 2014-08-19: 50 ug/min via INTRAVENOUS
  Administered 2014-08-20: 100 ug/min via INTRAVENOUS
  Filled 2014-08-19 (×3): qty 1

## 2014-08-19 MED ORDER — ACETAMINOPHEN 500 MG PO TABS
1000.0000 mg | ORAL_TABLET | Freq: Four times a day (QID) | ORAL | Status: DC | PRN
  Administered 2014-08-24: 1000 mg via ORAL
  Filled 2014-08-19: qty 2

## 2014-08-19 MED ORDER — SODIUM CHLORIDE 0.9 % IV BOLUS (SEPSIS)
1000.0000 mL | Freq: Once | INTRAVENOUS | Status: AC
  Administered 2014-08-19: 1000 mL via INTRAVENOUS

## 2014-08-19 MED ORDER — VITAMIN B-12 1000 MCG PO TABS
1000.0000 ug | ORAL_TABLET | Freq: Every day | ORAL | Status: DC
  Administered 2014-08-19 – 2014-08-30 (×12): 1000 ug via ORAL
  Filled 2014-08-19 (×12): qty 1

## 2014-08-19 MED ORDER — VANCOMYCIN HCL 500 MG IV SOLR
INTRAVENOUS | Status: AC
  Filled 2014-08-19: qty 500

## 2014-08-19 MED ORDER — HYDROCODONE-ACETAMINOPHEN 5-325 MG PO TABS
0.5000 | ORAL_TABLET | Freq: Four times a day (QID) | ORAL | Status: DC | PRN
  Administered 2014-08-19 – 2014-08-30 (×14): 1 via ORAL
  Filled 2014-08-19 (×15): qty 1

## 2014-08-19 MED ORDER — SODIUM CHLORIDE 0.9 % IV SOLN
INTRAVENOUS | Status: DC
  Administered 2014-08-19: 50 mL via INTRAVENOUS
  Administered 2014-08-20 – 2014-08-25 (×3): via INTRAVENOUS

## 2014-08-19 MED ORDER — PIPERACILLIN-TAZOBACTAM IN DEX 2-0.25 GM/50ML IV SOLN
2.2500 g | Freq: Three times a day (TID) | INTRAVENOUS | Status: DC
  Administered 2014-08-19 – 2014-08-21 (×8): 2.25 g via INTRAVENOUS
  Filled 2014-08-19 (×9): qty 50

## 2014-08-19 NOTE — Progress Notes (Signed)
ANTICOAGULATION CONSULT NOTE - Initial Consult  Pharmacy Consult for warfarin Indication: atrial fibrillation  Allergies  Allergen Reactions  . Sulfa Antibiotics Rash    Nausea    Patient Measurements: Height: 5\' 10"  (177.8 cm) Weight: 190 lb 7.6 oz (86.4 kg) IBW/kg (Calculated) : 73  Vital Signs: Temp: 97.5 F (36.4 C) (04/25 0800) Temp Source: Oral (04/25 0800) BP: 77/56 mmHg (04/25 1000) Pulse Rate: 154 (04/25 1000)  Labs:  Recent Labs  08/19/14 2058 08/19/14 0745  HGB 11.3* 11.1*  HCT 34.6* 33.7*  PLT 107* 132*  LABPROT >90.0* 32.3*  INR >10.00* 3.12*  CREATININE 4.44* 4.11*    Estimated Creatinine Clearance: 13.3 mL/min (by C-G formula based on Cr of 4.11).   Medical History: Past Medical History  Diagnosis Date  . Hyperlipidemia   . White coat hypertension   . Cardiomyopathy      EF:30-40% in 06/1998, but 50-55% in 2009;  HX ISCHEMIC CARDIOMYOPATHY  . Chronic atrial fibrillation   . Vitamin B12 deficiency   . Chronic anticoagulation   . Arthritis   . Sensory neuropathy   . Chronic kidney disease, stage III (moderate)     creatinine 1.5 2001, 2.0 2009, 2.28 in 12/2009  . Mild renal insufficiency   . Hydronephrosis, bilateral   . Ureteral calculi     BILATERAL  . History of CHF (congestive heart failure)     1999  &  2005  . History of kidney stones   . Bilateral renal cysts   . 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 Chronic venous insufficiency   . PAD (peripheral artery disease)   . Edema of both legs   . History of gout     per pt stable  . Glaucoma of both eyes   . Wears hearing aid     bilateral  . CHF (congestive heart failure)   . Shortness of breath dyspnea   . Arrhythmia     Afib CHADS VASC Score of 5.    Medications:  Scheduled:  . latanoprost  1 drop Both Eyes QHS  . piperacillin-tazobactam (ZOSYN)  IV  2.25 g  Intravenous Q8H  . simvastatin  20 mg Oral Daily  . sodium chloride  600 mL Intravenous Once  . vitamin B-12  1,000 mcg Oral Daily    Assessment: 81yo M presented to [facility] w/ leg swelling, suspected cellulitis, elev SCr, and supratherapeutic INR. Pharmacy was consulted to start Vanc and Zosyn.  4/25: INR improved to 3.12 after vit.K 10mg . Recently course of Vantin could have contributed to supratherapeutic INR. Denies significant changes in dietary intake. I confirmed his most recent warfarin regimen with him: 2.5mg  daily except nothing on Wednesdays.  H/H is low but stable. Platelets are low but improved. No bleeding reported/documented.   Goal of Therapy:  INR 2-3 Monitor platelets by anticoagulation protocol: Yes   Plan:  No warfarin today.  Check PT/INR in the AM.   Charolotte Eke, PharmD, pager [phone]. 08/19/2014,11:14 AM.

## 2014-08-19 NOTE — Progress Notes (Signed)
eLink Physician-Brief Progress Note Patient Name: [name] DOB: 07/09/1925 MRN: [id number]   Date of Service  08/19/2014  HPI/Events of Note  After d/w APP and pt, decision was made that pt would be DNR.    Pt's sister called for update and spoke with bedside RN.  After this discussion pt decided he now want to be resuscitated, but does not want dialysis.  eICU Interventions  Will change back to full code status for now.  Will need to arrange meeting with pt and his family so that they can better understand the chronic nature of his health problems, which will hopefully allow them to make more informed decisions about goals of care.      Intervention Category Major Interventions: Other:  [name] 08/19/2014, 5:00 AM

## 2014-08-19 NOTE — Progress Notes (Signed)
CARE MANAGEMENT NOTE 08/19/2014  Patient:  VAIL, ARMOR   Account Number:  [id number]  Date Initiated:  08/19/2014  Documentation initiated by:  Coner Gibbard  Subjective/Objective Assessment:   sepsis in 81 year old male     Action/Plan:   from home   Anticipated DC Date:  08/22/2014   Anticipated DC Plan:  HOME/SELF CARE  In-house referral  NA      DC Planning Services  CM consult      Choice offered to / List presented to:             Status of service:  In process, will continue to follow Medicare Important Message given?   (If response is "NO", the following Medicare IM given date fields will be blank) Date Medicare IM given:   Medicare IM given by:   Date Additional Medicare IM given:   Additional Medicare IM given by:    Discharge Disposition:    Per UR Regulation:  Reviewed for med. necessity/level of care/duration of stay  If discussed at Long Length of Stay Meetings, dates discussed:    Comments:  August 19, 2014/Mauriah Mcmillen L. Earlene Plater, RN, BSN, CCM. Case Management [facility] [phone] No discharge needs present of time of review.

## 2014-08-19 NOTE — Progress Notes (Signed)
E-Link MD notified of patient's low blood pressure of 71/30 (55), no new order received. Pt alert and oriented, in no observable acute distress. Pt will continue to be monitored thru this shift for any sign of acute pain or distress.

## 2014-08-19 NOTE — Progress Notes (Signed)
After the discussion with patient's sister about patient's decision to be a DNR; I went into the patient's room with the Administrative Coordinator on duty, patient was asked about his desire if his heart stop beating and or if he stops breathing, he made it clear [place] that he wants everything done except for dialysis. Dr Craige Cotta made aware of this new development, order was received for patient to be a 'full code'.

## 2014-08-19 NOTE — H&P (Deleted)
PULMONARY / CRITICAL CARE MEDICINE   Name: [name] MRN: [id number] DOB: March 19, 1926    ADMISSION DATE:  09/08/2014  REFERRING MD :  Jeani Hawking EDP  CHIEF COMPLAINT:  Sepsis   INITIAL PRESENTATION: 81 y/o male with extensive PMH including Afib on coumadin, CKD III, chronic sCHF (EF 35%), PAD presented 4/24 to [facility] ED with c/o RLE wound with concern for ischemia.  Also found to have acute on CKD, elevated lactate and borderline BP and was tx to [facility] to PCCM with ?sepsis.    Of note pt has been clear in the past that he would never want dialysis.   STUDIES:  CXR 4/24 >> unchanged persistent/chronic R pleural effusion.   SIGNIFICANT EVENTS: 4/24  Admit with hypotension, BLE pain, acute on chronic CKD, elevated lactate, hypotension  4/25  Remains on 10 mcg levo, BP in 70's with excellent mental status   SUBJECTIVE: Pt reports only complaint in BLE pain L>R.  He indicates he recently cut in RLE with his foley leg bag and has had a wound since.  He also fell and sprained his R ankle (noted bruising to medial lower ankle)  VITAL SIGNS: Temp:  [96.8 F (36 C)-97.9 F (36.6 C)] 97.5 F (36.4 C) (04/25 0800) Pulse Rate:  [28-165] 154 (04/25 1000) Resp:  [13-26] 22 (04/25 1000) BP: (56-166)/(38-75) 77/56 mmHg (04/25 1000) SpO2:  [82 %-100 %] 99 % (04/25 1000) Weight:  [190 lb 7.6 oz (86.4 kg)] 190 lb 7.6 oz (86.4 kg) (04/25 0223)   HEMODYNAMICS:     VENTILATOR SETTINGS:     INTAKE / OUTPUT:  Intake/Output Summary (Last 24 hours) at 08/19/14 1035 Last data filed at 08/19/14 0800  Gross per 24 hour  Intake 2037.51 ml  Output    300 ml  Net 1737.51 ml    PHYSICAL EXAMINATION: General:  Pleasant elderly male, NAD  Neuro:  Awake, alert, appropriate, MAE  HEENT:  Mm dry, no JVD  Cardiovascular:  s1s2 irregularly irregular, afib on monitor  Lungs:  resps even/non labored on [place], slightly diminished R base, otherwise cta  Abdomen:  Round, soft, +bs, non tender   Musculoskeletal:  BLE 2+ pitting edema, erythema, R>L cellulitis, R foot cool with POS pulses, ecchymosis noted (subj hx of fall with sprain)   LABS:  CBC  Recent Labs Lab 09-08-14 2058 08/19/14 0745  WBC 11.5* 16.6*  HGB 11.3* 11.1*  HCT 34.6* 33.7*  PLT 107* 132*   Coag's  Recent Labs Lab 2014/09/08 2058 08/19/14 0745  INR >10.00* 3.12*   BMET  Recent Labs Lab 08/14/14 0706 09/08/14 2058  NA 136 134*  K 4.7 4.5  CL 101 104  CO2 19 16*  BUN 124* 141*  CREATININE 4.41* 4.44*  GLUCOSE 76 102*   Electrolytes  Recent Labs Lab 08/14/14 0706 2014-09-08 2058  CALCIUM 7.9* 8.0*   Sepsis Markers  Recent Labs Lab 09-08-2014 2123  LATICACIDVEN 2.27*   ABG No results for input(s): PHART, PCO2ART, PO2ART in the last 168 hours. Liver Enzymes No results for input(s): AST, ALT, ALKPHOS, BILITOT, ALBUMIN in the last 168 hours. Cardiac Enzymes No results for input(s): TROPONINI, PROBNP in the last 168 hours. Glucose No results for input(s): GLUCAP in the last 168 hours.  Imaging Dg Chest 2 View  Sep 08, 2014   CLINICAL DATA:  81 year old male with a history of leg swelling  EXAM: CHEST - 2 VIEW  COMPARISON:  08/02/2014  FINDINGS: Cardiomediastinal silhouette unchanged. Atherosclerotic calcifications of  the aortic arch.  Surgical changes of median sternotomy.  Dense opacity at the right base persists, unchanged from prior. No left pleural effusion identified.  No pneumothorax.  No displaced fracture.  Unremarkable appearance of the upper abdomen.  IMPRESSION: Persisting right pleural effusion with associated atelectasis. Infection difficult to exclude.  Atherosclerosis with evidence of median sternotomy.  Signed,  Yvone Neu. Loreta Ave, DO  [facility]  [facility]   Electronically Signed   By: Gilmer Mor D.O.   On: 08/20/2014 21:39     ASSESSMENT / PLAN:  R pleural effusion - chronic.  Likely r/t CHF.  P: Trend CXR   Supplemental O2 as needed for sats > 90% Pulmonary hygiene  Severe Sepsis - likely r/t cellulitis sCHF  AFib - on chronic coumadin PVD P:  Repeat lactate, BMP now Levophed for MAP >65 Monitor BP, ? If accurate readings with cuff and hx of PVD Hold home lasix for now with hypotension  Trend BNP Hold home lopressor - may need additional rx for rate control   Acute on CKD III - baseline Scr ~2.6 Elevated Lactic Acid P:   Pt would NOT want dialysis  Trend BMP Replace electrolytes as indicated  Increase NS @ 75 ml/hr, monitor closely with CHF hx  Coagulopathy - INR >10, recent abx ~ 5 weeks pta Thrombocytopenia - S/p FFP, Vit K  P:  Trend CBC, Coags Restart coumadin per pharmacy 4/25  BLE Cellulitis RLE Wound - injured with leg bag UTI  P:   BCx2 4/24 >> UC 4/24 >> UA 4/24 >> positive for UTI (wbc tntc, mod leuks, many bacteria)  Vancomycin 4/25>>> Zosyn 4/25>>>   GOALS of CARE:  Discussion had overnight with patient regarding GOC.  He initially wanted to be a DNR.  His sister became involved in his care and code status was reversed.  Clarified with patient and niece in the room.  He wishes for medical care that would continue to allow him to remain independent.  He does not want dialysis under any circumstance.  Discussed CPR with the patient and he would not want CPR in the event of arrest.  Further discussed mechanical ventilation with patient and he would be open to MV support pending the reason he would require it.  Ex: if he had a catastrophic stroke, he would not want it and would desire natural death.  However, if a reversible process he would be in favor of support if needed.    Canary Brim, NP-C [facility] Pgr: [phone] or [phone]  08/19/2014  10:35 AM

## 2014-08-19 NOTE — Progress Notes (Signed)
PULMONARY / CRITICAL CARE MEDICINE   Name: [name] MRN: [id number] DOB: 07-05-1925    ADMISSION DATE:  08-22-2014  REFERRING MD :  Jeani Hawking EDP  CHIEF COMPLAINT:  Sepsis   INITIAL PRESENTATION: 81 y/o male with extensive PMH including Afib on coumadin, CKD III, chronic sCHF (EF 35%), PAD presented 4/24 to [facility] ED with c/o RLE wound with concern for ischemia.  Also found to have acute on CKD, elevated lactate and borderline BP and was tx to [facility] to PCCM with ?sepsis.    Of note pt has been clear in the past that he would never want dialysis.   STUDIES:  CXR 4/24 >> unchanged persistent/chronic R pleural effusion.   SIGNIFICANT EVENTS: 4/24  Admit with hypotension, BLE pain, acute on chronic CKD, elevated lactate, hypotension  4/25  Remains on 10 mcg levo, BP in 70's with excellent mental status   SUBJECTIVE: Pt reports only complaint in BLE pain L>R.  He indicates he recently cut in RLE with his foley leg bag and has had a wound since.  He also fell and sprained his R ankle (noted bruising to medial lower ankle)  VITAL SIGNS: Temp:  [96.8 F (36 C)-97.9 F (36.6 C)] 97.5 F (36.4 C) (04/25 0800) Pulse Rate:  [28-165] 154 (04/25 1000) Resp:  [13-26] 22 (04/25 1000) BP: (56-166)/(38-75) 77/56 mmHg (04/25 1000) SpO2:  [82 %-100 %] 99 % (04/25 1000) Weight:  [190 lb 7.6 oz (86.4 kg)] 190 lb 7.6 oz (86.4 kg) (04/25 0223)   HEMODYNAMICS:     VENTILATOR SETTINGS:     INTAKE / OUTPUT:  Intake/Output Summary (Last 24 hours) at 08/19/14 1055 Last data filed at 08/19/14 0800  Gross per 24 hour  Intake 2037.51 ml  Output    300 ml  Net 1737.51 ml    PHYSICAL EXAMINATION: General:  Pleasant elderly male, NAD  Neuro:  Awake, alert, appropriate, MAE  HEENT:  Mm dry, no JVD  Cardiovascular:  s1s2 irregularly irregular, afib on monitor  Lungs:  resps even/non labored on [place], slightly diminished R base, otherwise cta  Abdomen:  Round, soft, +bs, non tender   Musculoskeletal:  BLE 2+ pitting edema, erythema, R>L cellulitis, R foot cool with POS pulses, ecchymosis noted (subj hx of fall with sprain)   LABS:  CBC  Recent Labs Lab 08-22-2014 2058 08/19/14 0745  WBC 11.5* 16.6*  HGB 11.3* 11.1*  HCT 34.6* 33.7*  PLT 107* 132*   Coag's  Recent Labs Lab Aug 22, 2014 2058 08/19/14 0745  INR >10.00* 3.12*   BMET  Recent Labs Lab 08/14/14 0706 August 22, 2014 2058 08/19/14 0745  NA 136 134* 132*  K 4.7 4.5 4.3  CL 101 104 106  CO2 19 16* 16*  BUN 124* 141* PENDING  CREATININE 4.41* 4.44* 4.11*  GLUCOSE 76 102* 107*   Electrolytes  Recent Labs Lab 08/14/14 0706 08-22-14 2058 08/19/14 0745  CALCIUM 7.9* 8.0* 7.5*   Sepsis Markers  Recent Labs Lab 22-Aug-2014 2123  LATICACIDVEN 2.27*   ABG No results for input(s): PHART, PCO2ART, PO2ART in the last 168 hours. Liver Enzymes No results for input(s): AST, ALT, ALKPHOS, BILITOT, ALBUMIN in the last 168 hours. Cardiac Enzymes No results for input(s): TROPONINI, PROBNP in the last 168 hours. Glucose No results for input(s): GLUCAP in the last 168 hours.  Imaging Dg Chest 2 View  08-22-2014   CLINICAL DATA:  81 year old male with a history of leg swelling  EXAM: CHEST - 2 VIEW  COMPARISON:  08/02/2014  FINDINGS: Cardiomediastinal silhouette unchanged. Atherosclerotic calcifications of the aortic arch.  Surgical changes of median sternotomy.  Dense opacity at the right base persists, unchanged from prior. No left pleural effusion identified.  No pneumothorax.  No displaced fracture.  Unremarkable appearance of the upper abdomen.  IMPRESSION: Persisting right pleural effusion with associated atelectasis. Infection difficult to exclude.  Atherosclerosis with evidence of median sternotomy.  Signed,  Yvone Neu. Loreta Ave, DO  [facility]  [facility]   Electronically Signed   By: Gilmer Mor D.O.   On: 08/21/2014 21:39     ASSESSMENT / PLAN:  R  pleural effusion - chronic.  Likely r/t CHF.  P: Trend CXR  Supplemental O2 as needed for sats > 90% Pulmonary hygiene  Severe Sepsis - likely r/t cellulitis sCHF  AFib - on chronic coumadin PVD P:  Repeat lactate, BMP now Levophed for MAP >65 Monitor BP, ? If accurate readings with cuff and hx of PVD Hold home lasix for now with hypotension  Trend BNP Hold home lopressor - may need additional rx for rate control   Acute on CKD III - baseline Scr ~2.6 Elevated Lactic Acid P:   Pt would NOT want dialysis  Trend BMP Replace electrolytes as indicated  Increase NS @ 75 ml/hr, monitor closely with CHF hx  Coagulopathy - INR >10, recent abx ~ 5 weeks pta Thrombocytopenia - S/p FFP, Vit K  P:  Trend CBC, Coags Restart coumadin per pharmacy 4/25  BLE Cellulitis RLE Wound - injured with leg bag UTI  P:   BCx2 4/24 >> UC 4/24 >> UA 4/24 >> positive for UTI (wbc tntc, mod leuks, many bacteria)  Vancomycin 4/25>>> Zosyn 4/25>>>  WOC for LE wounds  Pain - BLE  Deconditioning   P: PT consult for deconditioning, hx of falls  Activity as tolerated    GOALS of CARE:  Discussion had overnight with patient regarding GOC.  He initially wanted to be a DNR.  His sister became involved in his care and code status was reversed.  Clarified with patient and niece in the room.  He wishes for medical care that would continue to allow him to remain independent.  He does not want dialysis under any circumstance.  Discussed CPR with the patient and he would not want CPR in the event of arrest.  Further discussed mechanical ventilation with patient and he would be open to MV support pending the reason he would require it.  Ex: if he had a catastrophic stroke, he would not want it and would desire natural death.  However, if a reversible process he would be in favor of support if needed.    Canary Brim, NP-C [place] Pgr: [phone] or [phone]  08/19/2014  10:55  AM  Attending:  I have seen and examined the patient with nurse practitioner/resident and agree with the note above.   [name] was transferred to our facility yesterday for possible ischemia of his right lower extremity. Physical exam is consistent with bruising secondary to her recent sprain of his ankle as well as a large bruise where he had a skin tear. He has normal pulses in his leg is warm so there is no evidence of ischemia at this time. I will check a CK to ensure there is no evidence of myositis. Pictures this point is consistent with septic shock in the setting of a urinary tract infection and cellulitis of the left  leg. Overall he has improved significantly. His lactic acid is clearing. I do not see indication for surgical intervention on his leg at this time but we will need to keep a close eye on the leg.  CODE STATUS conversation as detailed above, clarified with family on the morning of April 25.  My critical care time 57 minutes  Heber Kupreanof, MD [place] [facility] Pager: [phone] Cell: [phone] If no response, call [phone]

## 2014-08-19 NOTE — Progress Notes (Signed)
eLink Physician-Brief Progress Note Patient Name: [name] DOB: 06-08-1925 MRN: [id number]   Date of Service  08/19/2014  HPI/Events of Note  Low BP.  eICU Interventions  Will give fluid bolus.  Need to have further d/w family in AM about appropriate goals of care.      Intervention Category Major Interventions: Other:  Stephaney Steven 08/19/2014, 4:43 AM

## 2014-08-19 NOTE — Progress Notes (Signed)
Returned patients sister Buzzy Han) call, who called me to get update on patients condition. Sister was informed of patient's intention to be a DNR, she was also updated on patient's declinning blood pressures. Patients sister voiced concern and objected to patient's DNR status saying, patient would want everything done to keep him alive. Patient sister was informed that patient is alert and oriented and is capable of making decisions. I informed the sister that if she is questioning the patient's decision she could come to the unit and speak with her brother about it. Dr. Craige Cotta made aware of this conversation.

## 2014-08-19 NOTE — Consult Note (Signed)
WOC wound consult note Reason for Consult:Patient with injury approximately 3 weeks ago from drainage port on leg bag. Has fallen since, but indicated that wound was there prior to fall. Granddaughter visiting in room at the toime f my consultation and corroborates patient's version. Wound type: Medical device related pressure injury Pressure Ulcer POA: Yes/No Measurement: 4cm x 3cm with no depth Wound QMV:HQION stable eschar covered the injury site (Unstageable). Drainage (amount, consistency, odor) Old dressing is damp with serosanguinous drainage, but there is no fresh exudate. Periwound:Surrounding edema, erythema and a satellite lesion at 12 o'clock measuring 1cm x 0.8cm with a 100% yellow base. Dressing procedure/placement/frequency: I have asked nursing to protect the periwound skin with a moisture barrier cream and to top the eschar with a saline moistened dressing and to secure with Kerlix roll gauze, not tape. This area could be a hematoma and might benefit from evacuation or a fracture.  If you agree, please consult with [facility] for a surgical consult. WOC nursing team will not follow, but will remain available to this patient, the nursing and medical team.  Please re-consult if needed. Thanks, Ladona Mow, MSN, RN, GNP, [place], CWON-AP [phone])

## 2014-08-19 NOTE — Progress Notes (Addendum)
INITIAL NUTRITION ASSESSMENT  DOCUMENTATION CODES Per approved criteria  -Non-severe (moderate) malnutrition in the context of chronic illness  Pt meets criteria for moderate MALNUTRITION in the context of chronic illness as evidenced by moderate fat and muscle depletion.  INTERVENTION: Provide Magic cup TID with meals, each supplement provides 290 kcal and 9 grams of protein Encourage PO intake  NUTRITION DIAGNOSIS: Increased nutrient needs related to wound healing as evidenced by two stage I pressure ulcers, wounds on lower legs  .   Goal: Pt to meet >/= 90% of their estimated nutrition needs   Monitor:  PO and supplemental intake, GOC, weight, labs, I/O's  Reason for Assessment: Braden score of 12 or less  Admitting Dx: Sepsis  ASSESSMENT: 81yo male with extensive PMH including Afib on coumadin, CKD III, chronic sCHF (EF 35%), PAD presented 4/24 to [facility] ED with c/o RLE wound with concern for ischemia. Also found to have acute on CKD, elevated lactate and borderline BP and was tx to [facility] to PCCM with ?sepsis.   Pt in room with granddaughter at bedside. Pt provided limited history. Pt was preoccupied with his meal tray. Pt would like RD to order his dinner since he did not like his lunch, RD to order. Encouraged pt to consume protein foods with his meals and snacks.   Pt reports he drinks Boost drinks at home but declines them while in the hospital. RD to order Magic Cups TID.  Pt's weight has remained stable. Nutrition Focused Physical Exam:  Subcutaneous Fat:  Orbital Region: WNL Upper Arm Region: moderate depletion Thoracic and Lumbar Region: NA  Muscle:  Temple Region: mild-moderate depletion Clavicle Bone Region: mild depletion Clavicle and Acromion Bone Region: mild depletion Scapular Bone Region: WNL Dorsal Hand: mild depletion Patellar Region: edematous Anterior Thigh Region: WNL Posterior Calf Region: edematous  Edema: +4 RLE & +4 LLE  -weeping  Labs reviewed: Low Na Elevated BUN & Creatinine  Height: Ht Readings from Last 1 Encounters:  08/19/14 5\' 10"  (1.778 m)    Weight: Wt Readings from Last 1 Encounters:  08/19/14 190 lb 7.6 oz (86.4 kg)    Ideal Body Weight: 166 lb  % Ideal Body Weight: 114%  Wt Readings from Last 10 Encounters:  08/19/14 190 lb 7.6 oz (86.4 kg)  08/07/14 192 lb 8 oz (87.317 kg)  08/02/14 189 lb (85.73 kg)  07/19/14 193 lb (87.544 kg)  07/12/14 193 lb (87.544 kg)  07/04/14 189 lb 6 oz (85.9 kg)  07/01/14 193 lb 9.6 oz (87.816 kg)  07/01/14 194 lb (87.998 kg)  06/26/14 193 lb 8 oz (87.771 kg)  06/04/14 190 lb (86.183 kg)    Usual Body Weight: 190 lb  % Usual Body Weight: 100%  BMI:  Body mass index is 27.33 kg/(m^2).  Estimated Nutritional Needs: Kcal: 1900-2100 Protein: 100-110g Fluid: 1.9L/day  Skin: Two Stage I pressure ulcers, wounds on lower legs  Diet Order: Diet clear liquid Room service appropriate?: Yes; Fluid consistency:: Thin  EDUCATION NEEDS: -No education needs identified at this time   Intake/Output Summary (Last 24 hours) at 08/19/14 0945 Last data filed at 08/19/14 0800  Gross per 24 hour  Intake 2037.51 ml  Output    300 ml  Net 1737.51 ml    Last BM: 4/24  Labs:   Recent Labs Lab 08/14/14 0706 07/27/2014 2058  NA 136 134*  K 4.7 4.5  CL 101 104  CO2 19 16*  BUN 124* 141*  CREATININE 4.41* 4.44*  CALCIUM 7.9* 8.0*  GLUCOSE 76 102*    CBG (last 3)  No results for input(s): GLUCAP in the last 72 hours.  Scheduled Meds: . latanoprost  1 drop Both Eyes QHS  . piperacillin-tazobactam (ZOSYN)  IV  2.25 g Intravenous Q8H  . simvastatin  20 mg Oral Daily  . sodium chloride  600 mL Intravenous Once  . vitamin B-12  1,000 mcg Oral Daily    Continuous Infusions: . sodium chloride 50 mL (08/19/14 0248)  . norepinephrine (LEVOPHED) Adult infusion 10 mcg/min (08/19/14 6962)    Past Medical History  Diagnosis Date  .  Hyperlipidemia   . White coat hypertension   . Cardiomyopathy      EF:30-40% in 06/1998, but 50-55% in 2009;  HX ISCHEMIC CARDIOMYOPATHY  . Chronic atrial fibrillation   . Vitamin B12 deficiency   . Chronic anticoagulation   . Arthritis   . Sensory neuropathy   . Chronic kidney disease, stage III (moderate)     creatinine 1.5 2001, 2.0 2009, 2.28 in 12/2009  . Mild renal insufficiency   . Hydronephrosis, bilateral   . Ureteral calculi     BILATERAL  . History of CHF (congestive heart failure)     1999  &  2005  . History of kidney stones   . Bilateral renal cysts   . 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 Chronic venous insufficiency   . PAD (peripheral artery disease)   . Edema of both legs   . History of gout     per pt stable  . Glaucoma of both eyes   . Wears hearing aid     bilateral  . CHF (congestive heart failure)   . Shortness of breath dyspnea   . Arrhythmia     Afib CHADS VASC Score of 5.    Past Surgical History  Procedure Laterality Date  . Repair thoracic aorta  01/1995    s/p rupture  . Cataract extraction w/ intraocular lens  implant, bilateral  2012  . 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 Laparoscopic cholecystectomy  JAN 2005  . Percutaneous nephrostolithotomy  1970's  . Abdominal aortagram  07-16-2003  DR ROTHBART    W/ COIL EMBOLIZATION OF SIX BRANCHES OF RIGHT INTERNAL RENAL ARTERY ANEURYSM  . Cardiovascular stress test  04-25-2003    LOW RISK CARDIOLITE STUDY/ MODERATE LV DILATATION AND IMPAIRMENT OF LVSF/  NO ISCHEMIA  . Transthoracic echocardiogram  02-07-2008  DR ROTHBART    MODERATE DILATED LV/  EF 50-55%/ MILD AR/ MILD TO MODERATE AORTIC ROOT DILATATION/ MODERATE MR/ MODERATE LA   &  RA   DIILATED/ MILD TO MODERATE TR  . Cystoscopy w/ ureteral stent placement Bilateral  10/24/2012    Procedure: CYSTOSCOPY WITH RETROGRADE PYELOGRAM/URETERAL STENT PLACEMENT;  Surgeon: Lindaann Slough, MD;  Location: [facility];  Service: Urology;  Laterality: Bilateral;  . Cystoscopy with ureteroscopy Left 11/27/2012    Procedure: CYSTOSCOPY,LEFT URETEROSCOPY WITH LASER LITHOTRIPSY/REMOVAL OF MIGRATED STENT, PLACEMENT OF LEFT STENT;  Surgeon: Garnett Farm, MD;  Location: [facility];  Service: Urology;  Laterality: Left;  . Holmium laser application Left 11/27/2012    Procedure: HOLMIUM LASER APPLICATION;  Surgeon: Garnett Farm, MD;  Location: [facility];  Service: Urology;  Laterality: Left;  . Cystoscopy with  ureteroscopy and stent placement N/A 12/29/2012    Procedure: CYSTOSCOPY WITH URETEROSCOPY AND STENT PLACEMENT, removal of right and left stents, retrogrades, right stent exchange;  Surgeon: Garnett Farm, MD;  Location: [facility];  Service: Urology;  Laterality: N/A;  . Holmium laser application Right 12/29/2012    Procedure: HOLMIUM LASER APPLICATION;  Surgeon: Garnett Farm, MD;  Location: [facility];  Service: Urology;  Laterality: Right;  HLL OF (RT) URETERAL PELVIC JUNCTION STONE  . Cystoscopy with retrograde pyelogram, ureteroscopy and stent placement Right 01/29/2013    Procedure: CYSTOSCOPY WITH RIGHT URETEROSCOPY AND STENT PLACEMENT;  Surgeon: Garnett Farm, MD;  Location: [facility];  Service: Urology;  Laterality: Right;  . Holmium laser application N/A 01/29/2013    Procedure: HOLMIUM LASER APPLICATION;  Surgeon: Garnett Farm, MD;  Location: [facility];  Service: Urology;  Laterality: N/A;  . Esophagogastroduodenoscopy N/A 02/16/2013    Procedure: ESOPHAGOGASTRODUODENOSCOPY (EGD);  Surgeon: Meryl Dare, MD;  Location: [facility];  Service: Endoscopy;  Laterality: N/A;  . Cystoscopy with retrograde pyelogram, ureteroscopy and stent placement Right 05/07/2013    Procedure: CYSTOSCOPY WITH RIGHT RETROGRADE PYELOGRAM, Balloon dilation of right ureter, Laser incision of  right ureter, Nephrostogram;  Surgeon: Garnett Farm, MD;  Location: [facility];  Service: Urology;  Laterality: Right;  . Holmium laser application Right 05/07/2013    Procedure: HOLMIUM LASER APPLICATION;  Surgeon: Garnett Farm, MD;  Location: [facility];  Service: Urology;  Laterality: Right;  . Cystoscopy with retrograde pyelogram, ureteroscopy and stent placement Left 07/15/2014    Procedure: LEFT URETEROSCOPY RETROGRADE PYELOGRAM TRANSECTION OF BLADDER TUMOR WITH BLADDER LESION BIOPSIES;  Surgeon: Ihor Gully, MD;  Location: [facility];  Service: Urology;  Laterality: Left;    Tilda Franco, MS, RD, LDN Pager: [phone] After Hours Pager: [phone]

## 2014-08-19 NOTE — H&P (Signed)
PULMONARY / CRITICAL CARE MEDICINE   Name: [name] MRN: [id number] DOB: [birth year]    ADMISSION DATE:  08/21/2014  REFERRING MD :  Jeani Hawking EDP  CHIEF COMPLAINT:  Sepsis   INITIAL PRESENTATION: 81yo male with extensive PMH including Afib on coumadin, CKD III, chronic sCHF (EF 35%), PAD presented 4/24 to [facility] ED with c/o RLE wound with concern for ischemia.  Also found to have acute on CKD, elevated lactate and borderline BP and was tx to [facility] to PCCM with ?sepsis.   Of note pt has been clear in the past that he would never want dialysis.   STUDIES:  CXR 4/24>> unchanged persistent/chronic R pleural effusion.   SIGNIFICANT EVENTS:   HISTORY OF PRESENT ILLNESS:  81yo male with extensive PMH including Afib on coumadin, CKD III, chronic sCHF (EF 35%), PAD presented 4/24 to [facility] ED with c/o RLE wound with concern for ischemia.  Also found to have acute on CKD, elevated lactate and borderline BP and was tx to [facility] to PCCM with ?sepsis.  Has been falling more at home.  Saw his PCP after a fall last week with ongoing SOB and pleural effusion.  Currently feeling a little better.  Biggest complaint is BLE pain.  Denies chest pain, SOB, bleeding, lightheadedness.      PAST MEDICAL HISTORY :   has a past medical history of Hyperlipidemia; White coat hypertension; Cardiomyopathy; Chronic atrial fibrillation; Vitamin B12 deficiency; Chronic anticoagulation; Arthritis; Sensory neuropathy; Chronic kidney disease, stage III (moderate); Mild renal insufficiency; Hydronephrosis, bilateral; Ureteral calculi; History of CHF (congestive heart failure); History of kidney stones; Bilateral renal cysts; Aneurysm of infrarenal abdominal aorta (3.9 x 3.6 no change 8/05 no change in 2010;  4.7 in 2013); Chronic venous insufficiency; PAD (peripheral artery disease); Edema of both legs; History of gout; Glaucoma of both eyes; Wears hearing aid; CHF (congestive heart failure); Shortness of  breath dyspnea; and Arrhythmia.  has past surgical history that includes Repair thoracic aorta (01/1995); Cataract extraction w/ intraocular lens  implant, bilateral (2012); ENDOVASCULAR RIGHT HYPOGASTRIC ARTERY ANEURYSM REPAIR WITH GRAFT (08-12-2003  DR DICKSON); Laparoscopic cholecystectomy (JAN 2005); Percutaneous nephrostolithotomy (1970's); Abdominal aortagram (07-16-2003  DR Dietrich Pates); Cardiovascular stress test (04-25-2003); transthoracic echocardiogram (02-07-2008  DR ROTHBART); Cystoscopy w/ ureteral stent placement (Bilateral, 10/24/2012); Cystoscopy with ureteroscopy (Left, 11/27/2012); Holmium laser application (Left, 11/27/2012); Cystoscopy with ureteroscopy and stent placement (N/A, 12/29/2012); Holmium laser application (Right, 12/29/2012); Cystoscopy with retrograde pyelogram, ureteroscopy and stent placement (Right, 01/29/2013); Holmium laser application (N/A, 01/29/2013); Esophagogastroduodenoscopy (N/A, 02/16/2013); Cystoscopy with retrograde pyelogram, ureteroscopy and stent placement (Right, 05/07/2013); Holmium laser application (Right, 05/07/2013); and Cystoscopy with retrograde pyelogram, ureteroscopy and stent placement (Left, 07/15/2014). Prior to Admission medications   Medication Sig Start Date End Date Taking? Authorizing Provider  furosemide (LASIX) 40 MG tablet Take 1.5 tablets (60 mg total) by mouth daily. Patient taking differently: Take 60 mg by mouth 2 (two) times daily.  08/05/14  Yes Jodelle Gross, NP  HYDROcodone-acetaminophen (NORCO/VICODIN) 5-325 MG per tablet Take one half to one tablet every 4 hours prn severe pain. Caution drowsiness. 08/08/14  Yes Babs Sciara, MD  simvastatin (ZOCOR) 20 MG tablet Take 20 mg by mouth daily.   Yes Historical Provider, MD  traZODone (DESYREL) 50 MG tablet Take 0.5 tablets (25 mg total) by mouth at bedtime. 07/25/14  Yes Merlyn Albert, MD  vitamin B-12 (CYANOCOBALAMIN) 1000 MCG tablet Take 1,000 mcg by mouth daily.   Yes Historical  Provider,  MD  warfarin (COUMADIN) 5 MG tablet Take 2.5-5 mg by mouth See admin instructions. 1/2 tablet daily except none on Wednesdays   Yes Historical Provider, MD  acetaminophen (TYLENOL) 500 MG tablet Take 1,000 mg by mouth every 6 (six) hours as needed for mild pain, moderate pain or headache.     Historical Provider, MD  colchicine (COLCRYS) 0.6 MG tablet TAKE ONE TABLET BY MOUTH 2 TIMES A DAY FOR GOUT. Patient taking differently: Take 0.6 mg by mouth daily as needed (Gout).  12/24/13   Merlyn Albert, MD  latanoprost (XALATAN) 0.005 % ophthalmic solution Place 1 drop into both eyes at bedtime.  08/12/12   Historical Provider, MD  metolazone (ZAROXOLYN) 5 MG tablet Take 1 tablet 5 mg for 2 days and then STOP Patient not taking: Reported on 08/13/2014 08/05/14   Jodelle Gross, NP  metoprolol (LOPRESSOR) 100 MG tablet Take 1 tablet (100 mg total) by mouth 2 (two) times daily. 07/19/14   Jodelle Gross, NP   Allergies  Allergen Reactions  . Sulfa Antibiotics Rash    Nausea     FAMILY HISTORY:  indicated that his mother is deceased. He indicated that his father is deceased. He indicated that his sister is deceased. He indicated that both of his brothers are alive.  SOCIAL HISTORY:  reports that he has never smoked. He has never used smokeless tobacco. He reports that he does not drink alcohol or use illicit drugs.  REVIEW OF SYSTEMS:  As per HPI - All other systems reviewed and were neg.     SUBJECTIVE:   VITAL SIGNS: Temp:  [96.8 F (36 C)-97.5 F (36.4 C)] 97.2 F (36.2 C) (04/25 0013) Pulse Rate:  [35-115] 42 (04/25 0223) Resp:  [14-26] 18 (04/25 0223) BP: (75-166)/(38-75) 75/38 mmHg (04/25 0223) SpO2:  [82 %-100 %] 100 % (04/25 0223) HEMODYNAMICS:   VENTILATOR SETTINGS:   INTAKE / OUTPUT: No intake or output data in the 24 hours ending 08/19/14 0242  PHYSICAL EXAMINATION: General:  Pleasant elderly male, NAD  Neuro:  Awake, alert, appropriate, MAE  HEENT:  Mm dry, no  JVD  Cardiovascular:  s1s2 irreg  Lungs:  resps even, non labored on [place], slightly diminished R base, otherwise cta  Abdomen:  Round, soft, +bs, non tender  Musculoskeletal:  BLE 2+ pitting edema, erythema, R>L cellulitis, R foot cool with POS pulses   LABS:  CBC  Recent Labs Lab 08/07/2014 2058  WBC 11.5*  HGB 11.3*  HCT 34.6*  PLT 107*   Coag's  Recent Labs Lab 08/04/2014 2058  INR >10.00*   BMET  Recent Labs Lab 08/14/14 0706 07/29/2014 2058  NA 136 134*  K 4.7 4.5  CL 101 104  CO2 19 16*  BUN 124* 141*  CREATININE 4.41* 4.44*  GLUCOSE 76 102*   Electrolytes  Recent Labs Lab 08/14/14 0706 08/16/2014 2058  CALCIUM 7.9* 8.0*   Sepsis Markers  Recent Labs Lab 08/12/2014 2123  LATICACIDVEN 2.27*   ABG No results for input(s): PHART, PCO2ART, PO2ART in the last 168 hours. Liver Enzymes No results for input(s): AST, ALT, ALKPHOS, BILITOT, ALBUMIN in the last 168 hours. Cardiac Enzymes No results for input(s): TROPONINI, PROBNP in the last 168 hours. Glucose No results for input(s): GLUCAP in the last 168 hours.  Imaging Dg Chest 2 View  08/19/2014   CLINICAL DATA:  81 year old male with a history of leg swelling  EXAM: CHEST - 2 VIEW  COMPARISON:  08/02/2014  FINDINGS: Cardiomediastinal silhouette unchanged. Atherosclerotic calcifications of the aortic arch.  Surgical changes of median sternotomy.  Dense opacity at the right base persists, unchanged from prior. No left pleural effusion identified.  No pneumothorax.  No displaced fracture.  Unremarkable appearance of the upper abdomen.  IMPRESSION: Persisting right pleural effusion with associated atelectasis. Infection difficult to exclude.  Atherosclerosis with evidence of median sternotomy.  Signed,  Yvone Neu. Loreta Ave, DO  [facility]  [facility]   Electronically Signed   By: Gilmer Mor D.O.   On: 01-Sep-2014 21:39     ASSESSMENT / PLAN:  R pleural effusion -  chronic.  Likely r/t CHF.  PLAN -  F/u CXR  Supplemental O2 as needed    SIRS/sepsis - likely r/t cellulitis sCHF  AFib  P:  F/u lactate  Monitor BP Hold home lasix for now with hypotension  F/u BNP Hold home lopressor - may need additional rx for rate control    Acute on CKD III - baseline Scr ~2.6 P:   Pt would NOT want dialysis  F/u chem    Coagulopathy - INR >10 Thrombocytopenia  P:  Vit K given  FFP  F/u cbcb F/u coags  Hold coumadin   RLE cellulitis/ wound UTI  P:   BCx2 4/24>>> UC 4/24>>>  Vancomycin 4/25>>> Zosyn 4/25>>>   Discussed at length with patient at bedside.  He is very informed about his medical care and is adamant that he would not want aggressive interventions including intubation, pressors, dialysis.  He would like to continue reasonable medical treatment including abx, fluids, coagulopathy reversal but states "when God is ready to take me, I want to go".  We agreed to continue care as prescribed now, make full DNR and transition to comfort if he were to worsen.    Dirk Dress, NP 08/19/2014  2:42 AM Pager: [phone] or [phone]    Agree, see my note from today  Heber Sandston, MD [facility] Pager: [phone] Cell: [phone] If no response, call [phone]

## 2014-08-20 ENCOUNTER — Ambulatory Visit: Payer: Medicare Other | Admitting: Nurse Practitioner

## 2014-08-20 ENCOUNTER — Inpatient Hospital Stay (HOSPITAL_COMMUNITY): Payer: Medicare Other

## 2014-08-20 ENCOUNTER — Encounter: Payer: Self-pay | Admitting: *Deleted

## 2014-08-20 DIAGNOSIS — E440 Moderate protein-calorie malnutrition: Secondary | ICD-10-CM | POA: Diagnosis present

## 2014-08-20 DIAGNOSIS — I9589 Other hypotension: Secondary | ICD-10-CM

## 2014-08-20 DIAGNOSIS — I481 Persistent atrial fibrillation: Secondary | ICD-10-CM

## 2014-08-20 LAB — COMPREHENSIVE METABOLIC PANEL
ALT: 14 U/L (ref 0–53)
AST: 16 U/L (ref 0–37)
Albumin: 2.7 g/dL — ABNORMAL LOW (ref 3.5–5.2)
Alkaline Phosphatase: 71 U/L (ref 39–117)
Anion gap: 10 (ref 5–15)
BILIRUBIN TOTAL: 1.4 mg/dL — AB (ref 0.3–1.2)
BUN: 125 mg/dL — AB (ref 6–23)
CO2: 18 mmol/L — AB (ref 19–32)
Calcium: 7.7 mg/dL — ABNORMAL LOW (ref 8.4–10.5)
Chloride: 105 mmol/L (ref 96–112)
Creatinine, Ser: 4.02 mg/dL — ABNORMAL HIGH (ref 0.50–1.35)
GFR calc Af Amer: 14 mL/min — ABNORMAL LOW (ref >90)
GFR calc non Af Amer: 12 mL/min — ABNORMAL LOW (ref >90)
GLUCOSE: 105 mg/dL — AB (ref 70–99)
Potassium: 4.1 mmol/L (ref 3.5–5.1)
Sodium: 133 mmol/L — ABNORMAL LOW (ref 135–145)
Total Protein: 6 g/dL (ref 6.0–8.3)

## 2014-08-20 LAB — CBC
HCT: 32.3 % — ABNORMAL LOW (ref 39.0–52.0)
Hemoglobin: 10.6 g/dL — ABNORMAL LOW (ref 13.0–17.0)
MCH: 31 pg (ref 26.0–34.0)
MCHC: 32.8 g/dL (ref 30.0–36.0)
MCV: 94.4 fL (ref 78.0–100.0)
Platelets: 127 10*3/uL — ABNORMAL LOW (ref 150–400)
RBC: 3.42 MIL/uL — AB (ref 4.22–5.81)
RDW: 19.5 % — ABNORMAL HIGH (ref 11.5–15.5)
WBC: 15.4 10*3/uL — AB (ref 4.0–10.5)

## 2014-08-20 LAB — CORTISOL: Cortisol, Plasma: 40.1 ug/dL

## 2014-08-20 LAB — BRAIN NATRIURETIC PEPTIDE: B Natriuretic Peptide: 933 pg/mL — ABNORMAL HIGH (ref 0.0–100.0)

## 2014-08-20 LAB — VANCOMYCIN, RANDOM: Vancomycin Rm: 11.9 ug/mL

## 2014-08-20 LAB — PROTIME-INR
INR: 2 — AB (ref 0.00–1.49)
PROTHROMBIN TIME: 22.8 s — AB (ref 11.6–15.2)

## 2014-08-20 LAB — GLUCOSE, CAPILLARY: Glucose-Capillary: 143 mg/dL — ABNORMAL HIGH (ref 70–99)

## 2014-08-20 MED ORDER — WARFARIN - PHARMACIST DOSING INPATIENT
Freq: Every day | Status: DC
  Administered 2014-08-26: 18:00:00

## 2014-08-20 MED ORDER — VANCOMYCIN HCL IN DEXTROSE 1-5 GM/200ML-% IV SOLN: 1000.0000 mg | Freq: Once | INTRAVENOUS | Status: DC

## 2014-08-20 MED ORDER — PHENYLEPHRINE HCL 10 MG/ML IJ SOLN
30.0000 ug/min | INTRAMUSCULAR | Status: DC
  Administered 2014-08-20: 90 ug/min via INTRAVENOUS
  Administered 2014-08-20: 100 ug/min via INTRAVENOUS
  Filled 2014-08-20 (×2): qty 4

## 2014-08-20 MED ORDER — AMIODARONE HCL 200 MG PO TABS
200.0000 mg | ORAL_TABLET | Freq: Two times a day (BID) | ORAL | Status: DC
  Administered 2014-08-20 – 2014-08-22 (×5): 200 mg via ORAL
  Filled 2014-08-20 (×5): qty 1

## 2014-08-20 MED ORDER — VANCOMYCIN HCL IN DEXTROSE 1-5 GM/200ML-% IV SOLN
1000.0000 mg | INTRAVENOUS | Status: AC
  Administered 2014-08-21: 1000 mg via INTRAVENOUS
  Filled 2014-08-20: qty 200

## 2014-08-20 MED ORDER — WARFARIN SODIUM 5 MG PO TABS
2.5000 mg | ORAL_TABLET | Freq: Once | ORAL | Status: AC
  Administered 2014-08-20: 2.5 mg via ORAL
  Filled 2014-08-20: qty 0.5

## 2014-08-20 NOTE — Progress Notes (Signed)
ANTICOAGULATION CONSULT NOTE - Follow up  Pharmacy Consult for warfarin Indication: atrial fibrillation  Allergies  Allergen Reactions  . Sulfa Antibiotics Rash    Nausea    Patient Measurements: Height: 5\' 10"  (177.8 cm) Weight: 190 lb 7.6 oz (86.4 kg) IBW/kg (Calculated) : 73  Vital Signs: Temp: 97.9 F (36.6 C) (04/26 0836) Temp Source: Oral (04/26 0836) BP: 85/44 mmHg (04/26 0945) Pulse Rate: 34 (04/26 0945)  Labs:  Recent Labs  07/27/2014 2058 08/19/14 0745 08/20/14 0406  HGB 11.3* 11.1* 10.6*  HCT 34.6* 33.7* 32.3*  PLT 107* 132* 127*  LABPROT >90.0* 32.3* 22.8*  INR >10.00* 3.12* 2.00*  CREATININE 4.44* 4.11* 4.02*  CKTOTAL  --  87  --     Estimated Creatinine Clearance: 13.6 mL/min (by C-G formula based on Cr of 4.02).   Medications:  Scheduled:  . latanoprost  1 drop Both Eyes QHS  . piperacillin-tazobactam (ZOSYN)  IV  2.25 g Intravenous Q8H  . simvastatin  20 mg Oral Daily  . sodium chloride  600 mL Intravenous Once  . [START ON 08/21/2014] vancomycin  1,000 mg Intravenous Once  . vitamin B-12  1,000 mcg Oral Daily    Assessment: 81yo M presented to [facility] w/ leg swelling, suspected cellulitis, elev SCr, and supratherapeutic INR. Pharmacy was consulted to dose warfarin. Recent course of Vantin could have contributed to supratherapeutic INR. Denies significant changes in dietary intake. I confirmed his most recent warfarin regimen with him: 2.5mg  daily except nothing on Wednesdays.  Significant events: 4/25: INR improved to 3.12 after vit.K 10mg .   Today, 08/20/2014:  INR at low end of therapeutic range now.   H/H and platelets are low but stable. No bleeding reported/documented.  Tolerating some CL diet.  Could be more sensitive d/t broad-spectrum abx but also less sensitive for the next few days d/t Vit.K 10mg .    Goal of Therapy:  INR 2-3 Monitor platelets by anticoagulation protocol: Yes   Plan:  Give warfarin 2.5mg  today.  Check  PT/INR daily.  Charolotte Eke, PharmD, pager [phone]. 08/20/2014,10:10 AM.

## 2014-08-20 NOTE — Evaluation (Signed)
Physical Therapy Evaluation Patient Details Name: [name] MRN: [id number] DOB: March 14, 1926 Today's Date: 08/20/2014   History of Present Illness  81 yo male admitted with sepsis. hx of recent fall with R ankle sprain, afib, chf, pad.   Clinical Impression  On eval, pt required Mod assist +2 for mobility-performed stand pivot from recliner to bed with RW. Limited by pain, fatigue. No family present to discuss d/c plans. At this time, recommend ST [facility] at [facility].     Follow Up Recommendations SNF;Supervision/Assistance - 24 hour    Equipment Recommendations  Rolling walker with 5" wheels    Recommendations for Other Services       Precautions / Restrictions Precautions Precautions: Fall Restrictions Weight Bearing Restrictions: No      Mobility  Bed Mobility Overal bed mobility: Needs Assistance Bed Mobility: Sit to Supine       Sit to supine: Mod assist   General bed mobility comments: Assist for bil LEs onto bed. Increased time.   Transfers Overall transfer level: Needs assistance Equipment used: Rolling walker (2 wheeled) Transfers: Sit to/from [employer] Sit to Stand: Mod assist;+2 physical assistance;+2 safety/equipment Stand pivot transfers: Mod assist       General transfer comment: Assist to rise, stabilize, control descent. VCs safety, technique, hand/feet placement. stand pivot from recliner to bed with RW.   Ambulation/Gait             General Gait Details: Nt-pt unable at this time  Stairs            Wheelchair Mobility    Modified Rankin (Stroke Patients Only)       Balance                                             Pertinent Vitals/Pain Pain Assessment: Faces Faces Pain Scale: Hurts whole lot Pain Location: bil LEs/feet Pain Descriptors / Indicators: Sore Pain Intervention(s): Limited activity within patient's tolerance;Repositioned    Home Living Family/patient expects to be  discharged to:: Unsure                      Prior Function                 Hand Dominance        Extremity/Trunk Assessment   Upper Extremity Assessment: Generalized weakness           Lower Extremity Assessment: Generalized weakness      Cervical / Trunk Assessment: Kyphotic  Communication   Communication: No difficulties  Cognition Arousal/Alertness: Awake/alert Behavior During Therapy: WFL for tasks assessed/performed Overall Cognitive Status: Within Functional Limits for tasks assessed                      General Comments      Exercises        Assessment/Plan    PT Assessment Patient needs continued PT services  PT Diagnosis Difficulty walking;Abnormality of gait;Generalized weakness;Acute pain   PT Problem List Decreased strength;Decreased activity tolerance;Decreased balance;Decreased mobility;Decreased knowledge of use of DME;Pain  PT Treatment Interventions DME instruction;Gait training;Functional mobility training;Therapeutic activities;Therapeutic exercise;Patient/family education;Balance training   PT Goals (Current goals can be found in the Care Plan section) Acute Rehab PT Goals Patient Stated Goal: less pain.  PT Goal Formulation: With patient Time For Goal Achievement: 09/03/14 Potential to Achieve  Goals: Fair    Frequency Min 3X/week   Barriers to discharge        Co-evaluation               End of Session   Activity Tolerance: Patient limited by fatigue;Patient limited by pain Patient left: in bed;with call bell/phone within reach           Time: 1122-1133 PT Time Calculation (min) (ACUTE ONLY): 11 min   Charges:   PT Evaluation $Initial PT Evaluation Tier I: 1 Procedure     PT G Codes:        Rebeca Alert, MPT Pager: [phone]

## 2014-08-20 NOTE — Patient Outreach (Signed)
[facility]) Care Management  [facility] Care Management received a referral on Mr. Wearing on 07/22/14 from Dr. Fletcher Anon office. I attempted to make contact with Mr. Beltrame on separate occasions beginning on 07/24/14 but was unable to reach him and unable to leave a message requesting a return call. It is noted that Mr. Gersh was admitted to the hospital on 08-24-2014 with sepsis. PT eval recommending SNF placement at discharge. I have notified [facility] Care Management liaisons of the original referral, outreach attempts, and current admission and will follow Mr. Masek' progress closely. I will plan to engage Mr. Resendis again at discharge whether he agrees to SNF or other discharge plan.     Marja Kays MHA,BSN,RN,CCM [facility] Care Management  [phone]

## 2014-08-20 NOTE — Progress Notes (Signed)
  Echocardiogram 2D Echocardiogram has been performed.  Cathie Beams 08/20/2014, 1:58 PM

## 2014-08-20 NOTE — Progress Notes (Signed)
ANTIBIOTIC CONSULT NOTE - Follow up  Pharmacy Consult for Vancomycin and Zosyn Indication: cellulitis  Allergies  Allergen Reactions  . Sulfa Antibiotics Rash    Nausea     Patient Measurements: Height:  (177.8 cm) Weight: 190 lb 7.6 oz (86.4 kg) IBW/kg (Calculated) : 73  Vital Signs: Temp: 97.7 F (36.5 C) (04/26 0400) Temp Source: Oral (04/26 0400) BP: 95/60 mmHg (04/26 0600) Pulse Rate: 102 (04/26 0600) Intake/Output from previous day: 04/25 0701 - 04/26 0700 In: 3041.1 [P.O.:360; I.V.:2531.1; IV Piggyback:150] Out: 1075 [Urine:1075] Intake/Output from this shift:    Labs:  Recent Labs  08/08/2014 2058 08/19/14 0745 08/20/14 0406  WBC 11.5* 16.6* 15.4*  HGB 11.3* 11.1* 10.6*  PLT 107* 132* 127*  CREATININE 4.44* 4.11* 4.02*   Estimated Creatinine Clearance: 13.6 mL/min (by C-G formula based on Cr of 4.02).  Recent Labs  08/20/14 0406  VANCORANDOM 11.9     Microbiology: Recent Results (from the past 720 hour(s))  Blood Culture (routine x 2)     Status: None (Preliminary result)   Collection Time: 07/27/2014  8:58 PM  Result Value Ref Range Status   Specimen Description BLOOD LEFT HAND  Final   Special Requests BOTTLES DRAWN AEROBIC ONLY 7CC  Final   Culture NO GROWTH 1 DAY  Final   Report Status PENDING  Incomplete  Blood Culture (routine x 2)     Status: None (Preliminary result)   Collection Time: 08/11/2014  9:05 PM  Result Value Ref Range Status   Specimen Description BLOOD LEFT HAND  Final   Special Requests BOTTLES DRAWN AEROBIC ONLY 7CC  Final   Culture NO GROWTH 1 DAY  Final   Report Status PENDING  Incomplete  MRSA PCR Screening     Status: None   Collection Time: 08/19/14  3:11 AM  Result Value Ref Range Status   MRSA by PCR NEGATIVE NEGATIVE Final    Comment:        The GeneXpert MRSA Assay (FDA approved for NASAL specimens only), is one component of a comprehensive MRSA colonization surveillance program. It is not intended to  diagnose MRSA infection nor to guide or monitor treatment for MRSA infections.     Anti-infectives    Start     Dose/Rate Route Frequency Ordered Stop   08/19/14 0600  piperacillin-tazobactam (ZOSYN) IVPB 2.25 g     2.25 g 100 mL/hr over 30 Minutes Intravenous Every 8 hours 08/19/14 0216     08/08/2014 2330  vancomycin (VANCOCIN) 500 mg in sodium chloride 0.9 % 100 mL IVPB    Comments:  * EXTRA DOSE - Give in addition to previous dose to make  total dose **   500 mg 100 mL/hr over 60 Minutes Intravenous  Once 08/08/2014 2235 08/19/14 0129   08/17/2014 2115  piperacillin-tazobactam (ZOSYN) IVPB 3.375 g     3.375 g 100 mL/hr over 30 Minutes Intravenous  Once 08/17/2014 2113 07/31/2014 2230   08/03/2014 2115  vancomycin (VANCOCIN) IVPB 1000 mg/200 mL premix     1,000 mg 200 mL/hr over 60 Minutes Intravenous  Once 07/31/2014 2113 08/22/2014 2304     Assessment: 81yo male admitted with cellulitis.  SCr elevated. Asked to initiate Vancomycin and Zosyn. Initial doses given in ED.  Random vanc level = 11.9 drawn ~28 hours after  given. Better clearance than expected.  Afebrile.  WBCs remain elevated.  SCr slightly better, CrCl ~14CG  Goal of Therapy:  Vancomycin trough level 10-15 mcg/ml  Appropriate  antibiotic dosing for renal function; eradication of infection  Plan:  Give Vancomycin 1g IV x 1 this am.  Check Vancomycin level in 24hrs. Cont Zosyn 2.25g IV q8h. Follow up renal fxn, culture results, and clinical course.  Charolotte Eke, PharmD, pager [phone]. 08/20/2014,7:15 AM.

## 2014-08-20 NOTE — Progress Notes (Signed)
PULMONARY / CRITICAL CARE MEDICINE   Name: [name] MRN: [id number] DOB: 1925/06/21    ADMISSION DATE:  07/30/2014  REFERRING MD :  Jeani Hawking EDP  CHIEF COMPLAINT:  Sepsis   INITIAL PRESENTATION: 81 y/o male with extensive PMH including Afib on coumadin, CKD III, chronic sCHF (EF 35%), PAD presented 4/24 to [facility] ED with c/o RLE wound with concern for ischemia.  Also found to have acute on CKD, elevated lactate and borderline BP and was tx to [facility] to PCCM with ?sepsis.    Of note pt has been clear in the past that he would never want dialysis.   STUDIES:  CXR 4/24 >> unchanged persistent/chronic R pleural effusion. Hip XR 4/25 >> neg Tib/Fib XR 4/25 >> neg for acute fx, mild knee/ankle joint osteo, extensive peripheral vascular calcification, focal soft tissue swelling  ECHO 4/26 >>    SIGNIFICANT EVENTS: 4/24  Admit with hypotension, BLE pain, acute on chronic CKD, elevated lactate, hypotension  4/25  Remains on 10 mcg levo, BP in 70's with excellent mental status 4/26  Neosynephrine gtt   SUBJECTIVE: No acute events.  Reports he is tired of sitting in bed.  Remains on neo - RN reports overnight staff weaned for systolic of 90 instead of MAP of 55.  Weaning down currently.   VITAL SIGNS: Temp:  [97.5 F (36.4 C)-98.7 F (37.1 C)] 97.7 F (36.5 C) (04/26 0400) Pulse Rate:  [28-154] 102 (04/26 0600) Resp:  [14-28] 15 (04/26 0600) BP: (62-99)/(31-70) 95/60 mmHg (04/26 0600) SpO2:  [95 %-100 %] 98 % (04/26 0600)   HEMODYNAMICS:     VENTILATOR SETTINGS:     INTAKE / OUTPUT:  Intake/Output Summary (Last 24 hours) at 08/20/14 0756 Last data filed at 08/20/14 0600  Gross per 24 hour  Intake 3041.08 ml  Output   1075 ml  Net 1966.08 ml    PHYSICAL EXAMINATION: General:  Pleasant elderly male in NAD  Neuro:  Awake, alert, appropriate, MAE  HEENT:  Mm dry, no JVD  Cardiovascular:  s1s2 irregularly irregular, afib on monitor  Lungs:  resps even/non  labored on [place], diminished in bases, no wheezing, otherwise clear Abdomen:  Round, soft, +bs, non tender  Musculoskeletal:  BLE 2+ pitting edema, erythema, R>L cellulitis, R foot cool with POS pulses, ecchymosis noted (subj hx of fall with sprain)    LABS:  CBC  Recent Labs Lab 08/14/2014 2058 08/19/14 0745 08/20/14 0406  WBC 11.5* 16.6* 15.4*  HGB 11.3* 11.1* 10.6*  HCT 34.6* 33.7* 32.3*  PLT 107* 132* 127*   Coag's  Recent Labs Lab 08/13/2014 2058 08/19/14 0745 08/20/14 0406  INR >10.00* 3.12* 2.00*   BMET  Recent Labs Lab 08/22/2014 2058 08/19/14 0745 08/20/14 0406  NA 134* 132* 133*  K 4.5 4.3 4.1  CL 104 106 105  CO2 16* 16* 18*  BUN 141* 127* 125*  CREATININE 4.44* 4.11* 4.02*  GLUCOSE 102* 107* 105*   Electrolytes  Recent Labs Lab 08/10/2014 2058 08/19/14 0745 08/20/14 0406  CALCIUM 8.0* 7.5* 7.7*   Sepsis Markers  Recent Labs Lab 08/12/2014 2123 08/19/14 1155  LATICACIDVEN 2.27* 2.0   ABG No results for input(s): PHART, PCO2ART, PO2ART in the last 168 hours.   Liver Enzymes  Recent Labs Lab 08/20/14 0406  AST 16  ALT 14  ALKPHOS 71  BILITOT 1.4*  ALBUMIN 2.7*   Cardiac Enzymes No results for input(s): TROPONINI, PROBNP in the last 168 hours.  Glucose No results for input(s): GLUCAP in the last 168 hours.  Imaging Dg Tibia/fibula Right  08/19/2014   CLINICAL DATA:  Right leg pain.  No known injury.  EXAM: RIGHT TIBIA AND FIBULA - 2 VIEW  COMPARISON:  None.  FINDINGS: Focal soft tissue swelling is seen along the anterior aspect of the tibial diaphysis. No evidence of fracture or other focal bone lesions.  Mild degenerative spurring is seen involving the patella and ankle joint. Extensive peripheral vascular calcification is noted.  IMPRESSION: Focal soft tissue swelling along the anterior aspect of the tibial diaphysis. No evidence of fracture.  Mild knee and ankle joint osteoarthritis.  Extensive peripheral vascular calcification.    Electronically Signed   By: Myles Rosenthal M.D.   On: 08/19/2014 14:39   Dg Hip Unilat With Pelvis 1v Right  08/19/2014   CLINICAL DATA:  RIGHT hip pain.  EXAM: RIGHT HIP (WITH PELVIS) 1 VIEW  COMPARISON:  None.  FINDINGS: There is no evidence of hip fracture or dislocation. There is no evidence of arthropathy or other focal bone abnormality. Marked vascular calcification. Previous iliac stent placement with endovascular coiling.  IMPRESSION: No acute abnormality is evident.   Electronically Signed   By: Davonna Belling M.D.   On: 08/19/2014 14:39     ASSESSMENT / PLAN:  Bilateral pleural effusion - chronic.  Likely r/t CHF. L>R Pulmonary Edema - in setting of CHF P: Trend CXR  Supplemental O2 as needed for sats > 90% Pulmonary hygiene  Severe Sepsis - likely r/t UTI, +/-cellulitis sCHF  AFib - on chronic coumadin PVD P:  Changed to neo 4/25, titrate for MAP > 55 Monitor BP, ? If accurate readings with cuff and hx of PVD Hold home lasix for now with hypotension  Trend BNP Hold home lopressor - may need additional rx for rate control  No CPR Assess ECHO with hypotension Add cortisol to am labs  Acute on CKD III - baseline Scr ~2.6 Elevated Lactic Acid P:   Pt would NOT want dialysis  Trend BMP Replace electrolytes as indicated  Decrease NS to 40 ml/hr with CXR (effusions/edema)  Coagulopathy - INR >10, recent abx ~ 5 weeks pta Thrombocytopenia - S/p FFP, Vit K  P:  Trend CBC, Coags Restart coumadin per pharmacy 4/25  BLE Cellulitis RLE Wound - injured with leg bag UTI  P:   BCx2 4/24 >> UC 4/24 >> UA 4/24 >> positive for UTI (wbc tntc, mod leuks, many bacteria)  Vancomycin 4/25>>> Zosyn 4/25>>>  WOC for LE wounds  Pain - BLE  Deconditioning   P: PT consult for deconditioning, hx of falls  Activity as tolerated    GOALS of CARE:  Discussion had overnight 4/24 with patient regarding GOC.  He initially wanted to be a DNR.  His sister became involved in his care  and code status was reversed.  Clarified with patient and niece in the room 4/25.  He wishes for medical care that would continue to allow him to remain independent.  He does not want dialysis under any circumstance.  Discussed CPR with the patient and he would not want CPR in the event of arrest.  Further discussed mechanical ventilation with patient and he would be open to MV support pending the reason he would require it.  Ex: if he had a catastrophic stroke, he would not want it and would desire natural death.  However, if a reversible process he would be in favor of support if needed.  Canary Brim, NP-C [facility] Pgr: [phone] or [phone]  08/20/2014  7:56 AM  Attending:  I have seen and examined the patient with nurse practitioner/resident and agree with the note above.   Mr. Yerby is feeling better today, has been out of bed, however he remains on vasopressors  On exam, diminshed breath sounds R base, otherwise few crackles, legs with massive pitting edema, acyanotic, warm  CXR images reveiwed> worsening pleural effusion on R  Old records reviewed including PCP notes and echocardiogram from last month showing biventricular failure, the patient was adamant that he did not want dialysis on last visit, PCP brought up idea of considering a "rest home" for all his medical issues and contacting social services  Shock> unchanged unfortunately and still vasopressor dependent, initial scenario seemed consistent with septic shock, but now persistent with clearing lactic acid, is this worsening cardiogenic shock? Repeat echo, cut back on fluids, continue neo but wean as able  Afib with RVR> on metoprolol at home, holding due to shock, if persistent HR > start amiodarone oral  Leg pain> CXR without fracture, suspect due to edema as likelihood of clot low with INR > 10, no cyanosis to suggest ischemia; check CK to rule out myositis  My cc time 37 minutes  Heber Chokio, MD [facility] Pager: [phone] Cell: [phone] If no response, call [phone]

## 2014-08-21 ENCOUNTER — Inpatient Hospital Stay (HOSPITAL_COMMUNITY): Payer: Medicare Other

## 2014-08-21 ENCOUNTER — Encounter: Payer: Self-pay | Admitting: *Deleted

## 2014-08-21 DIAGNOSIS — R6520 Severe sepsis without septic shock: Secondary | ICD-10-CM

## 2014-08-21 DIAGNOSIS — E440 Moderate protein-calorie malnutrition: Secondary | ICD-10-CM

## 2014-08-21 DIAGNOSIS — I5022 Chronic systolic (congestive) heart failure: Secondary | ICD-10-CM

## 2014-08-21 LAB — CBC
HCT: 29.7 % — ABNORMAL LOW (ref 39.0–52.0)
Hemoglobin: 9.9 g/dL — ABNORMAL LOW (ref 13.0–17.0)
MCH: 31.6 pg (ref 26.0–34.0)
MCHC: 33.3 g/dL (ref 30.0–36.0)
MCV: 94.9 fL (ref 78.0–100.0)
Platelets: 87 10*3/uL — ABNORMAL LOW (ref 150–400)
RBC: 3.13 MIL/uL — ABNORMAL LOW (ref 4.22–5.81)
RDW: 19.8 % — ABNORMAL HIGH (ref 11.5–15.5)
WBC: 8.7 10*3/uL (ref 4.0–10.5)

## 2014-08-21 LAB — BASIC METABOLIC PANEL
Anion gap: 10 (ref 5–15)
BUN: 110 mg/dL — ABNORMAL HIGH (ref 6–23)
CALCIUM: 7.8 mg/dL — AB (ref 8.4–10.5)
CHLORIDE: 106 mmol/L (ref 96–112)
CO2: 18 mmol/L — AB (ref 19–32)
Creatinine, Ser: 3.91 mg/dL — ABNORMAL HIGH (ref 0.50–1.35)
GFR calc Af Amer: 15 mL/min — ABNORMAL LOW (ref >90)
GFR calc non Af Amer: 13 mL/min — ABNORMAL LOW (ref >90)
Glucose, Bld: 113 mg/dL — ABNORMAL HIGH (ref 70–99)
POTASSIUM: 4 mmol/L (ref 3.5–5.1)
Sodium: 134 mmol/L — ABNORMAL LOW (ref 135–145)

## 2014-08-21 LAB — PROTIME-INR
INR: 3.11 — ABNORMAL HIGH (ref 0.00–1.49)
Prothrombin Time: 32.3 seconds — ABNORMAL HIGH (ref 11.6–15.2)

## 2014-08-21 LAB — URINE CULTURE: Colony Count: >=100000

## 2014-08-21 LAB — BRAIN NATRIURETIC PEPTIDE: B Natriuretic Peptide: 430.2 pg/mL — ABNORMAL HIGH (ref 0.0–100.0)

## 2014-08-21 MED ORDER — DEXTROSE 5 % IV SOLN
1.0000 g | INTRAVENOUS | Status: DC
  Administered 2014-08-21 – 2014-08-26 (×6): 1 g via INTRAVENOUS
  Filled 2014-08-21 (×7): qty 1

## 2014-08-21 MED ORDER — SENNOSIDES-DOCUSATE SODIUM 8.6-50 MG PO TABS
1.0000 | ORAL_TABLET | Freq: Every day | ORAL | Status: DC | PRN
  Administered 2014-08-21 – 2014-08-27 (×2): 1 via ORAL
  Filled 2014-08-21 (×2): qty 1

## 2014-08-21 NOTE — Progress Notes (Signed)
ANTICOAGULATION CONSULT NOTE - Follow up  Pharmacy Consult for warfarin Indication: atrial fibrillation  Allergies  Allergen Reactions  . Sulfa Antibiotics Rash    Nausea    Patient Measurements: Height: 5\' 10"  (177.8 cm) Weight: 190 lb 7.6 oz (86.4 kg) IBW/kg (Calculated) : 73  Vital Signs: Temp: 97 F (36.1 C) (04/27 0800) Temp Source: Axillary (04/27 0800) BP: 94/54 mmHg (04/27 0800) Pulse Rate: 38 (04/27 0800)  Labs:  Recent Labs  08/19/14 0745 08/20/14 0406 08/21/14 0350  HGB 11.1* 10.6* 9.9*  HCT 33.7* 32.3* 29.7*  PLT 132* 127* 87*  LABPROT 32.3* 22.8* 32.3*  INR 3.12* 2.00* 3.11*  CREATININE 4.11* 4.02* 3.91*  CKTOTAL 87  --   --     Estimated Creatinine Clearance: 14 mL/min (by C-G formula based on Cr of 3.91).   Medications:  Scheduled:  . amiodarone  200 mg Oral BID  . latanoprost  1 drop Both Eyes QHS  . piperacillin-tazobactam (ZOSYN)  IV  2.25 g Intravenous Q8H  . simvastatin  20 mg Oral Daily  . sodium chloride  600 mL Intravenous Once  . vitamin B-12  1,000 mcg Oral Daily  . Warfarin - Pharmacist Dosing Inpatient   Does not apply q1800    Assessment: 81yo M presented to [facility] w/ leg swelling, suspected cellulitis, elev SCr, and supratherapeutic INR. Pharmacy was consulted to dose warfarin. Recent course of Vantin could have contributed to supratherapeutic INR. Denies significant changes in dietary intake. I confirmed his most recent warfarin regimen with him: 2.5mg  daily except nothing on Wednesdays.  Significant events: 4/25: INR improved to 3.12 after vit.K 10mg  and FFP.  4/26: INR dropped to 2. Gave warf 2.5mg . Started amiodarone.  Today, 08/21/2014:  INR jumped back up quickly. Now slightly supratherapeutic.   H/H and platelets have trended down further. No bleeding reported/documented.  Poor intake of CL diet.  Could be more sensitive d/t broad-spectrum abx but also less sensitive for the next few days d/t Vit.K 10mg .    Amiodarone will increase sensitivity to warfarin as it reaches steady-state over the next couple weeks.   Goal of Therapy:  INR 2-3 Monitor platelets by anticoagulation protocol: Yes   Plan:  No warfarin today.  Check PT/INR daily.  Charolotte Eke, PharmD, pager [phone]. 08/21/2014,9:56 AM.

## 2014-08-21 NOTE — Clinical Social Work Note (Signed)
Clinical Social Work Assessment  Patient Details  Name: [name] MRN: [id number] Date of Birth: [birth year]  Date of referral:  08/21/14               Reason for consult:  Facility Placement                Permission sought to share information with:  Family Supports Permission granted to share information::  No  Name::      (pt declined SNF search)  Agency::     Relationship::     Contact Information:     Housing/Transportation Living arrangements for the past 2 months:  Single Family Home Source of Information:  Patient Patient Interpreter Needed:  None Criminal Activity/Legal Involvement Pertinent to Current Situation/Hospitalization:  No - Comment as needed Significant Relationships:  Siblings, Other(Comment) (per chart, pt niece present at bedside earlier in the day) Lives with:  Self Do you feel safe going back to the place where you live?  Yes (pt states that he is unsure of his discharge plan, but wants to return home, declining SNF at this time) Need for family participation in patient care:  Yes (Comment) (If pt continues to decline SNF; may need to explore getting family involved in assisting with decision)  Care giving concerns:  Pt lives alone and recommendation for SNF for short term rehab.    Social Worker assessment / plan:  CSW received referral for New SNF.   CSW met with pt at bedside. Pt reports that he lives alone. Pt would not identify support persons for this CSW, but per chart, pt sister and pt niece involved.   CSW provided support to pt surrounding current hospitalization and pt reports that he is slowing progressing.  CSW encouraged pt to discuss plans for discharge recognizing that pt not yet medically ready for discharge.  Pt states that he does not know what he is going to do and states that it is too soon to talk about discharge. CSW validated pt feelings and discussed that CSW was inquiring with pt as PT had recommended SNF. Pt asked CSW to  further explain about SNF and CSW provided education to pt surrounding SNF. Pt states, "I am not agreeing to that now, I can't walk." CSW explained that rehab at SNF would assist pt in regaining his strength in order to begin ambulating again. Pt discussed that he just wants to be at home and does not want to go anywhere else, but home. CSW inquired about support at home and pt states that his neighbors check on him. CSW inquired with pt if pt agreeable to CSW following up at a later time and pt stated that he was.   CSW provided emotional support to pt as pt discussed that his wife is deceased, he lost his daughter in a car accident, and his son to multiple myeloma.   CSW encouraged pt to continue to participate with PT and CSW will continue discussion with pt surrounding disposition planning.  Pt is not yet agreeable to allow CSW to explore SNF facilities.   CSW to continue to follow to provide support and assist with pt disposition planning.  Employment status:  Retired Forensic scientist:  Medicare PT Recommendations:  [place] / Referral to community resources:  Other (Comment Required) (pt not agreeable at this time to SNF search)  Patient/Family's Response to care:  Pt declining SNF search at this time. Pt overwhelmed with discussion and asked CSW to return  at another time to further discuss.   Patient/Family's Understanding of and Emotional Response to Diagnosis, Current Treatment, and Prognosis:  Pt displays difficult coping surrounding current medical condition and difficult coping with loss of independence at this time. Pt ideally wants to return home and care for himself. No family present at bedside to assess support system.   Emotional Assessment Appearance:  Appears stated age Attitude/Demeanor/Rapport:  Apprehensive Affect (typically observed):  Apprehensive, Irritable Orientation:  Oriented to Self, Oriented to Place, Oriented to  Time Alcohol /  Substance use:  Not Applicable Psych involvement (Current and /or in the community):  No (Comment)  Discharge Needs  Concerns to be addressed:  Patient refuses services, Discharge Planning Concerns, Home Safety Concerns Readmission within the last 30 days:  No Current discharge risk:  Lives alone Barriers to Discharge:  Continued Medical Work up   Ladell Pier, LCSW 08/21/2014, 5:04 PM  [phone]

## 2014-08-21 NOTE — Significant Event (Signed)
Patient has been transferred safely to [address], traveled via wheelchair. VS stable prior and during the transfer. No personal belongings at bedside that could be taken up to his new room. Patient settled in bed per his requests. Report given to receiving RN. Family made aware via telephone call.

## 2014-08-21 NOTE — Consult Note (Signed)
   [facility] CM Inpatient Consult   08/21/2014  [name] 1926/04/23 [id number]   Came to visit patient at bedside to obtain consents for [facility] Care Management services. [facility] RNCM alerted writer that multiple attempts have been made to engage patient prior to admission. Spoke with patient and nieces at bedside to go over [facility] Care Management. Written consents obtained. Explained that he will have follow up whether he goes home or SNF. Patient endorses that he is not sure what his discharge plan will be. Of note, he does live alone and both his wife and children are deceased per niece at bedside. His niece at bedside reports he does have family that supports him however. Patient likely to benefit from SNF at discharge. Will make inpatient RNCM aware of visit.   [name], MSN-Ed, RN,BSN [facility] Liaison [phone]

## 2014-08-21 NOTE — Progress Notes (Addendum)
ANTIBIOTIC CONSULT NOTE - INITIAL  Pharmacy Consult for cefepime Indication: pseudomonas UTI  Allergies  Allergen Reactions  . Sulfa Antibiotics Rash    Nausea     Patient Measurements: Height:  (177.8 cm) Weight: 190 lb 7.6 oz (86.4 kg) IBW/kg (Calculated) : 73  Vital Signs: Temp: 97.5 F (36.4 C) (04/27 1604) Temp Source: Oral (04/27 1604) BP: 100/57 mmHg (04/27 1604) Pulse Rate: 89 (04/27 1604) Intake/Output from previous day: 04/26 0701 - 04/27 0700 In: 2149.2 [P.O.:580; I.V.:1219.2; IV Piggyback:350] Out: 1170 [Urine:1170] Intake/Output from this shift: Total I/O In: 581.5 [P.O.:360; I.V.:171.5; IV Piggyback:50] Out: 575 [Urine:575]  Labs:  Recent Labs  08/19/14 0745 08/20/14 0406 08/21/14 0350  WBC 16.6* 15.4* 8.7  HGB 11.1* 10.6* 9.9*  PLT 132* 127* 87*  CREATININE 4.11* 4.02* 3.91*   Estimated Creatinine Clearance: 14 mL/min (by C-G formula based on Cr of 3.91).  Recent Labs  08/20/14 0406  VANCORANDOM 11.9     Microbiology: Recent Results (from the past 720 hour(s))  Blood Culture (routine x 2)     Status: None (Preliminary result)   Collection Time: 08/05/2014  8:58 PM  Result Value Ref Range Status   Specimen Description BLOOD LEFT HAND  Final   Special Requests BOTTLES DRAWN AEROBIC ONLY 7CC  Final   Culture NO GROWTH 3 DAYS  Final   Report Status PENDING  Incomplete  Blood Culture (routine x 2)     Status: None (Preliminary result)   Collection Time: 08/15/2014  9:05 PM  Result Value Ref Range Status   Specimen Description BLOOD LEFT HAND  Final   Special Requests BOTTLES DRAWN AEROBIC ONLY 7CC  Final   Culture NO GROWTH 3 DAYS  Final   Report Status PENDING  Incomplete  Urine culture     Status: None   Collection Time: 08/17/2014 11:33 PM  Result Value Ref Range Status   Specimen Description URINE, CATHETERIZED  Final   Special Requests NONE  Final   Colony Count   Final    >=100,000 COLONIES/ML Performed at [facility]    Culture   Final    PSEUDOMONAS AERUGINOSA Performed at [facility]    Report Status 08/21/2014 FINAL  Final   Organism ID, Bacteria PSEUDOMONAS AERUGINOSA  Final      Susceptibility   Pseudomonas aeruginosa - MIC*    CEFEPIME 4 SENSITIVE Sensitive     CEFTAZIDIME 16 INTERMEDIATE Intermediate     CIPROFLOXACIN <=0.25 SENSITIVE Sensitive     GENTAMICIN <=1 SENSITIVE Sensitive     IMIPENEM 1 SENSITIVE Sensitive     TOBRAMYCIN <=1 SENSITIVE Sensitive     * PSEUDOMONAS AERUGINOSA  Urine culture     Status: None   Collection Time: 08/19/14  2:48 AM  Result Value Ref Range Status   Specimen Description URINE, SUPRAPUBIC  Final   Special Requests NONE  Final   Colony Count   Final    >=100,000 COLONIES/ML Performed at [facility]    Culture   Final    PSEUDOMONAS AERUGINOSA Performed at [facility]    Report Status 08/21/2014 FINAL  Final   Organism ID, Bacteria PSEUDOMONAS AERUGINOSA  Final      Susceptibility   Pseudomonas aeruginosa - MIC*    CEFEPIME 8 SENSITIVE Sensitive     CEFTAZIDIME 16 INTERMEDIATE Intermediate     CIPROFLOXACIN <=0.25 SENSITIVE Sensitive     GENTAMICIN <=1 SENSITIVE Sensitive     IMIPENEM 1 SENSITIVE Sensitive  TOBRAMYCIN <=1 SENSITIVE Sensitive     * PSEUDOMONAS AERUGINOSA  MRSA PCR Screening     Status: None   Collection Time: 08/19/14  3:11 AM  Result Value Ref Range Status   MRSA by PCR NEGATIVE NEGATIVE Final    Comment:        The GeneXpert MRSA Assay (FDA approved for NASAL specimens only), is one component of a comprehensive MRSA colonization surveillance program. It is not intended to diagnose MRSA infection nor to guide or monitor treatment for MRSA infections.     Medical History: Past Medical History  Diagnosis Date  . Hyperlipidemia   . White coat hypertension   . Cardiomyopathy      EF:30-40% in 06/1998, but 50-55% in 2009;  HX ISCHEMIC CARDIOMYOPATHY  . Chronic atrial  fibrillation   . Vitamin B12 deficiency   . Chronic anticoagulation   . Arthritis   . Sensory neuropathy   . Chronic kidney disease, stage III (moderate)     creatinine 1.5 2001, 2.0 2009, 2.28 in 12/2009  . Mild renal insufficiency   . Hydronephrosis, bilateral   . Ureteral calculi     BILATERAL  . History of CHF (congestive heart failure)     1999  &  2005  . History of kidney stones   . Bilateral renal cysts   . 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 Chronic venous insufficiency   . PAD (peripheral artery disease)   . Edema of both legs   . History of gout     per pt stable  . Glaucoma of both eyes   . Wears hearing aid     bilateral  . CHF (congestive heart failure)   . Shortness of breath dyspnea   . Arrhythmia     Afib CHADS VASC Score of 5.   Assessment: Patient's an 81 y.o M currently on zosyn for LE cellulitis and suspected sepsis.  UCX from 4/25 came back today with Pseudomonas but sensitivity was not reported for zosyn.  I spoke to [facility] and they stated that preliminary tests indicated that pseudomonas is resistant to zosyn but would have to do a second test to confirm.  D/W to Dr. Delton Coombes, ok to d/c zosyn and change to cefepime.  Plan:  - d/c zosyn - cefepime 1gm IV q24h - f/u renal function and adjust dose when appropriate  Liann Spaeth P 08/21/2014,6:25 PM

## 2014-08-21 NOTE — Progress Notes (Signed)
PULMONARY / CRITICAL CARE MEDICINE   Name: [name] MRN: [id number] DOB: [birth year]    ADMISSION DATE:  Sep 04, 2014  REFERRING MD :  Jeani Hawking EDP  CHIEF COMPLAINT:  Sepsis   INITIAL PRESENTATION: 81 y/o male with extensive PMH including Afib on coumadin, CKD III, chronic sCHF (EF 35%), PAD presented 4/24 to [facility] ED with c/o RLE wound with concern for ischemia.  Also found to have acute on CKD, elevated lactate and borderline BP and was tx to [facility] to PCCM with ?sepsis.  Of note pt has been clear in the past that he would never want dialysis.   STUDIES:  CXR 4/24 >> unchanged persistent/chronic R pleural effusion. Hip XR 4/25 >> neg Tib/Fib XR 4/25 >> neg for acute fx, mild knee/ankle joint osteo, extensive peripheral vascular calcification, focal soft tissue swelling  ECHO 4/26 >> mod LV dilation, systolic function severely reduced, EF 30-35%, no RWMA, mod AR, mod MR, RA severely dilated, PA peak 50  SIGNIFICANT EVENTS: 4/24  Admit with hypotension, BLE pain, acute on chronic CKD, elevated lactate, hypotension  4/25  Remains on 10 mcg levo, BP in 70's with excellent mental status 4/26  Neosynephrine gtt 4/27  Weaned off neo, up to chair   SUBJECTIVE:  Weaned off neo gtt, up to chair.  Reports LE's are painful / draining.  Pt reports constipation.   VITAL SIGNS: Temp:  [97 F (36.1 C)-99.3 F (37.4 C)] 97 F (36.1 C) (04/27 0800) Pulse Rate:  [25-152] 38 (04/27 0800) Resp:  [12-25] 18 (04/27 0800) BP: (72-128)/(38-101) 94/54 mmHg (04/27 0800) SpO2:  [91 %-99 %] 96 % (04/27 0800)   HEMODYNAMICS:     VENTILATOR SETTINGS:     INTAKE / OUTPUT:  Intake/Output Summary (Last 24 hours) at 08/21/14 0906 Last data filed at 08/21/14 0900  Gross per 24 hour  Intake 1788.6 ml  Output    945 ml  Net  843.6 ml    PHYSICAL EXAMINATION: General:  Pleasant elderly male in NAD  Neuro:  Awake, alert, appropriate, MAE, generalized weakness  HEENT:  Mm dry, no JVD   Cardiovascular:  s1s2 irregularly irregular, afib on monitor  Lungs:  resps even/non labored on [place], diminished in bases, no wheezing, otherwise clear Abdomen:  Round, soft, +bs, non tender  Musculoskeletal:  BLE 2+ pitting edema, erythema, R>L cellulitis, R foot cool with POS pulses, ecchymosis noted (subj hx of fall with sprain)  LABS:  CBC  Recent Labs Lab 08/19/14 0745 08/20/14 0406 08/21/14 0350  WBC 16.6* 15.4* 8.7  HGB 11.1* 10.6* 9.9*  HCT 33.7* 32.3* 29.7*  PLT 132* 127* 87*   Coag's  Recent Labs Lab 08/19/14 0745 08/20/14 0406 08/21/14 0350  INR 3.12* 2.00* 3.11*   BMET  Recent Labs Lab 08/19/14 0745 08/20/14 0406 08/21/14 0350  NA 132* 133* 134*  K 4.3 4.1 4.0  CL 106 105 106  CO2 16* 18* 18*  BUN 127* 125* 110*  CREATININE 4.11* 4.02* 3.91*  GLUCOSE 107* 105* 113*   Electrolytes  Recent Labs Lab 08/19/14 0745 08/20/14 0406 08/21/14 0350  CALCIUM 7.5* 7.7* 7.8*   Sepsis Markers  Recent Labs Lab 04-Sep-2014 2123 08/19/14 1155  LATICACIDVEN 2.27* 2.0   Liver Enzymes  Recent Labs Lab 08/20/14 0406  AST 16  ALT 14  ALKPHOS 71  BILITOT 1.4*  ALBUMIN 2.7*   Glucose  Recent Labs Lab 08/20/14 1225  GLUCAP 143*    Imaging Dg Chest [facility] 1 [address]  08/20/2014   CLINICAL DATA:  Pleural effusion.  Cardiomyopathy.  EXAM: PORTABLE CHEST - 1 VIEW  COMPARISON:  09/24/2014.  FINDINGS: The heart is enlarged. A moderate size right pleural effusion may be slightly increased this likely layering with patient at night angle. A small left pleural effusion is now evident. Mild edema has increased. Bibasilar airspace disease is noted. The visualized soft tissues and bony thorax are unremarkable.  IMPRESSION: 1. Congestive heart failure with progressive edema. 2. Stable slight increase in right pleural effusion. 3. New small left pleural effusion.   Electronically Signed   By: Marin Roberts M.D.   On: 08/20/2014 07:27     ASSESSMENT /  PLAN:  Bilateral pleural effusion - chronic.  Likely r/t CHF. L>R Pulmonary Edema - in setting of CHF PAH - noted on ECHO, PA peak 50 P: Trend CXR  Supplemental O2 as needed for sats > 90% Pulmonary hygiene: IS, mobilize  Severe Sepsis - likely r/t UTI, +/-cellulitis, +/- component cardiogenic shock  sCHF - EF 30-35% AFib - on chronic coumadin PVD P:  Monitor BP, ? If accurate readings with cuff and hx of PVD Hold home lasix for now with hypotension  Trend BNP Hold home lopressor Amiodarone oral  No CPR  Acute on CKD III - baseline Scr ~2.6 Elevated Lactic Acid P:   Pt would NOT want dialysis  Trend BMP Replace electrolytes as indicated  Decrease NS to KVO ml/hr with CXR (effusions/edema)  Coagulopathy - INR >10, recent abx ~ 5 weeks pta Thrombocytopenia - S/p FFP, Vit K  P:  Trend CBC, Coags Restart coumadin per pharmacy 4/25  BLE Cellulitis RLE Wound - injured with leg bag Pseudomonas UTI  P:   BCx2 4/24 >> UC 4/24 >> pseudomonas aeruginosa >> intermediate to ceftazidime, otherwise sensitive  UA 4/24 >> positive for UTI (wbc tntc, mod leuks, many bacteria)  Vancomycin 4/25>>>4/27 Zosyn 4/25>>>  WOC for LE wounds  D/C vanco as pt is afebrile, no WBC Consider narrow zosyn in am 4/28  Pain - BLE, fracture ruled out.  Suspect related to LE edema.  Low suspicion for dvt with INR > 10 on admit.   Deconditioning   P: PT consult for deconditioning, hx of falls  Activity as tolerated   Constipation  P:  Senakot-S  GOALS of CARE:  Discussion had overnight 4/24 with patient regarding GOC.  He initially wanted to be a DNR.  His sister became involved in his care and code status was reversed.  Clarified with patient and niece in the room 4/25.  He wishes for medical care that would continue to allow him to remain independent.  He does not want dialysis under any circumstance.  Discussed CPR with the patient and he would not want CPR in the event of arrest.  Further  discussed mechanical ventilation with patient and he would be open to MV support pending the reason he would require it.  Ex: if he had a catastrophic stroke, he would not want it and would desire natural death.  However, if a reversible process he would be in favor of support if needed.    Canary Brim, NP-C [facility] Pgr: [phone] or [phone]  08/21/2014  9:06 AM  Attending:  I have seen and examined the patient with nurse practitioner/resident and agree with the note above.   Mr [name] shock has resolved, he has a UTI.  Echo unchanged.  Exam: comfortable in chair, redness in legs noted bilaterally unchanged  UTI> narrow to pseudomonas coverage CHF> stable, ensure cardiology f/u as outpatient Shock> resolved, d/c neo  Move to floor, TRH service, PCCM off  Heber [place], MD [place] [place] [facility] Pager: [phone] Cell: [phone] If no response, call [phone]

## 2014-08-22 ENCOUNTER — Inpatient Hospital Stay (HOSPITAL_COMMUNITY): Payer: Medicare Other

## 2014-08-22 ENCOUNTER — Ambulatory Visit (INDEPENDENT_AMBULATORY_CARE_PROVIDER_SITE_OTHER): Payer: Medicare Other | Admitting: Otolaryngology

## 2014-08-22 DIAGNOSIS — N390 Urinary tract infection, site not specified: Secondary | ICD-10-CM | POA: Diagnosis present

## 2014-08-22 DIAGNOSIS — J9601 Acute respiratory failure with hypoxia: Secondary | ICD-10-CM

## 2014-08-22 DIAGNOSIS — I5023 Acute on chronic systolic (congestive) heart failure: Secondary | ICD-10-CM | POA: Diagnosis present

## 2014-08-22 DIAGNOSIS — J9600 Acute respiratory failure, unspecified whether with hypoxia or hypercapnia: Secondary | ICD-10-CM | POA: Diagnosis present

## 2014-08-22 DIAGNOSIS — I959 Hypotension, unspecified: Secondary | ICD-10-CM | POA: Insufficient documentation

## 2014-08-22 DIAGNOSIS — A4152 Sepsis due to Pseudomonas: Secondary | ICD-10-CM

## 2014-08-22 DIAGNOSIS — N189 Chronic kidney disease, unspecified: Secondary | ICD-10-CM

## 2014-08-22 DIAGNOSIS — I509 Heart failure, unspecified: Secondary | ICD-10-CM | POA: Insufficient documentation

## 2014-08-22 DIAGNOSIS — I482 Chronic atrial fibrillation: Secondary | ICD-10-CM

## 2014-08-22 DIAGNOSIS — L03115 Cellulitis of right lower limb: Secondary | ICD-10-CM | POA: Diagnosis present

## 2014-08-22 DIAGNOSIS — R570 Cardiogenic shock: Secondary | ICD-10-CM

## 2014-08-22 DIAGNOSIS — I4891 Unspecified atrial fibrillation: Secondary | ICD-10-CM | POA: Diagnosis present

## 2014-08-22 LAB — CBC
HCT: 30.6 % — ABNORMAL LOW (ref 39.0–52.0)
Hemoglobin: 9.8 g/dL — ABNORMAL LOW (ref 13.0–17.0)
MCH: 30.4 pg (ref 26.0–34.0)
MCHC: 32 g/dL (ref 30.0–36.0)
MCV: 95 fL (ref 78.0–100.0)
Platelets: 88 10*3/uL — ABNORMAL LOW (ref 150–400)
RBC: 3.22 MIL/uL — ABNORMAL LOW (ref 4.22–5.81)
RDW: 19.4 % — AB (ref 11.5–15.5)
WBC: 7.2 10*3/uL (ref 4.0–10.5)

## 2014-08-22 LAB — BASIC METABOLIC PANEL
Anion gap: 10 (ref 5–15)
BUN: 121 mg/dL — AB (ref 6–23)
CO2: 17 mmol/L — AB (ref 19–32)
Calcium: 8 mg/dL — ABNORMAL LOW (ref 8.4–10.5)
Chloride: 106 mmol/L (ref 96–112)
Creatinine, Ser: 3.78 mg/dL — ABNORMAL HIGH (ref 0.50–1.35)
GFR calc Af Amer: 15 mL/min — ABNORMAL LOW (ref >90)
GFR, EST NON AFRICAN AMERICAN: 13 mL/min — AB (ref >90)
GLUCOSE: 91 mg/dL (ref 70–99)
Potassium: 4.4 mmol/L (ref 3.5–5.1)
SODIUM: 133 mmol/L — AB (ref 135–145)

## 2014-08-22 LAB — TROPONIN I
Troponin I: <0.03 ng/mL (ref <0.031)
Troponin I: 0.03 ng/mL (ref <0.031)

## 2014-08-22 LAB — BRAIN NATRIURETIC PEPTIDE: B NATRIURETIC PEPTIDE 5: 647.7 pg/mL — AB (ref 0.0–100.0)

## 2014-08-22 LAB — LACTIC ACID, PLASMA: Lactic Acid, Venous: 1.4 mmol/L (ref 0.5–2.0)

## 2014-08-22 LAB — PROTIME-INR
INR: 4.05 — AB (ref 0.00–1.49)
Prothrombin Time: 39.7 seconds — ABNORMAL HIGH (ref 11.6–15.2)

## 2014-08-22 MED ORDER — DILTIAZEM LOAD VIA INFUSION
5.0000 mg | Freq: Once | INTRAVENOUS | Status: AC
  Administered 2014-08-22: 5 mg via INTRAVENOUS
  Filled 2014-08-22: qty 5

## 2014-08-22 MED ORDER — DEXTROSE 5 % IV SOLN
5.0000 mg/h | INTRAVENOUS | Status: DC
  Filled 2014-08-22: qty 100

## 2014-08-22 MED ORDER — DEXTROSE 5 % IV SOLN
5.0000 mg/h | INTRAVENOUS | Status: DC
  Administered 2014-08-22: 5 mg/h via INTRAVENOUS

## 2014-08-22 MED ORDER — AMIODARONE HCL IN DEXTROSE 360-4.14 MG/200ML-% IV SOLN
60.0000 mg/h | INTRAVENOUS | Status: AC
  Administered 2014-08-22 (×2): 60 mg/h via INTRAVENOUS
  Filled 2014-08-22 (×3): qty 200

## 2014-08-22 MED ORDER — PHENYLEPHRINE HCL 10 MG/ML IJ SOLN
0.0000 ug/min | INTRAVENOUS | Status: DC
  Filled 2014-08-22: qty 1

## 2014-08-22 MED ORDER — AMIODARONE LOAD VIA INFUSION
150.0000 mg | Freq: Once | INTRAVENOUS | Status: AC
  Administered 2014-08-22: 150 mg via INTRAVENOUS
  Filled 2014-08-22: qty 83.34

## 2014-08-22 MED ORDER — DOXYCYCLINE HYCLATE 100 MG PO TABS
100.0000 mg | ORAL_TABLET | Freq: Two times a day (BID) | ORAL | Status: DC
  Administered 2014-08-22 – 2014-08-23 (×3): 100 mg via ORAL
  Filled 2014-08-22 (×4): qty 1

## 2014-08-22 MED ORDER — METOPROLOL TARTRATE 1 MG/ML IV SOLN: 5.0000 mg | Freq: Once | INTRAVENOUS | Status: DC

## 2014-08-22 MED ORDER — AMIODARONE HCL IN DEXTROSE 360-4.14 MG/200ML-% IV SOLN
30.0000 mg/h | INTRAVENOUS | Status: DC
  Administered 2014-08-23 – 2014-08-25 (×5): 30 mg/h via INTRAVENOUS
  Filled 2014-08-22 (×6): qty 200

## 2014-08-22 MED ORDER — ATORVASTATIN CALCIUM 10 MG PO TABS: 10.0000 mg | ORAL_TABLET | Freq: Every day | ORAL | Status: DC

## 2014-08-22 MED ORDER — DILTIAZEM LOAD VIA INFUSION
10.0000 mg | Freq: Once | INTRAVENOUS | Status: DC
  Filled 2014-08-22: qty 10

## 2014-08-22 NOTE — Progress Notes (Signed)
PULMONARY / CRITICAL CARE MEDICINE   Name: [name] MRN: [id number] DOB: 1925-05-20    ADMISSION DATE:  09/22/2014  REFERRING MD :  Jeani Hawking EDP  CHIEF COMPLAINT:  Sepsis   INITIAL PRESENTATION: 81 y/o male with extensive PMH including Afib on coumadin, CKD III, chronic sCHF (EF 35%), PAD presented 4/24 to [facility] ED with c/o RLE wound with concern for ischemia.  Also found to have acute on CKD, elevated lactate and borderline BP and was tx to [facility] to PCCM with ?sepsis.  Of note pt has been clear in the past that he would never want dialysis.   STUDIES:  CXR 4/24 >> unchanged persistent/chronic R pleural effusion. Hip XR 4/25 >> neg Tib/Fib XR 4/25 >> neg for acute fx, mild knee/ankle joint osteo, extensive peripheral vascular calcification, focal soft tissue swelling  ECHO 4/26 >> mod LV dilation, systolic function severely reduced, EF 30-35%, no RWMA, mod AR, mod MR, RA severely dilated, PA peak 50  SIGNIFICANT EVENTS: 4/24  Admit with hypotension, BLE pain, acute on chronic CKD, elevated lactate, hypotension  4/25  Remains on 10 mcg levo, BP in 70's with excellent mental status 4/26  Neosynephrine gtt 4/27  Weaned off neo, up to chair 4/28 back to ICU hypotension, afib   SUBJECTIVE:  Went to floor, didn't sleep well, went into afib with RVR 4/28 AM, hypotensive  VITAL SIGNS: Temp:  [97.5 F (36.4 C)-97.9 F (36.6 C)] 97.8 F (36.6 C) (04/28 0457) Pulse Rate:  [89-126] 120 (04/28 1150) Resp:  [18] 18 (04/28 0457) BP: (81-100)/(53-71) 87/60 mmHg (04/28 1150) SpO2:  [97 %-100 %] 100 % (04/28 1123)   HEMODYNAMICS:     VENTILATOR SETTINGS:     INTAKE / OUTPUT:  Intake/Output Summary (Last 24 hours) at 08/22/14 1201 Last data filed at 08/22/14 1131  Gross per 24 hour  Intake 419.67 ml  Output    850 ml  Net -430.33 ml    PHYSICAL EXAMINATION: General:  Sitting up in bed, no distress HENT: NCAT EOMi Eyes: EOMi, anicteric PULM: Few crackles bases,  normal effort CV: Irreg irreg, JVD elevated GI: BS+, soft Ext: massive edema Derm: redness in legs unchanged, hematoma dressed  LABS:  CBC  Recent Labs Lab 08/20/14 0406 08/21/14 0350 08/22/14 0543  WBC 15.4* 8.7 7.2  HGB 10.6* 9.9* 9.8*  HCT 32.3* 29.7* 30.6*  PLT 127* 87* 88*   Coag's  Recent Labs Lab 08/20/14 0406 08/21/14 0350 08/22/14 0543  INR 2.00* 3.11* 4.05*   BMET  Recent Labs Lab 08/20/14 0406 08/21/14 0350 08/22/14 0543  NA 133* 134* 133*  K 4.1 4.0 4.4  CL 105 106 106  CO2 18* 18* 17*  BUN 125* 110* 121*  CREATININE 4.02* 3.91* 3.78*  GLUCOSE 105* 113* 91   Electrolytes  Recent Labs Lab 08/20/14 0406 08/21/14 0350 08/22/14 0543  CALCIUM 7.7* 7.8* 8.0*   Sepsis Markers  Recent Labs Lab 09/08/2014 2123 08/19/14 1155  LATICACIDVEN 2.27* 2.0   Liver Enzymes  Recent Labs Lab 08/20/14 0406  AST 16  ALT 14  ALKPHOS 71  BILITOT 1.4*  ALBUMIN 2.7*   Glucose  Recent Labs Lab 08/20/14 1225  GLUCAP 143*    Imaging Dg Chest Port 1 View  08/21/2014   CLINICAL DATA:  Pleural effusion.  EXAM: PORTABLE CHEST - 1 VIEW  COMPARISON:  09/19/2014.  FINDINGS: Mediastinum hilar structures stable. Prior median sternotomy. Stable cardiomegaly. Mild pulmonary vascular prominence and interstitial prominence suggesting mild  congestive heart failure. Right pleural effusion again noted and unchanged. Tiny left pleural effusion cannot be excluded. No pneumothorax.  IMPRESSION: Findings consistent with mild congestive heart failure with mild pulmonary interstitial edema and stable right pleural effusion. Tiny left pleural effusion cannot be excluded . Prior median sternotomy.   Electronically Signed   By: Maisie Fus  Register   On: 08/21/2014 07:14     ASSESSMENT / PLAN:  Chronic bilateral effusions due to CHF Acute pulmonary edema due to acute decompensated heart failure P: Hold diuresis now due to hypotension Supplemental O2 as needed for sats >  90% Pulmonary hygiene: IS, mobilize CXR stat  Severe sepsis> resolved Shock 4/28 AM> likely cardiogenic, doubt if he is far from baseline.  Worsened by afib with RVR Acute systolic heart failure EF 30-35%  AFib with RVR PVD P:  IV amiodarone Stop dilt gtt as contributing to hypotension STAT lactic acid, if elevated start neo Cardiology consult> would like them to comment on overall prognosis.  He appears to have a very narrow window for normal perfusion, and given his poor mobility at baseline I don't think he is a good candidate for inotropes  Acute on CKD III - baseline Scr ~2.6, improving P:   Pt would NOT want dialysis  Monitor BMET and UOP Replace electrolytes as needed  Coagulopathy due to warfarin - INR had improved pos  Thrombocytopenia - S/p FFP, Vit K  P:  Trend CBC, Coags Restart coumadin per pharmacy 4/25  ID Hypotension on 4/28 > think this is cardiogenic as his WBC is down, his is afebrile BLE Cellulitis > improved RLE Wound - injured with leg bag Pseudomonas UTI  P:   BCx2 4/24 >> UC 4/24 >> pseudomonas aeruginosa >> intermediate to ceftazidime, otherwise sensitive   Vancomycin 4/25>>>4/27 Zosyn 4/25>>>  WOC for LE wounds   Neuro Pain - BLE, fracture ruled out.  Suspect related to LE edema.  Low suspicion for dvt with INR > 10 on admit.   Deconditioning   P: PT consult for deconditioning, hx of falls  Activity as tolerated   Constipation  P:  [place]ot-S  GOALS of CARE:  No CPR, OK with short term intubation  Family : updated sister on 4/28 by phone  Today's summary: He is going back to the ICU, this time for what appears to be decompensated heart failure exacerbated by afib with RVR,  Given his overall poor health, I think that he is not a great candidate for inotropes or aggressive interventions.    Heber Kelayres, MD [place] [facility] Pager: [phone] Cell: [phone] If no response, call [phone]

## 2014-08-22 NOTE — Progress Notes (Signed)
  Amiodarone Drug - Drug Interaction Consult Note  Recommendations: Amiodarone can lead to increased levels of statins (pt now low Lipitor 10 mg daily). Monitor for evidence of toxicity (e.g., myalgia, liver function test elevations, rhabdomyolysis, etc.).   Pharmacy will monitor for interaction with warfarin, but this interaction will likely take a few weeks to see effect on INR.  Monitor for bradycardia while on diltiazem.  Amiodarone is metabolized by the cytochrome P450 system and therefore has the potential to cause many drug interactions. Amiodarone has an average plasma half-life of 50 days (range 20 to 100 days). Warfarin currently on hold for supratherapeutic levels.  There is potential for drug interactions to occur several weeks or months after stopping treatment and the onset of drug interactions may be slow after initiating amiodarone.   [x]  Statins: Increased risk of myopathy. Simvastatin- restrict dose to 20mg  daily. Other statins: counsel patients to report any muscle pain or weakness immediately.  [x]  Anticoagulants: Amiodarone can increase anticoagulant effect. Consider warfarin dose reduction. Patients should be monitored closely and the dose of anticoagulant altered accordingly, remembering that amiodarone levels take several weeks to stabilize.  []  Antiepileptics: Amiodarone can increase plasma concentration of phenytoin, the dose should be reduced. Note that small changes in phenytoin dose can result in large changes in levels. Monitor patient and counsel on signs of toxicity.  []  Beta blockers: increased risk of bradycardia, AV block and myocardial depression. Sotalol - avoid concomitant use.  [x]   Calcium channel blockers (diltiazem and verapamil): increased risk of bradycardia, AV block and myocardial depression.  []   Cyclosporine: Amiodarone increases levels of cyclosporine. Reduced dose of cyclosporine is recommended.  []  Digoxin dose should be halved when amiodarone  is started.  []  Diuretics: increased risk of cardiotoxicity if hypokalemia occurs.  []  Oral hypoglycemic agents (glyburide, glipizide, glimepiride): increased risk of hypoglycemia. Patient's glucose levels should be monitored closely when initiating amiodarone therapy.   []  Drugs that prolong the QT interval:  Torsades de pointes risk may be increased with concurrent use - avoid if possible.  Monitor QTc, also keep magnesium/potassium WNL if concurrent therapy can't be avoided. Marland Kitchen Antibiotics: e.g. fluoroquinolones, erythromycin. . Antiarrhythmics: e.g. quinidine, procainamide, disopyramide, sotalol. . Antipsychotics: e.g. phenothiazines, haloperidol.  . Lithium, tricyclic antidepressants, and methadone. Thank You,  [name]  08/22/2014 12:18 PM

## 2014-08-22 NOTE — Progress Notes (Signed)
[facility] PCCM  Length conversation with patient and sister. He wants to be able to walk again and leave the hospital.  However considering his heart disease and deconditioning, he would do very poorly with mechanical ventilation.  I explained the details of this with him.  He stated that he would prefer to not go on life support.  Will change code status to full DNR  Heber [place], MD [place] [facility] Pager: [phone] Cell: [phone] If no response, call [phone]

## 2014-08-22 NOTE — Progress Notes (Signed)
The patient had 6 beats of V-Tach.  PCP  was contacted. Awaiting any orders.

## 2014-08-22 NOTE — Progress Notes (Signed)
INITIAL NUTRITION ASSESSMENT  DOCUMENTATION CODES Per approved criteria  -Non-severe (moderate) malnutrition in the context of chronic illness  Pt meets criteria for moderate MALNUTRITION in the context of chronic illness as evidenced by moderate fat and muscle depletion.  INTERVENTION: - Continue Magic cup TID with meals, each supplement provides 290 kcal and 9 grams of protein - Encourage PO intake  NUTRITION DIAGNOSIS: Increased nutrient needs related to wound healing as evidenced by two stage I pressure ulcers, wounds on lower legs. -ongoing  Goal: Pt to meet >/= 90% of their estimated nutrition needs   Monitor:  PO and supplemental intake, GOC, weight, labs, I/O's  Reason for Assessment: Braden score of 12 or less  Admitting Dx: Sepsis  ASSESSMENT: 81yo male with extensive PMH including Afib on coumadin, CKD III, chronic sCHF (EF 35%), PAD presented 4/24 to [facility] ED with c/o RLE wound with concern for ischemia. Also found to have acute on CKD, elevated lactate and borderline BP and was tx to [facility] to PCCM with ?sepsis.   Pt eating 25-90% at meals since last assessment. Pt reports good appetite overall. Pt with mumbled speech and difficult to understand. He refuses to answer further questions reporting that he needs cleaned up and is waiting for assistance. Unable to assess if he is using supplement.  Pt likely meeting needs. Labs and medications reviewed; Na: 133 mmol/L, BUN/creatinine elevated, GFR: 13.  Pt is a transfer from ICU/SDU yesterday and has stated, per MD notes, that he never wants HD.  Height: Ht Readings from Last 1 Encounters:  08/19/14  (1.778 m)    Weight: Wt Readings from Last 1 Encounters:  08/19/14 190 lb 7.6 oz (86.4 kg)    Wt Readings from Last 10 Encounters:  08/19/14 190 lb 7.6 oz (86.4 kg)  08/07/14 192 lb 8 oz (87.317 kg)  08/02/14 189 lb (85.73 kg)  07/19/14 193 lb (87.544 kg)  07/12/14 193 lb (87.544 kg)  07/04/14  189 lb 6 oz (85.9 kg)  07/01/14 193 lb 9.6 oz (87.816 kg)  07/01/14 194 lb (87.998 kg)  06/26/14 193 lb 8 oz (87.771 kg)  06/04/14 190 lb (86.183 kg)    BMI:  Body mass index is 27.33 kg/(m^2).  Estimated Nutritional Needs: Kcal: 1900-2100 Protein: 100-110g Fluid: 1.9L/day  Skin: Two Stage I pressure ulcers, wounds on lower legs  Diet Order: Diet Heart Room service appropriate?: Yes; Fluid consistency:: Thin    Intake/Output Summary (Last 24 hours) at 08/22/14 1103 Last data filed at 08/22/14 0300  Gross per 24 hour  Intake 429.67 ml  Output    850 ml  Net -420.33 ml    Last BM: 4/27  Labs:   Recent Labs Lab 08/20/14 0406 08/21/14 0350 08/22/14 0543  NA 133* 134* 133*  K 4.1 4.0 4.4  CL 105 106 106  CO2 18* 18* 17*  BUN 125* 110* 121*  CREATININE 4.02* 3.91* 3.78*  CALCIUM 7.7* 7.8* 8.0*  GLUCOSE 105* 113* 91    CBG (last 3)   Recent Labs  08/20/14 1225  GLUCAP 143*    Scheduled Meds: . amiodarone  200 mg Oral BID  . ceFEPime (MAXIPIME) IV  1 g Intravenous Q24H  . latanoprost  1 drop Both Eyes QHS  . metoprolol  5 mg Intravenous Once  . simvastatin  20 mg Oral Daily  . vitamin B-12  1,000 mcg Oral Daily  . Warfarin - Pharmacist Dosing Inpatient   Does not apply [phone]  Continuous Infusions: . sodium chloride 10 mL/hr at 08/21/14 1030    Past Medical History  Diagnosis Date  . Hyperlipidemia   . White coat hypertension   . Cardiomyopathy      EF:30-40% in 06/1998, but 50-55% in 2009;  HX ISCHEMIC CARDIOMYOPATHY  . Chronic atrial fibrillation   . Vitamin B12 deficiency   . Chronic anticoagulation   . Arthritis   . Sensory neuropathy   . Chronic kidney disease, stage III (moderate)     creatinine 1.5 2001, 2.0 2009, 2.28 in 12/2009  . Mild renal insufficiency   . Hydronephrosis, bilateral   . Ureteral calculi     BILATERAL  . History of CHF (congestive heart failure)     1999  &  2005  . History of kidney stones   . Bilateral renal  cysts   . 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 Chronic venous insufficiency   . PAD (peripheral artery disease)   . Edema of both legs   . History of gout     per pt stable  . Glaucoma of both eyes   . Wears hearing aid     bilateral  . CHF (congestive heart failure)   . Shortness of breath dyspnea   . Arrhythmia     Afib CHADS VASC Score of 5.    Past Surgical History  Procedure Laterality Date  . Repair thoracic aorta  01/1995    s/p rupture  . Cataract extraction w/ intraocular lens  implant, bilateral  2012  . 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 Laparoscopic cholecystectomy  JAN 2005  . Percutaneous nephrostolithotomy  1970's  . Abdominal aortagram  07-16-2003  DR ROTHBART    W/ COIL EMBOLIZATION OF SIX BRANCHES OF RIGHT INTERNAL RENAL ARTERY ANEURYSM  . Cardiovascular stress test  04-25-2003    LOW RISK CARDIOLITE STUDY/ MODERATE LV DILATATION AND IMPAIRMENT OF LVSF/  NO ISCHEMIA  . Transthoracic echocardiogram  02-07-2008  DR ROTHBART    MODERATE DILATED LV/  EF 50-55%/ MILD AR/ MILD TO MODERATE AORTIC ROOT DILATATION/ MODERATE MR/ MODERATE LA   &  RA   DIILATED/ MILD TO MODERATE TR  . Cystoscopy w/ ureteral stent placement Bilateral 10/24/2012    Procedure: CYSTOSCOPY WITH RETROGRADE PYELOGRAM/URETERAL STENT PLACEMENT;  Surgeon: Lindaann Slough, MD;  Location: [facility];  Service: Urology;  Laterality: Bilateral;  . Cystoscopy with ureteroscopy Left 11/27/2012    Procedure: CYSTOSCOPY,LEFT URETEROSCOPY WITH LASER LITHOTRIPSY/REMOVAL OF MIGRATED STENT, PLACEMENT OF LEFT STENT;  Surgeon: Garnett Farm, MD;  Location: [facility];  Service: Urology;  Laterality: Left;  . Holmium laser application Left 11/27/2012    Procedure: HOLMIUM LASER APPLICATION;  Surgeon: Garnett Farm, MD;  Location: [facility];   Service: Urology;  Laterality: Left;  . Cystoscopy with ureteroscopy and stent placement N/A 12/29/2012    Procedure: CYSTOSCOPY WITH URETEROSCOPY AND STENT PLACEMENT, removal of right and left stents, retrogrades, right stent exchange;  Surgeon: Garnett Farm, MD;  Location: [facility];  Service: Urology;  Laterality: N/A;  . Holmium laser application Right 12/29/2012    Procedure: HOLMIUM LASER APPLICATION;  Surgeon: Garnett Farm, MD;  Location: [facility];  Service: Urology;  Laterality: Right;  HLL OF (RT) URETERAL PELVIC JUNCTION STONE  . Cystoscopy with  retrograde pyelogram, ureteroscopy and stent placement Right 01/29/2013    Procedure: CYSTOSCOPY WITH RIGHT URETEROSCOPY AND STENT PLACEMENT;  Surgeon: Garnett Farm, MD;  Location: [facility];  Service: Urology;  Laterality: Right;  . Holmium laser application N/A 01/29/2013    Procedure: HOLMIUM LASER APPLICATION;  Surgeon: Garnett Farm, MD;  Location: [facility];  Service: Urology;  Laterality: N/A;  . Esophagogastroduodenoscopy N/A 02/16/2013    Procedure: ESOPHAGOGASTRODUODENOSCOPY (EGD);  Surgeon: Meryl Dare, MD;  Location: [facility];  Service: Endoscopy;  Laterality: N/A;  . Cystoscopy with retrograde pyelogram, ureteroscopy and stent placement Right 05/07/2013    Procedure: CYSTOSCOPY WITH RIGHT RETROGRADE PYELOGRAM, Balloon dilation of right ureter, Laser incision of right ureter, Nephrostogram;  Surgeon: Garnett Farm, MD;  Location: [facility];  Service: Urology;  Laterality: Right;  . Holmium laser application Right 05/07/2013    Procedure: HOLMIUM LASER APPLICATION;  Surgeon: Garnett Farm, MD;  Location: [facility];  Service: Urology;  Laterality: Right;  . Cystoscopy with retrograde pyelogram, ureteroscopy and stent placement Left 07/15/2014    Procedure: LEFT URETEROSCOPY RETROGRADE PYELOGRAM TRANSECTION OF BLADDER TUMOR WITH BLADDER LESION BIOPSIES;  Surgeon: Ihor Gully, MD;  Location: [facility];  Service: Urology;  Laterality: Left;      Trenton Gammon, RD, LDN Inpatient Clinical Dietitian Pager # [phone] After hours/weekend pager # [phone]

## 2014-08-22 NOTE — Consult Note (Signed)
Admit date: 08/21/2014 Referring Physician  Dr. Kendrick Fries Primary Physician Lubertha South, MD Primary Cardiologist  Dr. Wyline Mood  Reason for Consultation  Afib - CHF - prognosis  HPI: 81 year old male with systolic heart failure, cardiomyopathy ejection fraction 30-35%, iliac artery aneurysm followed by Dr. Edilia Bo, chronic kidney disease, hypotension as outpatient, hyperlipidemia with challenging control atrial fibrillation even as an outpatient secondary to blood pressure as low as 90/60 here to comment on atrial fibrillation, heart failure and prognosis.  In review of outpatient office notes, he had been taken off of digoxin secondary to kidney disease. Low blood pressure prohibited the use of Toprol. He remained on Coumadin therapy and was followed in the [place] office.  He was admitted with right lower extremity wound, concern for ischemia and questionable sepsis.  Echocardiogram on 08/20/14 showed EF 30-35% with moderate MR and PA pressures of 50 mmHg.  There was concerned about his hypotension and elevated lactate. He was on levo fed 10 g with blood pressures in the 70s with excellent mental status. Finally, he was weaned off of the Neo-Synephrine on 4/27. He then came back to the intensive care unit on 4/28 secondary to hypotension once again and atrial fibrillation.  He was placed on IV amiodarone and diltiazem drip was stopped because of contribution to hypotension. Serum creatinine is 2.6 improving.  He seems fairly comfortable in his ICU bed. No significant increased work of breathing, no dizziness, no chest pain.  Earlier today, Dr. Kathrin Penner discussed his overall condition and his CODE STATUS was changed to DO NOT RESUSCITATE. He has previously said he would not want to go on dialysis and I once again reiterated to him that he would not want to go on mechanical ventilation, he agreed.  PMH:   Past Medical History  Diagnosis Date  . Hyperlipidemia   . White coat hypertension    . Cardiomyopathy      EF:30-40% in 06/1998, but 50-55% in 2009;  HX ISCHEMIC CARDIOMYOPATHY  . Chronic atrial fibrillation   . Vitamin B12 deficiency   . Chronic anticoagulation   . Arthritis   . Sensory neuropathy   . Chronic kidney disease, stage III (moderate)     creatinine 1.5 2001, 2.0 2009, 2.28 in 12/2009  . Mild renal insufficiency   . Hydronephrosis, bilateral   . Ureteral calculi     BILATERAL  . History of CHF (congestive heart failure)     1999  &  2005  . History of kidney stones   . Bilateral renal cysts   . 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 Chronic venous insufficiency   . PAD (peripheral artery disease)   . Edema of both legs   . History of gout     per pt stable  . Glaucoma of both eyes   . Wears hearing aid     bilateral  . CHF (congestive heart failure)   . Shortness of breath dyspnea   . Arrhythmia     Afib CHADS VASC Score of 5.    PSH:   Past Surgical History  Procedure Laterality Date  . Repair thoracic aorta  01/1995    s/p rupture  . Cataract extraction w/ intraocular lens  implant, bilateral  2012  . 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 Laparoscopic  cholecystectomy  JAN 2005  . Percutaneous nephrostolithotomy  1970's  . Abdominal aortagram  07-16-2003  DR ROTHBART    W/ COIL EMBOLIZATION OF SIX BRANCHES OF RIGHT INTERNAL RENAL ARTERY ANEURYSM  . Cardiovascular stress test  04-25-2003    LOW RISK CARDIOLITE STUDY/ MODERATE LV DILATATION AND IMPAIRMENT OF LVSF/  NO ISCHEMIA  . Transthoracic echocardiogram  02-07-2008  DR ROTHBART    MODERATE DILATED LV/  EF 50-55%/ MILD AR/ MILD TO MODERATE AORTIC ROOT DILATATION/ MODERATE MR/ MODERATE LA   &  RA   DIILATED/ MILD TO MODERATE TR  . Cystoscopy w/ ureteral stent placement Bilateral 10/24/2012    Procedure: CYSTOSCOPY WITH  RETROGRADE PYELOGRAM/URETERAL STENT PLACEMENT;  Surgeon: Lindaann Slough, MD;  Location: [facility] [place];  Service: Urology;  Laterality: Bilateral;  . Cystoscopy with ureteroscopy Left 11/27/2012    Procedure: CYSTOSCOPY,LEFT URETEROSCOPY WITH LASER LITHOTRIPSY/REMOVAL OF MIGRATED STENT, PLACEMENT OF LEFT STENT;  Surgeon: Garnett Farm, MD;  Location: [facility];  Service: Urology;  Laterality: Left;  . Holmium laser application Left 11/27/2012    Procedure: HOLMIUM LASER APPLICATION;  Surgeon: Garnett Farm, MD;  Location: [facility];  Service: Urology;  Laterality: Left;  . Cystoscopy with ureteroscopy and stent placement N/A 12/29/2012    Procedure: CYSTOSCOPY WITH URETEROSCOPY AND STENT PLACEMENT, removal of right and left stents, retrogrades, right stent exchange;  Surgeon: Garnett Farm, MD;  Location: [facility];  Service: Urology;  Laterality: N/A;  . Holmium laser application Right 12/29/2012    Procedure: HOLMIUM LASER APPLICATION;  Surgeon: Garnett Farm, MD;  Location: [facility];  Service: Urology;  Laterality: Right;  HLL OF (RT) URETERAL PELVIC JUNCTION STONE  . Cystoscopy with retrograde pyelogram, ureteroscopy and stent placement Right 01/29/2013    Procedure: CYSTOSCOPY WITH RIGHT URETEROSCOPY AND STENT PLACEMENT;  Surgeon: Garnett Farm, MD;  Location: [facility];  Service: Urology;  Laterality: Right;  . Holmium laser application N/A 01/29/2013    Procedure: HOLMIUM LASER APPLICATION;  Surgeon: Garnett Farm, MD;  Location: [facility];  Service: Urology;  Laterality: N/A;  . Esophagogastroduodenoscopy N/A 02/16/2013    Procedure: ESOPHAGOGASTRODUODENOSCOPY (EGD);  Surgeon: Meryl Dare, MD;  Location: [facility];  Service: Endoscopy;  Laterality: N/A;  . Cystoscopy with retrograde pyelogram, ureteroscopy and stent placement Right 05/07/2013    Procedure: CYSTOSCOPY WITH RIGHT RETROGRADE PYELOGRAM, Balloon dilation of right ureter, Laser incision of right ureter, Nephrostogram;  Surgeon: Garnett Farm, MD;  Location: [facility];  Service: Urology;  Laterality: Right;  . Holmium laser application Right 05/07/2013    Procedure: HOLMIUM LASER APPLICATION;  Surgeon: Garnett Farm, MD;  Location: [facility];  Service: Urology;  Laterality: Right;  . Cystoscopy with retrograde pyelogram, ureteroscopy and stent placement Left 07/15/2014    Procedure: LEFT URETEROSCOPY RETROGRADE PYELOGRAM TRANSECTION OF BLADDER TUMOR WITH BLADDER LESION BIOPSIES;  Surgeon: Ihor Gully, MD;  Location: [facility];  Service: Urology;  Laterality: Left;   Allergies:  Sulfa antibiotics Prior to Admit Meds:   Prior to Admission medications   Medication Sig Start Date End Date Taking? Authorizing Provider  furosemide (LASIX) 40 MG tablet Take 1.5 tablets (60 mg total) by mouth daily. Patient taking differently: Take 60 mg by mouth 2 (two) times daily.  08/05/14  Yes Jodelle Gross, NP  HYDROcodone-acetaminophen (NORCO/VICODIN) 5-325 MG per tablet Take one half to one tablet every 4 hours prn severe pain. Caution drowsiness. 08/08/14  Yes Babs Sciara, MD  simvastatin (  ZOCOR) 20 MG tablet Take 20 mg by mouth daily.   Yes Historical Provider, MD  traZODone (DESYREL) 50 MG tablet Take 0.5 tablets (25 mg total) by mouth at bedtime. 07/25/14  Yes Merlyn Albert, MD  vitamin B-12 (CYANOCOBALAMIN) 1000 MCG tablet Take 1,000 mcg by mouth daily.   Yes Historical Provider, MD  warfarin (COUMADIN) 5 MG tablet Take 0-2.5 mg by mouth See admin instructions. Takes half of a 5mg  tablet daily except nothing on Wednesdays.   Yes Historical Provider, MD  acetaminophen (TYLENOL) 500 MG tablet Take 1,000 mg by mouth every 6 (six) hours as needed for mild pain, moderate pain or headache.     Historical Provider, MD  colchicine (COLCRYS) 0.6 MG tablet TAKE ONE TABLET BY MOUTH 2 TIMES A DAY FOR GOUT. Patient taking differently: Take 0.6 mg by mouth daily as needed (Gout).  12/24/13   Merlyn Albert, MD  latanoprost (XALATAN) 0.005 %  ophthalmic solution Place 1 drop into both eyes at bedtime.  08/12/12   Historical Provider, MD  metolazone (ZAROXOLYN) 5 MG tablet Take 1 tablet 5 mg for 2 days and then STOP Patient not taking: Reported on 08/17/2014 08/05/14   Jodelle Gross, NP  metoprolol (LOPRESSOR) 100 MG tablet Take 1 tablet (100 mg total) by mouth 2 (two) times daily. 07/19/14   Jodelle Gross, NP   Fam HX:    Family History  Problem Relation Age of Onset  . Heart disease Mother   . Early death Father   . Aneurysm Brother   . Aortic aneurysm Brother   . Diabetes Sister    Social HX:    History   Social History  . Marital Status: Widowed    Spouse Name: N/A  . Number of Children: N/A  . Years of Education: N/A   Occupational History  . Retired    Social History Main Topics  . Smoking status: Never Smoker   . Smokeless tobacco: Never Used  . Alcohol Use: No  . Drug Use: No  . Sexual Activity: No   Other Topics Concern  . Not on file   Social History Narrative     ROS:  Denies any bleeding, syncope, orthopnea, PND. All 11 ROS were addressed and are negative except what is stated in the HPI   Physical Exam: Blood pressure 87/60, pulse 120, temperature 97.8 F (36.6 C), temperature source Oral, resp. rate 18, height 5\' 10"  (1.778 m), weight 208 lb 15.9 oz (94.8 kg), SpO2 100 %.   General: Elderly, in no acute distress Head: Eyes PERRLA, No xanthomas.   Normal cephalic and atramatic  Lungs:   Clear bilaterally to auscultation and percussion. Normal respiratory effort. No wheezes, no rales. Heart:   Irregularly irregular, mildly tachycardic Pulses are 2+ & equal. 1/6 systolic left lower sternal border murmur, no rubs, gallops.  No carotid bruit. No JVD.  No abdominal bruits.  Abdomen: Bowel sounds are positive, abdomen soft and non-tender without masses. No hepatosplenomegaly. Msk:  Back normal. Normal strength and tone for age. Extremities:  Chronic appearing lower extremity edema  bilaterally, fairly significant.  DP +1 Neuro: Alert and oriented X 3, non-focal, MAE x 4 GU: Deferred Rectal: Deferred Psych:  Good affect, responds appropriately      Labs: Lab Results  Component Value Date   WBC 7.2 08/22/2014   HGB 9.8* 08/22/2014   HCT 30.6* 08/22/2014   MCV 95.0 08/22/2014   PLT 88* 08/22/2014     Recent  Labs Lab 08/20/14 0406  08/22/14 0543  NA 133*  < > 133*  K 4.1  < > 4.4  CL 105  < > 106  CO2 18*  < > 17*  BUN 125*  < > 121*  CREATININE 4.02*  < > 3.78*  CALCIUM 7.7*  < > 8.0*  PROT 6.0  --   --   BILITOT 1.4*  --   --   ALKPHOS 71  --   --   ALT 14  --   --   AST 16  --   --   GLUCOSE 105*  < > 91  < > = values in this interval not displayed.  Recent Labs  08/22/14 1230  TROPONINI <0.03   Lab Results  Component Value Date   CHOL 118 11/19/2013   HDL 39* 11/19/2013   LDLCALC 61 11/19/2013   TRIG 89 11/19/2013   No results found for: DDIMER   Radiology:  Dg Chest Port 1 View  08/22/2014   CLINICAL DATA:  Shortness of Breath  EXAM: PORTABLE CHEST - 1 VIEW  COMPARISON:  August 21, 2014  FINDINGS: There is cardiomegaly with pulmonary venous hypertension. There is a right pleural effusion. There is interstitial edema throughout the lungs. There is no appreciable airspace consolidation. There is atelectatic change in the medial right base.  IMPRESSION: Findings consistent with congestive heart failure. Atelectasis medial right base, stable. No new opacity.   Electronically Signed   By: Bretta Bang III M.D.   On: 08/22/2014 12:12   Dg Chest Port 1 View  08/21/2014   CLINICAL DATA:  Pleural effusion.  EXAM: PORTABLE CHEST - 1 VIEW  COMPARISON:  08/17/2014.  FINDINGS: Mediastinum hilar structures stable. Prior median sternotomy. Stable cardiomegaly. Mild pulmonary vascular prominence and interstitial prominence suggesting mild congestive heart failure. Right pleural effusion again noted and unchanged. Tiny left pleural effusion cannot be  excluded. No pneumothorax.  IMPRESSION: Findings consistent with mild congestive heart failure with mild pulmonary interstitial edema and stable right pleural effusion. Tiny left pleural effusion cannot be excluded . Prior median sternotomy.   Electronically Signed   By: Maisie Fus  Register   On: 08/21/2014 07:14   Personally viewed.  EKG:  Atrial fibrillation with rapid ventricular response, nonspecific ST-T wave changes Personally viewed.   ASSESSMENT/PLAN:    81 year old with chronic systolic heart failure, ejection fraction 35%, persistent atrial fibrillation previously on chronic anticoagulation with Coumadin, prior persistent hypotension resulting in challenging control of atrial fibrillation, chronic kidney disease-no longer on digoxin.  1. Chronic systolic heart failure-currently appears fairly comfortable from a breathing perspective and is likely euvolemic. No rales on exam. Earlier today he had few crackles in the bases. He does have massive lower extremity edema which has been a challenge to treat. Perhaps Ace wraps/compression would be helpful if okay from a wound standpoint. Unable to effectively give diuretics because of chronic kidney disease as well as hypotension.  2. Atrial fibrillation-persistent-agree with IV amiodarone currently. His heart rate is now in the 100-110 range. I would transition him to by mouth amiodarone, 200 mg twice a day over the next 2 weeks if his heart rate remains stable tomorrow. Go back to 200 mg 100 today after that. Amiodarone is being utilized for rate control purposes. Diltiazem is ineffective because of hypotension. No digoxin because of chronic kidney disease. No Toprol because of hypotension. If he remains hypotensive, may need to pull back on the amiodarone regardless.  3. Chronic anticoagulation-continue with  Coumadin.  4. Massive lower extremity edema-PA pressures of 50 mmHg systolic. This will continue to be challenging to treat. Likely severe  chronic venous insufficiency is playing a role as well. Could try Ace wraps as noted above. Lasix has been challenging because of hypotension.  5. Prognosis-given his cardiomyopathy, massive edema, considerable hypotension I discussed with him that his overall cardio vascular prognosis is poor over the next year. Trying to optimize medical therapy to the best of our ability.  We will follow along.  Donato Schultz, MD  08/22/2014  2:39 PM

## 2014-08-22 NOTE — Progress Notes (Signed)
[facility] PROGRESS NOTE  [name] WUJ:[id number] DOB: 04-23-1926 DOA: 2014/09/17 PCP: Lubertha South, MD  Assessment/Plan: #1 acute respiratory distress Likely secondary to afib with RVR and acute on chronic systolic CHF. Patient noted to be significantly volume overloaded on exam with positive JVD and 3+ lower extremity edema to the hips. Patient also noted to be A. fib with RVR with heart rates in the 130s to the 140s. We'll transfer patient to the stepdown unit. We'll place patient on a Cardizem drip. Unable to diureses at this time as blood pressure is in the high 80s. Consult with critical care.  #2 A. fib with RVR Questionable etiology. Likely secondary to sepsis. Patient on empiric IV antibiotics for right lower extremity cellulitis as well as a Pseudomonas UTI. Cycle cardiac enzymes every 6 hours 3. Patient had a 2-D echo done early on in this hospitalization which showed a EF= 30 to 35% with no wall motion abnormalities. Severe tricuspid regurgitation with a severely dilated right atrium. Right ventricle was severely dilated. Left atrium and was severely dilated. Pulmonary artery systolic pressure was moderately increased with a PA peak pressure of 50 mmHg. IVC was dilated. Will place patient on a Cardizem drip. INR supratherapeutic. When INR is within the therapeutic window Coumadin will be resumed. Patient with borderline blood pressure. Consult cardiology for further evaluation and management.  #3 acute on chronic systolic heart failure Patient noted to be visibly heart failure. However blood pressure is borderline and difficult to diureses. Patient also in atrial fibrillation with RVR. Check a BNP. Cycle cardiac enzymes every 6 hours 3. Patient with recent 2-D echo done on 08/20/2014 which showed a EF of 30-35% with no wall motion abnormalities. Severe tricuspid regurgitation with a severely dilated right atrium and right ventricle and left atrium. Right ventricle is also  severely dilated. Moderately increased PA peak pressure. Patient noted to have significant JVD and significant lower extremity edema to his hips on examination. We'll try to control heart rate. Hold off on diuretics at this time as blood pressure is in the high 80s. Consult with cardiology for further evaluation and management. May need the BiPAP. We'll place on Neo-Synephrine to keep blood pressure over 90.  #4 hypotension Patient noted to be hypotensive with blood pressure in the high 80s. Patient's blood pressure has been borderline this morning. Patient in A. fib with RVR as well as heart failure. Patient has an place on a Cardizem drip for better rate control. Consult with critical care medicine for further evaluation and management.  #5 sepsis Secondary to Pseudomonas UTI and right lower extremity cellulitis. Patient on empiric IV cefepime. Follow.  #6 Pseudomonas UTI On IV cefepime.  #7 right lower extremity cellulitis On IV cefepime. Will add oral doxycycline.  #8 acute on chronic renal failure Likely secondary to cardiorenal syndrome. Patient with a poor ejection fraction and currently in acute heart failure with A. fib with RVR. Patient with borderline blood pressure and a such a difficult to diureses. Per critical care medicine patient had made it clear that he never wants to be on dialysis. Will need to monitor. If no improvement may need a goals of care meeting.  #9 anemia Likely anemia of chronic disease. No overt bleeding. Follow H&H.  #10 Coagulopathy Patient's INR was greater than 10 on admission. INR today is 4.05. Hold Coumadin dose today.  #11 thrombocytopenia Likely secondary to acute infection. No overt bleeding. Follow H&H.  #12 right lower extremity wound Wound care consult.  #  13 constipation Senokot-S.  #14 prophylaxis INR supratherapeutic at 4.05. Once INR becomes therapeutic Coumadin will be resumed per pharmacy for DVT prophylaxis.  Code Status:  Partial: Family Communication: Updated patient. No family present. Disposition Plan: Transfer to stepdown unit.   Consultants:  PCCM admit  Procedures:  2-D echo 08/20/2014  Chest x-ray 08/04/2014, 08/19/2014, 08/20/2014, 08/21/2014  X-ray of the hip 08/19/2014  X-ray of the tib-fib 08/19/2014 neg for acute fx, mild knee/ankle joint osteo, extensive peripheral vascular calcification, focal soft tissue swelling  Antibiotics:  IV cefepime 08/21/2014   IV Zosyn 08/14/2014>>>> 08/21/2014  IV vancomycin 08/12/2014>>>> 08/21/2014  Oral doxycycline 08/22/2014  HPI/Subjective: Patient states Im not feeling well. Patient denies CP. No SOB, despite using acessory muscles of respiration and not speaking in full sentences.  Objective: Filed Vitals:   08/22/14 1018  BP: 97/71  Pulse:   Temp:   Resp:     Intake/Output Summary (Last 24 hours) at 08/22/14 1129 Last data filed at 08/22/14 0300  Gross per 24 hour  Intake 429.67 ml  Output    850 ml  Net -420.33 ml   Filed Weights   08/19/14 0223  Weight: 86.4 kg (190 lb 7.6 oz)    Exam:   General:  Using accessory muscles of respiration.  Cardiovascular: Irregularly irregular. +JVD  Respiratory: Bibasilar crackles. Using accessory muscles of respiration.  Abdomen: Soft/NT/nd/+bs. NEPHROSTOMY TUBE.  Musculoskeletal: No c/c/. 3-4+ BLE edema to the hips. RLE with erythema.  Data Reviewed: Basic Metabolic Panel:  Recent Labs Lab 07/29/2014 2058 08/19/14 0745 08/20/14 0406 08/21/14 0350 08/22/14 0543  NA 134* 132* 133* 134* 133*  K 4.5 4.3 4.1 4.0 4.4  CL 104 106 105 106 106  CO2 16* 16* 18* 18* 17*  GLUCOSE 102* 107* 105* 113* 91  BUN 141* 127* 125* 110* 121*  CREATININE 4.44* 4.11* 4.02* 3.91* 3.78*  CALCIUM 8.0* 7.5* 7.7* 7.8* 8.0*   Liver Function Tests:  Recent Labs Lab 08/20/14 0406  AST 16  ALT 14  ALKPHOS 71  BILITOT 1.4*  PROT 6.0  ALBUMIN 2.7*   No results for input(s): LIPASE, AMYLASE  in the last 168 hours. No results for input(s): AMMONIA in the last 168 hours. CBC:  Recent Labs Lab 08/24/2014 2058 08/19/14 0745 08/20/14 0406 08/21/14 0350 08/22/14 0543  WBC 11.5* 16.6* 15.4* 8.7 7.2  NEUTROABS 10.3*  --   --   --   --   HGB 11.3* 11.1* 10.6* 9.9* 9.8*  HCT 34.6* 33.7* 32.3* 29.7* 30.6*  MCV 96.1 94.9 94.4 94.9 95.0  PLT 107* 132* 127* 87* 88*   Cardiac Enzymes:  Recent Labs Lab 08/19/14 0745  CKTOTAL 87   BNP (last 3 results)  Recent Labs  08/02/2014 2058 08/20/14 0925 08/21/14 0350  BNP 694.0* 933.0* 430.2*    ProBNP (last 3 results)  Recent Labs  04/09/14 1038  PROBNP 9656.0*    CBG:  Recent Labs Lab 08/20/14 1225  GLUCAP 143*    Recent Results (from the past 240 hour(s))  Blood Culture (routine x 2)     Status: None (Preliminary result)   Collection Time: 08/17/2014  8:58 PM  Result Value Ref Range Status   Specimen Description BLOOD LEFT HAND  Final   Special Requests BOTTLES DRAWN AEROBIC ONLY 7CC  Final   Culture NO GROWTH 4 DAYS  Final   Report Status PENDING  Incomplete  Blood Culture (routine x 2)     Status: None (Preliminary result)   Collection  Time: 08-Sep-2014  9:05 PM  Result Value Ref Range Status   Specimen Description BLOOD LEFT HAND  Final   Special Requests BOTTLES DRAWN AEROBIC ONLY 7CC  Final   Culture NO GROWTH 4 DAYS  Final   Report Status PENDING  Incomplete  Urine culture     Status: None   Collection Time: 09-08-14 11:33 PM  Result Value Ref Range Status   Specimen Description URINE, CATHETERIZED  Final   Special Requests NONE  Final   Colony Count   Final    >=100,000 COLONIES/ML Performed at [facility]    Culture   Final    PSEUDOMONAS AERUGINOSA Performed at [facility]    Report Status 08/21/2014 FINAL  Final   Organism ID, Bacteria PSEUDOMONAS AERUGINOSA  Final      Susceptibility   Pseudomonas aeruginosa - MIC*    CEFEPIME 4 SENSITIVE Sensitive     CEFTAZIDIME 16  INTERMEDIATE Intermediate     CIPROFLOXACIN <=0.25 SENSITIVE Sensitive     GENTAMICIN <=1 SENSITIVE Sensitive     IMIPENEM 1 SENSITIVE Sensitive     TOBRAMYCIN <=1 SENSITIVE Sensitive     * PSEUDOMONAS AERUGINOSA  Urine culture     Status: None   Collection Time: 08/19/14  2:48 AM  Result Value Ref Range Status   Specimen Description URINE, SUPRAPUBIC  Final   Special Requests NONE  Final   Colony Count   Final    >=100,000 COLONIES/ML Performed at [facility]    Culture   Final    PSEUDOMONAS AERUGINOSA Performed at [facility]    Report Status 08/21/2014 FINAL  Final   Organism ID, Bacteria PSEUDOMONAS AERUGINOSA  Final      Susceptibility   Pseudomonas aeruginosa - MIC*    CEFEPIME 8 SENSITIVE Sensitive     CEFTAZIDIME 16 INTERMEDIATE Intermediate     CIPROFLOXACIN <=0.25 SENSITIVE Sensitive     GENTAMICIN <=1 SENSITIVE Sensitive     IMIPENEM 1 SENSITIVE Sensitive     TOBRAMYCIN <=1 SENSITIVE Sensitive     * PSEUDOMONAS AERUGINOSA  MRSA PCR Screening     Status: None   Collection Time: 08/19/14  3:11 AM  Result Value Ref Range Status   MRSA by PCR NEGATIVE NEGATIVE Final    Comment:        The GeneXpert MRSA Assay (FDA approved for NASAL specimens only), is one component of a comprehensive MRSA colonization surveillance program. It is not intended to diagnose MRSA infection nor to guide or monitor treatment for MRSA infections.      Studies: Dg Chest Port 1 View  08/21/2014   CLINICAL DATA:  Pleural effusion.  EXAM: PORTABLE CHEST - 1 VIEW  COMPARISON:  09/08/2014.  FINDINGS: Mediastinum hilar structures stable. Prior median sternotomy. Stable cardiomegaly. Mild pulmonary vascular prominence and interstitial prominence suggesting mild congestive heart failure. Right pleural effusion again noted and unchanged. Tiny left pleural effusion cannot be excluded. No pneumothorax.  IMPRESSION: Findings consistent with mild congestive heart failure with  mild pulmonary interstitial edema and stable right pleural effusion. Tiny left pleural effusion cannot be excluded . Prior median sternotomy.   Electronically Signed   By: Maisie Fus  Register   On: 08/21/2014 07:14    Scheduled Meds: . amiodarone  200 mg Oral BID  . ceFEPime (MAXIPIME) IV  1 g Intravenous Q24H  . diltiazem  5 mg Intravenous Once  . latanoprost  1 drop Both Eyes QHS  . simvastatin  20 mg  Oral Daily  . vitamin B-12  1,000 mcg Oral Daily  . Warfarin - Pharmacist Dosing Inpatient   Does not apply q1800   Continuous Infusions: . sodium chloride 10 mL/hr at 08/21/14 1030  . diltiazem (CARDIZEM) infusion      Principal Problem:   Acute respiratory failure Active Problems:   Acute on chronic systolic heart failure   A-fib   Chronic kidney disease, stage III (moderate)   Sepsis   Acute on chronic renal failure   Essential hypertension   Malnutrition of moderate degree   Cellulitis of leg, right   UTI (lower urinary tract infection)    Time spent: 40 MINS    [facility] MD [facility] Pager [phone]. If 7PM-7AM, please contact night-coverage at [email or web address], password [facility] 08/22/2014, 11:29 AM  LOS: 4 days

## 2014-08-22 NOTE — Care Management Note (Addendum)
    Page 1 of 1   08/26/2014     12:20:19 PM CARE MANAGEMENT NOTE 08/26/2014  Patient:  [name], [name]   Account Number:  [id number]  Date Initiated:  08/19/2014  Documentation initiated by:  Anett Ranker  Subjective/Objective Assessment:   sepsis in 81 year old male     Action/Plan:   from home   Anticipated DC Date:  08/29/2014   Anticipated DC Plan:  HOME W HOME HEALTH SERVICES  In-house referral  NA      DC Planning Services  CM consult      Choice offered to / List presented to:             Status of service:  In process, will continue to follow Medicare Important Message given?   (If response is "NO", the following Medicare IM given date fields will be blank) Date Medicare IM given:   Medicare IM given by:   Date Additional Medicare IM given:   Additional Medicare IM given by:    Discharge Disposition:    Per UR Regulation:  Reviewed for med. necessity/level of care/duration of stay  If discussed at Long Length of Stay Meetings, dates discussed:    Comments:  Aug 26, 2014/Kate Larock L. Earlene Plater, RN, BSN, CCM. Case Management [employer] [phone] No discharge needs present of time of review.   08/22/14 Lanier Clam RN BSN NCM [phone] afib w/rvr.for transfer to SDU.[facility] following. Noted CSW following but has spoken to patient & currently he declines SNF.SDU CM notified.  August 19, 2014/Ravin Denardo L. Earlene Plater, RN, BSN, CCM. Case Management [employer] [phone] No discharge needs present of time of review.

## 2014-08-22 NOTE — Progress Notes (Signed)
ANTICOAGULATION CONSULT NOTE - Follow up  Pharmacy Consult for warfarin Indication: atrial fibrillation  Allergies  Allergen Reactions  . Sulfa Antibiotics Rash    Nausea    Patient Measurements: Height: 5\' 10"  (177.8 cm) Weight: 190 lb 7.6 oz (86.4 kg) IBW/kg (Calculated) : 73  Vital Signs: Temp: 97.8 F (36.6 C) (04/28 0457) Temp Source: Oral (04/28 0457) BP: 93/56 mmHg (04/28 0457) Pulse Rate: 120 (04/28 0457)  Labs:  Recent Labs  08/20/14 0406 08/21/14 0350 08/22/14 0543  HGB 10.6* 9.9* 9.8*  HCT 32.3* 29.7* 30.6*  PLT 127* 87* 88*  LABPROT 22.8* 32.3* 39.7*  INR 2.00* 3.11* 4.05*  CREATININE 4.02* 3.91* 3.78*    Estimated Creatinine Clearance: 14.5 mL/min (by C-G formula based on Cr of 3.78).   Medications:  Scheduled:  . amiodarone  200 mg Oral BID  . ceFEPime (MAXIPIME) IV  1 g Intravenous Q24H  . latanoprost  1 drop Both Eyes QHS  . simvastatin  20 mg Oral Daily  . vitamin B-12  1,000 mcg Oral Daily  . Warfarin - Pharmacist Dosing Inpatient   Does not apply q1800    Assessment: 81yo M presented to [facility] w/ leg swelling, suspected cellulitis, elev SCr, and supratherapeutic INR. Pharmacy was consulted to dose warfarin. Recent course of Vantin could have contributed to supratherapeutic INR. Denies significant changes in dietary intake. I confirmed his most recent warfarin regimen with him: 2.5mg  daily except nothing on Wednesdays.  Significant events: 4/25: INR improved to 3.12 after vit.K 10mg  and FFP.  4/26: INR dropped to 2. Gave warf 2.5mg . Started amiodarone. 4/27: INR jumped back up, supratherapeutic  Today, 08/22/2014:  INR increasing and supratherapeutic  H/H and platelets have trended down but stable since yesterday. No bleeding reported/documented.  Some intake of diet.  Could be more sensitive d/t broad-spectrum abx but also less sensitive for the next few days d/t Vit.K 10mg .   Amiodarone will increase sensitivity to warfarin as it  reaches steady-state over the next couple weeks.   Goal of Therapy:  INR 2-3 Monitor platelets by anticoagulation protocol: Yes   Plan:  No warfarin today.  Check PT/INR daily.  Clance Boll, PharmD, BCPS Pager: [phone] 08/22/2014 8:26 AM

## 2014-08-23 ENCOUNTER — Inpatient Hospital Stay (HOSPITAL_COMMUNITY): Payer: Medicare Other

## 2014-08-23 DIAGNOSIS — I9589 Other hypotension: Secondary | ICD-10-CM

## 2014-08-23 LAB — CBC WITH DIFFERENTIAL/PLATELET
BASOS ABS: 0 10*3/uL (ref 0.0–0.1)
BASOS PCT: 0 % (ref 0–1)
EOS ABS: 0.1 10*3/uL (ref 0.0–0.7)
EOS PCT: 2 % (ref 0–5)
HEMATOCRIT: 28.8 % — AB (ref 39.0–52.0)
HEMOGLOBIN: 9.4 g/dL — AB (ref 13.0–17.0)
LYMPHS ABS: 0.4 10*3/uL — AB (ref 0.7–4.0)
Lymphocytes Relative: 5 % — ABNORMAL LOW (ref 12–46)
MCH: 30.7 pg (ref 26.0–34.0)
MCHC: 32.6 g/dL (ref 30.0–36.0)
MCV: 94.1 fL (ref 78.0–100.0)
MONO ABS: 0.7 10*3/uL (ref 0.1–1.0)
MONOS PCT: 10 % (ref 3–12)
NEUTROS PCT: 83 % — AB (ref 43–77)
Neutro Abs: 6.1 10*3/uL (ref 1.7–7.7)
Platelets: 101 10*3/uL — ABNORMAL LOW (ref 150–400)
RBC: 3.06 MIL/uL — ABNORMAL LOW (ref 4.22–5.81)
RDW: 19.8 % — ABNORMAL HIGH (ref 11.5–15.5)
WBC: 7.3 10*3/uL (ref 4.0–10.5)

## 2014-08-23 LAB — BASIC METABOLIC PANEL
Anion gap: 7 (ref 5–15)
BUN: 111 mg/dL — AB (ref 6–23)
CO2: 18 mmol/L — AB (ref 19–32)
Calcium: 7.7 mg/dL — ABNORMAL LOW (ref 8.4–10.5)
Chloride: 107 mmol/L (ref 96–112)
Creatinine, Ser: 3.84 mg/dL — ABNORMAL HIGH (ref 0.50–1.35)
GFR calc Af Amer: 15 mL/min — ABNORMAL LOW (ref >90)
GFR calc non Af Amer: 13 mL/min — ABNORMAL LOW (ref >90)
Glucose, Bld: 111 mg/dL — ABNORMAL HIGH (ref 70–99)
Potassium: 4.3 mmol/L (ref 3.5–5.1)
SODIUM: 132 mmol/L — AB (ref 135–145)

## 2014-08-23 LAB — PROTIME-INR
INR: 4.48 — AB (ref 0.00–1.49)
PROTHROMBIN TIME: 42.9 s — AB (ref 11.6–15.2)

## 2014-08-23 LAB — TROPONIN I: Troponin I: 0.03 ng/mL (ref <0.031)

## 2014-08-23 LAB — BRAIN NATRIURETIC PEPTIDE: B Natriuretic Peptide: 1201.9 pg/mL — ABNORMAL HIGH (ref 0.0–100.0)

## 2014-08-23 MED ORDER — FUROSEMIDE 10 MG/ML IJ SOLN
40.0000 mg | Freq: Once | INTRAMUSCULAR | Status: AC
  Administered 2014-08-23: 40 mg via INTRAVENOUS
  Filled 2014-08-23: qty 4

## 2014-08-23 MED ORDER — COLLAGENASE 250 UNIT/GM EX OINT
TOPICAL_OINTMENT | Freq: Every day | CUTANEOUS | Status: DC
  Administered 2014-08-23 – 2014-08-31 (×9): via TOPICAL
  Filled 2014-08-23: qty 30

## 2014-08-23 NOTE — Progress Notes (Signed)
Patient Name: [name] Date of Encounter: 08/23/2014  Principal Problem:   Acute respiratory failure Active Problems:   Chronic kidney disease, stage III (moderate)   Sepsis   Acute on chronic renal failure   Essential hypertension   Malnutrition of moderate degree   A-fib   Acute on chronic systolic heart failure   Cellulitis of leg, right   UTI (lower urinary tract infection)   Arterial hypotension   Cardiogenic shock   ADHF (acute decompensated heart failure)   Length of Stay: 5  SUBJECTIVE  The patient denies SOB or CP.   CURRENT MEDS . ceFEPime (MAXIPIME) IV  1 g Intravenous Q24H  . doxycycline  100 mg Oral Q12H  . latanoprost  1 drop Both Eyes QHS  . vitamin B-12  1,000 mcg Oral Daily  . Warfarin - Pharmacist Dosing Inpatient   Does not apply q1800    OBJECTIVE  Filed Vitals:   08/23/14 0400 08/23/14 0438 08/23/14 0500 08/23/14 0530  BP: 83/68 90/52 102/47 92/63  Pulse: 108 90 120 81  Temp:      TempSrc:      Resp: 23 25 24 20   Height:      Weight:      SpO2: 96% 89% 98% 98%    Intake/Output Summary (Last 24 hours) at 08/23/14 0742 Last data filed at 08/23/14 0600  Gross per 24 hour  Intake 863.96 ml  Output    825 ml  Net  38.96 ml   Filed Weights   08/19/14 0223 08/22/14 1324  Weight: 190 lb 7.6 oz (86.4 kg) 208 lb 15.9 oz (94.8 kg)    PHYSICAL EXAM  General: Pleasant, NAD. Neuro: Alert and oriented X 3. Moves all extremities spontaneously. Psych: Normal affect. HEENT:  Normal  Neck: Supple without bruits or JVD. Lungs:  Resp regular and unlabored, CTA. Heart: RRR no s3, s4, or murmurs. Abdomen: Soft, non-tender, non-distended, BS + x 4.  Extremities: No clubbing, cyanosis or edema. DP/PT/Radials 2+ and equal bilaterally.  Accessory Clinical Findings  CBC  Recent Labs  08/22/14 0543 08/23/14 0522  WBC 7.2 7.3  NEUTROABS  --  6.1  HGB 9.8* 9.4*  HCT 30.6* 28.8*  MCV 95.0 94.1  PLT 88* 101*   Basic Metabolic  Panel  Recent Labs  59/93/57 0543 08/23/14 0522  NA 133* 132*  K 4.4 4.3  CL 106 107  CO2 17* 18*  GLUCOSE 91 111*  BUN 121* 111*  CREATININE 3.78* 3.84*  CALCIUM 8.0* 7.7*   Cardiac Enzymes  Recent Labs  08/22/14 1230 08/22/14 1724 08/22/14 2320  TROPONINI <0.03 0.03 0.03   Radiology/Studies  Dg Chest Port 1 View  08/22/2014   CLINICAL DATA:  Shortness of Breath  EXAM: PORTABLE CHEST - 1 VIEW  COMPARISON:  August 21, 2014  FINDINGS: There is cardiomegaly with pulmonary venous hypertension. There is a right pleural effusion. There is interstitial edema throughout the lungs. There is no appreciable airspace consolidation. There is atelectatic change in the medial right base.  IMPRESSION: Findings consistent with congestive heart failure. Atelectasis medial right base, stable. No new opacity.   TELE: a-fib with RVR     ASSESSMENT AND PLAN  81 year old with chronic systolic heart failure, ejection fraction 35%, persistent atrial fibrillation previously on chronic anticoagulation with Coumadin, prior persistent hypotension resulting in challenging control of atrial fibrillation, chronic kidney disease-no longer on digoxin.  1. Chronic systolic heart failure-appears mildly fluid overloaded, however hypotensive, septic with with  CKD stage 3, on ATB, I would introduce small doses of iv Lasix once his BP is improved.  He does have massive lower extremity edema which has been a challenge to treat. Perhaps Ace wraps/compression would be helpful if okay from a wound standpoint.   2. Atrial fibrillation-persistent- continue amiodarone drip as he continues being tachycardic, I would transition him to by mouth amiodarone, 200 mg twice a day over the next 2 weeks if his heart rate remains stable tomorrow. Go back to 200 mg 100 today after that. Amiodarone is being utilized for rate control purposes. Diltiazem is ineffective because of hypotension. No digoxin because of chronic kidney disease.  No Toprol because of hypotension.  3. Chronic anticoagulation-continue with Coumadin.  4. Massive lower extremity edema-PA pressures of 50 mmHg systolic. This will continue to be challenging to treat. Likely severe chronic venous insufficiency is playing a role as well. Could try Ace wraps as noted above. Lasix has been challenging because of hypotension.  5. Prognosis-given his cardiomyopathy, massive edema, considerable hypotension I discussed with him that his overall cardio vascular prognosis is poor over the next year. Trying to optimize medical therapy to the best of our ability.    Signed, Lars Masson MD, [facility] 08/23/2014

## 2014-08-23 NOTE — Consult Note (Signed)
WOC wound consult note Reason for Consult:Cellulitis to right lower extremity, resolving.  Lesion to right lateral lower leg.  100% devitalized tissue in wound bed.  Wound type: Trauma  Measurement: 4 cm x 2 cm wound bed is 100% eschar, separated from wound margins, boggy and fluctuant Wound bed: 100% eschar Drainage (amount, consistency, odor) Moderate serosanguinous drainage.  No odor.  Periwound: Erythema to bilateral lower legs.  Patient states he never wraps his legs unless they get very swollen or are weeping.  Dressing procedure/placement/frequency: Cleanse right lateral lower leg with NS and pat gently dry.  Apply Santyl ointment, 1/8 inch thickness (opaque).  Top with NS moist dressing.  Cover with 4x4 gauze and secure with kerlix and tape. Change daily.  Will not follow at this time.  Please re-consult if needed.  Maple Hudson RN BSN [facility] Pager [phone]

## 2014-08-23 NOTE — Progress Notes (Signed)
Physical Therapy Treatment Patient Details Name: LY STENCIL MRN: [id number] DOB: 1925-05-24 Today's Date: 08/23/2014    History of Present Illness 81 yo male admitted with sepsis. hx of recent fall with R ankle sprain, afib, chf, pad.     PT Comments    Pt already OOB in recliner.  Assisted with amb using B platform EVA walker due to weakness.  Assisted back to bed per X ray tech.    Follow Up Recommendations  SNF     Equipment Recommendations  Rolling walker with 5" wheels    Recommendations for Other Services       Precautions / Restrictions Precautions Precautions: Fall Restrictions Weight Bearing Restrictions: No    Mobility  Bed Mobility Overal bed mobility: Needs Assistance Bed Mobility: Sit to Supine       Sit to supine: Max assist   General bed mobility comments: assist back to bed  Transfers Overall transfer level: Needs assistance Equipment used: Rolling walker (2 wheeled) Transfers: Sit to/from Stand Sit to Stand: Mod assist;+2 physical assistance;+2 safety/equipment         General transfer comment: Assist to rise, stabilize, control descent. VCs safety, technique, hand/feet placement  Ambulation/Gait Ambulation/Gait assistance: Min assist;Mod assist;+2 safety/equipment;+2 physical assistance Ambulation Distance (Feet): 45 Feet Assistive device: Bilateral platform walker (EVA walker) Gait Pattern/deviations: Step-to pattern;Step-through pattern;Trunk flexed Gait velocity: decreased   General Gait Details: Used EVA walker for increased safety and fact that pt has not amb for a while.    Stairs            Wheelchair Mobility    Modified Rankin (Stroke Patients Only)       Balance                                    Cognition Arousal/Alertness: Awake/alert Behavior During Therapy: WFL for tasks assessed/performed Overall Cognitive Status: Within Functional Limits for tasks assessed                       Exercises      General Comments        Pertinent Vitals/Pain Pain Assessment: 0-10 Pain Score: 3  Pain Location: B LE feet Pain Descriptors / Indicators: Burning;Sore Pain Intervention(s): Monitored during session;Repositioned    Home Living                      Prior Function            PT Goals (current goals can now be found in the care plan section)      Frequency  Min 3X/week    PT Plan      Co-evaluation             End of Session Equipment Utilized During Treatment: Gait belt Activity Tolerance: Patient limited by fatigue       Time: 1105-1130 PT Time Calculation (min) (ACUTE ONLY): 25 min  Charges:  $Gait Training: 8-22 mins $Therapeutic Activity: 8-22 mins                    G Codes:      Felecia Shelling  PTA [facility]  Acute  Rehab Pager      [phone]

## 2014-08-23 NOTE — Progress Notes (Signed)
Contacted Dr. Onalee Hua about pt soft BP and Amio drip. Per Dr. Onalee Hua, while on Amio Drip, MAP > 60, as long as the pt  asymptomatic, and urine output WNL. It is ok to maintain Amio drip within these limits.    Caralee Ates, RN

## 2014-08-23 NOTE — Progress Notes (Signed)
PULMONARY / CRITICAL CARE MEDICINE   Name: [name] MRN: [id number] DOB: [birth year]    ADMISSION DATE:  09/03/14  REFERRING MD :  Jeani Hawking EDP  CHIEF COMPLAINT:  Sepsis   INITIAL PRESENTATION: 81 y/o male with extensive PMH including Afib on coumadin, CKD III, chronic sCHF (EF 35%), PAD presented 4/24 to [facility] ED with c/o RLE wound with concern for ischemia.  Also found to have acute on CKD, elevated lactate and borderline BP and was tx to [facility] to PCCM with ?sepsis.  Of note pt has been clear in the past that he would never want dialysis.   STUDIES:  CXR 4/24 >> unchanged persistent/chronic R pleural effusion. Hip XR 4/25 >> neg Tib/Fib XR 4/25 >> neg for acute fx, mild knee/ankle joint osteo, extensive peripheral vascular calcification, focal soft tissue swelling  ECHO 4/26 >> mod LV dilation, systolic function severely reduced, EF 30-35%, no RWMA, mod AR, mod MR, RA severely dilated, PA peak 50  SIGNIFICANT EVENTS: 4/24  Admit with hypotension, BLE pain, acute on chronic CKD, elevated lactate, hypotension  4/25  Remains on 10 mcg levo, BP in 70's with excellent mental status 4/26  Neosynephrine gtt 4/27  Weaned off neo, up to chair 4/28 back to ICU hypotension, afib   SUBJECTIVE:  Slept OK last night, wants to get out of bed  VITAL SIGNS: Temp:  [97.4 F (36.3 C)-98 F (36.7 C)] 97.4 F (36.3 C) (04/29 0800) Pulse Rate:  [39-159] 81 (04/29 0530) Resp:  [15-25] 20 (04/29 0530) BP: (77-122)/(45-101) 92/63 mmHg (04/29 0530) SpO2:  [89 %-100 %] 98 % (04/29 0530) Weight:  [208 lb 15.9 oz (94.8 kg)] 208 lb 15.9 oz (94.8 kg) (04/28 1324)   HEMODYNAMICS:     VENTILATOR SETTINGS:     INTAKE / OUTPUT:  Intake/Output Summary (Last 24 hours) at 08/23/14 0952 Last data filed at 08/23/14 0600  Gross per 24 hour  Intake 863.96 ml  Output    825 ml  Net  38.96 ml    PHYSICAL EXAMINATION: General:  Sitting up in bed, no distress HENT: NCAT EOMi Eyes:  EOMi, anicteric PULM: Few crackles bases, normal effort CV: Irreg irreg, JVD elevated again GI: BS+, soft Ext: massive edema slightly improved Derm: redness in legs improved, hematoma dressed  LABS:  CBC  Recent Labs Lab 08/21/14 0350 08/22/14 0543 08/23/14 0522  WBC 8.7 7.2 7.3  HGB 9.9* 9.8* 9.4*  HCT 29.7* 30.6* 28.8*  PLT 87* 88* 101*   Coag's  Recent Labs Lab 08/21/14 0350 08/22/14 0543 08/23/14 0522  INR 3.11* 4.05* 4.48*   BMET  Recent Labs Lab 08/21/14 0350 08/22/14 0543 08/23/14 0522  NA 134* 133* 132*  K 4.0 4.4 4.3  CL 106 106 107  CO2 18* 17* 18*  BUN 110* 121* 111*  CREATININE 3.91* 3.78* 3.84*  GLUCOSE 113* 91 111*   Electrolytes  Recent Labs Lab 08/21/14 0350 08/22/14 0543 08/23/14 0522  CALCIUM 7.8* 8.0* 7.7*   Sepsis Markers  Recent Labs Lab 2014/09/03 2123 08/19/14 1155 08/22/14 1200  LATICACIDVEN 2.27* 2.0 1.4   Liver Enzymes  Recent Labs Lab 08/20/14 0406  AST 16  ALT 14  ALKPHOS 71  BILITOT 1.4*  ALBUMIN 2.7*   Glucose  Recent Labs Lab 08/20/14 1225  GLUCAP 143*    Imaging Dg Chest Port 1 View  08/22/2014   CLINICAL DATA:  Shortness of Breath  EXAM: PORTABLE CHEST - 1 VIEW  COMPARISON:  August 21, 2014  FINDINGS: There is cardiomegaly with pulmonary venous hypertension. There is a right pleural effusion. There is interstitial edema throughout the lungs. There is no appreciable airspace consolidation. There is atelectatic change in the medial right base.  IMPRESSION: Findings consistent with congestive heart failure. Atelectasis medial right base, stable. No new opacity.   Electronically Signed   By: Bretta Bang III M.D.   On: 08/22/2014 12:12     ASSESSMENT / PLAN:  Chronic bilateral effusions due to CHF Acute pulmonary edema due to acute decompensated heart failure > stable P: Will give one dose IV lasix now Supplemental O2 as needed for sats > 90% Pulmonary hygiene: IS, mobilize  Severe sepsis>  resolved Shock 4/28 AM> likely cardiogenic complicated by diltiazem, improved don't think this is sepsis Acute systolic heart failure EF 30-35%  AFib with RVR PVD P:  Appreciate cardiology Continue IV amiodarone No dilt  Acute on CKD III - baseline Scr ~2.6, stable P:   Pt would NOT want dialysis  Monitor BMET and UOP Replace electrolytes as needed  Coagulopathy due to warfarin - INR had improved pos  Thrombocytopenia - S/p FFP, Vit K  P:  Trend CBC, Coags Coumadin per pharmacy  ID Hypotension on 4/28 > think this is cardiogenic as his WBC is down, his is afebrile BLE Cellulitis > improved RLE Wound - injured with leg bag Pseudomonas UTI  P:   BCx2 4/24 >> UC 4/24 >> pseudomonas aeruginosa >> intermediate to ceftazidime, otherwise sensitive   Vancomycin 4/25>>>4/27 Zosyn 4/25>>>4/27 Cefepime 4/27 >> Doxycycline 4/28 >>  Would treat UTI 7 days, have placed stop date on zosyn Doxycycline per [facility] Wound care consult placed for wraps and attention to RLE hematoma/wound  Neuro Deconditioning   P: PT consult for deconditioning, hx of falls  Activity as tolerated   CODE STATUS DNR, see note from 4/28  Family : updated sister on 4/28 by phone  Today's summary: Improved BP, still in Afib with RVR; will give lasix, set stop dates on Cefepime, PCCM to sign off call if questions    Heber Armington, MD [facility] Pager: [phone] Cell: [phone] If no response, call [phone]

## 2014-08-23 NOTE — Progress Notes (Signed)
ANTICOAGULATION CONSULT NOTE - Follow up  Pharmacy Consult for warfarin Indication: atrial fibrillation  Allergies  Allergen Reactions  . Sulfa Antibiotics Rash    Nausea    Patient Measurements: Height: 5\' 10"  (177.8 cm) Weight: 208 lb 15.9 oz (94.8 kg) IBW/kg (Calculated) : 73  Vital Signs: Temp: 97.4 F (36.3 C) (04/29 0800) Temp Source: Oral (04/29 0800) BP: 92/63 mmHg (04/29 0530) Pulse Rate: 81 (04/29 0530)  Labs:  Recent Labs  08/21/14 0350 08/22/14 0543 08/22/14 1230 08/22/14 1724 08/22/14 2320 08/23/14 0522  HGB 9.9* 9.8*  --   --   --  9.4*  HCT 29.7* 30.6*  --   --   --  28.8*  PLT 87* 88*  --   --   --  101*  LABPROT 32.3* 39.7*  --   --   --  42.9*  INR 3.11* 4.05*  --   --   --  4.48*  CREATININE 3.91* 3.78*  --   --   --  3.84*  TROPONINI  --   --  <0.03 0.03 0.03  --    Estimated Creatinine Clearance: 16 mL/min (by C-G formula based on Cr of 3.84).  Medications:  Scheduled:  . ceFEPime (MAXIPIME) IV  1 g Intravenous Q24H  . furosemide  40 mg Intravenous Once  . latanoprost  1 drop Both Eyes QHS  . vitamin B-12  1,000 mcg Oral Daily  . Warfarin - Pharmacist Dosing Inpatient   Does not apply q1800   Assessment: 81yo M presented to [facility] w/ leg swelling, suspected cellulitis, elev SCr, and supratherapeutic INR. Pharmacy was consulted to dose warfarin. Recent course of Vantin could have contributed to supratherapeutic INR. Denies significant changes in dietary intake. I confirmed his most recent warfarin regimen with him: 2.5mg  daily except nothing on Wednesdays.  Significant events: 4/25: INR improved to 3.12 after vit.K 10mg  and FFP.  4/26: INR dropped to 2. Gave warf 2.5mg . Started amiodarone. 4/27: INR back up, supratherapeutic 4/28: INR increased further  Today, 08/23/2014:  INR increasing and supratherapeutic  H/H and platelets low, stable. No bleeding reported/documented.  Some po intake, on Heart Healthy diet.  Amiodarone will  increase sensitivity to warfarin as it reaches steady-state over the next couple of weeks.   Goal of Therapy:  INR 2-3 Monitor platelets by anticoagulation protocol: Yes   Plan:  No warfarin today.  Check PT/INR daily.  Otho Bellows PharmD Pager [phone] 08/23/2014, 10:30 AM

## 2014-08-23 NOTE — Progress Notes (Signed)
[facility] PROGRESS NOTE  [name] WJX:[id number] DOB: 05/16/1925 DOA: 08/20/2014 PCP: Lubertha South, MD   Brief narrative: 81 y/o ? Chr A.fib CHAD2 score 3 +prio AC now off 2/2 to Melena, Stg III, Post-obs Uropathy, Chr Systolic HF 35-40%, OPrior L Hydronephrosis, prior nephrolithiasis 1980'sm, repair thoracic aneurysm 1995 and 1996 admitted by PCCM on 4/25/16with Acute respiratory failure 2/2 to R pleural effusion + SIRS/Sepsis +m coagulopathy He was seen by Cardiology as well and found to have Chr Systolic HF with new-onset Afib tjhis admission He has been diuresed per Cardiology  Assessment/Plan: #1 acute respiratory distress Likely secondary to afib with RVR and acute on chronic systolic CHF.  significantly volume overloaded/anasarca Patient also noted to be A. fib with RVR with heart rates in the 130s to the 140s.  Cardizem drip-->amiodarone gtt by cardiology Unable to aggressively diureses at this time as blood pressure is in the high 80s and not a gr8 candidate for Ionotropes  #2 A. fib with RVR Likely secondary to sepsis. Patient on empiric IV antibiotics forPseudomonas UTI.  Cycle cardiac enzymes every 6 hours 3.  Patient had a 2-D echo done early on in this hospitalization which showed a EF= 30 to 35% with no wall motion abnormalities.  When INR is within the therapeutic window Coumadin will be resumed per PharmD Appreciate Cardiology opinion  #3 acute on chronic systolic heart failure Patient noted to be visibly heart failure. However blood pressure is borderline and difficult to diureses. Patient also in atrial fibrillation with RVR. Check a BNP. Cycle cardiac enzymes every 6 hours 3. Patient with recent 2-D echo done on 08/20/2014 which showed a EF of 30-35% with no wall motion abnormalities. We'll try to control heart rate. Hold off on diuretics at this time as blood pressure is in the high 80s.  I/o cumulative= +4.46 liters  #4 hypotension Patient noted to  be hypotensive with blood pressure in the high 80s.  Patient's blood pressure has been borderline this morning.  Patient in A. fib with RVR as well as heart failure.   #5 sepsis Secondary to Pseudomonas UTI and right lower extremity cellulitis. Patient on empiric IV cefepime. Follow.  #6 Pseudomonas UTI On IV cefepime--Stop date placed  #7 right lower extremity cellulitis On IV cefepime.  D/c Doxy as wound to leg doesn;t appear infected at this time  #8 acute on chronic renal failure Likely secondary to cardiorenal syndrome. Patient with a poor ejection fraction and currently in acute heart failure with A. fib with RVR.  Get Goals of care meeting by 4/51 if no improvmeent  #9 anemia Likely anemia of chronic disease. No overt bleeding. Follow H&H.  #10 Coagulopathy Patient's INR was greater than 10 on admission. INR today is 4.05. Hold Coumadin dose today.  #11 thrombocytopenia Likely secondary to acute infection. No overt bleeding. Follow H&H.  #12 right lower extremity wound Wound care consult.  #13 constipation Senokot-S.  #14 prophylaxis INR supratherapeutic at 4.05. Once INR becomes therapeutic Coumadin will be resumed per pharmacy for DVT prophylaxis.  Code Status: Partial: Family Communication: Updated patient. No family present. Disposition Plan: Transfer to stepdown unit.   Consultants:  PCCM admit  Cardiology  Procedures:  2-D echo 08/20/2014  Chest x-ray 08/21/2014, 08/19/2014, 08/20/2014, 08/21/2014  X-ray of the hip 08/19/2014  X-ray of the tib-fib 08/19/2014 neg for acute fx, mild knee/ankle joint osteo, extensive peripheral vascular calcification, focal soft tissue swelling  Antibiotics:  IV cefepime 08/21/2014   IV Zosyn 07/31/2014>>>>  08/21/2014  IV vancomycin 07/27/2014>>>> 08/21/2014  Oral doxycycline 08/22/2014>>>08/23/14  HPI/Subjective:  Fair No n/v Tol diet only failry Weak and frail per RN needeing 2 + assist No cp no  sob  Objective: Filed Vitals:   08/23/14 0800  BP:   Pulse:   Temp: 97.4 F (36.3 C)  Resp:     Intake/Output Summary (Last 24 hours) at 08/23/14 1020 Last data filed at 08/23/14 0600  Gross per 24 hour  Intake 863.96 ml  Output    825 ml  Net  38.96 ml   Filed Weights   08/19/14 0223 08/22/14 1324  Weight: 86.4 kg (190 lb 7.6 oz) 94.8 kg (208 lb 15.9 oz)    Exam:   General:  Using accessory muscles of respiration.  Cardiovascular: Irregularly irregular. +JVD       Respiratory: Bibasilar crackles.  ? AE R post lung fields-- Using accessory muscles of respiration.  Abdomen: Soft/NT/nd/+bs. NEPHROSTOMY TUBE.  Musculoskeletal: No c/c/. 3-4+ BLE edema to the hips. RLE with erythema.  Data Reviewed: Basic Metabolic Panel:  Recent Labs Lab 08/19/14 0745 08/20/14 0406 08/21/14 0350 08/22/14 0543 08/23/14 0522  NA 132* 133* 134* 133* 132*  K 4.3 4.1 4.0 4.4 4.3  CL 106 105 106 106 107  CO2 16* 18* 18* 17* 18*  GLUCOSE 107* 105* 113* 91 111*  BUN 127* 125* 110* 121* 111*  CREATININE 4.11* 4.02* 3.91* 3.78* 3.84*  CALCIUM 7.5* 7.7* 7.8* 8.0* 7.7*   Liver Function Tests:  Recent Labs Lab 08/20/14 0406  AST 16  ALT 14  ALKPHOS 71  BILITOT 1.4*  PROT 6.0  ALBUMIN 2.7*   No results for input(s): LIPASE, AMYLASE in the last 168 hours. No results for input(s): AMMONIA in the last 168 hours. CBC:  Recent Labs Lab 08/02/2014 2058 08/19/14 0745 08/20/14 0406 08/21/14 0350 08/22/14 0543 08/23/14 0522  WBC 11.5* 16.6* 15.4* 8.7 7.2 7.3  NEUTROABS 10.3*  --   --   --   --  6.1  HGB 11.3* 11.1* 10.6* 9.9* 9.8* 9.4*  HCT 34.6* 33.7* 32.3* 29.7* 30.6* 28.8*  MCV 96.1 94.9 94.4 94.9 95.0 94.1  PLT 107* 132* 127* 87* 88* 101*   Cardiac Enzymes:  Recent Labs Lab 08/19/14 0745 08/22/14 1230 08/22/14 1724 08/22/14 2320  CKTOTAL 87  --   --   --   TROPONINI  --  <0.03 0.03 0.03   BNP (last 3 results)  Recent Labs  08/21/14 0350 08/22/14 1230  08/23/14 0522  BNP 430.2* 647.7* 1201.9*    ProBNP (last 3 results)  Recent Labs  04/09/14 1038  PROBNP 9656.0*    CBG:  Recent Labs Lab 08/20/14 1225  GLUCAP 143*    Recent Results (from the past 240 hour(s))  Blood Culture (routine x 2)     Status: None (Preliminary result)   Collection Time: 08/20/2014  8:58 PM  Result Value Ref Range Status   Specimen Description BLOOD LEFT HAND  Final   Special Requests BOTTLES DRAWN AEROBIC ONLY 7CC  Final   Culture NO GROWTH 4 DAYS  Final   Report Status PENDING  Incomplete  Blood Culture (routine x 2)     Status: None (Preliminary result)   Collection Time: 08/09/2014  9:05 PM  Result Value Ref Range Status   Specimen Description BLOOD LEFT HAND  Final   Special Requests BOTTLES DRAWN AEROBIC ONLY 7CC  Final   Culture NO GROWTH 4 DAYS  Final   Report Status PENDING  Incomplete  Urine culture     Status: None   Collection Time: 08/24/2014 11:33 PM  Result Value Ref Range Status   Specimen Description URINE, CATHETERIZED  Final   Special Requests NONE  Final   Colony Count   Final    >=100,000 COLONIES/ML Performed at [facility]    Culture   Final    PSEUDOMONAS AERUGINOSA Performed at [facility]    Report Status 08/21/2014 FINAL  Final   Organism ID, Bacteria PSEUDOMONAS AERUGINOSA  Final      Susceptibility   Pseudomonas aeruginosa - MIC*    CEFEPIME 4 SENSITIVE Sensitive     CEFTAZIDIME 16 INTERMEDIATE Intermediate     CIPROFLOXACIN <=0.25 SENSITIVE Sensitive     GENTAMICIN <=1 SENSITIVE Sensitive     IMIPENEM 1 SENSITIVE Sensitive     TOBRAMYCIN <=1 SENSITIVE Sensitive     * PSEUDOMONAS AERUGINOSA  Urine culture     Status: None   Collection Time: 08/19/14  2:48 AM  Result Value Ref Range Status   Specimen Description URINE, SUPRAPUBIC  Final   Special Requests NONE  Final   Colony Count   Final    >=100,000 COLONIES/ML Performed at [facility]    Culture   Final    PSEUDOMONAS  AERUGINOSA Performed at [facility]    Report Status 08/21/2014 FINAL  Final   Organism ID, Bacteria PSEUDOMONAS AERUGINOSA  Final      Susceptibility   Pseudomonas aeruginosa - MIC*    CEFEPIME 8 SENSITIVE Sensitive     CEFTAZIDIME 16 INTERMEDIATE Intermediate     CIPROFLOXACIN <=0.25 SENSITIVE Sensitive     GENTAMICIN <=1 SENSITIVE Sensitive     IMIPENEM 1 SENSITIVE Sensitive     TOBRAMYCIN <=1 SENSITIVE Sensitive     * PSEUDOMONAS AERUGINOSA  MRSA PCR Screening     Status: None   Collection Time: 08/19/14  3:11 AM  Result Value Ref Range Status   MRSA by PCR NEGATIVE NEGATIVE Final    Comment:        The GeneXpert MRSA Assay (FDA approved for NASAL specimens only), is one component of a comprehensive MRSA colonization surveillance program. It is not intended to diagnose MRSA infection nor to guide or monitor treatment for MRSA infections.      Studies: Dg Chest Port 1 View  08/22/2014   CLINICAL DATA:  Shortness of Breath  EXAM: PORTABLE CHEST - 1 VIEW  COMPARISON:  August 21, 2014  FINDINGS: There is cardiomegaly with pulmonary venous hypertension. There is a right pleural effusion. There is interstitial edema throughout the lungs. There is no appreciable airspace consolidation. There is atelectatic change in the medial right base.  IMPRESSION: Findings consistent with congestive heart failure. Atelectasis medial right base, stable. No new opacity.   Electronically Signed   By: Bretta Bang III M.D.   On: 08/22/2014 12:12    Scheduled Meds: . ceFEPime (MAXIPIME) IV  1 g Intravenous Q24H  . doxycycline  100 mg Oral Q12H  . furosemide  40 mg Intravenous Once  . latanoprost  1 drop Both Eyes QHS  . vitamin B-12  1,000 mcg Oral Daily  . Warfarin - Pharmacist Dosing Inpatient   Does not apply q1800   Continuous Infusions: . sodium chloride 10 mL/hr at 08/21/14 1030  . amiodarone 30 mg/hr (08/23/14 0039)  . diltiazem (CARDIZEM) infusion Stopped (08/22/14  1148)    Principal Problem:   Acute respiratory failure Active Problems:  Chronic kidney disease, stage III (moderate)   Sepsis   Acute on chronic renal failure   Essential hypertension   Malnutrition of moderate degree   A-fib   Acute on chronic systolic heart failure   Cellulitis of leg, right   UTI (lower urinary tract infection)   Arterial hypotension   Cardiogenic shock   ADHF (acute decompensated heart failure)    Time spent: 30 MINS    Pleas Koch, MD [facility] [phone]

## 2014-08-24 DIAGNOSIS — I5043 Acute on chronic combined systolic (congestive) and diastolic (congestive) heart failure: Secondary | ICD-10-CM

## 2014-08-24 DIAGNOSIS — N183 Chronic kidney disease, stage 3 (moderate): Secondary | ICD-10-CM

## 2014-08-24 DIAGNOSIS — I270 Primary pulmonary hypertension: Secondary | ICD-10-CM

## 2014-08-24 DIAGNOSIS — I519 Heart disease, unspecified: Secondary | ICD-10-CM

## 2014-08-24 DIAGNOSIS — I4891 Unspecified atrial fibrillation: Secondary | ICD-10-CM

## 2014-08-24 DIAGNOSIS — I10 Essential (primary) hypertension: Secondary | ICD-10-CM

## 2014-08-24 LAB — BASIC METABOLIC PANEL
ANION GAP: 12 (ref 5–15)
BUN: 123 mg/dL — ABNORMAL HIGH (ref 6–23)
CHLORIDE: 107 mmol/L (ref 96–112)
CO2: 17 mmol/L — ABNORMAL LOW (ref 19–32)
Calcium: 8.3 mg/dL — ABNORMAL LOW (ref 8.4–10.5)
Creatinine, Ser: 3.63 mg/dL — ABNORMAL HIGH (ref 0.50–1.35)
GFR calc Af Amer: 16 mL/min — ABNORMAL LOW (ref >90)
GFR calc non Af Amer: 14 mL/min — ABNORMAL LOW (ref >90)
Glucose, Bld: 116 mg/dL — ABNORMAL HIGH (ref 70–99)
POTASSIUM: 4.4 mmol/L (ref 3.5–5.1)
Sodium: 136 mmol/L (ref 135–145)

## 2014-08-24 LAB — CBC WITH DIFFERENTIAL/PLATELET
Basophils Absolute: 0 10*3/uL (ref 0.0–0.1)
Basophils Relative: 0 % (ref 0–1)
EOS ABS: 0.1 10*3/uL (ref 0.0–0.7)
Eosinophils Relative: 1 % (ref 0–5)
HCT: 29.9 % — ABNORMAL LOW (ref 39.0–52.0)
Hemoglobin: 9.8 g/dL — ABNORMAL LOW (ref 13.0–17.0)
LYMPHS ABS: 0.6 10*3/uL — AB (ref 0.7–4.0)
Lymphocytes Relative: 7 % — ABNORMAL LOW (ref 12–46)
MCH: 30.8 pg (ref 26.0–34.0)
MCHC: 32.8 g/dL (ref 30.0–36.0)
MCV: 94 fL (ref 78.0–100.0)
Monocytes Absolute: 0.5 10*3/uL (ref 0.1–1.0)
Monocytes Relative: 6 % (ref 3–12)
Neutro Abs: 7 10*3/uL (ref 1.7–7.7)
Neutrophils Relative %: 86 % — ABNORMAL HIGH (ref 43–77)
Platelets: 107 10*3/uL — ABNORMAL LOW (ref 150–400)
RBC: 3.18 MIL/uL — ABNORMAL LOW (ref 4.22–5.81)
RDW: 20 % — AB (ref 11.5–15.5)
WBC: 8.2 10*3/uL (ref 4.0–10.5)

## 2014-08-24 LAB — CULTURE, BLOOD (ROUTINE X 2)
Culture: NO GROWTH
Culture: NO GROWTH

## 2014-08-24 LAB — PROTIME-INR
INR: 4.32 — ABNORMAL HIGH (ref 0.00–1.49)
Prothrombin Time: 41.7 seconds — ABNORMAL HIGH (ref 11.6–15.2)

## 2014-08-24 MED ORDER — FUROSEMIDE 10 MG/ML IJ SOLN
40.0000 mg | Freq: Once | INTRAMUSCULAR | Status: AC
  Administered 2014-08-24: 40 mg via INTRAVENOUS
  Filled 2014-08-24: qty 4

## 2014-08-24 MED ORDER — SODIUM CHLORIDE 0.9 % IJ SOLN
3.0000 mL | Freq: Two times a day (BID) | INTRAMUSCULAR | Status: DC
  Administered 2014-08-24 – 2014-08-25 (×5): 3 mL via INTRAVENOUS
  Administered 2014-08-26: 10:00:00 via INTRAVENOUS
  Administered 2014-08-26 – 2014-09-01 (×11): 3 mL via INTRAVENOUS

## 2014-08-24 NOTE — Progress Notes (Addendum)
ANTICOAGULATION CONSULT NOTE - Follow up  Pharmacy Consult for warfarin Indication: atrial fibrillation  Allergies  Allergen Reactions  . Sulfa Antibiotics Rash    Nausea    Patient Measurements: Height:  (was not able get pt. weight bed not operating) Weight: 211 lb 3.2 oz (95.8 kg) IBW/kg (Calculated) : 73  Vital Signs: Temp: 97.8 F (36.6 C) (04/30 0333) Temp Source: Oral (04/30 0333) BP: 120/69 mmHg (04/30 0700) Pulse Rate: 116 (04/30 0700)  Labs:  Recent Labs  08/22/14 0543 08/22/14 1230 08/22/14 1724 08/22/14 2320 08/23/14 0522 08/24/14 0350  HGB 9.8*  --   --   --  9.4* 9.8*  HCT 30.6*  --   --   --  28.8* 29.9*  PLT 88*  --   --   --  101* 107*  LABPROT 39.7*  --   --   --  42.9* 41.7*  INR 4.05*  --   --   --  4.48* 4.32*  CREATININE 3.78*  --   --   --  3.84* 3.63*  TROPONINI  --  <0.03 0.03 0.03  --   --    Estimated Creatinine Clearance: 17 mL/min (by C-G formula based on Cr of 3.63).  Medications:  Scheduled:  . ceFEPime (MAXIPIME) IV  1 g Intravenous Q24H  . collagenase   Topical Daily  . latanoprost  1 drop Both Eyes QHS  . vitamin B-12  1,000 mcg Oral Daily  . Warfarin - Pharmacist Dosing Inpatient   Does not apply q1800   Assessment: 81yo M presented to [facility] w/ leg swelling, suspected cellulitis, elev SCr, and supratherapeutic INR. Pharmacy was consulted to dose warfarin. Recent course of Vantin could have contributed to supratherapeutic INR. Denies significant changes in dietary intake. I confirmed his most recent warfarin regimen with him: 2.5mg  daily except nothing on Wednesdays.  Significant events: 4/25: INR improved to 3.12 after vit.K 10mg  and FFP.  4/26: INR dropped to 2. Gave warf 2.5mg . Started amiodarone. 4/27: INR took large jump, now slightly above therapeutic range 4/28: INR increased further, Doxycycline added- d/c 4/29  Today, 08/24/2014:  INR continues supratherapeutic, poor po intake on Heart Healthy diet, Amiodarone and  Doxycycline can increase INR  H/H and platelets low, stable. No bleeding reported/documented.  Amiodarone will increase sensitivity to warfarin as it reaches steady-state over the next couple of weeks.   Goal of Therapy:  INR 2-3   Plan:  No warfarin today.  Check PT/INR daily.  Otho Bellows PharmD Pager [phone] 08/24/2014, 8:13 AM

## 2014-08-24 NOTE — Progress Notes (Signed)
Pt.is A/Ox4 and is resting in bed. He had c/o lower back pain, pt.repositioned and prn pain medication was given and was effective. He remains on amiodarone drip. HR remains a-fib, 110's to low 120's and patient is asymptomatic. MD on call for [facility] Brion Aliment, NP was called and notified. No new orders written. Will continue to monitor.

## 2014-08-24 NOTE — Progress Notes (Signed)
SUBJECTIVE: Pt denies chest pain. Says breathing isn't bothersome, other than he is unable to lie flat. Primary complaint is bilateral leg swelling.     Intake/Output Summary (Last 24 hours) at 08/24/14 0811 Last data filed at 08/24/14 0700  Gross per 24 hour  Intake  664.1 ml  Output    975 ml  Net -310.9 ml    Current Facility-Administered Medications  Medication Dose Route Frequency Provider Last Rate Last Dose  . 0.9 %  sodium chloride infusion   Intravenous Continuous Jeanella Craze, NP 10 mL/hr at 08/21/14 1030    . acetaminophen (TYLENOL) tablet 1,000 mg  1,000 mg Oral Q6H PRN Bernadene Person, NP      . amiodarone (NEXTERONE PREMIX) 360 MG/200ML (1.8 mg/mL) IV infusion  30 mg/hr Intravenous Continuous Lupita Leash, MD 16.7 mL/hr at 08/23/14 2000 30 mg/hr at 08/23/14 2000  . ceFEPIme (MAXIPIME) 1 g in dextrose 5 % 50 mL IVPB  1 g Intravenous Q24H Lupita Leash, MD   1 g at 08/23/14 1822  . collagenase (SANTYL) ointment   Topical Daily Rhetta Mura, MD      . diltiazem (CARDIZEM) 100 mg in dextrose 5 % 100 mL (1 mg/mL) infusion  5-15 mg/hr Intravenous Continuous Rodolph Bong, MD   Stopped at 08/22/14 1148  . HYDROcodone-acetaminophen (NORCO/VICODIN) 5-325 MG per tablet 0.5-1 tablet  0.5-1 tablet Oral Q6H PRN Bernadene Person, NP   1 tablet at 08/21/14 1028  . latanoprost (XALATAN) 0.005 % ophthalmic solution 1 drop  1 drop Both Eyes QHS Bernadene Person, NP   1 drop at 08/23/14 2153  . senna-docusate (Senokot-S) tablet 1 tablet  1 tablet Oral Daily PRN Jeanella Craze, NP   1 tablet at 08/21/14 1028  . vitamin B-12 (CYANOCOBALAMIN) tablet 1,000 mcg  1,000 mcg Oral Daily Bernadene Person, NP   1,000 mcg at 08/23/14 1100  . Warfarin - Pharmacist Dosing Inpatient   Does not apply q1800 Coralyn Helling, MD   Stopped at 08/21/14 1800    Filed Vitals:   08/24/14 0333 08/24/14 0400 08/24/14 0500 08/24/14 0700  BP:  98/57 99/69 120/69  Pulse:   126 150 116  Temp: 97.8 F (36.6 C)     TempSrc: Oral     Resp:  18 22 25   Height:      Weight:   211 lb 3.2 oz (95.8 kg)   SpO2:  94% 94% 95%    PHYSICAL EXAM General: ill-appearing, sitting upright. HEENT: EOMI. Neck: +JVD  Lungs: Diminished breath sounds b/l. CV: Tachycardic, irregular rhythm, normal S1/S2, no S3, no murmur.  3+ pitting pretibial edema b/l.  Abdomen: Soft, nontender,no distention.  Neurologic: Alert and oriented.  Psych: Normal affect. Musculoskeletal: No gross deformities. Extremities: No clubbing or cyanosis.   TELEMETRY: Reviewed telemetry pt in atrial fibrillation with PVC's (HR 116-124 bpm)  LABS: Basic Metabolic Panel:  Recent Labs  16/10/96 0522 08/24/14 0350  NA 132* 136  K 4.3 4.4  CL 107 107  CO2 18* 17*  GLUCOSE 111* 116*  BUN 111* 123*  CREATININE 3.84* 3.63*  CALCIUM 7.7* 8.3*   Liver Function Tests: No results for input(s): AST, ALT, ALKPHOS, BILITOT, PROT, ALBUMIN in the last 72 hours. No results for input(s): LIPASE, AMYLASE in the last 72 hours. CBC:  Recent Labs  08/23/14 0522 08/24/14 0350  WBC 7.3 8.2  NEUTROABS 6.1 7.0  HGB 9.4* 9.8*  HCT 28.8*  29.9*  MCV 94.1 94.0  PLT 101* 107*   Cardiac Enzymes:  Recent Labs  08/22/14 1230 08/22/14 1724 08/22/14 2320  TROPONINI <0.03 0.03 0.03   BNP: Invalid input(s): POCBNP D-Dimer: No results for input(s): DDIMER in the last 72 hours. Hemoglobin A1C: No results for input(s): HGBA1C in the last 72 hours. Fasting Lipid Panel: No results for input(s): CHOL, HDL, LDLCALC, TRIG, CHOLHDL, LDLDIRECT in the last 72 hours. Thyroid Function Tests: No results for input(s): TSH, T4TOTAL, T3FREE, THYROIDAB in the last 72 hours.  Invalid input(s): FREET3 Anemia Panel: No results for input(s): VITAMINB12, FOLATE, FERRITIN, TIBC, IRON, RETICCTPCT in the last 72 hours.  RADIOLOGY: Dg Chest 1 View  08/23/2014   CLINICAL DATA:  Evaluate pleural effusion.  History of CHF.   EXAM: CHEST  1 VIEW  COMPARISON:  08/22/2014  FINDINGS: Previous median sternotomy and CABG procedure. Aortic atherosclerosis noted. The heart size is enlarged. There is a right pleural effusion which is unchanged in volume from previous exam. Mild interstitial edema is noted.  IMPRESSION: 1. Stable CHF pattern.   Electronically Signed   By: Signa Kell M.D.   On: 08/23/2014 14:01   Dg Chest 2 View  2014/08/22   CLINICAL DATA:  81 year old male with a history of leg swelling  EXAM: CHEST - 2 VIEW  COMPARISON:  08/02/2014  FINDINGS: Cardiomediastinal silhouette unchanged. Atherosclerotic calcifications of the aortic arch.  Surgical changes of median sternotomy.  Dense opacity at the right base persists, unchanged from prior. No left pleural effusion identified.  No pneumothorax.  No displaced fracture.  Unremarkable appearance of the upper abdomen.  IMPRESSION: Persisting right pleural effusion with associated atelectasis. Infection difficult to exclude.  Atherosclerosis with evidence of median sternotomy.  Signed,  Yvone Neu. Loreta Ave, DO  [facility]  [facility]   Electronically Signed   By: Gilmer Mor D.O.   On: 2014-08-22 21:39   Dg Chest 2 View  08/02/2014   CLINICAL DATA:  Pleural fluid.  Congestion.  EXAM: CHEST  2 VIEW  COMPARISON:  07/12/2014  FINDINGS: Large right pleural effusion has enlarged to about 50% of right hemithoracic volume, with associated passive atelectasis. Stable cardiomegaly although the right heart border is obscured.  Atherosclerotic aortic arch. Prior median sternotomy. Mild peripheral interstitial accentuation in the lungs, although improved from 07/12/2014.  IMPRESSION: 1. Enlarged right pleural effusion, now about 50% of right hemithoracic volume. 2. Very faint residual interstitial edema, best seen at the left lung base. Most of the edema has resolved. 3. Cardiomegaly.   Electronically Signed   By: Gaylyn Rong M.D.   On:  08/02/2014 15:42   Dg Tibia/fibula Right  08/19/2014   CLINICAL DATA:  Right leg pain.  No known injury.  EXAM: RIGHT TIBIA AND FIBULA - 2 VIEW  COMPARISON:  None.  FINDINGS: Focal soft tissue swelling is seen along the anterior aspect of the tibial diaphysis. No evidence of fracture or other focal bone lesions.  Mild degenerative spurring is seen involving the patella and ankle joint. Extensive peripheral vascular calcification is noted.  IMPRESSION: Focal soft tissue swelling along the anterior aspect of the tibial diaphysis. No evidence of fracture.  Mild knee and ankle joint osteoarthritis.  Extensive peripheral vascular calcification.   Electronically Signed   By: Myles Rosenthal M.D.   On: 08/19/2014 14:39   Dg Chest Port 1 View  08/22/2014   CLINICAL DATA:  Shortness of Breath  EXAM: PORTABLE CHEST - 1 VIEW  COMPARISON:  August 21, 2014  FINDINGS: There is cardiomegaly with pulmonary venous hypertension. There is a right pleural effusion. There is interstitial edema throughout the lungs. There is no appreciable airspace consolidation. There is atelectatic change in the medial right base.  IMPRESSION: Findings consistent with congestive heart failure. Atelectasis medial right base, stable. No new opacity.   Electronically Signed   By: Bretta Bang III M.D.   On: 08/22/2014 12:12   Dg Chest Port 1 View  08/21/2014   CLINICAL DATA:  Pleural effusion.  EXAM: PORTABLE CHEST - 1 VIEW  COMPARISON:  08/23/2014.  FINDINGS: Mediastinum hilar structures stable. Prior median sternotomy. Stable cardiomegaly. Mild pulmonary vascular prominence and interstitial prominence suggesting mild congestive heart failure. Right pleural effusion again noted and unchanged. Tiny left pleural effusion cannot be excluded. No pneumothorax.  IMPRESSION: Findings consistent with mild congestive heart failure with mild pulmonary interstitial edema and stable right pleural effusion. Tiny left pleural effusion cannot be excluded .  Prior median sternotomy.   Electronically Signed   By: Maisie Fus  Register   On: 08/21/2014 07:14   Dg Chest Port 1 View  08/20/2014   CLINICAL DATA:  Pleural effusion.  Cardiomyopathy.  EXAM: PORTABLE CHEST - 1 VIEW  COMPARISON:  08/04/2014.  FINDINGS: The heart is enlarged. A moderate size right pleural effusion may be slightly increased this likely layering with patient at night angle. A small left pleural effusion is now evident. Mild edema has increased. Bibasilar airspace disease is noted. The visualized soft tissues and bony thorax are unremarkable.  IMPRESSION: 1. Congestive heart failure with progressive edema. 2. Stable slight increase in right pleural effusion. 3. New small left pleural effusion.   Electronically Signed   By: Marin Roberts M.D.   On: 08/20/2014 07:27   Dg Hip Unilat With Pelvis 1v Right  08/19/2014   CLINICAL DATA:  RIGHT hip pain.  EXAM: RIGHT HIP (WITH PELVIS) 1 VIEW  COMPARISON:  None.  FINDINGS: There is no evidence of hip fracture or dislocation. There is no evidence of arthropathy or other focal bone abnormality. Marked vascular calcification. Previous iliac stent placement with endovascular coiling.  IMPRESSION: No acute abnormality is evident.   Electronically Signed   By: Davonna Belling M.D.   On: 08/19/2014 14:39      ASSESSMENT AND PLAN:  81 year old with chronic systolic heart failure, ejection fraction 35%, persistent atrial fibrillation previously on chronic anticoagulation with Coumadin, prior persistent hypotension resulting in challenging control of atrial fibrillation, chronic kidney disease-no longer on digoxin.  1. Chronic systolic heart failure-Markedly volume overloaded with possible sepsis and hypotension with CKD stage 3, on antimicrobial therapy. I will give one dose of IV Lasix 40 mg today. He has marked lower extremity edema which has been a challenge to treat. I agree that ACE wraps/compression would be helpful if okay from a wound standpoint.     2. Atrial fibrillation-persistent- continue amiodarone drip as he continues to be tachycardic, but would transition him to oral amiodarone, 200 mg twice a day over the next 2 weeks if his heart rate remains stable tomorrow. Currently HR 116-124 bpm. Go back to 200 mg daily after that. Amiodarone is being utilized for rate control purposes. Diltiazem is ineffective because of hypotension. No digoxin because of chronic kidney disease. No Toprol because of hypotension.  3. Chronic anticoagulation-continue with Coumadin. INR supratherapeutic, likely related to antibiotic use.  4. Massive lower extremity edema-PA pressures of 50 mmHg systolic. This will continue to be challenging  to treat. Likely severe chronic venous insufficiency is playing a role as well. Could try Ace wraps as noted above. Lasix has been challenging because of hypotension but will give one dose 40 mg IV today..  5. Prognosis-given his cardiomyopathy, massive edema, considerable hypotension, overall cardiovascular prognosis is poor over the next year. Trying to optimize medical therapy to the best of our ability.   Prentice Docker, M.D., F.A.C.C.

## 2014-08-24 NOTE — Progress Notes (Signed)
[facility] PROGRESS NOTE  [name]:[id number] DOB: [birth year] DOA: 08/08/2014 PCP: Lubertha South, MD   Brief narrative: 81 y/o ? Chr A.fib CHAD2 score 3 +prio AC now off 2/2 to Melena, Stg III, Post-obs Uropathy, Chr Systolic HF 35-40%, OPrior L Hydronephrosis, prior nephrolithiasis 1980'sm, repair thoracic aneurysm 1995 and 1996 admitted by PCCM on 4/25/16with Acute respiratory failure 2/2 to R pleural effusion + SIRS/Sepsis +m coagulopathy He was seen by Cardiology as well and found to have Chr Systolic HF with new-onset Afib this admission-? 2/2 to Tachyarrythmia related CM He has been diuresed per Cardiology but has a poor long-term porgnosis given inability to use B blocker  Assessment/Plan:  #1 acute respiratory distress Likely secondary to afib with RVR and acute on chronic systolic CHF.  significantly volume overloaded/anasarca Slow improvement and no O2 requirement 08/24/2014  #2 A. fib with RVR Likely secondary to sepsis. Patient on empiric IV antibiotics for Pseudomonas UTI.  Cycle cardiac enzymes every 6 hours 3.  Patient had a 2-D echo done early on in this hospitalization which showed a EF= 30 to 35% with no wall motion abnormalities.  When INR is within the therapeutic window Coumadin will be resumed per PharmD Appreciate Cardiology opinion  #3 acute on chronic systolic heart failure Patient noted to be visibly heart failure. However blood pressure is borderline and difficult to diureses. Patient also in atrial fibrillation with RVR. Check a BNP.  Cycle cardiac enzymes every 6 hours 3.  Patient with recent 2-D echo done on 08/20/2014 which showed a EF of 30-35% with no wall motion abnormalities. We'll try to control heart rate. Hold off on diuretics at this time as blood pressure is in the high 80s.  I/o cumulative= +4.43 liters  #4 hypotension limiting diuresis and rate control  #5 sepsis Secondary to Pseudomonas UTI and right lower extremity  cellulitis. Patient on empiric IV cefepime. Follow.  #6 Pseudomonas UTI On IV cefepime--Stop date placed  #7 right lower extremity cellulitis On IV cefepime.  D/c Doxy as wound to leg doesn't appear infected at this time [facility] nurse has seen her  #8 acute on chronic renal failure Likely secondary to cardiorenal syndrome. Patient with a poor ejection fraction and currently in acute heart failure with A. fib with RVR.  Get Goals of care meeting by 5/1 if no improvmeent  #9 anemia Likely anemia of chronic disease. No overt bleeding. Follow H&H.  #10 Coagulopathy Patient's INR was greater than 10 on admission. INR today is 4.05. Hold Coumadin dose today.  #11 thrombocytopenia Likely secondary to acute infection. No overt bleeding. Follow H&H.  #12 right lower extremity wound Wound care consult appreciated Rec's=  4 cm x 2 cm wound bed is 100% eschar, separated from wound margins, boggy and fluctuant  Cleanse right lateral lower leg with NS and pat gently dry. Apply Santyl ointment, 1/8 inch thickness (opaque). Top with NS moist dressing. Cover with 4x4 gauze and secure with kerlix and tape. Change daily.    #13 constipation Senokot-S.  #14 prophylaxis INR supratherapeutic at 4.05. Once INR becomes therapeutic Coumadin will be resumed per pharmacy for DVT prophylaxis.  Code Status: Partial: Family Communication: Updated patient. No family present. Disposition Plan: keep SDU Anticipate transfer tele when more stable from Afib standpoint    Consultants:  PCCM admit  Cardiology  Procedures:  2-D echo 08/20/2014  Chest x-ray 08/21/2014, 08/19/2014, 08/20/2014, 08/21/2014  X-ray of the hip 08/19/2014  X-ray of the tib-fib 08/19/2014 neg for acute  fx, mild knee/ankle joint osteo, extensive peripheral vascular calcification, focal soft tissue swelling  Antibiotics:  IV cefepime 08/21/2014   IV Zosyn 08/11/2014>>>> 08/21/2014  IV vancomycin 08/24/2014>>>>  08/21/2014  Oral doxycycline 08/22/2014>>>08/23/14    HPI/Subjective:  Better slightly  No cp SOB seems resolved No fever  No chills  Objective: Filed Vitals:   08/24/14 0500  BP: 99/69  Pulse: 150  Temp:   Resp: 22    Intake/Output Summary (Last 24 hours) at 08/24/14 0721 Last data filed at 08/24/14 0500  Gross per 24 hour  Intake  637.4 ml  Output    875 ml  Net -237.6 ml   Filed Weights   08/19/14 0223 08/22/14 1324 08/24/14 0500  Weight: 86.4 kg (190 lb 7.6 oz) 94.8 kg (208 lb 15.9 oz) 95.8 kg (211 lb 3.2 oz)    Exam:   General:  Using accessory muscles of respiration.  Cardiovascular: Irregularly irregular. +JVD       Respiratory: Bibasilar crackles.  ? AE R post lung fields-- Using accessory muscles of respiration.  Abdomen: Soft/NT/nd/+bs. NEPHROSTOMY TUBE.  Musculoskeletal: No c/c/. 3-4+ BLE edema to the hips. RLE with erythema.  Data Reviewed: Basic Metabolic Panel:  Recent Labs Lab 08/20/14 0406 08/21/14 0350 08/22/14 0543 08/23/14 0522 08/24/14 0350  NA 133* 134* 133* 132* 136  K 4.1 4.0 4.4 4.3 4.4  CL 105 106 106 107 107  CO2 18* 18* 17* 18* 17*  GLUCOSE 105* 113* 91 111* 116*  BUN 125* 110* 121* 111* 123*  CREATININE 4.02* 3.91* 3.78* 3.84* 3.63*  CALCIUM 7.7* 7.8* 8.0* 7.7* 8.3*   Liver Function Tests:  Recent Labs Lab 08/20/14 0406  AST 16  ALT 14  ALKPHOS 71  BILITOT 1.4*  PROT 6.0  ALBUMIN 2.7*   No results for input(s): LIPASE, AMYLASE in the last 168 hours. No results for input(s): AMMONIA in the last 168 hours. CBC:  Recent Labs Lab 08/22/2014 2058  08/20/14 0406 08/21/14 0350 08/22/14 0543 08/23/14 0522 08/24/14 0350  WBC 11.5*  < > 15.4* 8.7 7.2 7.3 8.2  NEUTROABS 10.3*  --   --   --   --  6.1 7.0  HGB 11.3*  < > 10.6* 9.9* 9.8* 9.4* 9.8*  HCT 34.6*  < > 32.3* 29.7* 30.6* 28.8* 29.9*  MCV 96.1  < > 94.4 94.9 95.0 94.1 94.0  PLT 107*  < > 127* 87* 88* 101* 107*  < > = values in this interval not  displayed. Cardiac Enzymes:  Recent Labs Lab 08/19/14 0745 08/22/14 1230 08/22/14 1724 08/22/14 2320  CKTOTAL 87  --   --   --   TROPONINI  --  <0.03 0.03 0.03   BNP (last 3 results)  Recent Labs  08/21/14 0350 08/22/14 1230 08/23/14 0522  BNP 430.2* 647.7* 1201.9*    ProBNP (last 3 results)  Recent Labs  04/09/14 1038  PROBNP 9656.0*    CBG:  Recent Labs Lab 08/20/14 1225  GLUCAP 143*    Recent Results (from the past 240 hour(s))  Blood Culture (routine x 2)     Status: None   Collection Time: 08/13/2014  8:58 PM  Result Value Ref Range Status   Specimen Description BLOOD LEFT HAND  Final   Special Requests BOTTLES DRAWN AEROBIC ONLY 7CC  Final   Culture NO GROWTH 6 DAYS  Final   Report Status 08/24/2014 FINAL  Final  Blood Culture (routine x 2)     Status: None  Collection Time: 2014/09/17  9:05 PM  Result Value Ref Range Status   Specimen Description BLOOD LEFT HAND  Final   Special Requests BOTTLES DRAWN AEROBIC ONLY 7CC  Final   Culture NO GROWTH 6 DAYS  Final   Report Status 08/24/2014 FINAL  Final  Urine culture     Status: None   Collection Time: Sep 17, 2014 11:33 PM  Result Value Ref Range Status   Specimen Description URINE, CATHETERIZED  Final   Special Requests NONE  Final   Colony Count   Final    >=100,000 COLONIES/ML Performed at [facility]    Culture   Final    PSEUDOMONAS AERUGINOSA Performed at [facility]    Report Status 08/21/2014 FINAL  Final   Organism ID, Bacteria PSEUDOMONAS AERUGINOSA  Final      Susceptibility   Pseudomonas aeruginosa - MIC*    CEFEPIME 4 SENSITIVE Sensitive     CEFTAZIDIME 16 INTERMEDIATE Intermediate     CIPROFLOXACIN <=0.25 SENSITIVE Sensitive     GENTAMICIN <=1 SENSITIVE Sensitive     IMIPENEM 1 SENSITIVE Sensitive     TOBRAMYCIN <=1 SENSITIVE Sensitive     * PSEUDOMONAS AERUGINOSA  Urine culture     Status: None   Collection Time: 08/19/14  2:48 AM  Result Value Ref Range  Status   Specimen Description URINE, SUPRAPUBIC  Final   Special Requests NONE  Final   Colony Count   Final    >=100,000 COLONIES/ML Performed at [facility]    Culture   Final    PSEUDOMONAS AERUGINOSA Performed at [facility]    Report Status 08/21/2014 FINAL  Final   Organism ID, Bacteria PSEUDOMONAS AERUGINOSA  Final      Susceptibility   Pseudomonas aeruginosa - MIC*    CEFEPIME 8 SENSITIVE Sensitive     CEFTAZIDIME 16 INTERMEDIATE Intermediate     CIPROFLOXACIN <=0.25 SENSITIVE Sensitive     GENTAMICIN <=1 SENSITIVE Sensitive     IMIPENEM 1 SENSITIVE Sensitive     TOBRAMYCIN <=1 SENSITIVE Sensitive     * PSEUDOMONAS AERUGINOSA  MRSA PCR Screening     Status: None   Collection Time: 08/19/14  3:11 AM  Result Value Ref Range Status   MRSA by PCR NEGATIVE NEGATIVE Final    Comment:        The GeneXpert MRSA Assay (FDA approved for NASAL specimens only), is one component of a comprehensive MRSA colonization surveillance program. It is not intended to diagnose MRSA infection nor to guide or monitor treatment for MRSA infections.      Studies: Dg Chest 1 View  08/23/2014   CLINICAL DATA:  Evaluate pleural effusion.  History of CHF.  EXAM: CHEST  1 VIEW  COMPARISON:  08/22/2014  FINDINGS: Previous median sternotomy and CABG procedure. Aortic atherosclerosis noted. The heart size is enlarged. There is a right pleural effusion which is unchanged in volume from previous exam. Mild interstitial edema is noted.  IMPRESSION: 1. Stable CHF pattern.   Electronically Signed   By: Signa Kell M.D.   On: 08/23/2014 14:01   Dg Chest Port 1 View  08/22/2014   CLINICAL DATA:  Shortness of Breath  EXAM: PORTABLE CHEST - 1 VIEW  COMPARISON:  August 21, 2014  FINDINGS: There is cardiomegaly with pulmonary venous hypertension. There is a right pleural effusion. There is interstitial edema throughout the lungs. There is no appreciable airspace consolidation. There is  atelectatic change in the medial right base.  IMPRESSION: Findings consistent with congestive heart failure. Atelectasis medial right base, stable. No new opacity.   Electronically Signed   By: Bretta Bang III M.D.   On: 08/22/2014 12:12    Scheduled Meds: . ceFEPime (MAXIPIME) IV  1 g Intravenous Q24H  . collagenase   Topical Daily  . latanoprost  1 drop Both Eyes QHS  . vitamin B-12  1,000 mcg Oral Daily  . Warfarin - Pharmacist Dosing Inpatient   Does not apply q1800   Continuous Infusions: . sodium chloride 10 mL/hr at 08/21/14 1030  . amiodarone 30 mg/hr (08/23/14 2000)  . diltiazem (CARDIZEM) infusion Stopped (08/22/14 1148)    Principal Problem:   Acute respiratory failure Active Problems:   Chronic kidney disease, stage III (moderate)   Sepsis   Acute on chronic renal failure   Essential hypertension   Malnutrition of moderate degree   A-fib   Acute on chronic systolic heart failure   Cellulitis of leg, right   UTI (lower urinary tract infection)   Arterial hypotension   Cardiogenic shock   ADHF (acute decompensated heart failure)    Time spent: 30 MINS    Pleas Koch, MD [facility] (P) [phone]

## 2014-08-24 NOTE — Progress Notes (Signed)
Spoke with Attending MD, Dr.Smatani this am. Was told to evaluate patient oxygen saturation without oxygen nasal cannula. Pt.oxygen saturation is above 90% ranging  93-99% at rest. He has no signs of respiratory distress.

## 2014-08-25 ENCOUNTER — Inpatient Hospital Stay (HOSPITAL_COMMUNITY): Payer: Medicare Other

## 2014-08-25 DIAGNOSIS — J90 Pleural effusion, not elsewhere classified: Secondary | ICD-10-CM

## 2014-08-25 DIAGNOSIS — R52 Pain, unspecified: Secondary | ICD-10-CM | POA: Insufficient documentation

## 2014-08-25 DIAGNOSIS — M7989 Other specified soft tissue disorders: Secondary | ICD-10-CM

## 2014-08-25 DIAGNOSIS — N99528 Other complication of other external stoma of urinary tract: Secondary | ICD-10-CM

## 2014-08-25 DIAGNOSIS — A408 Other streptococcal sepsis: Secondary | ICD-10-CM

## 2014-08-25 LAB — COMPREHENSIVE METABOLIC PANEL
ALK PHOS: 68 U/L (ref 38–126)
ALT: 12 U/L — ABNORMAL LOW (ref 17–63)
AST: 13 U/L — ABNORMAL LOW (ref 15–41)
Albumin: 2.3 g/dL — ABNORMAL LOW (ref 3.5–5.0)
Anion gap: 7 (ref 5–15)
BUN: 118 mg/dL — ABNORMAL HIGH (ref 6–20)
CALCIUM: 7.9 mg/dL — AB (ref 8.9–10.3)
CO2: 18 mmol/L — ABNORMAL LOW (ref 22–32)
Chloride: 109 mmol/L (ref 101–111)
Creatinine, Ser: 3.55 mg/dL — ABNORMAL HIGH (ref 0.61–1.24)
GFR calc Af Amer: 17 mL/min — ABNORMAL LOW (ref >60)
GFR, EST NON AFRICAN AMERICAN: 14 mL/min — AB (ref >60)
Glucose, Bld: 87 mg/dL (ref 70–99)
Potassium: 4.5 mmol/L (ref 3.5–5.1)
Sodium: 134 mmol/L — ABNORMAL LOW (ref 135–145)
Total Bilirubin: 0.7 mg/dL (ref 0.3–1.2)
Total Protein: 6.1 g/dL — ABNORMAL LOW (ref 6.5–8.1)

## 2014-08-25 LAB — CBC WITH DIFFERENTIAL/PLATELET
BASOS ABS: 0 10*3/uL (ref 0.0–0.1)
Basophils Relative: 0 % (ref 0–1)
EOS ABS: 0.2 10*3/uL (ref 0.0–0.7)
Eosinophils Relative: 2 % (ref 0–5)
HCT: 29.5 % — ABNORMAL LOW (ref 39.0–52.0)
Hemoglobin: 9.5 g/dL — ABNORMAL LOW (ref 13.0–17.0)
Lymphocytes Relative: 4 % — ABNORMAL LOW (ref 12–46)
Lymphs Abs: 0.4 10*3/uL — ABNORMAL LOW (ref 0.7–4.0)
MCH: 30.4 pg (ref 26.0–34.0)
MCHC: 32.2 g/dL (ref 30.0–36.0)
MCV: 94.2 fL (ref 78.0–100.0)
MONOS PCT: 5 % (ref 3–12)
Monocytes Absolute: 0.4 10*3/uL (ref 0.1–1.0)
NEUTROS ABS: 7.5 10*3/uL (ref 1.7–7.7)
NEUTROS PCT: 89 % — AB (ref 43–77)
Platelets: 120 10*3/uL — ABNORMAL LOW (ref 150–400)
RBC: 3.13 MIL/uL — ABNORMAL LOW (ref 4.22–5.81)
RDW: 20.1 % — AB (ref 11.5–15.5)
WBC: 8.4 10*3/uL (ref 4.0–10.5)

## 2014-08-25 LAB — PROTIME-INR
INR: 4.43 — ABNORMAL HIGH (ref 0.00–1.49)
PROTHROMBIN TIME: 42.5 s — AB (ref 11.6–15.2)

## 2014-08-25 LAB — PROCALCITONIN: PROCALCITONIN: 0.39 ng/mL

## 2014-08-25 LAB — LACTIC ACID, PLASMA
LACTIC ACID, VENOUS: 1.6 mmol/L (ref 0.5–2.0)
Lactic Acid, Venous: 1.1 mmol/L (ref 0.5–2.0)

## 2014-08-25 MED ORDER — RESOURCE THICKENUP CLEAR PO POWD
ORAL | Status: DC | PRN
  Filled 2014-08-25: qty 125

## 2014-08-25 MED ORDER — SODIUM CHLORIDE 0.9 % IV BOLUS (SEPSIS)
500.0000 mL | Freq: Once | INTRAVENOUS | Status: AC
  Administered 2014-08-25: 500 mL via INTRAVENOUS

## 2014-08-25 MED ORDER — PHYTONADIONE 5 MG PO TABS
5.0000 mg | ORAL_TABLET | Freq: Once | ORAL | Status: AC
  Administered 2014-08-25: 5 mg via ORAL
  Filled 2014-08-25 (×2): qty 1

## 2014-08-25 MED ORDER — STARCH (THICKENING) PO POWD
ORAL | Status: DC | PRN
  Filled 2014-08-25: qty 227

## 2014-08-25 MED ORDER — AMIODARONE HCL IN DEXTROSE 360-4.14 MG/200ML-% IV SOLN
30.0000 mg/h | INTRAVENOUS | Status: DC
  Administered 2014-08-25 – 2014-08-27 (×5): 30 mg/h via INTRAVENOUS
  Filled 2014-08-25 (×5): qty 200

## 2014-08-25 NOTE — Progress Notes (Signed)
[facility] PROGRESS NOTE  [name] SVX:[id number] DOB: 1925/08/16 DOA: 08/14/2014 PCP: Edward South, MD  Brief narrative:  81 y/o ? Chr A.fib CHAD2 score 3 +prio AC now off 2/2 to Melena in the past, Stg III CKD, Post-obs Uropathy, Chr Systolic HF 35-40%, Prior L Hydronephrosis, prior nephrolithiasis 1980's-->s/p L nephrostomy placed 2015-- repair thoracic aneurysm 1995 and 1996 admitted by PCCM on 08/19/14 with Acute respiratory failure 2/2 to R pleural effusion + SIRS/Sepsis +m coagulopathy He was seen by Cardiology as well and found to have Chr Systolic HF with new-onset Afib this admission-? 2/2 to Tachyarrythmia related CM He has been diuresed per Cardiology but has a poor long-term prognosis given inability to use rate controlling agents as well as massive anasarca limiting diuretic use to control edema HIs PErc Neprhostomy seeemd to be more bloody overnight 4/30 and patient experienced hypotension requiring an IV saline bolus, Amio gtt was turned off.  Assessment/Plan:  #1 Sepsis Secondary to Pseudomonas UTI and right lower extremity cellulitis. Patient on empiric IV cefepime. Follow.alcohol use backOn IV cefepime--Stop date placed Overnight concern 4/30  For potential aspiration as patient has been coughing for the past 2 months and is scheduled to have esophageal dilatation by [name] at some point We will obtain a stat CXR 1 view As the nephrostomy tube has come out, and he devleoped hypothermia while on IV abx,   I spoke to [name] of Urology who rec recommneds urgent Nephrostomy exchange-- D/w IR who rec's KUB and will see patient in consult for ? exchange [name] will see the patient in consult-much appreciate Get PCT and Lactic acid stat  #2 Acute respiratory distress-multifactorial-acute heart failur and possible Aspiration  secondary to a.fib with RVR and acute on chronic systolic CHF.  Concern also for Aspiration-Has esophageal  dysmotility significantly volume overloaded/anasarca Slow improvement and no O2 requirement 08/25/2014  #2 A. fib with RVR Likely secondary to sepsis. Patient on empiric IV antibiotics for Pseudomonas UTI.  Cycle cardiac enzymes every 6 hours 3. Patient had a 2-D echo done early on in this hospitalization which showed a EF= 30 to 35% with no wall motion abnormalities.  When INR is within the therapeutic window Coumadin will be resumed per PharmD Amio GTT to be continued p[er Cardiology input 5/1  #3 acute on chronic systolic heart failure Patient noted to be visibly heart failure. However blood pressure is borderline and difficult to diureses. Patient also in atrial fibrillation with RVR. We'll try to control heart rate. Hold off on diuretics at this time as blood pressure is in the high 80s.  I/o cumulative= +4.43 liters  #4 hypotension limiting diuresis and rate control  #7 right lower extremity cellulitis On IV cefepime.  D/c Doxy as wound to leg doesn't appear infected at this time [facility] nurse has seen patient  #8 acute on chronic renal failure Likely secondary to cardiorenal syndrome. Patient with a poor ejection fraction and currently in acute heart failure with A. fib with RVR.  Get Goals of care meeting by 5/1 if no improvmeent  #9 anemia Likely anemia of chronic disease. No overt bleeding. Follow H&H.  #10 Coagulopathy Patient's INR was greater than 10 on admission. INR still in 4 range. Hold Coumadin.  #11 thrombocytopenia Likely secondary to acute infection.  No overt bleeding. Follow H&H.  #12 right lower extremity wound Wound care consult appreciated Rec's=  4 cm x 2 cm wound bed is 100% eschar, separated from wound margins, boggy and  fluctuant  Cleanse right lateral lower leg with NS and pat gently dry. Apply Santyl ointment, 1/8 inch thickness (opaque). Top with NS moist dressing. Cover with 4x4 gauze and secure with kerlix and tape. Change daily.    #13  constipation Senokot-S.  #14 prophylaxis INR supratherapeutic at 4.05. Once INR becomes therapeutic Coumadin will be resumed per pharmacy for DVT prophylaxis.  Code Status: [name] has told me he wishes to be DNR--his prognosis at this stage is gaurded Family Communication: called [name] x 3 so far--no asnwer Called [name] to update change in status Disposition Plan: keep SDU    Consultants:  PCCM admit  Cardiology  Procedures:  2-D echo 08/20/2014  Chest x-ray 08/19/2014, 08/19/2014, 08/20/2014, 08/21/2014  X-ray of the hip 08/19/2014  X-ray of the tib-fib 08/19/2014 neg for acute fx, mild knee/ankle joint osteo, extensive peripheral vascular calcification, focal soft tissue swelling  Antibiotics:  IV cefepime 08/21/2014   IV Zosyn 07/28/2014>>>> 08/21/2014  IV vancomycin 08/24/2014>>>> 08/21/2014  Oral doxycycline 08/22/2014>>>08/23/14    HPI/Subjective:  Looks mor eill No n/v no cough overnight No CP Urostomy was leaking He denies any fever but states he felt "cold last night" as " no covers were on him   Objective: Filed Vitals:   08/25/14 0651  BP:   Pulse:   Temp: 97.6 F (36.4 C)  Resp:     Intake/Output Summary (Last 24 hours) at 08/25/14 0738 Last data filed at 08/25/14 0600  Gross per 24 hour  Intake 1507.1 ml  Output   1055 ml  Net  452.1 ml   Filed Weights   08/22/14 1324 08/24/14 0500 08/25/14 0455  Weight: 94.8 kg (208 lb 15.9 oz) 95.8 kg (211 lb 3.2 oz) 94.2 kg (207 lb 10.8 oz)    Exam:   General:  Using accessory muscles of respiration.  Cardiovascular: Irregularly irregular. +JVD       Respiratory: Bibasilar crackles.  ? AE R post lung fields-- Using accessory muscles of respiration.  Abdomen: Soft/NT/nd/+bs. NEPHROSTOMY TUBE bloody  Musculoskeletal: No c/c/. 3-4+ BLE edema to the hips. RLE with erythema.  Data Reviewed: Basic Metabolic Panel:  Recent Labs Lab 08/20/14 0406 08/21/14 0350 08/22/14 0543  08/23/14 0522 08/24/14 0350  NA 133* 134* 133* 132* 136  K 4.1 4.0 4.4 4.3 4.4  CL 105 106 106 107 107  CO2 18* 18* 17* 18* 17*  GLUCOSE 105* 113* 91 111* 116*  BUN 125* 110* 121* 111* 123*  CREATININE 4.02* 3.91* 3.78* 3.84* 3.63*  CALCIUM 7.7* 7.8* 8.0* 7.7* 8.3*   Liver Function Tests:  Recent Labs Lab 08/20/14 0406  AST 16  ALT 14  ALKPHOS 71  BILITOT 1.4*  PROT 6.0  ALBUMIN 2.7*   No results for input(s): LIPASE, AMYLASE in the last 168 hours. No results for input(s): AMMONIA in the last 168 hours. CBC:  Recent Labs Lab 08/03/2014 2058  08/20/14 0406 08/21/14 0350 08/22/14 0543 08/23/14 0522 08/24/14 0350  WBC 11.5*  < > 15.4* 8.7 7.2 7.3 8.2  NEUTROABS 10.3*  --   --   --   --  6.1 7.0  HGB 11.3*  < > 10.6* 9.9* 9.8* 9.4* 9.8*  HCT 34.6*  < > 32.3* 29.7* 30.6* 28.8* 29.9*  MCV 96.1  < > 94.4 94.9 95.0 94.1 94.0  PLT 107*  < > 127* 87* 88* 101* 107*  < > = values in this interval not displayed. Cardiac Enzymes:  Recent Labs Lab 08/19/14 0745 08/22/14 1230 08/22/14  1724 08/22/14 2320  CKTOTAL 87  --   --   --   TROPONINI  --  <0.03 0.03 0.03   BNP (last 3 results)  Recent Labs  08/21/14 0350 08/22/14 1230 08/23/14 0522  BNP 430.2* 647.7* 1201.9*    ProBNP (last 3 results)  Recent Labs  04/09/14 1038  PROBNP 9656.0*    CBG:  Recent Labs Lab 08/20/14 1225  GLUCAP 143*    Recent Results (from the past 240 hour(s))  Blood Culture (routine x 2)     Status: None   Collection Time: Sep 08, 2014  8:58 PM  Result Value Ref Range Status   Specimen Description BLOOD LEFT HAND  Final   Special Requests BOTTLES DRAWN AEROBIC ONLY 7CC  Final   Culture NO GROWTH 6 DAYS  Final   Report Status 08/24/2014 FINAL  Final  Blood Culture (routine x 2)     Status: None   Collection Time: 2014/09/08  9:05 PM  Result Value Ref Range Status   Specimen Description BLOOD LEFT HAND  Final   Special Requests BOTTLES DRAWN AEROBIC ONLY 7CC  Final   Culture NO  GROWTH 6 DAYS  Final   Report Status 08/24/2014 FINAL  Final  Urine culture     Status: None   Collection Time: 09-08-14 11:33 PM  Result Value Ref Range Status   Specimen Description URINE, CATHETERIZED  Final   Special Requests NONE  Final   Colony Count   Final    >=100,000 COLONIES/ML Performed at [facility]    Culture   Final    PSEUDOMONAS AERUGINOSA Performed at [facility]    Report Status 08/21/2014 FINAL  Final   Organism ID, Bacteria PSEUDOMONAS AERUGINOSA  Final      Susceptibility   Pseudomonas aeruginosa - MIC*    CEFEPIME 4 SENSITIVE Sensitive     CEFTAZIDIME 16 INTERMEDIATE Intermediate     CIPROFLOXACIN <=0.25 SENSITIVE Sensitive     GENTAMICIN <=1 SENSITIVE Sensitive     IMIPENEM 1 SENSITIVE Sensitive     TOBRAMYCIN <=1 SENSITIVE Sensitive     * PSEUDOMONAS AERUGINOSA  Urine culture     Status: None   Collection Time: 08/19/14  2:48 AM  Result Value Ref Range Status   Specimen Description URINE, SUPRAPUBIC  Final   Special Requests NONE  Final   Colony Count   Final    >=100,000 COLONIES/ML Performed at [facility]    Culture   Final    PSEUDOMONAS AERUGINOSA Performed at [facility]    Report Status 08/21/2014 FINAL  Final   Organism ID, Bacteria PSEUDOMONAS AERUGINOSA  Final      Susceptibility   Pseudomonas aeruginosa - MIC*    CEFEPIME 8 SENSITIVE Sensitive     CEFTAZIDIME 16 INTERMEDIATE Intermediate     CIPROFLOXACIN <=0.25 SENSITIVE Sensitive     GENTAMICIN <=1 SENSITIVE Sensitive     IMIPENEM 1 SENSITIVE Sensitive     TOBRAMYCIN <=1 SENSITIVE Sensitive     * PSEUDOMONAS AERUGINOSA  MRSA PCR Screening     Status: None   Collection Time: 08/19/14  3:11 AM  Result Value Ref Range Status   MRSA by PCR NEGATIVE NEGATIVE Final    Comment:        The GeneXpert MRSA Assay (FDA approved for NASAL specimens only), is one component of a comprehensive MRSA colonization surveillance program. It is  not intended to diagnose MRSA infection nor to guide or monitor treatment for  MRSA infections.      Studies: Dg Chest 1 View  08/23/2014   CLINICAL DATA:  Evaluate pleural effusion.  History of CHF.  EXAM: CHEST  1 VIEW  COMPARISON:  08/22/2014  FINDINGS: Previous median sternotomy and CABG procedure. Aortic atherosclerosis noted. The heart size is enlarged. There is a right pleural effusion which is unchanged in volume from previous exam. Mild interstitial edema is noted.  IMPRESSION: 1. Stable CHF pattern.   Electronically Signed   By: Signa Kell M.D.   On: 08/23/2014 14:01    Scheduled Meds: . ceFEPime (MAXIPIME) IV  1 g Intravenous Q24H  . collagenase   Topical Daily  . latanoprost  1 drop Both Eyes QHS  . sodium chloride  500 mL Intravenous Once  . sodium chloride  3 mL Intravenous Q12H  . vitamin B-12  1,000 mcg Oral Daily  . Warfarin - Pharmacist Dosing Inpatient   Does not apply q1800   Continuous Infusions: . sodium chloride 10 mL/hr at 08/21/14 1030  . amiodarone 30 mg/hr (08/25/14 0029)  . diltiazem (CARDIZEM) infusion Stopped (08/22/14 1148)    Principal Problem:   Acute respiratory failure Active Problems:   Chronic kidney disease, stage III (moderate)   Sepsis   Acute on chronic renal failure   Essential hypertension   Malnutrition of moderate degree   A-fib   Acute on chronic systolic heart failure   Cellulitis of leg, right   UTI (lower urinary tract infection)   Arterial hypotension   Cardiogenic shock   ADHF (acute decompensated heart failure)    Time spent: 30 MINS    Pleas Koch, MD [facility] (P) [phone]

## 2014-08-25 NOTE — Progress Notes (Signed)
Patient is A/Ox4 and is resting in bed. He is on room air and has no signs of respiratory distress. Spoke with cardiologist Dr.Koneswaran during a.m. Rounds was told it was okay to restart amiodarone infusion. Called for BP parameters for amiodarone. Amiodarone restarted. Informed attending MD about drainage from patient nephrostomy tube and pt.'s family request for swallow evaluation due to patient having swallowing issues at home. New orders were  Written. Paged then notified on call MD for [facility] cardiology Berge,NP about patient's MAP less than 60. MAP increased again to 60 and above. No new orders written.

## 2014-08-25 NOTE — Progress Notes (Signed)
ANTIBIOTIC CONSULT NOTE   Pharmacy Consult for cefepime Indication: pseudomonas UTI  Allergies  Allergen Reactions  . Sulfa Antibiotics Rash    Nausea    Patient Measurements: Height:  (was not able get pt. weight bed not operating) Weight: 207 lb 10.8 oz (94.2 kg) IBW/kg (Calculated) : 73  Vital Signs: Temp: 97.6 F (36.4 C) (05/01 0651) Temp Source: Oral (05/01 0651) BP: 87/59 mmHg (05/01 0500) Pulse Rate: 95 (05/01 0500) Intake/Output from previous day: 04/30 0701 - 05/01 0700 In: 1507.1 [P.O.:840; I.V.:617.1; IV Piggyback:50] Out: 1055 [Urine:1055] Intake/Output from this shift:    Labs:  Recent Labs  08/23/14 0522 08/24/14 0350  WBC 7.3 8.2  HGB 9.4* 9.8*  PLT 101* 107*  CREATININE 3.84* 3.63*   Estimated Creatinine Clearance: 16.8 mL/min (by C-G formula based on Cr of 3.63). No results for input(s): VANCOTROUGH, VANCOPEAK, VANCORANDOM, GENTTROUGH, GENTPEAK, GENTRANDOM, TOBRATROUGH, TOBRAPEAK, TOBRARND, AMIKACINPEAK, AMIKACINTROU, AMIKACIN in the last 72 hours.   Microbiology: Recent Results (from the past 720 hour(s))  Blood Culture (routine x 2)     Status: None   Collection Time: 08/17/2014  8:58 PM  Result Value Ref Range Status   Specimen Description BLOOD LEFT HAND  Final   Special Requests BOTTLES DRAWN AEROBIC ONLY 7CC  Final   Culture NO GROWTH 6 DAYS  Final   Report Status 08/24/2014 FINAL  Final  Blood Culture (routine x 2)     Status: None   Collection Time: 08/21/2014  9:05 PM  Result Value Ref Range Status   Specimen Description BLOOD LEFT HAND  Final   Special Requests BOTTLES DRAWN AEROBIC ONLY 7CC  Final   Culture NO GROWTH 6 DAYS  Final   Report Status 08/24/2014 FINAL  Final  Urine culture     Status: None   Collection Time: 08/02/2014 11:33 PM  Result Value Ref Range Status   Specimen Description URINE, CATHETERIZED  Final   Special Requests NONE  Final   Colony Count   Final    >=100,000 COLONIES/ML Performed at [facility]    Culture   Final    PSEUDOMONAS AERUGINOSA Performed at [facility]    Report Status 08/21/2014 FINAL  Final   Organism ID, Bacteria PSEUDOMONAS AERUGINOSA  Final      Susceptibility   Pseudomonas aeruginosa - MIC*    CEFEPIME 4 SENSITIVE Sensitive     CEFTAZIDIME 16 INTERMEDIATE Intermediate     CIPROFLOXACIN <=0.25 SENSITIVE Sensitive     GENTAMICIN <=1 SENSITIVE Sensitive     IMIPENEM 1 SENSITIVE Sensitive     TOBRAMYCIN <=1 SENSITIVE Sensitive     * PSEUDOMONAS AERUGINOSA  Urine culture     Status: None   Collection Time: 08/19/14  2:48 AM  Result Value Ref Range Status   Specimen Description URINE, SUPRAPUBIC  Final   Special Requests NONE  Final   Colony Count   Final    >=100,000 COLONIES/ML Performed at [facility]    Culture   Final    PSEUDOMONAS AERUGINOSA Performed at [facility]    Report Status 08/21/2014 FINAL  Final   Organism ID, Bacteria PSEUDOMONAS AERUGINOSA  Final      Susceptibility   Pseudomonas aeruginosa - MIC*    CEFEPIME 8 SENSITIVE Sensitive     CEFTAZIDIME 16 INTERMEDIATE Intermediate     CIPROFLOXACIN <=0.25 SENSITIVE Sensitive     GENTAMICIN <=1 SENSITIVE Sensitive     IMIPENEM 1 SENSITIVE Sensitive  TOBRAMYCIN <=1 SENSITIVE Sensitive     * PSEUDOMONAS AERUGINOSA  MRSA PCR Screening     Status: None   Collection Time: 08/19/14  3:11 AM  Result Value Ref Range Status   MRSA by PCR NEGATIVE NEGATIVE Final    Comment:        The GeneXpert MRSA Assay (FDA approved for NASAL specimens only), is one component of a comprehensive MRSA colonization surveillance program. It is not intended to diagnose MRSA infection nor to guide or monitor treatment for MRSA infections.    Assessment: 81 y.o M started on Vanc & Zosyn 4/24 for LE cellulitis and suspected sepsis.  UCX from 4/24 & 4/25 with Pseudomonas but sensitivity was not reported for zosyn.  I spoke to [facility] and they stated that  preliminary tests indicated that pseudomonas is resistant to zosyn but would have to do a second test to confirm.  D/W to Dr. Delton Coombes, ok to d/c zosyn and change to cefepime 4/27  Day 5/7 Cefepime  Clearance poor, unchanged  Plan:   Continue Cefepime 1gm IV q24h  Otho Bellows PharmD Pager [phone] 08/25/2014, 8:19 AM

## 2014-08-25 NOTE — Progress Notes (Signed)
IR PA aware of patient's admission and concern for right PCN being pulled back and bloody, INR supratherapeutic 4.43 D/w Dr. Loreta Ave and abdominal xray done and reviewed with adequate appearing position of right PCN. 70cc amber colored urine in bag, patient denies any pain at site.  Will schedule for routine right PCN exchange 5/2, consent signed.   Pattricia Boss PA-C Interventional Radiology  08/25/14  12:21 PM

## 2014-08-25 NOTE — Progress Notes (Signed)
Patients BP at 0600 was 83/45 (57). MD paged and amiodarone drip turned off; BP at 0630 was 77/47 (55); MD paged and patient given fluid bolus. Will continue to monitor.

## 2014-08-25 NOTE — Evaluation (Signed)
Clinical/Bedside Swallow Evaluation Patient Details  Name: [name] MRN: [id number] Date of Birth: 1926/01/09  Today's Date: 08/25/2014 Time: SLP Start Time (ACUTE ONLY): 1250 SLP Stop Time (ACUTE ONLY): 1310 SLP Time Calculation (min) (ACUTE ONLY): 20 min  Past Medical History:  Past Medical History  Diagnosis Date  . Hyperlipidemia   . White coat hypertension   . Cardiomyopathy      EF:30-40% in 06/1998, but 50-55% in 2009;  HX ISCHEMIC CARDIOMYOPATHY  . Chronic atrial fibrillation   . Vitamin B12 deficiency   . Chronic anticoagulation   . Arthritis   . Sensory neuropathy   . Chronic kidney disease, stage III (moderate)     creatinine 1.5 2001, 2.0 2009, 2.28 in 12/2009  . Mild renal insufficiency   . Hydronephrosis, bilateral   . Ureteral calculi     BILATERAL  . History of CHF (congestive heart failure)     1999  &  2005  . History of kidney stones   . Bilateral renal cysts   . 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 Chronic venous insufficiency   . PAD (peripheral artery disease)   . Edema of both legs   . History of gout     per pt stable  . Glaucoma of both eyes   . Wears hearing aid     bilateral  . CHF (congestive heart failure)   . Shortness of breath dyspnea   . Arrhythmia     Afib CHADS VASC Score of 5.   Past Surgical History:  Past Surgical History  Procedure Laterality Date  . Repair thoracic aorta  01/1995    s/p rupture  . Cataract extraction w/ intraocular lens  implant, bilateral  2012  . 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 Laparoscopic cholecystectomy  JAN 2005  . Percutaneous nephrostolithotomy  1970's  . Abdominal aortagram  07-16-2003  DR ROTHBART    W/ COIL EMBOLIZATION OF SIX BRANCHES OF RIGHT INTERNAL RENAL ARTERY ANEURYSM  . Cardiovascular stress test  04-25-2003     LOW RISK CARDIOLITE STUDY/ MODERATE LV DILATATION AND IMPAIRMENT OF LVSF/  NO ISCHEMIA  . Transthoracic echocardiogram  02-07-2008  DR ROTHBART    MODERATE DILATED LV/  EF 50-55%/ MILD AR/ MILD TO MODERATE AORTIC ROOT DILATATION/ MODERATE MR/ MODERATE LA   &  RA   DIILATED/ MILD TO MODERATE TR  . Cystoscopy w/ ureteral stent placement Bilateral 10/24/2012    Procedure: CYSTOSCOPY WITH RETROGRADE PYELOGRAM/URETERAL STENT PLACEMENT;  Surgeon: Lindaann Slough, MD;  Location: [facility];  Service: Urology;  Laterality: Bilateral;  . Cystoscopy with ureteroscopy Left 11/27/2012    Procedure: CYSTOSCOPY,LEFT URETEROSCOPY WITH LASER LITHOTRIPSY/REMOVAL OF MIGRATED STENT, PLACEMENT OF LEFT STENT;  Surgeon: Garnett Farm, MD;  Location: [facility];  Service: Urology;  Laterality: Left;  . Holmium laser application Left 11/27/2012    Procedure: HOLMIUM LASER APPLICATION;  Surgeon: Garnett Farm, MD;  Location: [facility];  Service: Urology;  Laterality: Left;  . Cystoscopy with ureteroscopy and stent placement N/A 12/29/2012    Procedure: CYSTOSCOPY WITH URETEROSCOPY AND STENT PLACEMENT, removal of right and left stents, retrogrades, right stent exchange;  Surgeon: Garnett Farm, MD;  Location: [facility];  Service: Urology;  Laterality: N/A;  . Holmium laser application Right 12/29/2012  Procedure: HOLMIUM LASER APPLICATION;  Surgeon: Garnett Farm, MD;  Location: [facility];  Service: Urology;  Laterality: Right;  HLL OF (RT) URETERAL PELVIC JUNCTION STONE  . Cystoscopy with retrograde pyelogram, ureteroscopy and stent placement Right 01/29/2013    Procedure: CYSTOSCOPY WITH RIGHT URETEROSCOPY AND STENT PLACEMENT;  Surgeon: Garnett Farm, MD;  Location: [facility];  Service: Urology;  Laterality: Right;  . Holmium laser application N/A 01/29/2013    Procedure: HOLMIUM LASER APPLICATION;  Surgeon: Garnett Farm, MD;  Location: [facility];  Service: Urology;  Laterality: N/A;  . Esophagogastroduodenoscopy N/A 02/16/2013     Procedure: ESOPHAGOGASTRODUODENOSCOPY (EGD);  Surgeon: Meryl Dare, MD;  Location: [facility];  Service: Endoscopy;  Laterality: N/A;  . Cystoscopy with retrograde pyelogram, ureteroscopy and stent placement Right 05/07/2013    Procedure: CYSTOSCOPY WITH RIGHT RETROGRADE PYELOGRAM, Balloon dilation of right ureter, Laser incision of right ureter, Nephrostogram;  Surgeon: Garnett Farm, MD;  Location: [facility];  Service: Urology;  Laterality: Right;  . Holmium laser application Right 05/07/2013    Procedure: HOLMIUM LASER APPLICATION;  Surgeon: Garnett Farm, MD;  Location: [facility];  Service: Urology;  Laterality: Right;  . Cystoscopy with retrograde pyelogram, ureteroscopy and stent placement Left 07/15/2014    Procedure: LEFT URETEROSCOPY RETROGRADE PYELOGRAM TRANSECTION OF BLADDER TUMOR WITH BLADDER LESION BIOPSIES;  Surgeon: Ihor Gully, MD;  Location: [facility];  Service: Urology;  Laterality: Left;   HPI:  81 y/o male with extensive PMH including Afib on coumadin, CKD III, chronic sCHF (EF 35%), PAD presented 4/24 to [facility] ED with c/o RLE wound with concern for ischemia. Also found to have acute on CKD, elevated lactate and borderline BP and was tx to [facility] to PCCM with ?sepsis.CXR 4/24-unchanged persistent chornic right pleural effusion. MD notes concern for aspiration with esophageal dysmotility.    Assessment / Plan / Recommendation Clinical Impression  Patient present with evidence of both an oropharyngeal and esophageal. Patient with c/o globus and regurgitation for which he has had appointments to be worked up for in the past, missing due to acute illnesses. Additionally however, patient with immediate cough response with thin liquids and intermittent wet vocal quality across all other po consistencies indicative of decreased airway protection. Cueing for small single bites/sips and intermittent throat clear combined with trials of softer solid boluses and thickened liquids  assisted to minimize but not eliminate signs of aspiration. Educated patient on rationale for diet adjustements, compensatory strategies, and f/u MBS. Patient in agreement.     Aspiration Risk  Moderate    Diet Recommendation Dysphagia 3 (Mech soft);Nectar   Medication Administration: Whole meds with liquid Compensations: Small sips/bites;Slow rate;Clear throat intermittently    Other  Recommendations Oral Care Recommendations: Oral care BID Other Recommendations: Order thickener from pharmacy;Prohibited food (jello, ice cream, thin soups);Remove water pitcher         Pertinent Vitals/Pain n/a         Swallow Study Prior Functional Status       General Other Pertinent Information: 81 y/o male with extensive PMH including Afib on coumadin, CKD III, chronic sCHF (EF 35%), PAD presented 4/24 to [facility] ED with c/o RLE wound with concern for ischemia. Also found to have acute on CKD, elevated lactate and borderline BP and was tx to [facility] to PCCM with ?sepsis.CXR 4/24-unchanged persistent chornic right pleural effusion. MD notes concern for aspiration with esophageal dysmotility.  Type of Study: Other (Comment) (bedside swallow evaluation) Previous  Swallow Assessment: none Diet Prior to this Study: Regular;Thin liquids Temperature Spikes Noted: No Respiratory Status: Room air History of Recent Intubation: No Behavior/Cognition: Alert;Cooperative;Pleasant mood Oral Cavity - Dentition: Adequate natural dentition/normal for age Self-Feeding Abilities: Able to feed self Patient Positioning: Upright in bed Baseline Vocal Quality: Wet Volitional Cough: Strong Volitional Swallow: Able to elicit    Oral/Motor/Sensory Function Overall Oral Motor/Sensory Function: Appears within functional limits for tasks assessed (except mild right sided lingual deviation)   Ice Chips Ice chips: Not tested   Thin Liquid Thin Liquid: Impaired Presentation: Cup;Self Fed Pharyngeal  Phase  Impairments: Cough - Immediate;Throat Clearing - Immediate    Nectar Thick Nectar Thick Liquid: Impaired Presentation: Cup;Self Fed Pharyngeal Phase Impairments: Wet Vocal Quality   Honey Thick Honey Thick Liquid: Not tested   Puree Puree: Within functional limits Presentation: Spoon   Solid   GO   Rogan Wigley MA, CCC-SLP [phone]  Solid: Impaired Presentation: Spoon Pharyngeal Phase Impairments: Wet Vocal Quality       Raelyn Racette Meryl 08/25/2014,1:15 PM

## 2014-08-25 NOTE — Progress Notes (Addendum)
Patient began to have weeping to his left upper arm this afternoon. He has been receiving continuing IV fluids. Attending MD was notified. New order written. Dressing gauze placed on left arm.

## 2014-08-25 NOTE — Progress Notes (Signed)
SUBJECTIVE: Denies chest pain and shortness of breath. Left leg pain>right. Hypotensive earlier along with a fever. Amiodarone drip d/c and IV fluid bolus administered.     Intake/Output Summary (Last 24 hours) at 08/25/14 0827 Last data filed at 08/25/14 0749  Gross per 24 hour  Intake 1480.4 ml  Output   1095 ml  Net  385.4 ml    Current Facility-Administered Medications  Medication Dose Route Frequency Provider Last Rate Last Dose  . 0.9 %  sodium chloride infusion   Intravenous Continuous Jeanella Craze, NP 10 mL/hr at 08/21/14 1030    . acetaminophen (TYLENOL) tablet 1,000 mg  1,000 mg Oral Q6H PRN Bernadene Person, NP   1,000 mg at 08/24/14 1044  . amiodarone (NEXTERONE PREMIX) 360 MG/200ML (1.8 mg/mL) IV infusion  30 mg/hr Intravenous Continuous Lupita Leash, MD 16.7 mL/hr at 08/25/14 0029 30 mg/hr at 08/25/14 0029  . ceFEPIme (MAXIPIME) 1 g in dextrose 5 % 50 mL IVPB  1 g Intravenous Q24H Lupita Leash, MD   1 g at 08/24/14 1834  . collagenase (SANTYL) ointment   Topical Daily Rhetta Mura, MD      . diltiazem (CARDIZEM) 100 mg in dextrose 5 % 100 mL (1 mg/mL) infusion  5-15 mg/hr Intravenous Continuous Rodolph Bong, MD   Stopped at 08/22/14 1148  . HYDROcodone-acetaminophen (NORCO/VICODIN) 5-325 MG per tablet 0.5-1 tablet  0.5-1 tablet Oral Q6H PRN Bernadene Person, NP   1 tablet at 08/25/14 0456  . latanoprost (XALATAN) 0.005 % ophthalmic solution 1 drop  1 drop Both Eyes QHS Bernadene Person, NP   1 drop at 08/24/14 2105  . senna-docusate (Senokot-S) tablet 1 tablet  1 tablet Oral Daily PRN Jeanella Craze, NP   1 tablet at 08/21/14 1028  . sodium chloride 0.9 % injection 3 mL  3 mL Intravenous Q12H Rhetta Mura, MD   3 mL at 08/24/14 2108  . vitamin B-12 (CYANOCOBALAMIN) tablet 1,000 mcg  1,000 mcg Oral Daily Bernadene Person, NP   1,000 mcg at 08/24/14 1038  . Warfarin - Pharmacist Dosing Inpatient   Does not apply q1800  Coralyn Helling, MD   Stopped at 08/21/14 1800    Filed Vitals:   08/25/14 0730 08/25/14 0745 08/25/14 0800 08/25/14 0817  BP: 82/55  Pulse: 80 103 110 154  Temp:      TempSrc:      Resp: Height:      Weight:      SpO2: 96% 87% 94% 94%    PHYSICAL EXAM General: ill-appearing, sitting upright. HEENT: EOMI. Neck: +JVD  Lungs: Diminished breath sounds b/l. CV: Tachycardic, irregular rhythm, normal S1/S2, no S3, no murmur. 3+ pitting pretibial edema b/l.  Abdomen: Soft, nontender,no distention.  Neurologic: Alert and oriented.  Psych: Normal affect. Musculoskeletal: No gross deformities. Extremities: No clubbing or cyanosis.   TELEMETRY: Reviewed telemetry pt in atrial fibrillation with PVC's (HR 105-115 bpm)  LABS: Basic Metabolic Panel:  Recent Labs  16/10/96 0522 08/24/14 0350  NA 132* 136  K 4.3 4.4  CL 107 107  CO2 18* 17*  GLUCOSE 111* 116*  BUN 111* 123*  CREATININE 3.84* 3.63*  CALCIUM 7.7* 8.3*   Liver Function Tests: No results for input(s): AST, ALT, ALKPHOS, BILITOT, PROT, ALBUMIN in the last 72 hours. No results for input(s): LIPASE, AMYLASE in the last 72 hours. CBC:  Recent Labs  08/23/14 0522 08/24/14 0350  WBC 7.3 8.2  NEUTROABS 6.1 7.0  HGB 9.4* 9.8*  HCT 28.8* 29.9*  MCV 94.1 94.0  PLT 101* 107*   Cardiac Enzymes:  Recent Labs  08/22/14 1230 08/22/14 1724 08/22/14 2320  TROPONINI <0.03 0.03 0.03   BNP: Invalid input(s): POCBNP D-Dimer: No results for input(s): DDIMER in the last 72 hours. Hemoglobin A1C: No results for input(s): HGBA1C in the last 72 hours. Fasting Lipid Panel: No results for input(s): CHOL, HDL, LDLCALC, TRIG, CHOLHDL, LDLDIRECT in the last 72 hours. Thyroid Function Tests: No results for input(s): TSH, T4TOTAL, T3FREE, THYROIDAB in the last 72 hours.  Invalid input(s): FREET3 Anemia Panel: No results for input(s): VITAMINB12, FOLATE, FERRITIN, TIBC, IRON, RETICCTPCT in  the last 72 hours.  RADIOLOGY: Dg Chest 1 View  08/23/2014   CLINICAL DATA:  Evaluate pleural effusion.  History of CHF.  EXAM: CHEST  1 VIEW  COMPARISON:  08/22/2014  FINDINGS: Previous median sternotomy and CABG procedure. Aortic atherosclerosis noted. The heart size is enlarged. There is a right pleural effusion which is unchanged in volume from previous exam. Mild interstitial edema is noted.  IMPRESSION: 1. Stable CHF pattern.   Electronically Signed   By: Signa Kell M.D.   On: 08/23/2014 14:01   Dg Chest 2 View  06-Sep-2014   CLINICAL DATA:  81 year old male with a history of leg swelling  EXAM: CHEST - 2 VIEW  COMPARISON:  08/02/2014  FINDINGS: Cardiomediastinal silhouette unchanged. Atherosclerotic calcifications of the aortic arch.  Surgical changes of median sternotomy.  Dense opacity at the right base persists, unchanged from prior. No left pleural effusion identified.  No pneumothorax.  No displaced fracture.  Unremarkable appearance of the upper abdomen.  IMPRESSION: Persisting right pleural effusion with associated atelectasis. Infection difficult to exclude.  Atherosclerosis with evidence of median sternotomy.  Signed,  Yvone Neu. Loreta Ave, DO  [facility]  [facility]   Electronically Signed   By: Gilmer Mor D.O.   On: Sep 06, 2014 21:39   Dg Chest 2 View  08/02/2014   CLINICAL DATA:  Pleural fluid.  Congestion.  EXAM: CHEST  2 VIEW  COMPARISON:  07/12/2014  FINDINGS: Large right pleural effusion has enlarged to about 50% of right hemithoracic volume, with associated passive atelectasis. Stable cardiomegaly although the right heart border is obscured.  Atherosclerotic aortic arch. Prior median sternotomy. Mild peripheral interstitial accentuation in the lungs, although improved from 07/12/2014.  IMPRESSION: 1. Enlarged right pleural effusion, now about 50% of right hemithoracic volume. 2. Very faint residual interstitial edema, best seen at the  left lung base. Most of the edema has resolved. 3. Cardiomegaly.   Electronically Signed   By: Gaylyn Rong M.D.   On: 08/02/2014 15:42   Dg Tibia/fibula Right  08/19/2014   CLINICAL DATA:  Right leg pain.  No known injury.  EXAM: RIGHT TIBIA AND FIBULA - 2 VIEW  COMPARISON:  None.  FINDINGS: Focal soft tissue swelling is seen along the anterior aspect of the tibial diaphysis. No evidence of fracture or other focal bone lesions.  Mild degenerative spurring is seen involving the patella and ankle joint. Extensive peripheral vascular calcification is noted.  IMPRESSION: Focal soft tissue swelling along the anterior aspect of the tibial diaphysis. No evidence of fracture.  Mild knee and ankle joint osteoarthritis.  Extensive peripheral vascular calcification.   Electronically Signed   By: Myles Rosenthal M.D.   On: 08/19/2014 14:39   Dg Chest [facility]  1 View  08/22/2014   CLINICAL DATA:  Shortness of Breath  EXAM: PORTABLE CHEST - 1 VIEW  COMPARISON:  August 21, 2014  FINDINGS: There is cardiomegaly with pulmonary venous hypertension. There is a right pleural effusion. There is interstitial edema throughout the lungs. There is no appreciable airspace consolidation. There is atelectatic change in the medial right base.  IMPRESSION: Findings consistent with congestive heart failure. Atelectasis medial right base, stable. No new opacity.   Electronically Signed   By: Bretta Bang III M.D.   On: 08/22/2014 12:12   Dg Chest Port 1 View  08/21/2014   CLINICAL DATA:  Pleural effusion.  EXAM: PORTABLE CHEST - 1 VIEW  COMPARISON:  08/15/2014.  FINDINGS: Mediastinum hilar structures stable. Prior median sternotomy. Stable cardiomegaly. Mild pulmonary vascular prominence and interstitial prominence suggesting mild congestive heart failure. Right pleural effusion again noted and unchanged. Tiny left pleural effusion cannot be excluded. No pneumothorax.  IMPRESSION: Findings consistent with mild congestive heart failure  with mild pulmonary interstitial edema and stable right pleural effusion. Tiny left pleural effusion cannot be excluded . Prior median sternotomy.   Electronically Signed   By: Maisie Fus  Register   On: 08/21/2014 07:14   Dg Chest Port 1 View  08/20/2014   CLINICAL DATA:  Pleural effusion.  Cardiomyopathy.  EXAM: PORTABLE CHEST - 1 VIEW  COMPARISON:  08/11/2014.  FINDINGS: The heart is enlarged. A moderate size right pleural effusion may be slightly increased this likely layering with patient at night angle. A small left pleural effusion is now evident. Mild edema has increased. Bibasilar airspace disease is noted. The visualized soft tissues and bony thorax are unremarkable.  IMPRESSION: 1. Congestive heart failure with progressive edema. 2. Stable slight increase in right pleural effusion. 3. New small left pleural effusion.   Electronically Signed   By: Marin Roberts M.D.   On: 08/20/2014 07:27   Dg Hip Unilat With Pelvis 1v Right  08/19/2014   CLINICAL DATA:  RIGHT hip pain.  EXAM: RIGHT HIP (WITH PELVIS) 1 VIEW  COMPARISON:  None.  FINDINGS: There is no evidence of hip fracture or dislocation. There is no evidence of arthropathy or other focal bone abnormality. Marked vascular calcification. Previous iliac stent placement with endovascular coiling.  IMPRESSION: No acute abnormality is evident.   Electronically Signed   By: Davonna Belling M.D.   On: 08/19/2014 14:39      ASSESSMENT AND PLAN: 81 year old with chronic systolic heart failure, ejection fraction 35%, persistent atrial fibrillation previously on chronic anticoagulation with Coumadin, persistent hypotension resulting in challenging control of atrial fibrillation, chronic kidney disease-no longer on digoxin.  1. Chronic systolic heart failure: Markedly volume overloaded with sepsis and hypotension with CKD stage 3, on antimicrobial therapy. I gave one dose of IV Lasix 40 mg on 4/30 with minimal output. Hypotensive earlier resulting in  administration of IV fluid bolus. He has marked lower extremity edema which has been a challenge to treat. I agree that ACE wraps/compression would be helpful if okay from a wound standpoint.   2. Atrial fibrillation, persistent: Amiodarone drip was d/c due to hypotension, had been on it 4/30. Once his BP tolerates, consider starting oral amiodarone 200 mg twice a day over the next 2 weeks and then reduce to 200 mg daily after that. Amiodarone is being utilized for rate control purposes. Diltiazem is ineffective because of hypotension. No digoxin because of chronic kidney disease. No Toprol because of hypotension.  3. Chronic anticoagulation: Continue with  Coumadin. INR supratherapeutic, likely related to antibiotic use.  4. Massive lower extremity edema, PA pressures of 50 mmHg systolic: This will continue to be challenging to treat, in light of hypotension related to sepsis. Likely severe chronic venous insufficiency is playing a role as well. Could try Ace wraps as noted above. Lasix has been challenging because of hypotension with minimal effect on 4/30.  5. Prognosis: Given his cardiomyopathy, massive edema, considerable hypotension, overall cardiovascular prognosis is poor over the next year. Trying to optimize medical therapy to the best of our ability.    Prentice Docker, M.D., F.A.C.C.

## 2014-08-25 NOTE — Progress Notes (Addendum)
[facility] PROGRESS NOTE  [name] WUJ:[id number] DOB: 1926/03/17 DOA: 08/04/2014 PCP: Edward South, MD  Brief narrative:  81 y/o ? Chr A.fib CHAD2 score 3 +prio AC now off 2/2 to Melena in the past, Stg III CKD, Post-obs Uropathy, Chr Systolic HF 35-40%, Prior L Hydronephrosis, prior nephrolithiasis 1980's-->s/p L nephrostomy placed 2015-- repair thoracic aneurysm 1995 and 1996 admitted by PCCM on 08/19/14 with Acute respiratory failure 2/2 to R pleural effusion + SIRS/Sepsis +m coagulopathy He was seen by Cardiology as well and found to have Chr Systolic HF with new-onset Afib this admission-? 2/2 to Tachyarrythmia related CM He has been diuresed per Cardiology but has a poor long-term prognosis given inability to use rate controlling agents as well as massive anasarca limiting diuretic use to control edema His Perc Neprhostomy seeemd to be more bloody overnight 4/30 and patient experienced hypotension requiring an IV saline bolus, Amio gtt was turned off.  Assessment/Plan:  #1 Sepsis Secondary to Pseudomonas UTI and right lower extremity cellulitis. Patient on empiric IV cefepime. Follow. On IV cefepime--Stop date placed Overnight concern 4/30  For potential aspiration as patient has been coughing for the past 2 months and is scheduled to have esophageal dilatation by [name] at some point We will obtain a stat CXR 1 view As the nephrostomy tube has come out, and he devleoped hypothermia while on IV abx,   I spoke to Dr. Berneice Orr of Urology who rec recommneds urgent Nephrostomy exchange-- D/w IR who rec's KUB and will see patient in consult for ? exchange Dr. Berneice Orr will see the patient in consult-much appreciate Get PCT and Lactic acid stat  #2 Acute respiratory distress-multifactorial-acute heart failur and possible Aspiration  secondary to a.fib with RVR and acute on chronic systolic CHF.  Concern also for Aspiration-Has esophageal dysmotility significantly  volume overloaded/anasarca Slow improvement and no O2 requirement 08/25/2014  #2 A. fib with RVR Likely secondary to sepsis. Patient on empiric IV antibiotics for Pseudomonas UTI.  Cycle cardiac enzymes every 6 hours 3. Patient had a 2-D echo done early on in this hospitalization which showed a EF= 30 to 35% with no wall motion abnormalities.  When INR is within the therapeutic window Coumadin will be resumed per PharmD Amio GTT to be continued p[er Cardiology input 5/1  #3 acute on chronic systolic heart failure Patient noted to be visibly heart failure. However blood pressure is borderline and difficult to diureses. Patient also in atrial fibrillation with RVR. We'll try to control heart rate. Hold off on diuretics at this time as blood pressure is in the high 80s.  I/o cumulative= +4.43 liters  #4 hypotension limiting diuresis and rate control  #7 right lower extremity cellulitis On IV cefepime.  D/c Doxy as wound to leg doesn't appear infected at this time [name] nurse has seen patient  #8 acute on chronic renal failure Likely secondary to cardiorenal syndrome. Patient with a poor ejection fraction and currently in acute heart failure with A. fib with RVR.  Get Goals of care meeting by 5/1 if no improvmeent  #9 anemia Likely anemia of chronic disease. No overt bleeding. Follow H&H.  #10 Coagulopathy Patient's INR was greater than 10 on admission. INR still in 4 range. Hold Coumadin.  #11 thrombocytopenia Likely secondary to acute infection.  No overt bleeding. Follow H&H.  #12 right lower extremity wound Wound care consult appreciated Rec's=  4 cm x 2 cm wound bed is 100% eschar, separated from wound margins, boggy and fluctuant  Cleanse right lateral lower leg with NS and pat gently dry. Apply Santyl ointment, 1/8 inch thickness (opaque). Top with NS moist dressing. Cover with 4x4 gauze and secure with kerlix and tape. Change daily.    #13  constipation Senokot-S.  #14 prophylaxis INR supratherapeutic at 4.05. Once INR becomes therapeutic Coumadin will be resumed per pharmacy for DVT prophylaxis.  Code Status: [name] has told me he wishes to be DNR--his prognosis at this stage is gaurded Family Communication: called granddaughter x 3 so far--no asnwer Called sister to update change in status Disposition Plan: keep SDU    Consultants:  PCCM admit  Cardiology  Procedures:  2-D echo 08/20/2014  Chest x-ray 08/07/2014, 08/19/2014, 08/20/2014, 08/21/2014  X-ray of the hip 08/19/2014  X-ray of the tib-fib 08/19/2014 neg for acute fx, mild knee/ankle joint osteo, extensive peripheral vascular calcification, focal soft tissue swelling  Antibiotics:  IV cefepime 08/21/2014   IV Zosyn 08/08/2014>>>> 08/21/2014  IV vancomycin 08/21/2014>>>> 08/21/2014  Oral doxycycline 08/22/2014>>>08/23/14    HPI/Subjective:  Looks mor eill No n/v no cough overnight No CP Urostomy was leaking He denies any fever but states he felt "cold last night" as " no covers were on him   Objective: Filed Vitals:   08/25/14 0817  BP: 82/55  Pulse: 154  Temp:   Resp: 21    Intake/Output Summary (Last 24 hours) at 08/25/14 0940 Last data filed at 08/25/14 0926  Gross per 24 hour  Intake 1343.7 ml  Output    835 ml  Net  508.7 ml   Filed Weights   08/22/14 1324 08/24/14 0500 08/25/14 0455  Weight: 94.8 kg (208 lb 15.9 oz) 95.8 kg (211 lb 3.2 oz) 94.2 kg (207 lb 10.8 oz)    Exam:   General:  Using accessory muscles of respiration.  Cardiovascular: Irregularly irregular. +JVD       Respiratory: Bibasilar crackles.  ? AE R post lung fields-- Using accessory muscles of respiration.  Abdomen: Soft/NT/nd/+bs. NEPHROSTOMY TUBE bloody  Musculoskeletal: No c/c/. 3-4+ BLE edema to the hips. RLE with erythema.  Data Reviewed: Basic Metabolic Panel:  Recent Labs Lab 08/20/14 0406 08/21/14 0350 08/22/14 0543  08/23/14 0522 08/24/14 0350  NA 133* 134* 133* 132* 136  K 4.1 4.0 4.4 4.3 4.4  CL 105 106 106 107 107  CO2 18* 18* 17* 18* 17*  GLUCOSE 105* 113* 91 111* 116*  BUN 125* 110* 121* 111* 123*  CREATININE 4.02* 3.91* 3.78* 3.84* 3.63*  CALCIUM 7.7* 7.8* 8.0* 7.7* 8.3*   Liver Function Tests:  Recent Labs Lab 08/20/14 0406  AST 16  ALT 14  ALKPHOS 71  BILITOT 1.4*  PROT 6.0  ALBUMIN 2.7*   No results for input(s): LIPASE, AMYLASE in the last 168 hours. No results for input(s): AMMONIA in the last 168 hours. CBC:  Recent Labs Lab 08/12/2014 2058  08/20/14 0406 08/21/14 0350 08/22/14 0543 08/23/14 0522 08/24/14 0350  WBC 11.5*  < > 15.4* 8.7 7.2 7.3 8.2  NEUTROABS 10.3*  --   --   --   --  6.1 7.0  HGB 11.3*  < > 10.6* 9.9* 9.8* 9.4* 9.8*  HCT 34.6*  < > 32.3* 29.7* 30.6* 28.8* 29.9*  MCV 96.1  < > 94.4 94.9 95.0 94.1 94.0  PLT 107*  < > 127* 87* 88* 101* 107*  < > = values in this interval not displayed. Cardiac Enzymes:  Recent Labs Lab 08/19/14 0745 08/22/14 1230 08/22/14 1724 08/22/14 2320  CKTOTAL 87  --   --   --   TROPONINI  --  <0.03 0.03 0.03   BNP (last 3 results)  Recent Labs  08/21/14 0350 08/22/14 1230 08/23/14 0522  BNP 430.2* 647.7* 1201.9*    ProBNP (last 3 results)  Recent Labs  04/09/14 1038  PROBNP 9656.0*    CBG:  Recent Labs Lab 08/20/14 1225  GLUCAP 143*    Recent Results (from the past 240 hour(s))  Blood Culture (routine x 2)     Status: None   Collection Time: Sep 12, 2014  8:58 PM  Result Value Ref Range Status   Specimen Description BLOOD LEFT HAND  Final   Special Requests BOTTLES DRAWN AEROBIC ONLY 7CC  Final   Culture NO GROWTH 6 DAYS  Final   Report Status 08/24/2014 FINAL  Final  Blood Culture (routine x 2)     Status: None   Collection Time: 2014/09/12  9:05 PM  Result Value Ref Range Status   Specimen Description BLOOD LEFT HAND  Final   Special Requests BOTTLES DRAWN AEROBIC ONLY 7CC  Final   Culture NO  GROWTH 6 DAYS  Final   Report Status 08/24/2014 FINAL  Final  Urine culture     Status: None   Collection Time: 09/12/14 11:33 PM  Result Value Ref Range Status   Specimen Description URINE, CATHETERIZED  Final   Special Requests NONE  Final   Colony Count   Final    >=100,000 COLONIES/ML Performed at [facility]    Culture   Final    PSEUDOMONAS AERUGINOSA Performed at [facility]    Report Status 08/21/2014 FINAL  Final   Organism ID, Bacteria PSEUDOMONAS AERUGINOSA  Final      Susceptibility   Pseudomonas aeruginosa - MIC*    CEFEPIME 4 SENSITIVE Sensitive     CEFTAZIDIME 16 INTERMEDIATE Intermediate     CIPROFLOXACIN <=0.25 SENSITIVE Sensitive     GENTAMICIN <=1 SENSITIVE Sensitive     IMIPENEM 1 SENSITIVE Sensitive     TOBRAMYCIN <=1 SENSITIVE Sensitive     * PSEUDOMONAS AERUGINOSA  Urine culture     Status: None   Collection Time: 08/19/14  2:48 AM  Result Value Ref Range Status   Specimen Description URINE, SUPRAPUBIC  Final   Special Requests NONE  Final   Colony Count   Final    >=100,000 COLONIES/ML Performed at [facility]    Culture   Final    PSEUDOMONAS AERUGINOSA Performed at [facility]    Report Status 08/21/2014 FINAL  Final   Organism ID, Bacteria PSEUDOMONAS AERUGINOSA  Final      Susceptibility   Pseudomonas aeruginosa - MIC*    CEFEPIME 8 SENSITIVE Sensitive     CEFTAZIDIME 16 INTERMEDIATE Intermediate     CIPROFLOXACIN <=0.25 SENSITIVE Sensitive     GENTAMICIN <=1 SENSITIVE Sensitive     IMIPENEM 1 SENSITIVE Sensitive     TOBRAMYCIN <=1 SENSITIVE Sensitive     * PSEUDOMONAS AERUGINOSA  MRSA PCR Screening     Status: None   Collection Time: 08/19/14  3:11 AM  Result Value Ref Range Status   MRSA by PCR NEGATIVE NEGATIVE Final    Comment:        The GeneXpert MRSA Assay (FDA approved for NASAL specimens only), is one component of a comprehensive MRSA colonization surveillance program. It is  not intended to diagnose MRSA infection nor to guide or monitor treatment for MRSA infections.  Studies: Dg Chest 1 View  08/23/2014   CLINICAL DATA:  Evaluate pleural effusion.  History of CHF.  EXAM: CHEST  1 VIEW  COMPARISON:  08/22/2014  FINDINGS: Previous median sternotomy and CABG procedure. Aortic atherosclerosis noted. The heart size is enlarged. There is a right pleural effusion which is unchanged in volume from previous exam. Mild interstitial edema is noted.  IMPRESSION: 1. Stable CHF pattern.   Electronically Signed   By: Signa Kell M.D.   On: 08/23/2014 14:01   Dg Chest Port 1 View  08/25/2014   CLINICAL DATA:  Cough with concern for aspiration  EXAM: PORTABLE CHEST - 1 VIEW  COMPARISON:  August 23, 2014  FINDINGS: There is stable cardiomegaly. Patient is status post median sternotomy. There is a right effusion which appears stable. There is trace interstitial edema, slightly less than on recent prior study. No new opacity. No adenopathy. Pulmonary vascularity within normal limits.  IMPRESSION: Trace interstitial edema, less than on recent prior study. Cardiomegaly with right effusion persist. No airspace consolidation. The appearance does suggest a degree of congestive heart failure, less than on recent prior study.   Electronically Signed   By: Bretta Bang III M.D.   On: 08/25/2014 09:19    Scheduled Meds: . ceFEPime (MAXIPIME) IV  1 g Intravenous Q24H  . collagenase   Topical Daily  . latanoprost  1 drop Both Eyes QHS  . sodium chloride  3 mL Intravenous Q12H  . vitamin B-12  1,000 mcg Oral Daily  . Warfarin - Pharmacist Dosing Inpatient   Does not apply q1800   Continuous Infusions: . sodium chloride 10 mL/hr at 08/25/14 0932  . amiodarone 30 mg/hr (08/25/14 0932)  . diltiazem (CARDIZEM) infusion Stopped (08/22/14 1148)    Principal Problem:   Acute respiratory failure Active Problems:   Chronic kidney disease, stage III (moderate)   Sepsis   Acute on  chronic renal failure   Essential hypertension   Malnutrition of moderate degree   A-fib   Acute on chronic systolic heart failure   Cellulitis of leg, right   UTI (lower urinary tract infection)   Arterial hypotension   Cardiogenic shock   ADHF (acute decompensated heart failure)    Time spent: 30 MINS    Pleas Koch, MD [facility] (P) [phone]

## 2014-08-25 NOTE — Consult Note (Signed)
Reason for Consult: Rt>Lt Ureteral Obstruction, Chronic Renal Failure, Pseudomnas Bacteruria / Fevers  Referring Physician: Verneita Griffes MD  [name] is an 81 y.o. male.   HPI:   1 - Rt>Lt Ureteral Obstruction / Malpositioned Rt Neph Tube- pt with long h/o nephrolithiasis s/p numerous procedures including ureteroscopy. Presently managed with Rt neph tueb for chornic Rt UPJ stone changed Q6 weeks, last changed 07/23/14. Pt noted to have anchor stitch of neph tube out and signifcantly more tube exposed than normal. KUB with distal curl still in place, but certainly concern for dislodgment. INR 4 on chronic coumadin.   2 - Chronic Renal Failure - CR 3-4 range for several years likley from combination medical renal disease and chronic obstructive changes  3 -  Pseudomnas Bacteruria / Fevers - low grade fevers this admission, UCX with pseudomonas sens cefipime (presently on now)  Today " Corky" is seen in consultation for above. He normally follows with Dr. Karsten Ro in our office. He is admitted for CHF exacerbation and presently on cardiac drips and diuresis.    Past Medical History  Diagnosis Date  . Hyperlipidemia   . White coat hypertension   . Cardiomyopathy      EF:30-40% in 06/1998, but 50-55% in 2009;  HX ISCHEMIC CARDIOMYOPATHY  . Chronic atrial fibrillation   . Vitamin B12 deficiency   . Chronic anticoagulation   . Arthritis   . Sensory neuropathy   . Chronic kidney disease, stage III (moderate)     creatinine 1.5 2001, 2.0 2009, 2.28 in 12/2009  . Mild renal insufficiency   . Hydronephrosis, bilateral   . Ureteral calculi     BILATERAL  . History of CHF (congestive heart failure)     1999  &  2005  . History of kidney stones   . Bilateral renal cysts   . Aneurysm of infrarenal abdominal aorta 3.9 x 3.6 no change 8/05 no change in 2010;  4.7 in 2013    [facility]-- LAST VISIT OCT 2013--  4.7CM  . Chronic venous insufficiency   . PAD (peripheral artery disease)    . Edema of both legs   . History of gout     per pt stable  . Glaucoma of both eyes   . Wears hearing aid     bilateral  . CHF (congestive heart failure)   . Shortness of breath dyspnea   . Arrhythmia     Afib CHADS VASC Score of 5.    Past Surgical History  Procedure Laterality Date  . Repair thoracic aorta  01/1995    s/p rupture  . Cataract extraction w/ intraocular lens  implant, bilateral  2012  . 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 Laparoscopic cholecystectomy  JAN 2005  . Percutaneous nephrostolithotomy  1970's  . Abdominal aortagram  07-16-2003  DR ROTHBART    W/ COIL EMBOLIZATION OF SIX BRANCHES OF RIGHT INTERNAL RENAL ARTERY ANEURYSM  . Cardiovascular stress test  04-25-2003    LOW RISK CARDIOLITE STUDY/ MODERATE LV DILATATION AND IMPAIRMENT OF LVSF/  NO ISCHEMIA  . Transthoracic echocardiogram  02-07-2008  DR ROTHBART    MODERATE DILATED LV/  EF 50-55%/ MILD AR/ MILD TO MODERATE AORTIC ROOT DILATATION/ MODERATE MR/ MODERATE LA   &  RA   DIILATED/ MILD TO MODERATE TR  . Cystoscopy w/ ureteral stent placement Bilateral 10/24/2012    Procedure: CYSTOSCOPY WITH RETROGRADE PYELOGRAM/URETERAL STENT PLACEMENT;  Surgeon: Kirtland Bouchard  Nesi, MD;  Location: [facility];  Service: Urology;  Laterality: Bilateral;  . Cystoscopy with ureteroscopy Left 11/27/2012    Procedure: CYSTOSCOPY,LEFT URETEROSCOPY WITH LASER LITHOTRIPSY/REMOVAL OF MIGRATED STENT, PLACEMENT OF LEFT STENT;  Surgeon: Claybon Jabs, MD;  Location: [facility];  Service: Urology;  Laterality: Left;  . Holmium laser application Left 05/31/971    Procedure: HOLMIUM LASER APPLICATION;  Surgeon: Claybon Jabs, MD;  Location: [facility];  Service: Urology;  Laterality: Left;  . Cystoscopy with ureteroscopy and stent placement N/A 12/29/2012    Procedure: CYSTOSCOPY WITH URETEROSCOPY AND STENT PLACEMENT, removal of right and left stents, retrogrades, right stent  exchange;  Surgeon: Claybon Jabs, MD;  Location: [facility];  Service: Urology;  Laterality: N/A;  . Holmium laser application Right 08/27/2990    Procedure: HOLMIUM LASER APPLICATION;  Surgeon: Claybon Jabs, MD;  Location: [facility];  Service: Urology;  Laterality: Right;  HLL OF (RT) URETERAL PELVIC JUNCTION STONE  . Cystoscopy with retrograde pyelogram, ureteroscopy and stent placement Right 01/29/2013    Procedure: CYSTOSCOPY WITH RIGHT URETEROSCOPY AND STENT PLACEMENT;  Surgeon: Claybon Jabs, MD;  Location: [facility];  Service: Urology;  Laterality: Right;  . Holmium laser application N/A 42/09/8339    Procedure: HOLMIUM LASER APPLICATION;  Surgeon: Claybon Jabs, MD;  Location: [facility];  Service: Urology;  Laterality: N/A;  . Esophagogastroduodenoscopy N/A 02/16/2013    Procedure: ESOPHAGOGASTRODUODENOSCOPY (EGD);  Surgeon: Ladene Artist, MD;  Location: [facility];  Service: Endoscopy;  Laterality: N/A;  . Cystoscopy with retrograde pyelogram, ureteroscopy and stent placement Right 05/07/2013    Procedure: CYSTOSCOPY WITH RIGHT RETROGRADE PYELOGRAM, Balloon dilation of right ureter, Laser incision of right ureter, Nephrostogram;  Surgeon: Claybon Jabs, MD;  Location: [facility];  Service: Urology;  Laterality: Right;  . Holmium laser application Right 9/62/2297    Procedure: HOLMIUM LASER APPLICATION;  Surgeon: Claybon Jabs, MD;  Location: [facility];  Service: Urology;  Laterality: Right;  . Cystoscopy with retrograde pyelogram, ureteroscopy and stent placement Left 07/15/2014    Procedure: LEFT URETEROSCOPY RETROGRADE PYELOGRAM TRANSECTION OF BLADDER TUMOR WITH BLADDER LESION BIOPSIES;  Surgeon: Kathie Rhodes, MD;  Location: [facility];  Service: Urology;  Laterality: Left;    Family History  Problem Relation Age of Onset  . Heart disease Mother   . Early death Father   . Aneurysm Brother   . Aortic aneurysm Brother   . Diabetes Sister     Social History:  reports that he has never smoked. He has  never used smokeless tobacco. He reports that he does not drink alcohol or use illicit drugs.  Allergies:  Allergies  Allergen Reactions  . Sulfa Antibiotics Rash    Nausea     Medications: I have reviewed the patient's current medications.  Results for orders placed or performed during the hospital encounter of 08/08/2014 (from the past 48 hour(s))  Protime-INR     Status: Abnormal   Collection Time: 08/24/14  3:50 AM  Result Value Ref Range   Prothrombin Time 41.7 (H) 11.6 - 15.2 seconds   INR 4.32 (H) 0.00 - 9.89  Basic metabolic panel     Status: Abnormal   Collection Time: 08/24/14  3:50 AM  Result Value Ref Range   Sodium 136 135 - 145 mmol/L   Potassium 4.4 3.5 - 5.1 mmol/L   Chloride 107 96 - 112 mmol/L   CO2 17 (L) 19 - 32 mmol/L   Glucose, Bld  116 (H) 70 - 99 mg/dL   BUN 123 (H) 6 - 23 mg/dL    Comment: RESULTS CONFIRMED BY MANUAL DILUTION   Creatinine, Ser 3.63 (H) 0.50 - 1.35 mg/dL   Calcium 8.3 (L) 8.4 - 10.5 mg/dL   GFR calc non Af Amer 14 (L) >90 mL/min   GFR calc Af Amer 16 (L) >90 mL/min    Comment: (NOTE) The eGFR has been calculated using the CKD EPI equation. This calculation has not been validated in all clinical situations. eGFR's persistently <90 mL/min signify possible Chronic Kidney Disease.    Anion gap 12 5 - 15  CBC with Differential/Platelet     Status: Abnormal   Collection Time: 08/24/14  3:50 AM  Result Value Ref Range   WBC 8.2 4.0 - 10.5 K/uL   RBC 3.18 (L) 4.22 - 5.81 MIL/uL   Hemoglobin 9.8 (L) 13.0 - 17.0 g/dL   HCT 29.9 (L) 39.0 - 52.0 %   MCV 94.0 78.0 - 100.0 fL   MCH 30.8 26.0 - 34.0 pg   MCHC 32.8 30.0 - 36.0 g/dL   RDW 20.0 (H) 11.5 - 15.5 %   Platelets 107 (L) 150 - 400 K/uL    Comment: CONSISTENT WITH PREVIOUS RESULT   Neutrophils Relative % 86 (H) 43 - 77 %   Lymphocytes Relative 7 (L) 12 - 46 %   Monocytes Relative 6 3 - 12 %   Eosinophils Relative 1 0 - 5 %   Basophils Relative 0 0 - 1 %   Neutro Abs 7.0 1.7 - 7.7  K/uL   Lymphs Abs 0.6 (L) 0.7 - 4.0 K/uL   Monocytes Absolute 0.5 0.1 - 1.0 K/uL   Eosinophils Absolute 0.1 0.0 - 0.7 K/uL   Basophils Absolute 0.0 0.0 - 0.1 K/uL   Smear Review MORPHOLOGY UNREMARKABLE   Protime-INR     Status: Abnormal   Collection Time: 08/25/14  4:03 AM  Result Value Ref Range   Prothrombin Time 42.5 (H) 11.6 - 15.2 seconds   INR 4.43 (H) 0.00 - 1.49    Dg Chest 1 View  08/23/2014   CLINICAL DATA:  Evaluate pleural effusion.  History of CHF.  EXAM: CHEST  1 VIEW  COMPARISON:  08/22/2014  FINDINGS: Previous median sternotomy and CABG procedure. Aortic atherosclerosis noted. The heart size is enlarged. There is a right pleural effusion which is unchanged in volume from previous exam. Mild interstitial edema is noted.  IMPRESSION: 1. Stable CHF pattern.   Electronically Signed   By: Kerby Moors M.D.   On: 08/23/2014 14:01   Dg Chest Port 1 View  08/25/2014   CLINICAL DATA:  Cough with concern for aspiration  EXAM: PORTABLE CHEST - 1 VIEW  COMPARISON:  August 23, 2014  FINDINGS: There is stable cardiomegaly. Patient is status post median sternotomy. There is a right effusion which appears stable. There is trace interstitial edema, slightly less than on recent prior study. No new opacity. No adenopathy. Pulmonary vascularity within normal limits.  IMPRESSION: Trace interstitial edema, less than on recent prior study. Cardiomegaly with right effusion persist. No airspace consolidation. The appearance does suggest a degree of congestive heart failure, less than on recent prior study.   Electronically Signed   By: Lowella Grip III M.D.   On: 08/25/2014 09:19    Review of Systems  Constitutional: Positive for fever and malaise/fatigue.  HENT: Negative.   Eyes: Negative.   Respiratory: Negative.   Cardiovascular: Positive for leg  swelling. Negative for chest pain.  Gastrointestinal: Negative.   Genitourinary: Negative.  Negative for hematuria and flank pain.   Musculoskeletal: Negative.   Skin: Negative.   Neurological: Positive for weakness.  Endo/Heme/Allergies: Negative.   Psychiatric/Behavioral: Negative.    Blood pressure 82/55, pulse 154, temperature 97.7 F (36.5 C), temperature source Oral, resp. rate 21, height $RemoveBe'5\' 10"'[id number]$  (1.778 m), weight 94.2 kg (207 lb 10.8 oz), SpO2 94 %. Physical Exam  Constitutional: He is oriented to person, place, and time.  Ill-appearing  HENT:  Head: Normocephalic.  Eyes: Pupils are equal, round, and reactive to light.  Neck: Normal range of motion.  Cardiovascular:  Irregular tachycardia by bedside monitor  Respiratory: Effort normal.  GI: Soft.  Genitourinary:  Rt neph tube c/d/i with some dark urine in bag, scant, The tube appears to be pulled back / dislodged. Severe penoscrotal edema as extension LE edema.   Musculoskeletal: He exhibits edema.  Severe LE edema, pitting, with venous stasis ulcers Rt>Lt that are non foul  Neurological: He is alert and oriented to person, place, and time.  Skin: Skin is warm.  Psychiatric: He has a normal mood and affect.    Assessment/Plan:  1 - Rt>Lt Ureteral Obstruction / Malpositioned Rt Neph Tube- certainly concern for neph tube dislodgment. Pt scheudled for change next week. Given his use of anticoagulation rec change sooner (today / tomorrow) if possible as tract likely still viable and exchange likely possible with goal of avoiding fresh stick which would require stopping his anticoagulation. Discussed with primary team.   2 - Chronic Renal Failure - GFR poor, but at baseline. Rt neph tube change as per above.   3 -  Pseudomnas Bacteruria / Fevers - agree with empiric treatment as fevers present and this certainly may represent source.   4 - I will make Dr. Karsten Ro aware of pt's admission, andhe will resume urologic care tomorrow, call with questions.   Cartrell Bentsen 08/25/2014, 9:37 AM

## 2014-08-25 NOTE — Progress Notes (Signed)
Patient's urine has changed from light brown to red. RN notified MD. MD instructed RN to pass message along to day MD. Will continue to monitor.

## 2014-08-25 NOTE — Progress Notes (Signed)
PULMONARY / CRITICAL CARE MEDICINE   Name: [name] MRN: [id number] DOB: [birth year]    ADMISSION DATE:  09-03-2014  REFERRING MD :  Jeani Hawking EDP  CHIEF COMPLAINT:  Sepsis   INITIAL PRESENTATION: 81 y/o male with extensive PMH including Afib on coumadin, CKD III, chronic sCHF (EF 35%), PAD presented 4/24 to [facility] ED with c/o RLE wound with concern for ischemia.  Also found to have acute on CKD, elevated lactate and borderline BP and was tx to [facility] to PCCM with ?sepsis.  Of note pt has been clear in the past that he would never want dialysis.   STUDIES:  CXR 4/24 >> unchanged persistent/chronic R pleural effusion. Hip XR 4/25 >> neg Tib/Fib XR 4/25 >> neg for acute fx, mild knee/ankle joint osteo, extensive peripheral vascular calcification, focal soft tissue swelling  ECHO 4/26 >> mod LV dilation, systolic function severely reduced, EF 30-35%, no RWMA, mod AR, mod MR, RA severely dilated, PA peak 50  SIGNIFICANT EVENTS: 4/24  Admit with hypotension, BLE pain, acute on chronic CKD, elevated lactate, hypotension  4/25  Remains on 10 mcg levo, BP in 70's with excellent mental status 4/26  Neosynephrine gtt 4/27  Weaned off neo, up to chair 4/28 back to ICU hypotension, afib 5/1 reconsulted for hypotension   SUBJECTIVE:  BP soft but denies dizziness or chest pain Hypothermic overnight  VITAL SIGNS: Temp:  [95.8 F (35.4 C)-98.7 F (37.1 C)] 97.7 F (36.5 C) (05/01 0800) Pulse Rate:  [74-154] 154 (05/01 0817) Resp:  [15-28] 21 (05/01 0817) BP: (78-127)/(49-76) 82/55 mmHg (05/01 0817) SpO2:  [87 %-100 %] 94 % (05/01 0817) Weight:  [207 lb 10.8 oz (94.2 kg)] 207 lb 10.8 oz (94.2 kg) (05/01 0455)   HEMODYNAMICS:     VENTILATOR SETTINGS:     INTAKE / OUTPUT:  Intake/Output Summary (Last 24 hours) at 08/25/14 0951 Last data filed at 08/25/14 0926  Gross per 24 hour  Intake 1343.7 ml  Output    835 ml  Net  508.7 ml    PHYSICAL EXAMINATION: General:   Sitting up in bed, no distress HENT: NCAT EOMi Eyes: EOMi, anicteric PULM: Few crackles bases, normal effort CV: Irreg irreg, JVD elevated again GI: BS+, soft Ext: massive edema slightly improved Derm: redness in legs improved, hematoma dressed Neuro - interactive, non focal  LABS:  CBC  Recent Labs Lab 08/22/14 0543 08/23/14 0522 08/24/14 0350  WBC 7.2 7.3 8.2  HGB 9.8* 9.4* 9.8*  HCT 30.6* 28.8* 29.9*  PLT 88* 101* 107*   Coag's  Recent Labs Lab 08/23/14 0522 08/24/14 0350 08/25/14 0403  INR 4.48* 4.32* 4.43*   BMET  Recent Labs Lab 08/22/14 0543 08/23/14 0522 08/24/14 0350  NA 133* 132* 136  K 4.4 4.3 4.4  CL 106 107 107  CO2 17* 18* 17*  BUN 121* 111* 123*  CREATININE 3.78* 3.84* 3.63*  GLUCOSE 91 111* 116*   Electrolytes  Recent Labs Lab 08/22/14 0543 08/23/14 0522 08/24/14 0350  CALCIUM 8.0* 7.7* 8.3*   Sepsis Markers  Recent Labs Lab 2014-09-03 2123 08/19/14 1155 08/22/14 1200  LATICACIDVEN 2.27* 2.0 1.4   Liver Enzymes  Recent Labs Lab 08/20/14 0406  AST 16  ALT 14  ALKPHOS 71  BILITOT 1.4*  ALBUMIN 2.7*   Glucose  Recent Labs Lab 08/20/14 1225  GLUCAP 143*    Imaging No results found.   ASSESSMENT / PLAN:  Chronic bilateral effusions due to CHF Acute pulmonary  edema due to acute decompensated heart failure > resolved Concernf or aspiration - esophageal dysmotility P: Supplemental O2 as needed for sats > 90% Pulmonary hygiene: IS, mobilize  Severe sepsis> resolved Shock 4/28 AM> likely cardiogenic complicated by diltiazem, improved don't think this is sepsis Acute systolic heart failure EF 30-35%  AFib with RVR PVD P:  Continue IV amiodarone per cardiology No dilt  Acute on CKD III - baseline Scr ~2.6, stable prior nephrolithiasis 1980's-->s/p L nephrostomy placed 2015 P:   Pt would NOT want dialysis  Monitor BMET and UOP Replace electrolytes as needed  Coagulopathy due to warfarin, antibiotics -  INR had improved pos  Thrombocytopenia - S/p FFP, Vit K  P:  Trend CBC, Coags Coumadin per pharmacy  ID Hypotension on 4/28 > think this is cardiogenic as his WBC is down, his is afebrile BLE Cellulitis > improved RLE Wound - injured with leg bag Pseudomonas UTI  P:   BCx2 4/24 >>ng UC 4/24 >> pseudomonas aeruginosa >> intermediate to ceftazidime, otherwise sensitive   Vancomycin 4/25>>>4/27 Zosyn 4/25>>>4/27 Cefepime 4/27 >> Doxycycline 4/28 >>   Doxycycline per [facility] Wound care consulted for RLE hematoma/wound Urology/ IR to replace nephrostomy  Neuro Deconditioning   P: PT consult for deconditioning, hx of falls  Activity as tolerated   CODE STATUS DNR, see discussion from 4/28  Family : updated sister on 4/28 by phone  Today's summary: Soft BP, still in Afib with RVR, amio restarted; ct Cefepime, based on priro discussions - no pressors.  PCCM available as needed  Oretha Milch MD

## 2014-08-25 NOTE — Progress Notes (Signed)
ANTICOAGULATION CONSULT NOTE - Follow up  Pharmacy Consult for warfarin Indication: atrial fibrillation  Allergies  Allergen Reactions  . Sulfa Antibiotics Rash    Nausea    Patient Measurements: Height:  (was not able get pt. weight bed not operating) Weight: 207 lb 10.8 oz (94.2 kg) IBW/kg (Calculated) : 73  Vital Signs: Temp: 97.6 F (36.4 C) (05/01 0651) Temp Source: Oral (05/01 0651) BP: 82/55 mmHg (05/01 0817) Pulse Rate: 154 (05/01 0817)  Labs:  Recent Labs  08/22/14 1230 08/22/14 1724 08/22/14 2320 08/23/14 0522 08/24/14 0350 08/25/14 0403  HGB  --   --   --  9.4* 9.8*  --   HCT  --   --   --  28.8* 29.9*  --   PLT  --   --   --  101* 107*  --   LABPROT  --   --   --  42.9* 41.7* 42.5*  INR  --   --   --  4.48* 4.32* 4.43*  CREATININE  --   --   --  3.84* 3.63*  --   TROPONINI <0.03 0.03 0.03  --   --   --    Estimated Creatinine Clearance: 16.8 mL/min (by C-G formula based on Cr of 3.63).  Medications:  Scheduled:  . ceFEPime (MAXIPIME) IV  1 g Intravenous Q24H  . collagenase   Topical Daily  . latanoprost  1 drop Both Eyes QHS  . sodium chloride  3 mL Intravenous Q12H  . vitamin B-12  1,000 mcg Oral Daily  . Warfarin - Pharmacist Dosing Inpatient   Does not apply q1800   Assessment: 81yo M presented to [facility] w/ leg swelling, suspected cellulitis, elev SCr, and supratherapeutic INR. Pharmacy was consulted to dose warfarin. Recent course of Vantin could have contributed to supratherapeutic INR. Denies significant changes in dietary intake. I confirmed his most recent warfarin regimen with him: 2.5mg  daily except nothing on Wednesdays.  Significant events: 4/25: INR improved to 3.12 after vit.K 10mg  and FFP.  4/26: INR dropped to 2. Gave warf 2.5mg . Started amiodarone. 4/27: INR took large jump, now slightly above therapeutic range 4/28: INR increased further, Doxycycline added- d/c 4/29, Amiodarone started, Diltiazem started/stopped 5/1: INR remains  elevated, Amiodarone stopped for low BP, Hematuria noted  Today, 08/25/2014:  INR continues supratherapeutic, poor po intake on Heart Healthy diet, Amiodarone and Doxycycline can increase INR  H/H and platelets low, stable. No bleeding reported/documented.  Amiodarone will increase sensitivity to warfarin as it reaches steady-state over the next couple of weeks.   Goal of Therapy:  INR 2-3   Plan:  Suggest repeat Vitamin K 10mg  IVPB today No warfarin today.  Check PT/INR daily.  Otho Bellows PharmD Pager [phone] 08/25/2014, 8:27 AM

## 2014-08-25 DEATH — deceased

## 2014-08-26 ENCOUNTER — Encounter (HOSPITAL_COMMUNITY): Payer: Self-pay | Admitting: Cardiology

## 2014-08-26 ENCOUNTER — Inpatient Hospital Stay (HOSPITAL_COMMUNITY): Payer: Medicare Other

## 2014-08-26 LAB — COMPREHENSIVE METABOLIC PANEL
ALT: 13 U/L — ABNORMAL LOW (ref 17–63)
AST: 17 U/L (ref 15–41)
Albumin: 2.3 g/dL — ABNORMAL LOW (ref 3.5–5.0)
Alkaline Phosphatase: 80 U/L (ref 38–126)
Anion gap: 8 (ref 5–15)
BILIRUBIN TOTAL: 0.7 mg/dL (ref 0.3–1.2)
BUN: 121 mg/dL — ABNORMAL HIGH (ref 6–20)
CALCIUM: 7.9 mg/dL — AB (ref 8.9–10.3)
CHLORIDE: 107 mmol/L (ref 101–111)
CO2: 16 mmol/L — ABNORMAL LOW (ref 22–32)
Creatinine, Ser: 3.52 mg/dL — ABNORMAL HIGH (ref 0.61–1.24)
GFR calc Af Amer: 17 mL/min — ABNORMAL LOW (ref >60)
GFR calc non Af Amer: 14 mL/min — ABNORMAL LOW (ref >60)
Glucose, Bld: 103 mg/dL — ABNORMAL HIGH (ref 70–99)
POTASSIUM: 4.6 mmol/L (ref 3.5–5.1)
Sodium: 131 mmol/L — ABNORMAL LOW (ref 135–145)
TOTAL PROTEIN: 5.8 g/dL — AB (ref 6.5–8.1)

## 2014-08-26 LAB — CBC WITH DIFFERENTIAL/PLATELET
BASOS PCT: 0 % (ref 0–1)
Basophils Absolute: 0 10*3/uL (ref 0.0–0.1)
Eosinophils Absolute: 0.1 10*3/uL (ref 0.0–0.7)
Eosinophils Relative: 2 % (ref 0–5)
HCT: 28.3 % — ABNORMAL LOW (ref 39.0–52.0)
HEMOGLOBIN: 9.1 g/dL — AB (ref 13.0–17.0)
Lymphocytes Relative: 6 % — ABNORMAL LOW (ref 12–46)
Lymphs Abs: 0.5 10*3/uL — ABNORMAL LOW (ref 0.7–4.0)
MCH: 30.2 pg (ref 26.0–34.0)
MCHC: 32.2 g/dL (ref 30.0–36.0)
MCV: 94 fL (ref 78.0–100.0)
Monocytes Absolute: 0.5 10*3/uL (ref 0.1–1.0)
Monocytes Relative: 6 % (ref 3–12)
NEUTROS PCT: 86 % — AB (ref 43–77)
Neutro Abs: 7.6 10*3/uL (ref 1.7–7.7)
Platelets: 124 10*3/uL — ABNORMAL LOW (ref 150–400)
RBC: 3.01 MIL/uL — ABNORMAL LOW (ref 4.22–5.81)
RDW: 20 % — ABNORMAL HIGH (ref 11.5–15.5)
WBC: 8.8 10*3/uL (ref 4.0–10.5)

## 2014-08-26 LAB — PROCALCITONIN: Procalcitonin: 0.44 ng/mL

## 2014-08-26 LAB — PROTIME-INR
INR: 2.55 — AB (ref 0.00–1.49)
Prothrombin Time: 27.6 seconds — ABNORMAL HIGH (ref 11.6–15.2)

## 2014-08-26 MED ORDER — DIPHENHYDRAMINE HCL 25 MG PO CAPS
25.0000 mg | ORAL_CAPSULE | Freq: Every evening | ORAL | Status: DC | PRN
  Administered 2014-08-26 – 2014-08-30 (×3): 25 mg via ORAL
  Filled 2014-08-26 (×4): qty 1

## 2014-08-26 MED ORDER — CHLORHEXIDINE GLUCONATE 0.12 % MT SOLN: 15.0000 mL | Freq: Two times a day (BID) | OROMUCOSAL | Status: DC

## 2014-08-26 MED ORDER — WARFARIN 0.5 MG HALF TABLET
0.5000 mg | ORAL_TABLET | Freq: Once | ORAL | Status: AC
  Administered 2014-08-26: 0.5 mg via ORAL
  Filled 2014-08-26: qty 1

## 2014-08-26 MED ORDER — CETYLPYRIDINIUM CHLORIDE 0.05 % MT LIQD
7.0000 mL | Freq: Two times a day (BID) | OROMUCOSAL | Status: DC
  Administered 2014-08-26: 7 mL via OROMUCOSAL

## 2014-08-26 NOTE — Progress Notes (Signed)
PT Cancellation Note  Patient Details Name: COIT FILKINS MRN: [id number] DOB: [birth year]   Cancelled Treatment:    Reason Eval/Treat Not Completed: Patient at procedure or test/unavailable (pt about to work with SLP)   Maida Sale E 08/26/2014, 1:46 PM Zenovia Jarred, PT, DPT 08/26/2014 Pager: [phone]

## 2014-08-26 NOTE — Progress Notes (Signed)
Subjective: Patient reports no flank pain.  He had no specific complaints other than the swelling of his lower extremities.  Objective: Vital signs in last 24 hours: Temp:  [97.5 F (36.4 C)-98 F (36.7 C)] 97.5 F (36.4 C) (05/02 1131) Pulse Rate:  [26-152] 116 (05/02 1100) Resp:  [14-23] 22 (05/02 1100) BP: (69-98)/(26-66) 84/50 mmHg (05/02 1100) SpO2:  [94 %-100 %] 98 % (05/02 1100) Weight:  [96 kg (211 lb 10.3 oz)] 96 kg (211 lb 10.3 oz) (05/02 0500)  Intake/Output from previous day: 05/01 0701 - 05/02 0700 In: 1208.5 [P.O.:540; I.V.:618.5; IV Piggyback:50] Out: 610 [Urine:610] Intake/Output this shift: Total I/O In: 296.4 [P.O.:240; I.V.:56.4] Out: 151 [Urine:150; Stool:1]  Physical Exam:  His nephrostomy tube appears to be draining clear urine with an acceptable output.  Lab Results:  Recent Labs  08/24/14 0350 08/25/14 0933 08/26/14 0350  HGB 9.8* 9.5* 9.1*  HCT 29.9* 29.5* 28.3*   BMET  Recent Labs  08/25/14 0933 08/26/14 0350  NA 134* 131*  K 4.5 4.6  CL 109 107  CO2 18* 16*  GLUCOSE 87 103*  BUN 118* 121*  CREATININE 3.55* 3.52*  CALCIUM 7.9* 7.9*    Recent Labs  08/24/14 0350 08/25/14 0403 08/26/14 0350  INR 4.32* 4.43* 2.55*   No results for input(s): LABURIN in the last 72 hours. Results for orders placed or performed during the hospital encounter of 08/23/2014  Blood Culture (routine x 2)     Status: None   Collection Time: 08/20/2014  8:58 PM  Result Value Ref Range Status   Specimen Description BLOOD LEFT HAND  Final   Special Requests BOTTLES DRAWN AEROBIC ONLY 7CC  Final   Culture NO GROWTH 6 DAYS  Final   Report Status 08/24/2014 FINAL  Final  Blood Culture (routine x 2)     Status: None   Collection Time: 08/04/2014  9:05 PM  Result Value Ref Range Status   Specimen Description BLOOD LEFT HAND  Final   Special Requests BOTTLES DRAWN AEROBIC ONLY 7CC  Final   Culture NO GROWTH 6 DAYS  Final   Report Status 08/24/2014 FINAL   Final  Urine culture     Status: None   Collection Time: 08/17/2014 11:33 PM  Result Value Ref Range Status   Specimen Description URINE, CATHETERIZED  Final   Special Requests NONE  Final   Colony Count   Final    >=100,000 COLONIES/ML Performed at [facility]    Culture   Final    PSEUDOMONAS AERUGINOSA Performed at [facility]    Report Status 08/21/2014 FINAL  Final   Organism ID, Bacteria PSEUDOMONAS AERUGINOSA  Final      Susceptibility   Pseudomonas aeruginosa - MIC*    CEFEPIME 4 SENSITIVE Sensitive     CEFTAZIDIME 16 INTERMEDIATE Intermediate     CIPROFLOXACIN <=0.25 SENSITIVE Sensitive     GENTAMICIN <=1 SENSITIVE Sensitive     IMIPENEM 1 SENSITIVE Sensitive     TOBRAMYCIN <=1 SENSITIVE Sensitive     * PSEUDOMONAS AERUGINOSA  Urine culture     Status: None   Collection Time: 08/19/14  2:48 AM  Result Value Ref Range Status   Specimen Description URINE, SUPRAPUBIC  Final   Special Requests NONE  Final   Colony Count   Final    >=100,000 COLONIES/ML Performed at [facility]    Culture   Final    PSEUDOMONAS AERUGINOSA Performed at [facility]  Report Status 08/21/2014 FINAL  Final   Organism ID, Bacteria PSEUDOMONAS AERUGINOSA  Final      Susceptibility   Pseudomonas aeruginosa - MIC*    CEFEPIME 8 SENSITIVE Sensitive     CEFTAZIDIME 16 INTERMEDIATE Intermediate     CIPROFLOXACIN <=0.25 SENSITIVE Sensitive     GENTAMICIN <=1 SENSITIVE Sensitive     IMIPENEM 1 SENSITIVE Sensitive     TOBRAMYCIN <=1 SENSITIVE Sensitive     * PSEUDOMONAS AERUGINOSA  MRSA PCR Screening     Status: None   Collection Time: 08/19/14  3:11 AM  Result Value Ref Range Status   MRSA by PCR NEGATIVE NEGATIVE Final    Comment:        The GeneXpert MRSA Assay (FDA approved for NASAL specimens only), is one component of a comprehensive MRSA colonization surveillance program. It is not intended to diagnose MRSA infection nor to guide or monitor  treatment for MRSA infections.     Studies/Results: Dg Chest Port 1 View  08/25/2014   CLINICAL DATA:  Cough with concern for aspiration  EXAM: PORTABLE CHEST - 1 VIEW  COMPARISON:  August 23, 2014  FINDINGS: There is stable cardiomegaly. Patient is status post median sternotomy. There is a right effusion which appears stable. There is trace interstitial edema, slightly less than on recent prior study. No new opacity. No adenopathy. Pulmonary vascularity within normal limits.  IMPRESSION: Trace interstitial edema, less than on recent prior study. Cardiomegaly with right effusion persist. No airspace consolidation. The appearance does suggest a degree of congestive heart failure, less than on recent prior study.   Electronically Signed   By: Bretta Bang III M.D.   On: 08/25/2014 09:19   Dg Abd Portable 1v  08/25/2014   CLINICAL DATA:  Abdominal distention  EXAM: PORTABLE ABDOMEN - 1 VIEW  COMPARISON:  CT abdomen and pelvis July 03, 2014  FINDINGS: There is a drainage catheter in the right lateral abdomen. There is no appreciable bowel dilatation or air-fluid level suggesting obstruction. No free air is seen on this supine examination. There is a stent in the right iliac artery region. There are coils in this area as well. There is extensive arthropathy in the lumbar spine.  IMPRESSION: Bowel gas pattern unremarkable. Drain lateral right abdomen. Vascular stent and coils right pelvis.   Electronically Signed   By: Bretta Bang III M.D.   On: 08/25/2014 09:58    Assessment/Plan: 1.  Pseudomonas UTI: He is on appropriate antibiotics, is afebrile and has a normal white blood cell count.  2.  Chronic right hydronephrosis secondary to ureteral obstruction: This is been managed with a nephrostomy tube for sometime.  Although there was some concern about the tube having been dislodged he has a very capacious system and when I reviewed his KUB it appeared that his nephrostomy tube was likely still in  good position.  He has no flank pain and continues to have output from his nephrostomy tube. When his tube is changed he will end up having a nephrostogram to determine whether the nephrostomy tube is in fact in correct position. His INR is still out but I think nephrostomy tube change can be postponed another day as clinically he seems to be doing fine.     LOS: 8 days   Cassiopeia Florentino C 08/26/2014, 1:06 PM

## 2014-08-26 NOTE — Progress Notes (Signed)
SLP Cancellation Note  Patient Details Name: [name] MRN: [id number] DOB: 14-Feb-1926   Cancelled treatment:       Reason Eval/Treat Not Completed: Other (comment) (pt for IR procedure today (nephrostomy tube replacement), tentatively on schedule for MBS at 1300 dependent on IR procedure completion, spoke to RN who reports pt tolerating modified diet, if needed MBS may be postponed until next date)   Mills Koller, MS [facility] SLP [phone]

## 2014-08-26 NOTE — Progress Notes (Signed)
Patient ID: CRIS LONERO, male   DOB: 09/03/1925, 81 y.o.   MRN: [id number] Right percutaneous nephrostomy tube exchange/nephrostogram has tent been scheduled for 08/28/14. Per urology note and nursing nephrostomy tube appears to be functioning properly at this time.

## 2014-08-26 NOTE — Progress Notes (Signed)
[facility] PROGRESS NOTE  [name] FIE:[id number] DOB: February 20, 1926 DOA: Sep 07, 2014 PCP: Lubertha South, MD  Brief narrative:  81 y/o ? Chr A.fib CHAD2 score 3 +prio AC now off 2/2 to Melena in the past, Stg III CKD, Post-obs Uropathy, Chr Systolic HF 35-40%, Prior L Hydronephrosis, prior nephrolithiasis 1980's-->s/p L nephrostomy placed 2015-- repair thoracic aneurysm 1995 and 1996 admitted by PCCM on 08/19/14 with Acute respiratory failure 2/2 to R pleural effusion + SIRS/Sepsis +m coagulopathy He was seen by Cardiology as well and found to have Chr Systolic HF with new-onset Afib this admission-? 2/2 to Tachyarrythmia related CM He has been diuresed per Cardiology but has a poor long-term prognosis given inability to use rate controlling agents as well as massive anasarca limiting diuretic use to control edema His Perc Neprhostomy seeemd to be more bloody overnight 4/30 and patient experienced hypotension requiring an IV saline bolus, Amio gtt was turned off. He has been persistingly hypotensive  Assessment/Plan:  #1 Sepsis Secondary to Pseudomonas UTI   Patient on empiric IV cefepime. Follow. On IV cefepime--Stop date placed Overnight concern 4/30  For potential aspiration as patient has been coughing for the past 2 months and is scheduled to have esophageal dilatation by [place] [facility] at some point--He is not a great candidate for this right now hemodynamically cxr 5/1 =no aspiration As the nephrostomy tube has come out, and he developed hypothermia while on IV abx,   I spoke to Dr. Berneice Heinrich of Urology who rec recommneds urgent Nephrostomy exchange-- D/w IR who rec's KUB and will see patient in consult for ? exchange Appreciate Urology/IR input  #2 Acute respiratory distress 2/2 acute heart failure  secondary to a.fib with RVR and acute on chronic systolic CHF.  significantly volume overloaded/anasarca Slow improvement and no O2 requirement 08/26/2014  #2 A. fib with  RVR Likely secondary to sepsis. Patient on empiric IV antibiotics for Pseudomonas UTI.  Cycle cardiac enzymes every 6 hours 3. Patient had a 2-D echo done early on in this hospitalization which showed a EF= 30 to 35% with no wall motion abnormalities.    #3 likely tachyarrythmia mediated acute on chronic [wet-warm] systolic heart failure blood pressure is borderline and difficult to diureses. Patient also in atrial fibrillation with RVR. ? 6 mo mortality ~ 22% based on Hy-C trial Attempting  To control heart rate. Hold off on diuretics at this time as blood pressure is in the high 80s.  Cardiology-please let me know if there is any role for Nesiritide/Ionotropes? I/o cumulative= +5.9  #4  Coagulopathy 2/2 to CVC liver from #3 INR has been persistently elevated-Vit K x 1 given 5 mg on 5/1--now 2.55 Amio GTT to be continued per Cardiology input 5/1  #4 Cardiogenic shock + iatrogenic hypotension ? From IV amiodarone limiting diuresis and rate control  #7 Esophageal stricture Eventually might benefit from Esophageal dilatation if stabilizes INR and Resp status precludes this at present  #8 acute on chronic renal failure Likely secondary to cardiorenal syndrome. Patient with a poor ejection fraction and currently in acute heart failure with A. fib with RVR.  Get Goals of care meeting by 5/1 if no improvmeent  #9 anemia Likely anemia of chronic disease. No overt bleeding. Follow H&H.  #10 Coagulopathy Patient's INR was greater than 10 on admission. INR still in 4 range. Hold Coumadin.  #11 thrombocytopenia Likely secondary to acute infection--seems to be improving Could consider Liver US No overt bleeding. Follow H&H.  #12 right lower extremity  wound Wound care consult appreciated Rec's=  4 cm x 2 cm wound bed is 100% eschar, separated from wound margins, boggy and fluctuant  Cleanse right lateral lower leg with NS and pat gently dry. Apply Santyl ointment, 1/8 inch thickness  (opaque). Top with NS moist dressing. Cover with 4x4 gauze and secure with kerlix and tape. Change daily.    #13 constipation Senokot-S.  #14 prophylaxis INR supratherapeutic at 4.05. Once INR becomes therapeutic Coumadin will be resumed per pharmacy for DVT prophylaxis.  Code Status: [name] has told me he wishes to be DNR--his prognosis at this stage is gaurded Family Communication: updated granddaughter 5/1 pm Disposition Plan: keep SDU for now-patient was not interested in discussion about goals of care yesterday 5/1    Consultants:  PCCM admit  Cardiology  Procedures:  2-D echo 08/20/2014  Chest x-ray 08/19/2014, 08/19/2014, 08/20/2014, 08/21/2014  X-ray of the hip 08/19/2014  X-ray of the tib-fib 08/19/2014 neg for acute fx, mild knee/ankle joint osteo, extensive peripheral vascular calcification, focal soft tissue swelling  Antibiotics:  IV cefepime 08/21/2014   IV Zosyn 07/30/2014>>>> 08/21/2014  IV vancomycin 08/14/2014>>>> 08/21/2014  Oral doxycycline 08/22/2014>>>08/23/14    HPI/Subjective:  eating breakfast Feels well No CP SOB about the same Asymtpomatic from low blood pressures No n/v No fever or low temp overnight    Objective: Filed Vitals:   08/26/14 0800  BP: 88/51  Pulse: 126  Temp:   Resp: 16    Intake/Output Summary (Last 24 hours) at 08/26/14 0846 Last data filed at 08/26/14 0800  Gross per 24 hour  Intake 1225.19 ml  Output    570 ml  Net 655.19 ml   Filed Weights   08/24/14 0500 08/25/14 0455 08/26/14 0500  Weight: 95.8 kg (211 lb 3.2 oz) 94.2 kg (207 lb 10.8 oz) 96 kg (211 lb 10.3 oz)    Exam:   General:  Using accessory muscles of respiration.  Cardiovascular: Irregularly irregular. +JVD       Respiratory: Bibasilar crackles.  ? AE R post lung fields-breathing appears imporved  Abdomen: Soft/NT/nd/+bs. NEPHROSTOMY TUBE bloody  Musculoskeletal: No c/c/. 3-4+ BLE edema to the hips. RLE with  erythema.  Data Reviewed: Basic Metabolic Panel:  Recent Labs Lab 08/22/14 0543 08/23/14 0522 08/24/14 0350 08/25/14 0933 08/26/14 0350  NA 133* 132* 136 134* 131*  K 4.4 4.3 4.4 4.5 4.6  CL 106 107 107 109 107  CO2 17* 18* 17* 18* 16*  GLUCOSE 91 111* 116* 87 103*  BUN 121* 111* 123* 118* 121*  CREATININE 3.78* 3.84* 3.63* 3.55* 3.52*  CALCIUM 8.0* 7.7* 8.3* 7.9* 7.9*   Liver Function Tests:  Recent Labs Lab 08/20/14 0406 08/25/14 0933 08/26/14 0350  AST 16 13* 17  ALT 14 12* 13*  ALKPHOS 71 68 80  BILITOT 1.4* 0.7 0.7  PROT 6.0 6.1* 5.8*  ALBUMIN 2.7* 2.3* 2.3*   No results for input(s): LIPASE, AMYLASE in the last 168 hours. No results for input(s): AMMONIA in the last 168 hours. CBC:  Recent Labs Lab 08/22/14 0543 08/23/14 0522 08/24/14 0350 08/25/14 0933 08/26/14 0350  WBC 7.2 7.3 8.2 8.4 8.8  NEUTROABS  --  6.1 7.0 7.5 7.6  HGB 9.8* 9.4* 9.8* 9.5* 9.1*  HCT 30.6* 28.8* 29.9* 29.5* 28.3*  MCV 95.0 94.1 94.0 94.2 94.0  PLT 88* 101* 107* 120* 124*   Cardiac Enzymes:  Recent Labs Lab 08/22/14 1230 08/22/14 1724 08/22/14 2320  TROPONINI <0.03 0.03 0.03   BNP (last  3 results)  Recent Labs  08/21/14 0350 08/22/14 1230 08/23/14 0522  BNP 430.2* 647.7* 1201.9*    ProBNP (last 3 results)  Recent Labs  04/09/14 1038  PROBNP 9656.0*    CBG:  Recent Labs Lab 08/20/14 1225  GLUCAP 143*    Recent Results (from the past 240 hour(s))  Blood Culture (routine x 2)     Status: None   Collection Time: 08/24/2014  8:58 PM  Result Value Ref Range Status   Specimen Description BLOOD LEFT HAND  Final   Special Requests BOTTLES DRAWN AEROBIC ONLY 7CC  Final   Culture NO GROWTH 6 DAYS  Final   Report Status 08/24/2014 FINAL  Final  Blood Culture (routine x 2)     Status: None   Collection Time: 2014/08/24  9:05 PM  Result Value Ref Range Status   Specimen Description BLOOD LEFT HAND  Final   Special Requests BOTTLES DRAWN AEROBIC ONLY 7CC   Final   Culture NO GROWTH 6 DAYS  Final   Report Status 08/24/2014 FINAL  Final  Urine culture     Status: None   Collection Time: 2014/08/24 11:33 PM  Result Value Ref Range Status   Specimen Description URINE, CATHETERIZED  Final   Special Requests NONE  Final   Colony Count   Final    >=100,000 COLONIES/ML Performed at [facility]    Culture   Final    PSEUDOMONAS AERUGINOSA Performed at [facility]    Report Status 08/21/2014 FINAL  Final   Organism ID, Bacteria PSEUDOMONAS AERUGINOSA  Final      Susceptibility   Pseudomonas aeruginosa - MIC*    CEFEPIME 4 SENSITIVE Sensitive     CEFTAZIDIME 16 INTERMEDIATE Intermediate     CIPROFLOXACIN <=0.25 SENSITIVE Sensitive     GENTAMICIN <=1 SENSITIVE Sensitive     IMIPENEM 1 SENSITIVE Sensitive     TOBRAMYCIN <=1 SENSITIVE Sensitive     * PSEUDOMONAS AERUGINOSA  Urine culture     Status: None   Collection Time: 08/19/14  2:48 AM  Result Value Ref Range Status   Specimen Description URINE, SUPRAPUBIC  Final   Special Requests NONE  Final   Colony Count   Final    >=100,000 COLONIES/ML Performed at [facility]    Culture   Final    PSEUDOMONAS AERUGINOSA Performed at [facility]    Report Status 08/21/2014 FINAL  Final   Organism ID, Bacteria PSEUDOMONAS AERUGINOSA  Final      Susceptibility   Pseudomonas aeruginosa - MIC*    CEFEPIME 8 SENSITIVE Sensitive     CEFTAZIDIME 16 INTERMEDIATE Intermediate     CIPROFLOXACIN <=0.25 SENSITIVE Sensitive     GENTAMICIN <=1 SENSITIVE Sensitive     IMIPENEM 1 SENSITIVE Sensitive     TOBRAMYCIN <=1 SENSITIVE Sensitive     * PSEUDOMONAS AERUGINOSA  MRSA PCR Screening     Status: None   Collection Time: 08/19/14  3:11 AM  Result Value Ref Range Status   MRSA by PCR NEGATIVE NEGATIVE Final    Comment:        The GeneXpert MRSA Assay (FDA approved for NASAL specimens only), is one component of a comprehensive MRSA colonization surveillance  program. It is not intended to diagnose MRSA infection nor to guide or monitor treatment for MRSA infections.      Studies: Dg Chest Port 1 View  08/25/2014   CLINICAL DATA:  Cough with concern for aspiration  EXAM:  PORTABLE CHEST - 1 VIEW  COMPARISON:  August 23, 2014  FINDINGS: There is stable cardiomegaly. Patient is status post median sternotomy. There is a right effusion which appears stable. There is trace interstitial edema, slightly less than on recent prior study. No new opacity. No adenopathy. Pulmonary vascularity within normal limits.  IMPRESSION: Trace interstitial edema, less than on recent prior study. Cardiomegaly with right effusion persist. No airspace consolidation. The appearance does suggest a degree of congestive heart failure, less than on recent prior study.   Electronically Signed   By: Bretta Bang III M.D.   On: 08/25/2014 09:19   Dg Abd Portable 1v  08/25/2014   CLINICAL DATA:  Abdominal distention  EXAM: PORTABLE ABDOMEN - 1 VIEW  COMPARISON:  CT abdomen and pelvis July 03, 2014  FINDINGS: There is a drainage catheter in the right lateral abdomen. There is no appreciable bowel dilatation or air-fluid level suggesting obstruction. No free air is seen on this supine examination. There is a stent in the right iliac artery region. There are coils in this area as well. There is extensive arthropathy in the lumbar spine.  IMPRESSION: Bowel gas pattern unremarkable. Drain lateral right abdomen. Vascular stent and coils right pelvis.   Electronically Signed   By: Bretta Bang III M.D.   On: 08/25/2014 09:58    Scheduled Meds: . ceFEPime (MAXIPIME) IV  1 g Intravenous Q24H  . collagenase   Topical Daily  . latanoprost  1 drop Both Eyes QHS  . sodium chloride  3 mL Intravenous Q12H  . vitamin B-12  1,000 mcg Oral Daily  . Warfarin - Pharmacist Dosing Inpatient   Does not apply q1800   Continuous Infusions: . sodium chloride 10 mL/hr at 08/25/14 0932  . amiodarone  30 mg/hr (08/25/14 2100)  . diltiazem (CARDIZEM) infusion Stopped (08/22/14 1148)    Principal Problem:   Acute respiratory failure Active Problems:   Chronic kidney disease, stage III (moderate)   Sepsis   Acute on chronic renal failure   Essential hypertension   Malnutrition of moderate degree   A-fib   Acute on chronic systolic heart failure   Cellulitis of leg, right   UTI (lower urinary tract infection)   Arterial hypotension   Cardiogenic shock   ADHF (acute decompensated heart failure)   Nephrostomy complication   Pain   Pleural effusion    Time spent: 26 MINS    Pleas Koch, MD [facility] (P) [phone]

## 2014-08-26 NOTE — Progress Notes (Signed)
Subjective: No complaints,   Objective: Vital signs in last 24 hours: Temp:  [97.7 F (36.5 C)-98 F (36.7 C)] 98 F (36.7 C) (05/02 0727) Pulse Rate:  [26-152] 126 (05/02 0800) Resp:  [14-25] 16 (05/02 0800) BP: (67-101)/(24-66) 88/51 mmHg (05/02 0800) SpO2:  [94 %-100 %] 99 % (05/02 0800) Weight:  [211 lb 10.3 oz (96 kg)] 211 lb 10.3 oz (96 kg) (05/02 0500) Weight change: 3 lb 15.5 oz (1.8 kg) Last BM Date: 08/25/14 Intake/Output from previous day: +581 05/01 0701 - 05/02 0700 In: 1208.5 [P.O.:540; I.V.:618.5; IV Piggyback:50] Out: 610 [Urine:610] Intake/Output this shift: Total I/O In: 26.7 [I.V.:26.7] Out: -   PE: General:Pleasant affect, NAD Skin:Warm and dry, brisk capillary refill HEENT:normocephalic, sclera clear, mucus membranes moist Neck:supple, + JVD  Heart:irreg irreg with soft systolic murmur, no gallup, rub or click Lungs:diminished in bases without rales, rhonchi, or wheezes ZOX:WRUE, non tender, + BS, do not palpate liver spleen or masses Ext: 2- 3+ lower ext edema with erythema on posterior legs, + tender to touch, 2+ radial pulses Neuro:alert and oriented X 3, MAE, follows commands, + facial symmetry Tele:  A fib with rates 106-115   Lab Results:  Recent Labs  08/25/14 0933 08/26/14 0350  WBC 8.4 8.8  HGB 9.5* 9.1*  HCT 29.5* 28.3*  PLT 120* 124*   BMET  Recent Labs  08/25/14 0933 08/26/14 0350  NA 134* 131*  K 4.5 4.6  CL 109 107  CO2 18* 16*  GLUCOSE 87 103*  BUN 118* 121*  CREATININE 3.55* 3.52*  CALCIUM 7.9* 7.9*   No results for input(s): TROPONINI in the last 72 hours.  Invalid input(s): CK, MB  Lab Results  Component Value Date   CHOL 118 11/19/2013   HDL 39* 11/19/2013   LDLCALC 61 11/19/2013   TRIG 89 11/19/2013   CHOLHDL 3.0 11/19/2013   No results found for: HGBA1C   Lab Results  Component Value Date   TSH 4.674* 09/26/2012    Hepatic Function Panel  Recent Labs  08/26/14 0350  PROT 5.8*    ALBUMIN 2.3*  AST 17  ALT 13*  ALKPHOS 80  BILITOT 0.7   No results for input(s): CHOL in the last 72 hours. No results for input(s): PROTIME in the last 72 hours.     Studies/Results: Dg Chest Port 1 View  08/25/2014   CLINICAL DATA:  Cough with concern for aspiration  EXAM: PORTABLE CHEST - 1 VIEW  COMPARISON:  August 23, 2014  FINDINGS: There is stable cardiomegaly. Patient is status post median sternotomy. There is a right effusion which appears stable. There is trace interstitial edema, slightly less than on recent prior study. No new opacity. No adenopathy. Pulmonary vascularity within normal limits.  IMPRESSION: Trace interstitial edema, less than on recent prior study. Cardiomegaly with right effusion persist. No airspace consolidation. The appearance does suggest a degree of congestive heart failure, less than on recent prior study.   Electronically Signed   By: Bretta Bang III M.D.   On: 08/25/2014 09:19   Dg Abd Portable 1v  08/25/2014   CLINICAL DATA:  Abdominal distention  EXAM: PORTABLE ABDOMEN - 1 VIEW  COMPARISON:  CT abdomen and pelvis July 03, 2014  FINDINGS: There is a drainage catheter in the right lateral abdomen. There is no appreciable bowel dilatation or air-fluid level suggesting obstruction. No free air is seen on this supine examination. There is a stent in  the right iliac artery region. There are coils in this area as well. There is extensive arthropathy in the lumbar spine.  IMPRESSION: Bowel gas pattern unremarkable. Drain lateral right abdomen. Vascular stent and coils right pelvis.   Electronically Signed   By: Bretta Bang III M.D.   On: 08/25/2014 09:58   ECHO: Study Conclusions  - Left ventricle: The cavity size was moderately dilated. Systolic function was moderately to severely reduced. The estimated ejection fraction was in the range of 30% to 35%. Wall motion was normal; there were no regional wall motion abnormalities. - Ventricular  septum: Septal motion showed paradox. - Aortic valve: There was moderate regurgitation. - Aortic root: The aortic root was dilated measuring 44 mm. - Ascending aorta: The ascending aorta was dilated measuring 47 mm. - Mitral valve: There was moderate regurgitation. - Left atrium: The atrium was severely dilated. - Right ventricle: The cavity size was severely dilated. Wall thickness was normal. Systolic function was moderately reduced. - Right atrium: The atrium was severely dilated. - Tricuspid valve: There was severe regurgitation. - Pulmonary arteries: Systolic pressure was moderately increased. PA peak pressure: 50 mm Hg (S). - Inferior vena cava: The vessel was dilated. The respirophasic diameter changes were blunted (< 50%), consistent with elevated central venous pressure.   Medications: I have reviewed the patient's current medications. Scheduled Meds: . ceFEPime (MAXIPIME) IV  1 g Intravenous Q24H  . collagenase   Topical Daily  . latanoprost  1 drop Both Eyes QHS  . sodium chloride  3 mL Intravenous Q12H  . vitamin B-12  1,000 mcg Oral Daily  . Warfarin - Pharmacist Dosing Inpatient   Does not apply q1800   Continuous Infusions: . sodium chloride 10 mL/hr at 08/25/14 0932  . amiodarone 30 mg/hr (08/25/14 2100)  . diltiazem (CARDIZEM) infusion Stopped (08/22/14 1148)   PRN Meds:.HYDROcodone-acetaminophen, RESOURCE THICKENUP CLEAR, senna-docusate  Assessment/Plan: 81 year old with chronic systolic heart failure, ejection fraction 35%, persistent atrial fibrillation previously on chronic anticoagulation with Coumadin, persistent hypotension resulting in challenging control of atrial fibrillation, chronic kidney disease-no longer on digoxin.  1. Chronic systolic heart failure: Markedly volume overloaded with sepsis and hypotension with CKD now stage 5, on antimicrobial therapy. He was given one dose of IV Lasix 40 mg on 4/30 with minimal output- though Cr was 4.44.  Hypotensive earlier resulting in administration of IV fluid bolus. He has marked lower extremity edema which has been a challenge to treat. I agree that ACE wraps/compression would be helpful if okay from a wound standpoint.  +581 last 24 hours, +5261 since admit with wt up from 190 to 211.   2. Atrial fibrillation, Chronic : since at least 2012  Amiodarone drip continues with HR in 110s.  Once his BP improves and HR controlled, consider starting oral amiodarone 200 mg twice a day over the next 2 weeks and then reduce to 200 mg daily after that. Amiodarone is being utilized for rate control purposes. Diltiazem was ineffective because of hypotension. No digoxin because of chronic kidney disease. No Toprol because of hypotension.  3. Chronic anticoagulation: Continue with Coumadin. INR was supratherapeutic, likely related to antibiotic use. Today 2.55  (CHA2DS2VASc score 4)  4. Massive lower extremity edema, PA pressures of 50 mmHg systolic: This will continue to be challenging to treat, in light of hypotension related to sepsis. Likely severe chronic venous insufficiency is playing a role as well. Could try Ace wraps as noted above. Lasix has been challenging because of hypotension  with minimal effect on 4/30.  5.  RV dilatation more than left with severe TR and elevated PA pressure of 50  6. Hypotension, 69/48 to 90/59 ? Need for inotrope vs.  HF consult and transfer to [facility].  Pt does not have appearance of 69 systolic BP.  ? PAD playing a role in lower BPs.  Last week on Neo without much change in BP.  7. CKD stage 5 Cr today 3.52 slowly improving from pk of 4.44 on the 08/19/14  8. AAA 5.34 X 5.34 with dilated Also aortic root and ascending aorta dilated   9. Nephrostomy tube- to be exchanged today.  10. Prognosis: per Dr. Anne Fu; "Given his cardiomyopathy, massive edema, considerable hypotension, overall cardiovascular prognosis is poor over the next year. Trying to optimize medical therapy to  the best of our ability."  Pt did not wish to discuss palative care with [facility] yesterday.     LOS: 8 days   Time spent with pt. :20 minutes. [facility]  Nurse Practitioner Certified Pager [phone] or after 5pm and on weekends call [phone] 08/26/2014, 8:56 AM   History and all data above reviewed.  Patient examined.  I agree with the findings as above.  The patient exam reveals NTI:[id number]  ,  Lungs: Clear  ,  Abd: Positive bowel sounds, no rebound no guarding, Ext Severe edema  .  All available labs, radiology testing, previous records reviewed. Agree with documented assessment and plan. I agree with the above.  The only thing we really have to offer are mechanical and nutrition.  Keep feet elevated and I would strongly suggest ACE wraps. Consider dietary consult to help manage his malnutrition.     Fayrene Fearing Zamauri Nez  11:16 AM  08/26/2014

## 2014-08-26 NOTE — Progress Notes (Addendum)
MBS completed, full report to follow in imaging section.  Pt presents with moderate oral and severe pharyno-cervical esophageal dysphagia.    Pt is aspirating secretions evidenced by wet vocal quality and expectoration of viscous secretions into emesis basin during MBS.  Pt admits to issues with expectorating secretions/having a hard time swallowing for at least six months.       Sensorimotor deficits result in gross pharyngeal residuals across all consistencies.    Note pt h/o degenerative disc disease at primarily C5-C6, C6-C7 per imaging study in 2015 impacting clearance of barium in esophagus.  Suspect chronic dysphagia for which pt managed when well but with decreased functional reserve compensation is impacted.    Gross residuals noted across consistencies with pt requiring up to 5 swallows to clear tsp of pudding.  Aspiration (mostly silent unless grossly) both during and after swallow with liquids despite strategies including chin tuck and head turn.  Increased viscosity resulted in increased residuals.     Fortunately pt able to expectorate throughout MBS and clear approximately 90% of barium residuals from pharynx- which largely is protecting his airway.  Suspect his level of dysphagia is contributing to his compromised nutrition.  Using live video and verbal cues/instruction/teach back during MBS, SLP educated pt to findings.  Advised pt that he is at high risk of aspiration and aspirated across all liquid consistencies during testing despite compensation strategies.  Although pt did not aspirate puree, residuals are much worse. If pt is to continue po, it must be with accepted risks of aspiration/malnutrition.  SLP to follow up for pt/family education and help in care plan.     Options: NPO with oral care *ice chips after oral care Or  Continue diet with aspiration risks accepted and strategies to mitigate risk.  Pt expressed desire to this SLP to continue to eat saying "well it's ok".     Edward Burnet, MS [facility] SLP [phone]

## 2014-08-26 NOTE — Progress Notes (Signed)
ANTICOAGULATION CONSULT NOTE - Follow up  Pharmacy Consult for warfarin Indication: atrial fibrillation  Allergies  Allergen Reactions  . Sulfa Antibiotics Rash    Nausea    Patient Measurements: Height:  (was not able get pt. weight bed not operating) Weight: 211 lb 10.3 oz (96 kg) IBW/kg (Calculated) : 73  Vital Signs: Temp: 98 F (36.7 C) (05/02 0727) Temp Source: Oral (05/02 0727) BP: 88/51 mmHg (05/02 0800) Pulse Rate: 126 (05/02 0800)  Labs:  Recent Labs  08/24/14 0350 08/25/14 0403 08/25/14 0933 08/26/14 0350  HGB 9.8*  --  9.5* 9.1*  HCT 29.9*  --  29.5* 28.3*  PLT 107*  --  120* 124*  LABPROT 41.7* 42.5*  --  27.6*  INR 4.32* 4.43*  --  2.55*  CREATININE 3.63*  --  3.55* 3.52*   Estimated Creatinine Clearance: 17.5 mL/min (by C-G formula based on Cr of 3.52).  Medications:  Scheduled:  . ceFEPime (MAXIPIME) IV  1 g Intravenous Q24H  . collagenase   Topical Daily  . latanoprost  1 drop Both Eyes QHS  . sodium chloride  3 mL Intravenous Q12H  . vitamin B-12  1,000 mcg Oral Daily  . Warfarin - Pharmacist Dosing Inpatient   Does not apply q1800   Assessment: 81yo M presented to [facility] w/ leg swelling, suspected cellulitis, elev SCr, and supratherapeutic INR. Pharmacy was consulted to dose warfarin. Recent course of Vantin could have contributed to supratherapeutic INR. Denies significant changes in dietary intake. I confirmed his most recent warfarin regimen with him: 2.5mg  daily except nothing on Wednesdays.  Significant events: 4/25: INR improved to 3.12 after vit.K 10mg  and FFP.  4/26: INR dropped to 2. Gave warf 2.5mg . Started amiodarone. 4/27: INR took large jump, now slightly above therapeutic range 4/28: INR increased further, Doxycycline added- d/c 4/29, Amiodarone started, Diltiazem started/stopped 5/1: INR remains elevated, Amiodarone stopped for low BP, Hematuria noted  Today, 08/26/2014:  INR now within therapeutic range (2.55) s/p vitamin K  last PM, poor po intake on Heart Healthy diet, Amiodarone can increase INR  H/H and platelets low, stable. No bleeding reported/documented.  Amiodarone will increase sensitivity to warfarin as it reaches steady-state over the next couple of weeks.   Goal of Therapy:  INR 2-3   Plan:   Resume small dose of warfarin today - 0.5mg  at 18:00  Check PT/INR daily.  Loralee Pacas, PharmD, BCPS Pager: [phone]  08/26/2014, 9:23 AM

## 2014-08-27 DIAGNOSIS — T17998D Other foreign object in respiratory tract, part unspecified causing other injury, subsequent encounter: Secondary | ICD-10-CM

## 2014-08-27 DIAGNOSIS — Z436 Encounter for attention to other artificial openings of urinary tract: Secondary | ICD-10-CM

## 2014-08-27 DIAGNOSIS — T17908A Unspecified foreign body in respiratory tract, part unspecified causing other injury, initial encounter: Secondary | ICD-10-CM | POA: Insufficient documentation

## 2014-08-27 LAB — CBC WITH DIFFERENTIAL/PLATELET
BASOS ABS: 0 10*3/uL (ref 0.0–0.1)
BASOS PCT: 0 % (ref 0–1)
EOS ABS: 0.2 10*3/uL (ref 0.0–0.7)
Eosinophils Relative: 2 % (ref 0–5)
HCT: 28.1 % — ABNORMAL LOW (ref 39.0–52.0)
Hemoglobin: 9.3 g/dL — ABNORMAL LOW (ref 13.0–17.0)
Lymphocytes Relative: 5 % — ABNORMAL LOW (ref 12–46)
Lymphs Abs: 0.4 10*3/uL — ABNORMAL LOW (ref 0.7–4.0)
MCH: 30.7 pg (ref 26.0–34.0)
MCHC: 33.1 g/dL (ref 30.0–36.0)
MCV: 92.7 fL (ref 78.0–100.0)
Monocytes Absolute: 0.5 10*3/uL (ref 0.1–1.0)
Monocytes Relative: 6 % (ref 3–12)
NEUTROS ABS: 7.4 10*3/uL (ref 1.7–7.7)
NEUTROS PCT: 87 % — AB (ref 43–77)
Platelets: 124 10*3/uL — ABNORMAL LOW (ref 150–400)
RBC: 3.03 MIL/uL — ABNORMAL LOW (ref 4.22–5.81)
RDW: 20.2 % — AB (ref 11.5–15.5)
WBC: 8.4 10*3/uL (ref 4.0–10.5)

## 2014-08-27 LAB — COMPREHENSIVE METABOLIC PANEL
ALBUMIN: 2.3 g/dL — AB (ref 3.5–5.0)
ALT: 12 U/L — AB (ref 17–63)
AST: 14 U/L — AB (ref 15–41)
Alkaline Phosphatase: 74 U/L (ref 38–126)
Anion gap: 8 (ref 5–15)
BUN: 120 mg/dL — ABNORMAL HIGH (ref 6–20)
CHLORIDE: 109 mmol/L (ref 101–111)
CO2: 14 mmol/L — ABNORMAL LOW (ref 22–32)
Calcium: 7.8 mg/dL — ABNORMAL LOW (ref 8.9–10.3)
Creatinine, Ser: 3.59 mg/dL — ABNORMAL HIGH (ref 0.61–1.24)
GFR calc Af Amer: 16 mL/min — ABNORMAL LOW (ref >60)
GFR calc non Af Amer: 14 mL/min — ABNORMAL LOW (ref >60)
Glucose, Bld: 87 mg/dL (ref 70–99)
POTASSIUM: 4.5 mmol/L (ref 3.5–5.1)
SODIUM: 131 mmol/L — AB (ref 135–145)
Total Bilirubin: 0.8 mg/dL (ref 0.3–1.2)
Total Protein: 5.8 g/dL — ABNORMAL LOW (ref 6.5–8.1)

## 2014-08-27 LAB — PROTIME-INR
INR: 2 — ABNORMAL HIGH (ref 0.00–1.49)
Prothrombin Time: 22.8 seconds — ABNORMAL HIGH (ref 11.6–15.2)

## 2014-08-27 LAB — PROCALCITONIN: PROCALCITONIN: 0.38 ng/mL

## 2014-08-27 MED ORDER — SODIUM BICARBONATE 650 MG PO TABS
650.0000 mg | ORAL_TABLET | Freq: Two times a day (BID) | ORAL | Status: DC
  Administered 2014-08-27 – 2014-08-30 (×6): 650 mg via ORAL
  Filled 2014-08-27 (×6): qty 1

## 2014-08-27 MED ORDER — CETYLPYRIDINIUM CHLORIDE 0.05 % MT LIQD
7.0000 mL | Freq: Two times a day (BID) | OROMUCOSAL | Status: DC
  Administered 2014-08-27 – 2014-08-31 (×9): 7 mL via OROMUCOSAL

## 2014-08-27 MED ORDER — WARFARIN SODIUM 5 MG PO TABS: 2.5000 mg | ORAL_TABLET | Freq: Once | ORAL | Status: AC

## 2014-08-27 MED ORDER — AMIODARONE HCL 200 MG PO TABS
400.0000 mg | ORAL_TABLET | Freq: Two times a day (BID) | ORAL | Status: DC
  Administered 2014-08-27 – 2014-08-31 (×8): 400 mg via ORAL
  Filled 2014-08-27 (×8): qty 2

## 2014-08-27 NOTE — Progress Notes (Signed)
Patient legs bilaterally wrapped with ace elastic bandage per Cardiologist order. Patient tolerating fairly. Will continue to monitor patient.

## 2014-08-27 NOTE — Progress Notes (Signed)
[facility] PROGRESS NOTE  [name] JYN:[id number] DOB: Sep 04, 1925 DOA: 09/11/2014 PCP: Lubertha South, MD  Brief narrative:  81 y/o ? Chr A.fib CHAD2 score 3 +prio AC now off 2/2 to Melena in the past, Stg III CKD, Post-obs Uropathy, Chr Systolic HF 35-40%, Prior L Hydronephrosis, prior nephrolithiasis 1980's-->s/p L nephrostomy placed 2015-- repair thoracic aneurysm 1995 and 1996 admitted by PCCM on 08/19/14 with Acute respiratory failure 2/2 to R pleural effusion + SIRS/Sepsis +m coagulopathy He was seen by Cardiology as well and found to have Chr Systolic HF with new-onset Afib this admission-? 2/2 to Tachyarrythmia related CM He has been diuresed per Cardiology but has a poor long-term prognosis given inability to use rate controlling agents as well as massive anasarca limiting diuretic use to control edema His Perc Neprhostomy seeemd to be more bloody overnight 4/30 and patient experienced hypotension requiring an IV saline bolus, Amio gtt was turned off. He has been persistingly hypotensive which might be iatrogenic from Amiogarone Gtt Patient and I had  Along discussion on 5/3 and although he understands he is sick with poor quality of life, is not ready to "give up" I explained the difference between palliative care and Hospice clearly to him and the need to ensure we completely respect his wishes--I did tell him he is Hospice eligible We will attempt to see if he meets criterion for CIR   Assessment/Plan:  #1 Sepsis Secondary to Pseudomonas UTI   Empiric IV cefepime d/c 5/3 [completed 9 days total] Follow. Overnight concern 4/30  For potential aspiration as patient has been coughing for the past 2 months and is scheduled to have esophageal dilatation by [place] [facility] at some point--He is not a great candidate for this right now hemodynamically cxr 5/1 =no aspiration Also- the nephrostomy tube migrated c hypothermia while on IV abx,  D/w IR and is scheduled for  exchange once INR trends below 2  #2 Acute respiratory distress 2/2 acute heart failure  secondary to a.fib with RVR and acute on chronic systolic CHF.  significantly volume overloaded/anasarca Stockings placed Cannot diurese 2/2 to Hypotension Slow improvement and no O2 requirement 08/27/2014  #2 A. fib with RVR Likely secondary to sepsis. Patient on empiric IV antibiotics for Pseudomonas UTI.  Cycle cardiac enzymes every 6 hours 3. Patient had a 2-D echo done early on in this hospitalization which showed a EF= 30 to 35% with no wall motion abnormalities.    #3 likely tachyarrythmia mediated acute on chronic [wet-warm] systolic heart failure blood pressure is borderline and difficult to diureses. Patient also in atrial fibrillation with RVR. ? 6 mo mortality ~ 22% based on Hy-C trial See cardiology notes I/o cumulative= +5.09 and increasing  #4 Coagulopathy 2/2 to CVC liver from #3 INR has been persistently elevated-Vit K x 1 given 5 mg on 5/1--now 2.55--->2.00 Amio GTT to be continued per Cardiology input 5/1  #4 Cardiogenic shock + iatrogenic hypotension ? From IV amiodarone limiting diuresis and rate control  #7 Esophageal stricture Eventually might benefit from Esophageal dilatation if stabilizes INR and Resp status precludes this at present  #8 acute on chronic renal failure with metabolic acidosis resultant from this Likely secondary to cardiorenal syndrome. Patient with a poor ejection fraction and currently in acute heart failure with A. fib with RVR.  Stable but abysmal GFR Add Bicarb tabs 5/3  #9 anemia Likely anemia of chronic disease. No overt bleeding. Follow H&H.  #10 Coagulopathy Patient's INR was greater than 10 on  admission. Hold Coumadin.  #11 thrombocytopenia Likely secondary to acute infection--seems to be improving Could consider Liver US No overt bleeding. Follow H&H.  #12 right lower extremity wound Wound care consult appreciated Rec's=  4 cm  x 2 cm wound bed is 100% eschar, separated from wound margins, boggy and fluctuant  Cleanse right lateral lower leg with NS and pat gently dry. Apply Santyl ointment, 1/8 inch thickness (opaque). Top with NS moist dressing. Cover with 4x4 gauze and secure with kerlix and tape. Change daily.    #13 constipation Senokot-S.  #14 prophylaxis INR supratherapeutic at 4.05. Once INR becomes therapeutic Coumadin will be resumed per pharmacy for DVT prophylaxis.  Code Status: [name] has told me he wishes to be DNR--his prognosis at this stage is gaurded Family Communication: updated granddaughter HCPOA 5/3 pm--[name]-Cell [phone] Disposition Plan: Unclear-see above    Consultants:  PCCM admit  Cardiology  Procedures:  2-D echo 08/20/2014  Chest x-ray 15-Sep-2014, 08/19/2014, 08/20/2014, 08/21/2014  X-ray of the hip 08/19/2014  X-ray of the tib-fib 08/19/2014 neg for acute fx, mild knee/ankle joint osteo, extensive peripheral vascular calcification, focal soft tissue swelling  Antibiotics:  IV cefepime 08/21/2014   IV Zosyn 09/15/14>>>> 08/21/2014  IV vancomycin Sep 15, 2014>>>> 08/21/2014  Oral doxycycline 08/22/2014>>>08/23/14    HPI/Subjective:  sister at bedside No n/v/ SOB minimally improved and hasn't needed supplementary O2 Pain in Le's from stocking No f/chills nor rigor No cough   Objective: Filed Vitals:   08/27/14 1200  BP:   Pulse:   Temp: 97.5 F (36.4 C)  Resp:     Intake/Output Summary (Last 24 hours) at 08/27/14 1652 Last data filed at 08/27/14 1500  Gross per 24 hour  Intake 1370.1 ml  Output    325 ml  Net 1045.1 ml   Filed Weights   08/25/14 0455 08/26/14 0500 08/27/14 0600  Weight: 94.2 kg (207 lb 10.8 oz) 96 kg (211 lb 10.3 oz) 95.5 kg (210 lb 8.6 oz)    Exam:   General:  Using accessory muscles of respiration.  Cardiovascular: Irregularly irregular. +JVD       Respiratory: Bibasilar crackles.  Ae decreased  bilaterally  Abdomen: Soft/NT/nd/+bs. NEPHROSTOMY TUBE clearing  Musculoskeletal: No c/c/. 3-4+ BLE edema to the hips. RLE with erythema.  Wound examined on 5/1 and was clean with think scab on top  Data Reviewed: Basic Metabolic Panel:  Recent Labs Lab 08/23/14 0522 08/24/14 0350 08/25/14 0933 08/26/14 0350 08/27/14 0325  NA 132* 136 134* 131* 131*  K 4.3 4.4 4.5 4.6 4.5  CL 107 107 109 107 109  CO2 18* 17* 18* 16* 14*  GLUCOSE 111* 116* 87 103* 87  BUN 111* 123* 118* 121* 120*  CREATININE 3.84* 3.63* 3.55* 3.52* 3.59*  CALCIUM 7.7* 8.3* 7.9* 7.9* 7.8*   Liver Function Tests:  Recent Labs Lab 08/25/14 0933 08/26/14 0350 08/27/14 0325  AST 13* 17 14*  ALT 12* 13* 12*  ALKPHOS 68 80 74  BILITOT 0.7 0.7 0.8  PROT 6.1* 5.8* 5.8*  ALBUMIN 2.3* 2.3* 2.3*   No results for input(s): LIPASE, AMYLASE in the last 168 hours. No results for input(s): AMMONIA in the last 168 hours. CBC:  Recent Labs Lab 08/23/14 0522 08/24/14 0350 08/25/14 0933 08/26/14 0350 08/27/14 0325  WBC 7.3 8.2 8.4 8.8 8.4  NEUTROABS 6.1 7.0 7.5 7.6 7.4  HGB 9.4* 9.8* 9.5* 9.1* 9.3*  HCT 28.8* 29.9* 29.5* 28.3* 28.1*  MCV 94.1 94.0 94.2 94.0 92.7  PLT 101*  107* 120* 124* 124*   Cardiac Enzymes:  Recent Labs Lab 08/22/14 1230 08/22/14 1724 08/22/14 2320  TROPONINI <0.03 0.03 0.03   BNP (last 3 results)  Recent Labs  08/21/14 0350 08/22/14 1230 08/23/14 0522  BNP 430.2* 647.7* 1201.9*    ProBNP (last 3 results)  Recent Labs  04/09/14 1038  PROBNP 9656.0*    CBG: No results for input(s): GLUCAP in the last 168 hours.  Recent Results (from the past 240 hour(s))  Blood Culture (routine x 2)     Status: None   Collection Time: Aug 27, 2014  8:58 PM  Result Value Ref Range Status   Specimen Description BLOOD LEFT HAND  Final   Special Requests BOTTLES DRAWN AEROBIC ONLY 7CC  Final   Culture NO GROWTH 6 DAYS  Final   Report Status 08/24/2014 FINAL  Final  Blood Culture  (routine x 2)     Status: None   Collection Time: 08-27-2014  9:05 PM  Result Value Ref Range Status   Specimen Description BLOOD LEFT HAND  Final   Special Requests BOTTLES DRAWN AEROBIC ONLY 7CC  Final   Culture NO GROWTH 6 DAYS  Final   Report Status 08/24/2014 FINAL  Final  Urine culture     Status: None   Collection Time: 08/27/14 11:33 PM  Result Value Ref Range Status   Specimen Description URINE, CATHETERIZED  Final   Special Requests NONE  Final   Colony Count   Final    >=100,000 COLONIES/ML Performed at [facility]    Culture   Final    PSEUDOMONAS AERUGINOSA Performed at [facility]    Report Status 08/21/2014 FINAL  Final   Organism ID, Bacteria PSEUDOMONAS AERUGINOSA  Final      Susceptibility   Pseudomonas aeruginosa - MIC*    CEFEPIME 4 SENSITIVE Sensitive     CEFTAZIDIME 16 INTERMEDIATE Intermediate     CIPROFLOXACIN <=0.25 SENSITIVE Sensitive     GENTAMICIN <=1 SENSITIVE Sensitive     IMIPENEM 1 SENSITIVE Sensitive     TOBRAMYCIN <=1 SENSITIVE Sensitive     * PSEUDOMONAS AERUGINOSA  Urine culture     Status: None   Collection Time: 08/19/14  2:48 AM  Result Value Ref Range Status   Specimen Description URINE, SUPRAPUBIC  Final   Special Requests NONE  Final   Colony Count   Final    >=100,000 COLONIES/ML Performed at [facility]    Culture   Final    PSEUDOMONAS AERUGINOSA Performed at [facility]    Report Status 08/21/2014 FINAL  Final   Organism ID, Bacteria PSEUDOMONAS AERUGINOSA  Final      Susceptibility   Pseudomonas aeruginosa - MIC*    CEFEPIME 8 SENSITIVE Sensitive     CEFTAZIDIME 16 INTERMEDIATE Intermediate     CIPROFLOXACIN <=0.25 SENSITIVE Sensitive     GENTAMICIN <=1 SENSITIVE Sensitive     IMIPENEM 1 SENSITIVE Sensitive     TOBRAMYCIN <=1 SENSITIVE Sensitive     * PSEUDOMONAS AERUGINOSA  MRSA PCR Screening     Status: None   Collection Time: 08/19/14  3:11 AM  Result Value Ref Range Status    MRSA by PCR NEGATIVE NEGATIVE Final    Comment:        The GeneXpert MRSA Assay (FDA approved for NASAL specimens only), is one component of a comprehensive MRSA colonization surveillance program. It is not intended to diagnose MRSA infection nor to guide or monitor treatment for MRSA  infections.      Studies: Dg Swallowing Func-speech Pathology  08/26/2014    Objective Swallowing Evaluation:    Patient Details  Name: [name] MRN: [id number] Date of Birth: 1925-10-14  Today's Date: 08/26/2014 Time: SLP Start Time (ACUTE ONLY): 1400-SLP Stop Time (ACUTE ONLY): 1435 SLP Time Calculation (min) (ACUTE ONLY): 35 min  Past Medical History:  Past Medical History  Diagnosis Date  . Hyperlipidemia   . White coat hypertension   . Cardiomyopathy      EF:30-40% in 06/1998, but 50-55% in 2009;  HX ISCHEMIC CARDIOMYOPATHY  . Chronic atrial fibrillation   . Vitamin B12 deficiency   . Chronic anticoagulation   . Arthritis   . Sensory neuropathy   . Chronic kidney disease, stage III (moderate)     creatinine 1.5 2001, 2.0 2009, 2.28 in 12/2009  . Hydronephrosis, bilateral   . Ureteral calculi     BILATERAL  . History of CHF (congestive heart failure)     1999  &  2005  . History of kidney stones   . Bilateral renal cysts   . Aneurysm of infrarenal abdominal aorta 3.9 x 3.6 no change 8/05 no  change in 2010;  4.7 in 2013    MONITORED BY DICKSON-- LAST VISIT OCT 2013--  4.7CM  . Chronic venous insufficiency   . PAD (peripheral artery disease)   . Edema of both legs   . History of gout     per pt stable  . Glaucoma of both eyes   . Wears hearing aid     bilateral  . CHF (congestive heart failure)   . Shortness of breath dyspnea   . Arrhythmia     Afib CHADS VASC Score of 5.   Past Surgical History:  Past Surgical History  Procedure Laterality Date  . Repair thoracic aorta  01/1995    s/p rupture  . Cataract extraction w/ intraocular lens  implant, bilateral  2012  . Endovascular right hypogastric artery aneurysm repair  with graft   08-12-2003  DR DICKSON  . Laparoscopic cholecystectomy  JAN 2005  . Percutaneous nephrostolithotomy  1970's  . Abdominal aortagram  07-16-2003  DR ROTHBART    W/ COIL EMBOLIZATION OF SIX BRANCHES OF RIGHT INTERNAL RENAL ARTERY  ANEURYSM  . Cardiovascular stress test  04-25-2003    LOW RISK CARDIOLITE STUDY/ MODERATE LV DILATATION AND IMPAIRMENT OF  LVSF/  NO ISCHEMIA  . Transthoracic echocardiogram  02-07-2008  DR ROTHBART    MODERATE DILATED LV/  EF 50-55%/ MILD AR/ MILD TO MODERATE AORTIC ROOT  DILATATION/ MODERATE MR/ MODERATE LA   &  RA   DIILATED/ MILD TO MODERATE  TR  . Cystoscopy w/ ureteral stent placement Bilateral 10/24/2012    Procedure: CYSTOSCOPY WITH RETROGRADE PYELOGRAM/URETERAL STENT  PLACEMENT;  Surgeon: Lindaann Slough, MD;  [facility];  Service: Urology;  Laterality: Bilateral;  . Cystoscopy with ureteroscopy Left 11/27/2012    Procedure: CYSTOSCOPY,LEFT URETEROSCOPY WITH LASER LITHOTRIPSY/REMOVAL  OF MIGRATED STENT, PLACEMENT OF LEFT STENT;  Surgeon: Garnett Farm, MD;   Location: [facility];  Service: Urology;  Laterality: Left;  . Holmium laser application Left 11/27/2012    Procedure: HOLMIUM LASER APPLICATION;  Surgeon: Garnett Farm, MD;   Location: [facility];  Service: Urology;  Laterality: Left;  . Cystoscopy with ureteroscopy and stent placement N/A 12/29/2012    Procedure: CYSTOSCOPY WITH URETEROSCOPY AND STENT PLACEMENT, removal of  right and left stents, retrogrades, right stent  exchange;  Surgeon: Garnett Farm, MD;  Location: [facility];  Service: Urology;  Laterality: N/A;  . Holmium laser application Right 12/29/2012    Procedure: HOLMIUM LASER APPLICATION;  Surgeon: Garnett Farm, MD;   Location: [facility];  Service: Urology;  Laterality: Right;  HLL OF (RT)  URETERAL PELVIC JUNCTION STONE  . Cystoscopy with retrograde pyelogram, ureteroscopy and stent placement  Right 01/29/2013    Procedure: CYSTOSCOPY WITH RIGHT URETEROSCOPY AND STENT PLACEMENT;   Surgeon:  Garnett Farm, MD;  Location: [facility];  Service: Urology;   Laterality: Right;  . Holmium laser application N/A 01/29/2013    Procedure: HOLMIUM LASER APPLICATION;  Surgeon: Garnett Farm, MD;   Location: [facility];  Service: Urology;  Laterality: N/A;  . Esophagogastroduodenoscopy N/A 02/16/2013    Procedure: ESOPHAGOGASTRODUODENOSCOPY (EGD);  Surgeon: Meryl Dare,  MD;  Location: [facility];  Service: Endoscopy;  Laterality: N/A;  . Cystoscopy with retrograde pyelogram, ureteroscopy and stent placement  Right 05/07/2013    Procedure: CYSTOSCOPY WITH RIGHT RETROGRADE PYELOGRAM, Balloon dilation  of right ureter, Laser incision of right ureter, Nephrostogram;  Surgeon:  Garnett Farm, MD;  Location: [facility];  Service: Urology;  Laterality:  Right;  . Holmium laser application Right 05/07/2013    Procedure: HOLMIUM LASER APPLICATION;  Surgeon: Garnett Farm, MD;   Location: [facility];  Service: Urology;  Laterality: Right;  . Cystoscopy with retrograde pyelogram, ureteroscopy and stent placement  Left 07/15/2014    Procedure: LEFT URETEROSCOPY RETROGRADE PYELOGRAM TRANSECTION OF BLADDER  TUMOR WITH BLADDER LESION BIOPSIES;  Surgeon: Ihor Gully, MD;  Location:  [facility];  Service: Urology;  Laterality: Left;   HPI:  Other Pertinent Information: 81 y/o male with extensive PMH including Afib  on coumadin, CKD III, chronic sCHF (EF 35%), PAD presented 4/24 to [facility] ED with c/o RLE wound with concern for ischemia. Also found to have  acute on CKD, elevated lactate and borderline BP and was tx to [facility]  to PCCM with ?sepsis.CXR 4/24-unchanged persistent chornic right pleural  effusion. MD notes concern for aspiration with esophageal dysmotility.   No Data Recorded  Assessment / Plan / Recommendation Pt presents with moderate oral and severe pharyno-cervical esophageal  dysphagia.   Pt is aspirating secretions evidenced by wet vocal quality and  expectoration of viscous secretions into emesis basin during MBS.  Pt  admits to issues with expectorating secretions/having a hard time  swallowing for at least six months.    Sensorimotor deficits result in gross pharyngeal  residuals(vallecular/pyriform sinus) across all consistencies.   Note pt h/o degenerative disc disease at primarily C5-C6, C6-C7 per  imaging study in 2015 impacting clearance of barium in esophagus. Suspect  chronic dysphagia for which pt managed when well but with decreased  functional reserve compensation is impacted.   Gross residuals noted across consistencies with pt requiring up to 5  swallows to clear tsp of pudding. Aspiration (mostly silent unless  grossly) both during and after swallow with liquids despite strategies  including chin tuck and head turn. Increased viscosity resulted in  increased residuals.   Fortunately pt able to expectorate throughout MBS and clear approximately  90% of barium residuals from pharynx- which largely is protecting his  airway. Suspect his level of dysphagia is contributing to his compromised  nutrition.  Using live video and verbal cues/instruction/teach back during MBS, SLP  educated pt to findings. Advised pt that he is at high risk  of aspiration  and aspirated across all consistencies during testing despite compensation  strategies. Although pt did not aspirate puree, residuals are much worse.  If pt is to continue po, it must be with accepted risks of  aspiration/malnutrition. SLP to follow up for pt/family education and  help in care plan. Marland Kitchen   Options: NPO with oral care *ice chips after oral care Or  Continue diet with aspiration risks accepted and strategies to mitigate  risk. Pt expressed desire to this SLP to continue to eat saying "well  it's ok".   Donavan Burnet, MS [facility] SLP [phone]                CHL IP TREATMENT RECOMMENDATION 08/26/2014  Treatment Recommendations Therapy as outlined in treatment plan below     CHL IP DIET RECOMMENDATION 08/26/2014  SLP Diet Recommendations NPO;Ice  chips PRN after oral care  Liquid Administration via (None)  Medication Administration Via alternative means  Compensations (None)  Postural Changes and/or Swallow Maneuvers (None)     CHL IP OTHER RECOMMENDATIONS 08/26/2014  Recommended Consults (None)  Oral Care Recommendations Oral care QID  Other Recommendations (None)     CHL IP FOLLOW UP RECOMMENDATIONS 07/04/2014  Follow up Recommendations None     CHL IP FREQUENCY AND DURATION 08/26/2014  Speech Therapy Frequency (ACUTE ONLY) (None)  Treatment Duration 1 week      CHL IP ORAL PHASE 08/26/2014  Lips (None)  Tongue (None)  Mucous membranes (None)  Nutritional status (None)  Other (None)  Oxygen therapy (None)  Oral Phase Impaired  Oral - Pudding Teaspoon (None)  Oral - Pudding Cup (None)  Oral - Honey Teaspoon (None)  Oral - Honey Cup (None)  Oral - Honey Syringe (None)  Oral - Nectar Teaspoon (None)  Oral - Nectar Cup (None)  Oral - Nectar Straw (None)  Oral - Nectar Syringe (None)  Oral - Ice Chips (None)  Oral - Thin Teaspoon (None)  Oral - Thin Cup (None)  Oral - Thin Straw (None)  Oral - Thin Syringe (None)  Oral - Puree (None)  Oral - Mechanical Soft (None)  Oral - Regular (None)  Oral - Multi-consistency (None)  Oral - Pill (None)  Oral Phase - Comment (None)      CHL IP PHARYNGEAL PHASE 08/26/2014  Pharyngeal Phase Impaired  Pharyngeal - Pudding Teaspoon (None)  Penetration/Aspiration details (pudding teaspoon) (None)  Pharyngeal - Pudding Cup (None)  Penetration/Aspiration details (pudding cup) (None)  Pharyngeal - Honey Teaspoon (None)  Penetration/Aspiration details (honey teaspoon) (None)  Pharyngeal - Honey Cup (None)  Penetration/Aspiration details (honey cup) (None)  Pharyngeal - Honey Syringe (None)  Penetration/Aspiration details (honey syringe) (None)  Pharyngeal - Nectar Teaspoon (None)  Penetration/Aspiration details (nectar teaspoon) (None)  Pharyngeal - Nectar Cup (None)  Penetration/Aspiration details (nectar cup) (None)  Pharyngeal - Nectar  Straw (None)  Penetration/Aspiration details (nectar straw) (None)  Pharyngeal - Nectar Syringe (None)  Penetration/Aspiration details (nectar syringe) (None)  Pharyngeal - Ice Chips (None)  Penetration/Aspiration details (ice chips) (None)  Pharyngeal - Thin Teaspoon (None)  Penetration/Aspiration details (thin teaspoon) (None)  Pharyngeal - Thin Cup (None)  Penetration/Aspiration details (thin cup) (None)  Pharyngeal - Thin Straw (None)  Penetration/Aspiration details (thin straw) (None)  Pharyngeal - Thin Syringe (None)  Penetration/Aspiration details (thin syringe') (None)  Pharyngeal - Puree (None)  Penetration/Aspiration details (puree) (None)  Pharyngeal - Mechanical Soft (None)  Penetration/Aspiration details (mechanical soft) (None)  Pharyngeal - Regular (None)  Penetration/Aspiration details (regular) (  None)  Pharyngeal - Multi-consistency (None)  Penetration/Aspiration details (multi-consistency) (None)  Pharyngeal - Pill (None)  Penetration/Aspiration details (pill) (None)  Pharyngeal Comment head turn right/left with and without chin tuck not  effective to prevent residuals nor aspiriation, fortunately pt was strong  enough to expectorate secretions mixed with barium clearing approximately  90% of barium residuals from pharynx, aspiration varied from mild to  severe amount with sensation only noted at severe amounts     CHL IP CERVICAL ESOPHAGEAL PHASE 08/26/2014  Cervical Esophageal Phase Impaired  Pudding Teaspoon (None)  Pudding Cup (None)  Honey Teaspoon (None)  Honey Cup (None)  Honey Straw (None)  Nectar Teaspoon Reduced cricopharyngeal relaxation  Nectar Cup Reduced cricopharyngeal relaxation  Nectar Straw Reduced cricopharyngeal relaxation  Nectar Sippy Cup (None)  Thin Teaspoon Reduced cricopharyngeal relaxation  Thin Cup Reduced cricopharyngeal relaxation  Thin Straw (None)  Thin Sippy Cup (None)  Cervical Esophageal Comment very poor clearance into esophagus resulting  in gross residuals and  subsequent aspiration across all consistencies  *except puree    No flowsheet data found.        Donavan Burnet, MS [facility] SLP [phone]     Scheduled Meds: . amiodarone  400 mg Oral BID  . antiseptic oral rinse  7 mL Mouth Rinse BID  . collagenase   Topical Daily  . latanoprost  1 drop Both Eyes QHS  . sodium chloride  3 mL Intravenous Q12H  . vitamin B-12  1,000 mcg Oral Daily  . [COMPLETED] warfarin  2.5 mg Oral ONCE-1800  . Warfarin - Pharmacist Dosing Inpatient   Does not apply q1800   Continuous Infusions: . sodium chloride 10 mL/hr at 08/25/14 0932    Principal Problem:   Acute respiratory failure Active Problems:   Chronic kidney disease, stage III (moderate)   Sepsis   Acute on chronic renal failure   Essential hypertension   Malnutrition of moderate degree   A-fib   Acute on chronic systolic heart failure   Cellulitis of leg, right   UTI (lower urinary tract infection)   Arterial hypotension   Cardiogenic shock   ADHF (acute decompensated heart failure)   Nephrostomy complication   Pain   Pleural effusion    Time spent: 45 MINS  > 50% of the time was face to face    Pleas Koch, MD [facility] (P) [phone]

## 2014-08-27 NOTE — Progress Notes (Deleted)
[facility] PROGRESS NOTE  [name] EAV:[id number] DOB: 09-15-1925 DOA: 08/13/2014 PCP: Lubertha South, MD  Brief narrative:  81 y/o ? Chr A.fib CHAD2 score 3 +prio AC now off 2/2 to Melena in the past, Stg III CKD, Post-obs Uropathy, Chr Systolic HF 35-40%, Prior L Hydronephrosis, prior nephrolithiasis 1980's-->s/p L nephrostomy placed 2015-- repair thoracic aneurysm 1995 and 1996 admitted by PCCM on 08/19/14 with Acute respiratory failure 2/2 to R pleural effusion + SIRS/Sepsis +m coagulopathy He was seen by Cardiology as well and found to have Chr Systolic HF with new-onset Afib this admission-? 2/2 to Tachyarrythmia related CM He has been diuresed per Cardiology but has a poor long-term prognosis given inability to use rate controlling agents as well as massive anasarca limiting diuretic use to control edema His Perc Neprhostomy seeemd to be more bloody overnight 4/30 and patient experienced hypotension requiring an IV saline bolus, Amio gtt was turned off. He has been persistingly hypotensive As cultures were found to be neg from 4/30, and Lactic acid and PCT were non suggestive of infection his Cefepime was d/c 5/3  Assessment/Plan:  #1 Sepsis Secondary to Pseudomonas UTI   Patient on empiric IV cefepime. Follow. On IV cefepime--Stop date placed Overnight concern 4/30  For potential aspiration as patient has been coughing for the past 2 months and is scheduled to have esophageal dilatation by [facility] at some point--He is not a great candidate for this right now hemodynamically cxr 5/1 =no aspiration As the nephrostomy tube has come out, and he developed hypothermia while on IV abx,   I spoke to Dr. Berneice Heinrich of Urology who rec recommneds urgent Nephrostomy exchange-- D/w IR who rec's KUB and will see patient in consult for ? exchange Appreciate Urology/IR input  #2 Acute respiratory distress 2/2 acute heart failure  secondary to a.fib with RVR and acute on chronic  systolic CHF.  significantly volume overloaded/anasarca Slow improvement and no O2 requirement 08/27/2014  #2 A. fib with RVR Likely secondary to sepsis. Patient on empiric IV antibiotics for Pseudomonas UTI.  Cycle cardiac enzymes every 6 hours 3. Patient had a 2-D echo done early on in this hospitalization which showed a EF= 30 to 35% with no wall motion abnormalities.    #3 likely tachyarrythmia mediated acute on chronic [wet-warm] systolic heart failure blood pressure is borderline and difficult to diureses. Patient also in atrial fibrillation with RVR. ? 6 mo mortality ~ 22% based on Hy-C trial Attempting  to control heart rate. Hold off on diuretics at this time as blood pressure is in the high 80s.  No role for  Nesiritide/Ionotropes I/o cumulative= +5.9--he continues to accumulate fluid  #4 Coagulopathy 2/2 to CVC liver from #3 INR has been persistently elevated-Vit K x 1 given 5 mg on 5/1--now 2.55 Amio GTT to be continued per Cardiology input 5/1  #4 Cardiogenic shock + iatrogenic hypotension ? From IV amiodarone limiting diuresis and rate control  #7 Esophageal stricture Eventually might benefit from Esophageal dilatation if stabilizes INR and Resp status precludes this at present  #8 acute on chronic renal failure Likely secondary to cardiorenal syndrome. Patient with a poor ejection fraction and currently in acute heart failure with A. fib with RVR.  Get Goals of care meeting by 5/1 if no improvmeent  #9 anemia Likely anemia of chronic disease. No overt bleeding. Follow H&H.  #10 Coagulopathy Patient's INR was greater than 10 on admission. INR still in 4 range. Hold Coumadin.  #11 thrombocytopenia Likely  secondary to acute infection--seems to be improving Could consider Liver US No overt bleeding. Follow H&H.  #12 right lower extremity wound Wound care consult appreciated Rec's=  4 cm x 2 cm wound bed is 100% eschar, separated from wound margins, boggy and  fluctuant  Cleanse right lateral lower leg with NS and pat gently dry. Apply Santyl ointment, 1/8 inch thickness (opaque). Top with NS moist dressing. Cover with 4x4 gauze and secure with kerlix and tape. Change daily.    #13 constipation Senokot-S.  #14 prophylaxis INR supratherapeutic at 4.05. Once INR becomes therapeutic Coumadin will be resumed per pharmacy for DVT prophylaxis.  Code Status: [name] has told me he wishes to be DNR--his prognosis at this stage is gaurded Family Communication: updated granddaughter 5/1 pm Disposition Plan: keep SDU for now-patient was not interested in discussion about goals of care yesterday 5/1    Consultants:  PCCM admit  Cardiology  Procedures:  2-D echo 08/20/2014  Chest x-ray 2014-09-06, 08/19/2014, 08/20/2014, 08/21/2014  X-ray of the hip 08/19/2014  X-ray of the tib-fib 08/19/2014 neg for acute fx, mild knee/ankle joint osteo, extensive peripheral vascular calcification, focal soft tissue swelling  Antibiotics:  IV cefepime 08/21/2014   IV Zosyn Sep 06, 2014>>>> 08/21/2014  IV vancomycin 09/06/14>>>> 08/21/2014  Oral doxycycline 08/22/2014>>>08/23/14    HPI/Subjective:  eating breakfast Feels well No CP SOB about the same Asymtpomatic from low blood pressures No n/v No fever or low temp overnight    Objective: Filed Vitals:   08/27/14 0700  BP: 82/57  Pulse: 119  Temp:   Resp: 17    Intake/Output Summary (Last 24 hours) at 08/27/14 0726 Last data filed at 08/27/14 0600  Gross per 24 hour  Intake 1620.1 ml  Output    276 ml  Net 1344.1 ml   Filed Weights   08/25/14 0455 08/26/14 0500 08/27/14 0600  Weight: 94.2 kg (207 lb 10.8 oz) 96 kg (211 lb 10.3 oz) 95.5 kg (210 lb 8.6 oz)    Exam:   General:  Using accessory muscles of respiration.  Cardiovascular: Irregularly irregular. +JVD       Respiratory: Bibasilar crackles.  ? AE R post lung fields-breathing appears imporved  Abdomen:  Soft/NT/nd/+bs. NEPHROSTOMY TUBE bloody  Musculoskeletal: No c/c/. 3-4+ BLE edema to the hips. RLE with erythema.  Data Reviewed: Basic Metabolic Panel:  Recent Labs Lab 08/22/14 0543 08/23/14 0522 08/24/14 0350 08/25/14 0933 08/26/14 0350  NA 133* 132* 136 134* 131*  K 4.4 4.3 4.4 4.5 4.6  CL 106 107 107 109 107  CO2 17* 18* 17* 18* 16*  GLUCOSE 91 111* 116* 87 103*  BUN 121* 111* 123* 118* 121*  CREATININE 3.78* 3.84* 3.63* 3.55* 3.52*  CALCIUM 8.0* 7.7* 8.3* 7.9* 7.9*   Liver Function Tests:  Recent Labs Lab 08/25/14 0933 08/26/14 0350  AST 13* 17  ALT 12* 13*  ALKPHOS 68 80  BILITOT 0.7 0.7  PROT 6.1* 5.8*  ALBUMIN 2.3* 2.3*   No results for input(s): LIPASE, AMYLASE in the last 168 hours. No results for input(s): AMMONIA in the last 168 hours. CBC:  Recent Labs Lab 08/22/14 0543 08/23/14 0522 08/24/14 0350 08/25/14 0933 08/26/14 0350  WBC 7.2 7.3 8.2 8.4 8.8  NEUTROABS  --  6.1 7.0 7.5 7.6  HGB 9.8* 9.4* 9.8* 9.5* 9.1*  HCT 30.6* 28.8* 29.9* 29.5* 28.3*  MCV 95.0 94.1 94.0 94.2 94.0  PLT 88* 101* 107* 120* 124*   Cardiac Enzymes:  Recent Labs Lab 08/22/14 1230  08/22/14 1724 08/22/14 2320  TROPONINI <0.03 0.03 0.03   BNP (last 3 results)  Recent Labs  08/21/14 0350 08/22/14 1230 08/23/14 0522  BNP 430.2* 647.7* 1201.9*    ProBNP (last 3 results)  Recent Labs  04/09/14 1038  PROBNP 9656.0*    CBG:  Recent Labs Lab 08/20/14 1225  GLUCAP 143*    Recent Results (from the past 240 hour(s))  Blood Culture (routine x 2)     Status: None   Collection Time: 08/13/2014  8:58 PM  Result Value Ref Range Status   Specimen Description BLOOD LEFT HAND  Final   Special Requests BOTTLES DRAWN AEROBIC ONLY 7CC  Final   Culture NO GROWTH 6 DAYS  Final   Report Status 08/24/2014 FINAL  Final  Blood Culture (routine x 2)     Status: None   Collection Time: 08/11/2014  9:05 PM  Result Value Ref Range Status   Specimen Description BLOOD  LEFT HAND  Final   Special Requests BOTTLES DRAWN AEROBIC ONLY 7CC  Final   Culture NO GROWTH 6 DAYS  Final   Report Status 08/24/2014 FINAL  Final  Urine culture     Status: None   Collection Time: 08/02/2014 11:33 PM  Result Value Ref Range Status   Specimen Description URINE, CATHETERIZED  Final   Special Requests NONE  Final   Colony Count   Final    >=100,000 COLONIES/ML Performed at [facility]    Culture   Final    PSEUDOMONAS AERUGINOSA Performed at [facility]    Report Status 08/21/2014 FINAL  Final   Organism ID, Bacteria PSEUDOMONAS AERUGINOSA  Final      Susceptibility   Pseudomonas aeruginosa - MIC*    CEFEPIME 4 SENSITIVE Sensitive     CEFTAZIDIME 16 INTERMEDIATE Intermediate     CIPROFLOXACIN <=0.25 SENSITIVE Sensitive     GENTAMICIN <=1 SENSITIVE Sensitive     IMIPENEM 1 SENSITIVE Sensitive     TOBRAMYCIN <=1 SENSITIVE Sensitive     * PSEUDOMONAS AERUGINOSA  Urine culture     Status: None   Collection Time: 08/19/14  2:48 AM  Result Value Ref Range Status   Specimen Description URINE, SUPRAPUBIC  Final   Special Requests NONE  Final   Colony Count   Final    >=100,000 COLONIES/ML Performed at [facility]    Culture   Final    PSEUDOMONAS AERUGINOSA Performed at [facility]    Report Status 08/21/2014 FINAL  Final   Organism ID, Bacteria PSEUDOMONAS AERUGINOSA  Final      Susceptibility   Pseudomonas aeruginosa - MIC*    CEFEPIME 8 SENSITIVE Sensitive     CEFTAZIDIME 16 INTERMEDIATE Intermediate     CIPROFLOXACIN <=0.25 SENSITIVE Sensitive     GENTAMICIN <=1 SENSITIVE Sensitive     IMIPENEM 1 SENSITIVE Sensitive     TOBRAMYCIN <=1 SENSITIVE Sensitive     * PSEUDOMONAS AERUGINOSA  MRSA PCR Screening     Status: None   Collection Time: 08/19/14  3:11 AM  Result Value Ref Range Status   MRSA by PCR NEGATIVE NEGATIVE Final    Comment:        The GeneXpert MRSA Assay (FDA approved for NASAL specimens only), is one  component of a comprehensive MRSA colonization surveillance program. It is not intended to diagnose MRSA infection nor to guide or monitor treatment for MRSA infections.      Studies: Dg Chest [facility] 1 [address]  08/25/2014   CLINICAL DATA:  Cough with concern for aspiration  EXAM: PORTABLE CHEST - 1 VIEW  COMPARISON:  August 23, 2014  FINDINGS: There is stable cardiomegaly. Patient is status post median sternotomy. There is a right effusion which appears stable. There is trace interstitial edema, slightly less than on recent prior study. No new opacity. No adenopathy. Pulmonary vascularity within normal limits.  IMPRESSION: Trace interstitial edema, less than on recent prior study. Cardiomegaly with right effusion persist. No airspace consolidation. The appearance does suggest a degree of congestive heart failure, less than on recent prior study.   Electronically Signed   By: Bretta Bang III M.D.   On: 08/25/2014 09:19   Dg Abd Portable 1v  08/25/2014   CLINICAL DATA:  Abdominal distention  EXAM: PORTABLE ABDOMEN - 1 VIEW  COMPARISON:  CT abdomen and pelvis July 03, 2014  FINDINGS: There is a drainage catheter in the right lateral abdomen. There is no appreciable bowel dilatation or air-fluid level suggesting obstruction. No free air is seen on this supine examination. There is a stent in the right iliac artery region. There are coils in this area as well. There is extensive arthropathy in the lumbar spine.  IMPRESSION: Bowel gas pattern unremarkable. Drain lateral right abdomen. Vascular stent and coils right pelvis.   Electronically Signed   By: Bretta Bang III M.D.   On: 08/25/2014 09:58   Dg Swallowing Func-speech Pathology  08/26/2014    Objective Swallowing Evaluation:    Patient Details  Name: [name] MRN: [id number] Date of Birth: July 04, 1925  Today's Date: 08/26/2014 Time: SLP Start Time (ACUTE ONLY): 1400-SLP Stop Time (ACUTE ONLY): 1435 SLP Time Calculation (min) (ACUTE ONLY): 35 min   Past Medical History:  Past Medical History  Diagnosis Date  . Hyperlipidemia   . White coat hypertension   . Cardiomyopathy      EF:30-40% in 06/1998, but 50-55% in 2009;  HX ISCHEMIC CARDIOMYOPATHY  . Chronic atrial fibrillation   . Vitamin B12 deficiency   . Chronic anticoagulation   . Arthritis   . Sensory neuropathy   . Chronic kidney disease, stage III (moderate)     creatinine 1.5 2001, 2.0 2009, 2.28 in 12/2009  . Hydronephrosis, bilateral   . Ureteral calculi     BILATERAL  . History of CHF (congestive heart failure)     1999  &  2005  . History of kidney stones   . Bilateral renal cysts   . Aneurysm of infrarenal abdominal aorta 3.9 x 3.6 no change 8/05 no  change in 2010;  4.7 in 2013    MONITORED BY DICKSON-- LAST VISIT OCT 2013--  4.7CM  . Chronic venous insufficiency   . PAD (peripheral artery disease)   . Edema of both legs   . History of gout     per pt stable  . Glaucoma of both eyes   . Wears hearing aid     bilateral  . CHF (congestive heart failure)   . Shortness of breath dyspnea   . Arrhythmia     Afib CHADS VASC Score of 5.   Past Surgical History:  Past Surgical History  Procedure Laterality Date  . Repair thoracic aorta  01/1995    s/p rupture  . Cataract extraction w/ intraocular lens  implant, bilateral  2012  . Endovascular right hypogastric artery aneurysm repair with graft   08-12-2003  DR DICKSON  . Laparoscopic cholecystectomy  JAN 2005  . Percutaneous nephrostolithotomy  1970's  . Abdominal aortagram  07-16-2003  DR ROTHBART    W/ COIL EMBOLIZATION OF SIX BRANCHES OF RIGHT INTERNAL RENAL ARTERY  ANEURYSM  . Cardiovascular stress test  04-25-2003    LOW RISK CARDIOLITE STUDY/ MODERATE LV DILATATION AND IMPAIRMENT OF  LVSF/  NO ISCHEMIA  . Transthoracic echocardiogram  02-07-2008  DR ROTHBART    MODERATE DILATED LV/  EF 50-55%/ MILD AR/ MILD TO MODERATE AORTIC ROOT  DILATATION/ MODERATE MR/ MODERATE LA   &  RA   DIILATED/ MILD TO MODERATE  TR  . Cystoscopy w/ ureteral stent placement  Bilateral 10/24/2012    Procedure: CYSTOSCOPY WITH RETROGRADE PYELOGRAM/URETERAL STENT  PLACEMENT;  Surgeon: Lindaann Slough, MD;  Location: [facility];  Service: Urology;  Laterality: Bilateral;  . Cystoscopy with ureteroscopy Left 11/27/2012    Procedure: CYSTOSCOPY,LEFT URETEROSCOPY WITH LASER LITHOTRIPSY/REMOVAL  OF MIGRATED STENT, PLACEMENT OF LEFT STENT;  Surgeon: Garnett Farm, MD;   Location: [facility];  Service: Urology;  Laterality: Left;  . Holmium laser application Left 11/27/2012    Procedure: HOLMIUM LASER APPLICATION;  Surgeon: Garnett Farm, MD;   Location: [facility];  Service: Urology;  Laterality: Left;  . Cystoscopy with ureteroscopy and stent placement N/A 12/29/2012    Procedure: CYSTOSCOPY WITH URETEROSCOPY AND STENT PLACEMENT, removal of  right and left stents, retrogrades, right stent exchange;  Surgeon: Garnett Farm, MD;  Location: [facility];  Service: Urology;  Laterality: N/A;  . Holmium laser application Right 12/29/2012    Procedure: HOLMIUM LASER APPLICATION;  Surgeon: Garnett Farm, MD;   Location: [facility];  Service: Urology;  Laterality: Right;  HLL OF (RT)  URETERAL PELVIC JUNCTION STONE  . Cystoscopy with retrograde pyelogram, ureteroscopy and stent placement  Right 01/29/2013    Procedure: CYSTOSCOPY WITH RIGHT URETEROSCOPY AND STENT PLACEMENT;   Surgeon: Garnett Farm, MD;  Location: [facility];  Service: Urology;   Laterality: Right;  . Holmium laser application N/A 01/29/2013    Procedure: HOLMIUM LASER APPLICATION;  Surgeon: Garnett Farm, MD;   Location: [facility];  Service: Urology;  Laterality: N/A;  . Esophagogastroduodenoscopy N/A 02/16/2013    Procedure: ESOPHAGOGASTRODUODENOSCOPY (EGD);  Surgeon: Meryl Dare,  MD;  Location: [facility];  Service: Endoscopy;  Laterality: N/A;  . Cystoscopy with retrograde pyelogram, ureteroscopy and stent placement  Right 05/07/2013    Procedure: CYSTOSCOPY WITH RIGHT RETROGRADE PYELOGRAM, Balloon dilation  of right ureter, Laser  incision of right ureter, Nephrostogram;  Surgeon:  Garnett Farm, MD;  Location: [facility];  Service: Urology;  Laterality:  Right;  . Holmium laser application Right 05/07/2013    Procedure: HOLMIUM LASER APPLICATION;  Surgeon: Garnett Farm, MD;   Location: [facility];  Service: Urology;  Laterality: Right;  . Cystoscopy with retrograde pyelogram, ureteroscopy and stent placement  Left 07/15/2014    Procedure: LEFT URETEROSCOPY RETROGRADE PYELOGRAM TRANSECTION OF BLADDER  TUMOR WITH BLADDER LESION BIOPSIES;  Surgeon: Ihor Gully, MD;  Location:  [facility];  Service: Urology;  Laterality: Left;   HPI:  Other Pertinent Information: 81 y/o male with extensive PMH including Afib  on coumadin, CKD III, chronic sCHF (EF 35%), PAD presented 4/24 to [facility] ED with c/o RLE wound with concern for ischemia. Also found to have  acute on CKD, elevated lactate and borderline BP and was tx to [facility]  to PCCM with ?sepsis.CXR 4/24-unchanged persistent chornic right pleural  effusion. MD notes concern for  aspiration with esophageal dysmotility.   No Data Recorded  Assessment / Plan / Recommendation Pt presents with moderate oral and severe pharyno-cervical esophageal  dysphagia.   Pt is aspirating secretions evidenced by wet vocal quality and  expectoration of viscous secretions into emesis basin during MBS. Pt  admits to issues with expectorating secretions/having a hard time  swallowing for at least six months.    Sensorimotor deficits result in gross pharyngeal  residuals(vallecular/pyriform sinus) across all consistencies.   Note pt h/o degenerative disc disease at primarily C5-C6, C6-C7 per  imaging study in 2015 impacting clearance of barium in esophagus. Suspect  chronic dysphagia for which pt managed when well but with decreased  functional reserve compensation is impacted.   Gross residuals noted across consistencies with pt requiring up to 5  swallows to clear tsp of pudding. Aspiration (mostly silent  unless  grossly) both during and after swallow with liquids despite strategies  including chin tuck and head turn. Increased viscosity resulted in  increased residuals.   Fortunately pt able to expectorate throughout MBS and clear approximately  90% of barium residuals from pharynx- which largely is protecting his  airway. Suspect his level of dysphagia is contributing to his compromised  nutrition.  Using live video and verbal cues/instruction/teach back during MBS, SLP  educated pt to findings. Advised pt that he is at high risk of aspiration  and aspirated across all consistencies during testing despite compensation  strategies. Although pt did not aspirate puree, residuals are much worse.  If pt is to continue po, it must be with accepted risks of  aspiration/malnutrition. SLP to follow up for pt/family education and  help in care plan. Marland Kitchen   Options: NPO with oral care *ice chips after oral care Or  Continue diet with aspiration risks accepted and strategies to mitigate  risk. Pt expressed desire to this SLP to continue to eat saying "well  it's ok".   Donavan Burnet, MS [facility] SLP [phone]                CHL IP TREATMENT RECOMMENDATION 08/26/2014  Treatment Recommendations Therapy as outlined in treatment plan below     CHL IP DIET RECOMMENDATION 08/26/2014  SLP Diet Recommendations NPO;Ice chips PRN after oral care  Liquid Administration via (None)  Medication Administration Via alternative means  Compensations (None)  Postural Changes and/or Swallow Maneuvers (None)     CHL IP OTHER RECOMMENDATIONS 08/26/2014  Recommended Consults (None)  Oral Care Recommendations Oral care QID  Other Recommendations (None)     CHL IP FOLLOW UP RECOMMENDATIONS 07/04/2014  Follow up Recommendations None     CHL IP FREQUENCY AND DURATION 08/26/2014  Speech Therapy Frequency (ACUTE ONLY) (None)  Treatment Duration 1 week      CHL IP ORAL PHASE 08/26/2014  Lips (None)  Tongue (None)  Mucous membranes (None)  Nutritional status  (None)  Other (None)  Oxygen therapy (None)  Oral Phase Impaired  Oral - Pudding Teaspoon (None)  Oral - Pudding Cup (None)  Oral - Honey Teaspoon (None)  Oral - Honey Cup (None)  Oral - Honey Syringe (None)  Oral - Nectar Teaspoon (None)  Oral - Nectar Cup (None)  Oral - Nectar Straw (None)  Oral - Nectar Syringe (None)  Oral - Ice Chips (None)  Oral - Thin Teaspoon (None)  Oral - Thin Cup (None)  Oral - Thin Straw (None)  Oral - Thin Syringe (None)  Oral - Puree (None)  Oral - Mechanical Soft (  None)  Oral - Regular (None)  Oral - Multi-consistency (None)  Oral - Pill (None)  Oral Phase - Comment (None)      CHL IP PHARYNGEAL PHASE 08/26/2014  Pharyngeal Phase Impaired  Pharyngeal - Pudding Teaspoon (None)  Penetration/Aspiration details (pudding teaspoon) (None)  Pharyngeal - Pudding Cup (None)  Penetration/Aspiration details (pudding cup) (None)  Pharyngeal - Honey Teaspoon (None)  Penetration/Aspiration details (honey teaspoon) (None)  Pharyngeal - Honey Cup (None)  Penetration/Aspiration details (honey cup) (None)  Pharyngeal - Honey Syringe (None)  Penetration/Aspiration details (honey syringe) (None)  Pharyngeal - Nectar Teaspoon (None)  Penetration/Aspiration details (nectar teaspoon) (None)  Pharyngeal - Nectar Cup (None)  Penetration/Aspiration details (nectar cup) (None)  Pharyngeal - Nectar Straw (None)  Penetration/Aspiration details (nectar straw) (None)  Pharyngeal - Nectar Syringe (None)  Penetration/Aspiration details (nectar syringe) (None)  Pharyngeal - Ice Chips (None)  Penetration/Aspiration details (ice chips) (None)  Pharyngeal - Thin Teaspoon (None)  Penetration/Aspiration details (thin teaspoon) (None)  Pharyngeal - Thin Cup (None)  Penetration/Aspiration details (thin cup) (None)  Pharyngeal - Thin Straw (None)  Penetration/Aspiration details (thin straw) (None)  Pharyngeal - Thin Syringe (None)  Penetration/Aspiration details (thin syringe') (None)  Pharyngeal - Puree (None)   Penetration/Aspiration details (puree) (None)  Pharyngeal - Mechanical Soft (None)  Penetration/Aspiration details (mechanical soft) (None)  Pharyngeal - Regular (None)  Penetration/Aspiration details (regular) (None)  Pharyngeal - Multi-consistency (None)  Penetration/Aspiration details (multi-consistency) (None)  Pharyngeal - Pill (None)  Penetration/Aspiration details (pill) (None)  Pharyngeal Comment head turn right/left with and without chin tuck not  effective to prevent residuals nor aspiriation, fortunately pt was strong  enough to expectorate secretions mixed with barium clearing approximately  90% of barium residuals from pharynx, aspiration varied from mild to  severe amount with sensation only noted at severe amounts     CHL IP CERVICAL ESOPHAGEAL PHASE 08/26/2014  Cervical Esophageal Phase Impaired  Pudding Teaspoon (None)  Pudding Cup (None)  Honey Teaspoon (None)  Honey Cup (None)  Honey Straw (None)  Nectar Teaspoon Reduced cricopharyngeal relaxation  Nectar Cup Reduced cricopharyngeal relaxation  Nectar Straw Reduced cricopharyngeal relaxation  Nectar Sippy Cup (None)  Thin Teaspoon Reduced cricopharyngeal relaxation  Thin Cup Reduced cricopharyngeal relaxation  Thin Straw (None)  Thin Sippy Cup (None)  Cervical Esophageal Comment very poor clearance into esophagus resulting  in gross residuals and subsequent aspiration across all consistencies  *except puree    No flowsheet data found.        Donavan Burnet, MS [facility] SLP [phone]     Scheduled Meds: . antiseptic oral rinse  7 mL Mouth Rinse BID  . collagenase   Topical Daily  . latanoprost  1 drop Both Eyes QHS  . sodium chloride  3 mL Intravenous Q12H  . vitamin B-12  1,000 mcg Oral Daily  . Warfarin - Pharmacist Dosing Inpatient   Does not apply q1800   Continuous Infusions: . sodium chloride 10 mL/hr at 08/25/14 0932  . amiodarone 30 mg/hr (08/26/14 2353)  . diltiazem (CARDIZEM) infusion Stopped (08/22/14 1148)    Principal  Problem:   Acute respiratory failure Active Problems:   Chronic kidney disease, stage III (moderate)   Sepsis   Acute on chronic renal failure   Essential hypertension   Malnutrition of moderate degree   A-fib   Acute on chronic systolic heart failure   Cellulitis of leg, right   UTI (lower urinary tract infection)   Arterial hypotension   Cardiogenic shock   ADHF (  acute decompensated heart failure)   Nephrostomy complication   Pain   Pleural effusion    Time spent: 85 MINS    Pleas Koch, MD [facility] [phone]

## 2014-08-27 NOTE — Progress Notes (Signed)
Patient ID: [name], male   DOB: [birth year], 81 y.o.   MRN: [id number] [name] appears to be doing fairly well with no real complaints this morning.  He told me that the swelling in his lower extremities has diminished significantly.  He also said that he felt as if given the chance he would be able to get up and walk around a little bit today.  His nephrostomy tube continues to drain adequately.  I sat down and had a long talk with [name] today. 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 I told him that all of his medical problems, including his heart and kidneys, are somewhat analogous to his obstructed ureters in that everything that can be done to help with these conditions has been done and still they cannot be completely fixed but hopefully managed in such a way that he can be discharged from the hospital. He told me that he believed that God would take him when it was his time and he understands this and is at peace with this fact however he said he indicated that quality of life for him was of greater importance then quantity. He said he just did not want to talk about dying. I told him that his doctors have done about everything that can be done at this point.  He said he understands this and actually has seen some improvement in the swelling of his lower extremities which was his greatest complaint when he was admitted to the hospital. I told him that I completely trusted Dr. Pandora Leiter clinical knowledge and compassion for his patient's and that we had discussed his particular situation earlier today.  I recommended that he follow whatever course of action Dr. Mahala Menghini suggests for him.  Total time spent face-to-face with the patient today was approximately 25 minutes.

## 2014-08-27 NOTE — Progress Notes (Signed)
Speech Language Pathology Treatment: Dysphagia  Patient Details Name: [name] MRN: [id number] DOB: [birth year] Today's Date: 08/27/2014 Time: 4081-4481 SLP Time Calculation (min) (ACUTE ONLY): 15 min  Assessment / Plan / Recommendation Clinical Impression  SLP visit to educate pt to findings of MBS and review dysphagia mitigation strategies. Pt reports NO improvement in swallowing with thickener stating "It just ruins the drink."  Again wet vocal quality noted at baseline which can clear with pt's reflexive and cued cough.  Note pt chest imaging studies remain negative for pneumonia- indicating tolerance of aspiration.    Pt reports he consumed "too much" for breakfast.  He consumed thin Sprite followed by multiple swallows, throat clearing and delayed cough- again concerning for residuals and aspiration.    Pt with poor awareness to residuals/aspiration on MBS subsequently stating "it's ok" referring to his swallowing. Advised pt to consume water with meals due to pH neutral status and to use caution with solids.   Education completed with pt to mitigate his current swallow disability.  Advised pt to consider follow up with GI *if primary MD indicates to determine if surgical/medical tx may be indicated/helpful to address pharyngo-cervical esophageal deficits.      SLP to sign off as pt declines further SLP and education completed.      HPI Other Pertinent Information: 81 y/o male with extensive PMH including Afib on coumadin, CKD III, chronic sCHF (EF 35%), PAD presented 4/24 to [facility] ED with c/o RLE wound with concern for ischemia. Also found to have acute on CKD, elevated lactate and borderline BP and was tx to [facility] to [facility] with ?sepsis.CXR 4/24-unchanged persistent chornic right pleural effusion. MD notes concern for aspiration with esophageal dysmotility.   MBS completed revealing pharyngo=esophageal deficits with gross residuals and aspiration across all liquid  consistencies. SLP follow up to educate pt and review mitigation strategies.    Pertinent Vitals Pain Assessment:  (butt hurts, monitored during session, pt reports recently being sat up)  SLP Plan  All goals met (pt declines further SLP tx stating "it's no problem" and indicates current swallow ability is baseline x six months)    Recommendations Diet recommendations: Regular;Thin liquid (as pt denies any improvement with swallowing with use of thickener and states "It only ruins the drink") Liquids provided via: Cup Medication Administration:  (defer to pt -) Supervision: Patient able to self feed Compensations: Small sips/bites;Slow rate;Clear throat intermittently;Multiple dry swallows after each bite/sip (expectorate as needed during meal) Postural Changes and/or Swallow Maneuvers: Seated upright 90 degrees;Upright 30-60 min after meal              Oral Care Recommendations: Oral care BID Follow up Recommendations:  (consider follow up with GI when clinically well to determine if pt would benefit from medical/surgical intevention for pharyngo--cervical esophageal deficits) Plan: All goals met (pt declines further SLP tx stating "it's no problem" and indicates current swallow ability is baseline x six months)    [place], [place] [facility] SLP [phone]

## 2014-08-27 NOTE — Progress Notes (Signed)
CSW continuing to follow.   Pt discussed during unit progression meeting and pt currently on cardizem drip. Per discussion and chart review, it appears that multiple MD's have attempted to discuss goals of care with pt, but pt has not been open to discuss. Pt may benefit from palliative medicine team to address goals in order to determine what is important to pt moving forward.   CSW to continue to follow pt progress and continue to assess disposition needs when appropriate. Upon initial assessment, pt reported that he lived alone, but did not want to consider SNF at that time.   CSW to continue to follow.   Loletta Specter, MSW, LCSW Clinical Social Work [phone]

## 2014-08-27 NOTE — Progress Notes (Signed)
ANTICOAGULATION CONSULT NOTE - Follow up  Pharmacy Consult for warfarin Indication: atrial fibrillation  Allergies  Allergen Reactions  . Sulfa Antibiotics Rash    Nausea    Patient Measurements: Height:  (was not able get pt. weight bed not operating) Weight: 210 lb 8.6 oz (95.5 kg) IBW/kg (Calculated) : 73  Vital Signs: Temp: 98.4 F (36.9 C) (05/03 0800) Temp Source: Axillary (05/03 0800) BP: 93/63 mmHg (05/03 1100) Pulse Rate: 65 (05/03 1100)  Labs:  Recent Labs  08/25/14 0403  08/25/14 0933 08/26/14 0350 08/27/14 0325 08/27/14 0328  HGB  --   < > 9.5* 9.1* 9.3*  --   HCT  --   --  29.5* 28.3* 28.1*  --   PLT  --   --  120* 124* 124*  --   LABPROT 42.5*  --   --  27.6*  --  22.8*  INR 4.43*  --   --  2.55*  --  2.00*  CREATININE  --   --  3.55* 3.52* 3.59*  --   < > = values in this interval not displayed. Estimated Creatinine Clearance: 17.1 mL/min (by C-G formula based on Cr of 3.59).  Medications:  Scheduled:  . antiseptic oral rinse  7 mL Mouth Rinse BID  . collagenase   Topical Daily  . latanoprost  1 drop Both Eyes QHS  . sodium chloride  3 mL Intravenous Q12H  . vitamin B-12  1,000 mcg Oral Daily  . Warfarin - Pharmacist Dosing Inpatient   Does not apply q1800   Assessment: 81yo M presented to [facility] w/ leg swelling, suspected cellulitis, elev SCr, and supratherapeutic INR. Pharmacy was consulted to dose warfarin. Recent course of Vantin could have contributed to supratherapeutic INR. Denies significant changes in dietary intake. I confirmed his most recent warfarin regimen with him: 2.5mg  daily except nothing on Wednesdays.  Significant events: 4/25: INR improved to 3.12 after vit.K 10mg  and FFP.  4/26: INR dropped to 2. Gave warf 2.5mg . Started amiodarone. 4/27: INR took large jump, now slightly above therapeutic range 4/28: INR increased further, Doxycycline added- d/c 4/29, Amiodarone started, Diltiazem started/stopped 5/1: INR remains elevated,  Amiodarone stopped for low BP, Hematuria noted 5/2: INR now within therapeutic range (2.55) s/p vitamin K last PM, low-dose warfarin resumed  Today, 08/27/2014:  INR remains therapeutic and decreasing (2.0) as expected s/p vitamin K   Heart Healthy diet, good intake recorded  H/H and platelets low, stable. No bleeding reported/documented.  Amiodarone will increase sensitivity to warfarin as it reaches steady-state over the next couple of weeks.   Goal of Therapy:  INR 2-3   Plan:   Warfarin 2.5mg  PO today at 18:00  Check PT/INR daily.  Loralee Pacas, PharmD, BCPS Pager: [phone]  08/27/2014, 12:50 PM

## 2014-08-27 NOTE — Progress Notes (Signed)
Subjective: No chest pain, no SOB, stated he feels fine   Objective: Vital signs in last 24 hours: Temp:  [97.4 F (36.3 C)-97.8 F (36.6 C)] 97.4 F (36.3 C) (05/03 0400) Pulse Rate:  [29-159] 119 (05/03 0700) Resp:  [14-23] 17 (05/03 0700) BP: (59-101)/(42-61) 82/57 mmHg (05/03 0700) SpO2:  [92 %-100 %] 100 % (05/03 0700) Weight:  [210 lb 8.6 oz (95.5 kg)] 210 lb 8.6 oz (95.5 kg) (05/03 0600) Weight change: -1 lb 1.6 oz (-0.5 kg) Last BM Date: 08/26/14 Intake/Output from previous day: +1370 05/02 0701 - 05/03 0700 In: 1620.1 [P.O.:960; I.V.:610.1; IV Piggyback:50] Out: 276 [Urine:275; Stool:1] Intake/Output this shift:    PE: General:Pleasant affect, NAD, but does not wish to discuss dying Skin:Warm and dry, brisk capillary refill, petechial rash on back and chest  HEENT:normocephalic, sclera clear, mucus membranes moist Neck:supple, + JVD  Heart:irreg irreg without murmur, gallup, rub or click Lungs: without rales, rhonchi, + wheezes ZOX:WRUE, non tender, + BS, do not palpate liver spleen or masses Ext:3-4+ lower ext edema to hips and scrotal swelling, redness of post legs 2+ radial pulses Neuro:alert and oriented X 3, MAE, follows commands, + facial symmetry Tele:  A fib with rates 104-110 though with minimal activity to 130.   Lab Results:  Recent Labs  08/25/14 0933 08/26/14 0350  WBC 8.4 8.8  HGB 9.5* 9.1*  HCT 29.5* 28.3*  PLT 120* 124*   BMET  Recent Labs  08/25/14 0933 08/26/14 0350  NA 134* 131*  K 4.5 4.6  CL 109 107  CO2 18* 16*  GLUCOSE 87 103*  BUN 118* 121*  CREATININE 3.55* 3.52*  CALCIUM 7.9* 7.9*   No results for input(s): TROPONINI in the last 72 hours.  Invalid input(s): CK, MB  Lab Results  Component Value Date   CHOL 118 11/19/2013   HDL 39* 11/19/2013   LDLCALC 61 11/19/2013   TRIG 89 11/19/2013   CHOLHDL 3.0 11/19/2013   No results found for: HGBA1C   Lab Results  Component Value Date   TSH 4.674*  09/26/2012    Hepatic Function Panel  Recent Labs  08/26/14 0350  PROT 5.8*  ALBUMIN 2.3*  AST 17  ALT 13*  ALKPHOS 80  BILITOT 0.7   No results for input(s): CHOL in the last 72 hours. No results for input(s): PROTIME in the last 72 hours.     Studies/Results: Dg Chest Port 1 View  08/25/2014   CLINICAL DATA:  Cough with concern for aspiration  EXAM: PORTABLE CHEST - 1 VIEW  COMPARISON:  August 23, 2014  FINDINGS: There is stable cardiomegaly. Patient is status post median sternotomy. There is a right effusion which appears stable. There is trace interstitial edema, slightly less than on recent prior study. No new opacity. No adenopathy. Pulmonary vascularity within normal limits.  IMPRESSION: Trace interstitial edema, less than on recent prior study. Cardiomegaly with right effusion persist. No airspace consolidation. The appearance does suggest a degree of congestive heart failure, less than on recent prior study.   Electronically Signed   By: Bretta Bang III M.D.   On: 08/25/2014 09:19   Dg Abd Portable 1v  08/25/2014   CLINICAL DATA:  Abdominal distention  EXAM: PORTABLE ABDOMEN - 1 VIEW  COMPARISON:  CT abdomen and pelvis July 03, 2014  FINDINGS: There is a drainage catheter in the right lateral abdomen. There is no appreciable bowel dilatation or air-fluid level suggesting obstruction.  No free air is seen on this supine examination. There is a stent in the right iliac artery region. There are coils in this area as well. There is extensive arthropathy in the lumbar spine.  IMPRESSION: Bowel gas pattern unremarkable. Drain lateral right abdomen. Vascular stent and coils right pelvis.   Electronically Signed   By: Bretta Bang III M.D.   On: 08/25/2014 09:58   Dg Swallowing Func-speech Pathology  08/26/2014    Objective Swallowing Evaluation:    Patient Details  Name: [name] MRN: [id number] Date of Birth: 05-26-1925  Today's Date: 08/26/2014 Time: SLP Start Time (ACUTE ONLY):  1400-SLP Stop Time (ACUTE ONLY): 1435 SLP Time Calculation (min) (ACUTE ONLY): 35 min  Past Medical History:  Past Medical History  Diagnosis Date  . Hyperlipidemia   . White coat hypertension   . Cardiomyopathy      EF:30-40% in 06/1998, but 50-55% in 2009;  HX ISCHEMIC CARDIOMYOPATHY  . Chronic atrial fibrillation   . Vitamin B12 deficiency   . Chronic anticoagulation   . Arthritis   . Sensory neuropathy   . Chronic kidney disease, stage III (moderate)     creatinine 1.5 2001, 2.0 2009, 2.28 in 12/2009  . Hydronephrosis, bilateral   . Ureteral calculi     BILATERAL  . History of CHF (congestive heart failure)     1999  &  2005  . History of kidney stones   . Bilateral renal cysts   . Aneurysm of infrarenal abdominal aorta 3.9 x 3.6 no change 8/05 no  change in 2010;  4.7 in 2013    MONITORED BY DICKSON-- LAST VISIT OCT 2013--  4.7CM  . Chronic venous insufficiency   . PAD (peripheral artery disease)   . Edema of both legs   . History of gout     per pt stable  . Glaucoma of both eyes   . Wears hearing aid     bilateral  . CHF (congestive heart failure)   . Shortness of breath dyspnea   . Arrhythmia     Afib CHADS VASC Score of 5.   Past Surgical History:  Past Surgical History  Procedure Laterality Date  . Repair thoracic aorta  01/1995    s/p rupture  . Cataract extraction w/ intraocular lens  implant, bilateral  2012  . Endovascular right hypogastric artery aneurysm repair with graft   08-12-2003  DR DICKSON  . Laparoscopic cholecystectomy  JAN 2005  . Percutaneous nephrostolithotomy  1970's  . Abdominal aortagram  07-16-2003  DR ROTHBART    W/ COIL EMBOLIZATION OF SIX BRANCHES OF RIGHT INTERNAL RENAL ARTERY  ANEURYSM  . Cardiovascular stress test  04-25-2003    LOW RISK CARDIOLITE STUDY/ MODERATE LV DILATATION AND IMPAIRMENT OF  LVSF/  NO ISCHEMIA  . Transthoracic echocardiogram  02-07-2008  DR ROTHBART    MODERATE DILATED LV/  EF 50-55%/ MILD AR/ MILD TO MODERATE AORTIC ROOT  DILATATION/ MODERATE MR/ MODERATE LA    &  RA   DIILATED/ MILD TO MODERATE  TR  . Cystoscopy w/ ureteral stent placement Bilateral 10/24/2012    Procedure: CYSTOSCOPY WITH RETROGRADE PYELOGRAM/URETERAL STENT  PLACEMENT;  Surgeon: Lindaann Slough, MD;  Location: [facility];  Service: Urology;  Laterality: Bilateral;  . Cystoscopy with ureteroscopy Left 11/27/2012    Procedure: CYSTOSCOPY,LEFT URETEROSCOPY WITH LASER LITHOTRIPSY/REMOVAL  OF MIGRATED STENT, PLACEMENT OF LEFT STENT;  Surgeon: Garnett Farm, MD;   Location: [facility];  Service: Urology;  Laterality: Left;  . Holmium laser application Left 11/27/2012    Procedure: HOLMIUM LASER APPLICATION;  Surgeon: Garnett Farm, MD;   Location: [facility];  Service: Urology;  Laterality: Left;  . Cystoscopy with ureteroscopy and stent placement N/A 12/29/2012    Procedure: CYSTOSCOPY WITH URETEROSCOPY AND STENT PLACEMENT, removal of  right and left stents, retrogrades, right stent exchange;  Surgeon: Garnett Farm, MD;  Location: [facility];  Service: Urology;  Laterality: N/A;  . Holmium laser application Right 12/29/2012    Procedure: HOLMIUM LASER APPLICATION;  Surgeon: Garnett Farm, MD;   Location: [facility];  Service: Urology;  Laterality: Right;  HLL OF (RT)  URETERAL PELVIC JUNCTION STONE  . Cystoscopy with retrograde pyelogram, ureteroscopy and stent placement  Right 01/29/2013    Procedure: CYSTOSCOPY WITH RIGHT URETEROSCOPY AND STENT PLACEMENT;   Surgeon: Garnett Farm, MD;  Location: [facility];  Service: Urology;   Laterality: Right;  . Holmium laser application N/A 01/29/2013    Procedure: HOLMIUM LASER APPLICATION;  Surgeon: Garnett Farm, MD;   Location: [facility];  Service: Urology;  Laterality: N/A;  . Esophagogastroduodenoscopy N/A 02/16/2013    Procedure: ESOPHAGOGASTRODUODENOSCOPY (EGD);  Surgeon: Meryl Dare,  MD;  Location: [facility];  Service: Endoscopy;  Laterality: N/A;  . Cystoscopy with retrograde pyelogram, ureteroscopy and stent placement  Right 05/07/2013    Procedure:  CYSTOSCOPY WITH RIGHT RETROGRADE PYELOGRAM, Balloon dilation  of right ureter, Laser incision of right ureter, Nephrostogram;  Surgeon:  Garnett Farm, MD;  Location: [facility];  Service: Urology;  Laterality:  Right;  . Holmium laser application Right 05/07/2013    Procedure: HOLMIUM LASER APPLICATION;  Surgeon: Garnett Farm, MD;   Location: [facility];  Service: Urology;  Laterality: Right;  . Cystoscopy with retrograde pyelogram, ureteroscopy and stent placement  Left 07/15/2014    Procedure: LEFT URETEROSCOPY RETROGRADE PYELOGRAM TRANSECTION OF BLADDER  TUMOR WITH BLADDER LESION BIOPSIES;  Surgeon: Ihor Gully, MD;  Location:  [facility];  Service: Urology;  Laterality: Left;   HPI:  Other Pertinent Information: 81 y/o male with extensive PMH including Afib  on coumadin, CKD III, chronic sCHF (EF 35%), PAD presented 4/24 to [facility] ED with c/o RLE wound with concern for ischemia. Also found to have  acute on CKD, elevated lactate and borderline BP and was tx to [facility]  to PCCM with ?sepsis.CXR 4/24-unchanged persistent chornic right pleural  effusion. MD notes concern for aspiration with esophageal dysmotility.   No Data Recorded  Assessment / Plan / Recommendation Pt presents with moderate oral and severe pharyno-cervical esophageal  dysphagia.   Pt is aspirating secretions evidenced by wet vocal quality and  expectoration of viscous secretions into emesis basin during MBS. Pt  admits to issues with expectorating secretions/having a hard time  swallowing for at least six months.    Sensorimotor deficits result in gross pharyngeal  residuals(vallecular/pyriform sinus) across all consistencies.   Note pt h/o degenerative disc disease at primarily C5-C6, C6-C7 per  imaging study in 2015 impacting clearance of barium in esophagus. Suspect  chronic dysphagia for which pt managed when well but with decreased  functional reserve compensation is impacted.   Gross residuals noted across consistencies with pt  requiring up to 5  swallows to clear tsp of pudding. Aspiration (mostly silent unless  grossly) both during and after swallow with liquids despite strategies  including chin tuck and head turn. Increased viscosity resulted in  increased  residuals.   Fortunately pt able to expectorate throughout MBS and clear approximately  90% of barium residuals from pharynx- which largely is protecting his  airway. Suspect his level of dysphagia is contributing to his compromised  nutrition.  Using live video and verbal cues/instruction/teach back during MBS, SLP  educated pt to findings. Advised pt that he is at high risk of aspiration  and aspirated across all consistencies during testing despite compensation  strategies. Although pt did not aspirate puree, residuals are much worse.  If pt is to continue po, it must be with accepted risks of  aspiration/malnutrition. SLP to follow up for pt/family education and  help in care plan. Marland Kitchen   Options: NPO with oral care *ice chips after oral care Or  Continue diet with aspiration risks accepted and strategies to mitigate  risk. Pt expressed desire to this SLP to continue to eat saying "well  it's ok".   Donavan Burnet, MS [facility] SLP [phone]                CHL IP TREATMENT RECOMMENDATION 08/26/2014  Treatment Recommendations Therapy as outlined in treatment plan below     CHL IP DIET RECOMMENDATION 08/26/2014  SLP Diet Recommendations NPO;Ice chips PRN after oral care  Liquid Administration via (None)  Medication Administration Via alternative means  Compensations (None)  Postural Changes and/or Swallow Maneuvers (None)     CHL IP OTHER RECOMMENDATIONS 08/26/2014  Recommended Consults (None)  Oral Care Recommendations Oral care QID  Other Recommendations (None)     CHL IP FOLLOW UP RECOMMENDATIONS 07/04/2014  Follow up Recommendations None     CHL IP FREQUENCY AND DURATION 08/26/2014  Speech Therapy Frequency (ACUTE ONLY) (None)  Treatment Duration 1 week      CHL IP ORAL PHASE  08/26/2014  Lips (None)  Tongue (None)  Mucous membranes (None)  Nutritional status (None)  Other (None)  Oxygen therapy (None)  Oral Phase Impaired  Oral - Pudding Teaspoon (None)  Oral - Pudding Cup (None)  Oral - Honey Teaspoon (None)  Oral - Honey Cup (None)  Oral - Honey Syringe (None)  Oral - Nectar Teaspoon (None)  Oral - Nectar Cup (None)  Oral - Nectar Straw (None)  Oral - Nectar Syringe (None)  Oral - Ice Chips (None)  Oral - Thin Teaspoon (None)  Oral - Thin Cup (None)  Oral - Thin Straw (None)  Oral - Thin Syringe (None)  Oral - Puree (None)  Oral - Mechanical Soft (None)  Oral - Regular (None)  Oral - Multi-consistency (None)  Oral - Pill (None)  Oral Phase - Comment (None)      CHL IP PHARYNGEAL PHASE 08/26/2014  Pharyngeal Phase Impaired  Pharyngeal - Pudding Teaspoon (None)  Penetration/Aspiration details (pudding teaspoon) (None)  Pharyngeal - Pudding Cup (None)  Penetration/Aspiration details (pudding cup) (None)  Pharyngeal - Honey Teaspoon (None)  Penetration/Aspiration details (honey teaspoon) (None)  Pharyngeal - Honey Cup (None)  Penetration/Aspiration details (honey cup) (None)  Pharyngeal - Honey Syringe (None)  Penetration/Aspiration details (honey syringe) (None)  Pharyngeal - Nectar Teaspoon (None)  Penetration/Aspiration details (nectar teaspoon) (None)  Pharyngeal - Nectar Cup (None)  Penetration/Aspiration details (nectar cup) (None)  Pharyngeal - Nectar Straw (None)  Penetration/Aspiration details (nectar straw) (None)  Pharyngeal - Nectar Syringe (None)  Penetration/Aspiration details (nectar syringe) (None)  Pharyngeal - Ice Chips (None)  Penetration/Aspiration details (ice chips) (None)  Pharyngeal - Thin Teaspoon (None)  Penetration/Aspiration details (thin teaspoon) (None)  Pharyngeal - Thin Cup (  None)  Penetration/Aspiration details (thin cup) (None)  Pharyngeal - Thin Straw (None)  Penetration/Aspiration details (thin straw) (None)  Pharyngeal - Thin Syringe (None)   Penetration/Aspiration details (thin syringe') (None)  Pharyngeal - Puree (None)  Penetration/Aspiration details (puree) (None)  Pharyngeal - Mechanical Soft (None)  Penetration/Aspiration details (mechanical soft) (None)  Pharyngeal - Regular (None)  Penetration/Aspiration details (regular) (None)  Pharyngeal - Multi-consistency (None)  Penetration/Aspiration details (multi-consistency) (None)  Pharyngeal - Pill (None)  Penetration/Aspiration details (pill) (None)  Pharyngeal Comment head turn right/left with and without chin tuck not  effective to prevent residuals nor aspiriation, fortunately pt was strong  enough to expectorate secretions mixed with barium clearing approximately  90% of barium residuals from pharynx, aspiration varied from mild to  severe amount with sensation only noted at severe amounts     CHL IP CERVICAL ESOPHAGEAL PHASE 08/26/2014  Cervical Esophageal Phase Impaired  Pudding Teaspoon (None)  Pudding Cup (None)  Honey Teaspoon (None)  Honey Cup (None)  Honey Straw (None)  Nectar Teaspoon Reduced cricopharyngeal relaxation  Nectar Cup Reduced cricopharyngeal relaxation  Nectar Straw Reduced cricopharyngeal relaxation  Nectar Sippy Cup (None)  Thin Teaspoon Reduced cricopharyngeal relaxation  Thin Cup Reduced cricopharyngeal relaxation  Thin Straw (None)  Thin Sippy Cup (None)  Cervical Esophageal Comment very poor clearance into esophagus resulting  in gross residuals and subsequent aspiration across all consistencies  *except puree    No flowsheet data found.        Donavan Burnet, MS [facility] SLP [phone]     Medications: I have reviewed the patient's current medications. Scheduled Meds: . antiseptic oral rinse  7 mL Mouth Rinse BID  . collagenase   Topical Daily  . latanoprost  1 drop Both Eyes QHS  . sodium chloride  3 mL Intravenous Q12H  . vitamin B-12  1,000 mcg Oral Daily  . Warfarin - Pharmacist Dosing Inpatient   Does not apply q1800   Continuous Infusions: . sodium chloride  10 mL/hr at 08/25/14 0932  . amiodarone 30 mg/hr (08/26/14 2353)  . diltiazem (CARDIZEM) infusion Stopped (08/22/14 1148)   PRN Meds:.diphenhydrAMINE, HYDROcodone-acetaminophen, RESOURCE THICKENUP CLEAR, senna-docusate  Assessment/Plan: 81 year old with chronic systolic heart failure, ejection fraction 35%, persistent atrial fibrillation previously on chronic anticoagulation with Coumadin, persistent hypotension resulting in challenging control of atrial fibrillation, chronic kidney disease-no longer on digoxin.  1. Chronic systolic heart failure: Markedly volume overloaded with sepsis and hypotension with CKD now stage 5, on antimicrobial therapy. He was given one dose of IV Lasix 40 mg on 4/30 with minimal output- though Cr was 4.44. Hypotensive earlier resulting in administration of IV fluid bolus. He has marked lower extremity edema which has been a challenge to treat. I agree that ACE wraps/compression would be helpful if okay from a wound standpoint. +1370 last 24 hours, +6605 since admit with wt up from 190 to 210.   2. Atrial fibrillation, Chronic : since at least 2012 Amiodarone drip continues with HR in 110s. Once his BP improves and HR controlled, consider today starting oral amiodarone 200 mg twice a day over the next 2 weeks and then reduce to 200 mg daily after that. Amiodarone is being utilized for rate control purposes. Diltiazem was ineffective because of hypotension. No digoxin because of chronic kidney disease. No Toprol because of hypotension.  3. Chronic anticoagulation: Continue with Coumadin. INR was supratherapeutic, likely related to antibiotic use. Today 2.00 (CHA2DS2VASc score 4)  4. Massive lower extremity edema, PA pressures of 50 mmHg  systolic: This will continue to be challenging to treat, in light of hypotension related to sepsis. Likely severe chronic venous insufficiency is playing a role as well. Could try Ace wraps as noted above. Lasix has been challenging  because of hypotension with minimal effect on 4/30.  5. RV dilatation more than left with severe TR and elevated PA pressure of 50  6. Hypotension, 66/43 to 90/48 with continued lower ext edema.   Pt does not have appearance of hypotension. ? PAD playing a role in lower BPs. Last week on Neo without much change in BP.  7. CKD stage 5 Cr yesterday 3.52 slowly improving from pk of 4.44 on the 08/19/14  8. AAA 5.34 X 5.34 with dilated Also aortic root and ascending aorta dilated   9. Nephrostomy tube- exchanged yesterday, see Dr. Margrett Rud note.  10. Prognosis: per Dr. Anne Fu; "Given his cardiomyopathy, massive edema, considerable hypotension, overall cardiovascular prognosis is poor over the next year. Trying to optimize medical therapy to the best of our ability." Pt did not wish to discuss palative care with [facility] yesterday. Dr. [name] also discussed treatment plan and reached stage that he is stable.  Pt aware he may not live a long time but wants quality of life.  He again does not wish to discuss dying. Change to po amiodarone   LOS: 9 days   Time spent with pt. :15 minutes. [facility] R  Nurse Practitioner Certified Pager [phone] or after 5pm and on weekends call [phone] 08/27/2014, 7:51 AM   History and all data above reviewed.  Patient examined.  I agree with the findings as above.  His biggest complaint is his feet swelling.   The patient exam reveals AVW:[id number]  ,  Lungs: Decreased breath sounds  ,  Abd: Obese, Ext diffuse severe edema  .  All available labs, radiology testing, previous records reviewed. Agree with documented assessment and plan. Anasarca:  Options are limited by hypotension and renal insufficiency.  I had a long conversation with the patient and nurse.  She should be applying ACE wraps to his legs.  Need to keep the feet elevated.  Atrial fib:  I will switch to PO amiodarone.     Fayrene Fearing Kimora Stankovic  4:31 PM  08/27/2014

## 2014-08-27 NOTE — Progress Notes (Signed)
Date:  Aug 27, 2014 Update in condition from chart: Continuous Infusions: .  sodium chloride  10 mL/hr at 08/25/14 0932   .  amiodarone  30 mg/hr (08/26/14 2353)          Will continue to follow for Case Management needs.

## 2014-08-28 ENCOUNTER — Encounter: Payer: Self-pay | Admitting: *Deleted

## 2014-08-28 DIAGNOSIS — Z7189 Other specified counseling: Secondary | ICD-10-CM | POA: Insufficient documentation

## 2014-08-28 LAB — CBC WITH DIFFERENTIAL/PLATELET
Basophils Absolute: 0 10*3/uL (ref 0.0–0.1)
Basophils Relative: 0 % (ref 0–1)
EOS PCT: 1 % (ref 0–5)
Eosinophils Absolute: 0.1 10*3/uL (ref 0.0–0.7)
HCT: 27.6 % — ABNORMAL LOW (ref 39.0–52.0)
Hemoglobin: 9.2 g/dL — ABNORMAL LOW (ref 13.0–17.0)
LYMPHS ABS: 0.5 10*3/uL — AB (ref 0.7–4.0)
LYMPHS PCT: 5 % — AB (ref 12–46)
MCH: 30.7 pg (ref 26.0–34.0)
MCHC: 33.3 g/dL (ref 30.0–36.0)
MCV: 92 fL (ref 78.0–100.0)
MONO ABS: 0.4 10*3/uL (ref 0.1–1.0)
MONOS PCT: 4 % (ref 3–12)
Neutro Abs: 8.5 10*3/uL — ABNORMAL HIGH (ref 1.7–7.7)
Neutrophils Relative %: 90 % — ABNORMAL HIGH (ref 43–77)
Platelets: 144 10*3/uL — ABNORMAL LOW (ref 150–400)
RBC: 3 MIL/uL — AB (ref 4.22–5.81)
RDW: 20.3 % — ABNORMAL HIGH (ref 11.5–15.5)
WBC: 9.5 10*3/uL (ref 4.0–10.5)

## 2014-08-28 LAB — PROTIME-INR
INR: 2.26 — ABNORMAL HIGH (ref 0.00–1.49)
PROTHROMBIN TIME: 25.2 s — AB (ref 11.6–15.2)

## 2014-08-28 LAB — COMPREHENSIVE METABOLIC PANEL
ALT: 14 U/L — AB (ref 17–63)
AST: 19 U/L (ref 15–41)
Albumin: 2.2 g/dL — ABNORMAL LOW (ref 3.5–5.0)
Alkaline Phosphatase: 84 U/L (ref 38–126)
Anion gap: 9 (ref 5–15)
BILIRUBIN TOTAL: 0.9 mg/dL (ref 0.3–1.2)
BUN: 126 mg/dL — ABNORMAL HIGH (ref 6–20)
CHLORIDE: 107 mmol/L (ref 101–111)
CO2: 18 mmol/L — AB (ref 22–32)
Calcium: 8.4 mg/dL — ABNORMAL LOW (ref 8.9–10.3)
Creatinine, Ser: 3.58 mg/dL — ABNORMAL HIGH (ref 0.61–1.24)
GFR calc Af Amer: 16 mL/min — ABNORMAL LOW (ref >60)
GFR, EST NON AFRICAN AMERICAN: 14 mL/min — AB (ref >60)
GLUCOSE: 82 mg/dL (ref 70–99)
POTASSIUM: 4.8 mmol/L (ref 3.5–5.1)
Sodium: 134 mmol/L — ABNORMAL LOW (ref 135–145)
Total Protein: 5.9 g/dL — ABNORMAL LOW (ref 6.5–8.1)

## 2014-08-28 MED ORDER — WARFARIN SODIUM 1 MG PO TABS
1.0000 mg | ORAL_TABLET | Freq: Once | ORAL | Status: AC
  Administered 2014-08-28: 1 mg via ORAL
  Filled 2014-08-28: qty 1

## 2014-08-28 NOTE — Progress Notes (Signed)
Pt seems to be confused, but easily reoriented. Pt states he see "little ghost" in his room and he doesn't know why they would be in the hospital. Pt reoriented and explained that no ghost were in the room at this time. Pt also requesting that his head stay elevated rather than lying flat. Pt states he is able to breath better with his head up. Pt also requesting to be placed on oxygen. 2L N/C placed and sats remain in the mid 90s-100. On RA sats were also in the mid 90s. No signs of respiratory distress noted at this time.

## 2014-08-28 NOTE — Progress Notes (Signed)
PT Cancellation Note  Patient Details Name: [name] MRN: [id number] DOB: Apr 01, 1926   Cancelled Treatment:    Reason Eval/Treat Not Completed: Patient declined, no reason specified  Pt adamantly refusing mobility however reports rectal pain.  PT offered to reposition pt to more comfortable position off backside however pt states he does not want to move.   Mayley Lish,KATHrine E 08/28/2014, 10:59 AM Zenovia Jarred, PT, DPT 08/28/2014 Pager: [phone]

## 2014-08-28 NOTE — Patient Outreach (Signed)
[facility]) Care Management  08/28/2014  MOATH LUM 1925/08/29 [id number]   Received notification from Marja Kays, RN to close case due to patient being inpatient for 10 days.  Corrie Mckusick. [facility] Care Management [facility] CM Assistant Phone: [phone] Fax: [phone]

## 2014-08-28 NOTE — Progress Notes (Signed)
Subjective: Now complains of constipation but numerous stools during the night.  Also has been difficult to keep ACE wraps on lower legs.  He asked they be removed, nurse loosened and he allowed to saty on, thighs and scrotum with edema.  Objective: Vital signs in last 24 hours: Temp:  [97.5 F (36.4 C)-97.9 F (36.6 C)] 97.7 F (36.5 C) (05/04 0414) Pulse Rate:  [65-143] 113 (05/04 0600) Resp:  [15-28] 28 (05/04 0600) BP: (64-111)/(43-74) 90/56 mmHg (05/04 0600) SpO2:  [83 %-100 %] 94 % (05/04 0600) Weight:  [209 lb 10.5 oz (95.1 kg)] 209 lb 10.5 oz (95.1 kg) (05/04 0600) Weight change: -14.1 oz (-0.4 kg) Last BM Date: 08/26/14 Intake/Output from previous day: -327 05/03 0701 - 05/04 0700 In: 393.3 [I.V.:393.3] Out: 737 [Urine:735; Stool:2] Intake/Output this shift:    PE: General:Pleasant affect, NAD, though at times crying Skin:Warm and dry, brisk capillary refill, petechial bruising on upper body HEENT:normocephalic, sclera clear, mucus membranes moist Neck:supple, + JVD  Heart:irreg irreg without murmur, gallup, rub or click Lungs:clear, ant without rales, rhonchi, or wheezes ZOX:WRUE, non tender, + BS, do not palpate liver spleen or masses Ext:4+ lower ext edema including thighs and scrotum, ACE wrap on lower legs, 2+ radial pulses Neuro:alert and oriented X 3, MAE, follows commands, + facial symmetry Tele:  A fib at times in 120s but overall 100-110.   Lab Results:  Recent Labs  08/27/14 0325 08/28/14 0345  WBC 8.4 9.5  HGB 9.3* 9.2*  HCT 28.1* 27.6*  PLT 124* 144*   BMET  Recent Labs  08/27/14 0325 08/28/14 0345  NA 131* 134*  K 4.5 4.8  CL 109 107  CO2 14* 18*  GLUCOSE 87 82  BUN 120* 126*  CREATININE 3.59* 3.58*  CALCIUM 7.8* 8.4*   No results for input(s): TROPONINI in the last 72 hours.  Invalid input(s): CK, MB  Lab Results  Component Value Date   CHOL 118 11/19/2013   HDL 39* 11/19/2013   LDLCALC 61 11/19/2013   TRIG  89 11/19/2013   CHOLHDL 3.0 11/19/2013   No results found for: HGBA1C   Lab Results  Component Value Date   TSH 4.674* 09/26/2012    Hepatic Function Panel  Recent Labs  08/28/14 0345  PROT 5.9*  ALBUMIN 2.2*  AST 19  ALT 14*  ALKPHOS 84  BILITOT 0.9   No results for input(s): CHOL in the last 72 hours. No results for input(s): PROTIME in the last 72 hours.     Studies/Results: Dg Swallowing Func-speech Pathology  08/26/2014    Objective Swallowing Evaluation:    Patient Details  Name: [name] MRN: [id number] Date of Birth: Apr 12, 1926  Today's Date: 08/26/2014 Time: SLP Start Time (ACUTE ONLY): 1400-SLP Stop Time (ACUTE ONLY): 1435 SLP Time Calculation (min) (ACUTE ONLY): 35 min  Past Medical History:  Past Medical History  Diagnosis Date  . Hyperlipidemia   . White coat hypertension   . Cardiomyopathy      EF:30-40% in 06/1998, but 50-55% in 2009;  HX ISCHEMIC CARDIOMYOPATHY  . Chronic atrial fibrillation   . Vitamin B12 deficiency   . Chronic anticoagulation   . Arthritis   . Sensory neuropathy   . Chronic kidney disease, stage III (moderate)     creatinine 1.5 2001, 2.0 2009, 2.28 in 12/2009  . Hydronephrosis, bilateral   . Ureteral calculi     BILATERAL  . History of CHF (congestive heart  failure)     1999  &  2005  . History of kidney stones   . Bilateral renal cysts   . Aneurysm of infrarenal abdominal aorta 3.9 x 3.6 no change 8/05 no  change in 2010;  4.7 in 2013    MONITORED BY DICKSON-- LAST VISIT OCT 2013--  4.7CM  . Chronic venous insufficiency   . PAD (peripheral artery disease)   . Edema of both legs   . History of gout     per pt stable  . Glaucoma of both eyes   . Wears hearing aid     bilateral  . CHF (congestive heart failure)   . Shortness of breath dyspnea   . Arrhythmia     Afib CHADS VASC Score of 5.   Past Surgical History:  Past Surgical History  Procedure Laterality Date  . Repair thoracic aorta  01/1995    s/p rupture  . Cataract extraction w/ intraocular lens   implant, bilateral  2012  . Endovascular right hypogastric artery aneurysm repair with graft   08-12-2003  DR DICKSON  . Laparoscopic cholecystectomy  JAN 2005  . Percutaneous nephrostolithotomy  1970's  . Abdominal aortagram  07-16-2003  DR ROTHBART    W/ COIL EMBOLIZATION OF SIX BRANCHES OF RIGHT INTERNAL RENAL ARTERY  ANEURYSM  . Cardiovascular stress test  04-25-2003    LOW RISK CARDIOLITE STUDY/ MODERATE LV DILATATION AND IMPAIRMENT OF  LVSF/  NO ISCHEMIA  . Transthoracic echocardiogram  02-07-2008  DR ROTHBART    MODERATE DILATED LV/  EF 50-55%/ MILD AR/ MILD TO MODERATE AORTIC ROOT  DILATATION/ MODERATE MR/ MODERATE LA   &  RA   DIILATED/ MILD TO MODERATE  TR  . Cystoscopy w/ ureteral stent placement Bilateral 10/24/2012    Procedure: CYSTOSCOPY WITH RETROGRADE PYELOGRAM/URETERAL STENT  PLACEMENT;  Surgeon: Lindaann Slough, MD;  [facility]: [facility];  Service: Urology;  Laterality: Bilateral;  . Cystoscopy with ureteroscopy Left 11/27/2012    Procedure: CYSTOSCOPY,LEFT URETEROSCOPY WITH LASER LITHOTRIPSY/REMOVAL  OF MIGRATED STENT, PLACEMENT OF LEFT STENT;  Surgeon: Garnett Farm, MD;   Location: [facility];  Service: Urology;  Laterality: Left;  . Holmium laser application Left 11/27/2012    Procedure: HOLMIUM LASER APPLICATION;  Surgeon: Garnett Farm, MD;   Location: [facility];  Service: Urology;  Laterality: Left;  . Cystoscopy with ureteroscopy and stent placement N/A 12/29/2012    Procedure: CYSTOSCOPY WITH URETEROSCOPY AND STENT PLACEMENT, removal of  right and left stents, retrogrades, right stent exchange;  Surgeon: Garnett Farm, MD;  Location: [facility];  Service: Urology;  Laterality: N/A;  . Holmium laser application Right 12/29/2012    Procedure: HOLMIUM LASER APPLICATION;  Surgeon: Garnett Farm, MD;   Location: [facility];  Service: Urology;  Laterality: Right;  HLL OF (RT)  URETERAL PELVIC JUNCTION STONE  . Cystoscopy with retrograde pyelogram, ureteroscopy and stent placement  Right 01/29/2013     Procedure: CYSTOSCOPY WITH RIGHT URETEROSCOPY AND STENT PLACEMENT;   Surgeon: Garnett Farm, MD;  Location: [facility];  Service: Urology;   Laterality: Right;  . Holmium laser application N/A 01/29/2013    Procedure: HOLMIUM LASER APPLICATION;  Surgeon: Garnett Farm, MD;   Location: [facility];  Service: Urology;  Laterality: N/A;  . Esophagogastroduodenoscopy N/A 02/16/2013    Procedure: ESOPHAGOGASTRODUODENOSCOPY (EGD);  Surgeon: Meryl Dare,  MD;  Location: [facility];  Service: Endoscopy;  Laterality: N/A;  . Cystoscopy with retrograde pyelogram,  ureteroscopy and stent placement  Right 05/07/2013    Procedure: CYSTOSCOPY WITH RIGHT RETROGRADE PYELOGRAM, Balloon dilation  of right ureter, Laser incision of right ureter, Nephrostogram;  Surgeon:  Garnett Farm, MD;  Location: [facility];  Service: Urology;  Laterality:  Right;  . Holmium laser application Right 05/07/2013    Procedure: HOLMIUM LASER APPLICATION;  Surgeon: Garnett Farm, MD;   Location: [facility];  Service: Urology;  Laterality: Right;  . Cystoscopy with retrograde pyelogram, ureteroscopy and stent placement  Left 07/15/2014    Procedure: LEFT URETEROSCOPY RETROGRADE PYELOGRAM TRANSECTION OF BLADDER  TUMOR WITH BLADDER LESION BIOPSIES;  Surgeon: Ihor Gully, MD;  Location:  [facility];  Service: Urology;  Laterality: Left;   HPI:  Other Pertinent Information: 81 y/o male with extensive PMH including Afib  on coumadin, CKD III, chronic sCHF (EF 35%), PAD presented 4/24 to [facility] ED with c/o RLE wound with concern for ischemia. Also found to have  acute on CKD, elevated lactate and borderline BP and was tx to [facility]  to PCCM with ?sepsis.CXR 4/24-unchanged persistent chornic right pleural  effusion. MD notes concern for aspiration with esophageal dysmotility.   No Data Recorded  Assessment / Plan / Recommendation Pt presents with moderate oral and severe pharyno-cervical esophageal  dysphagia.   Pt is aspirating secretions evidenced by wet vocal  quality and  expectoration of viscous secretions into emesis basin during MBS. Pt  admits to issues with expectorating secretions/having a hard time  swallowing for at least six months.    Sensorimotor deficits result in gross pharyngeal  residuals(vallecular/pyriform sinus) across all consistencies.   Note pt h/o degenerative disc disease at primarily C5-C6, C6-C7 per  imaging study in 2015 impacting clearance of barium in esophagus. Suspect  chronic dysphagia for which pt managed when well but with decreased  functional reserve compensation is impacted.   Gross residuals noted across consistencies with pt requiring up to 5  swallows to clear tsp of pudding. Aspiration (mostly silent unless  grossly) both during and after swallow with liquids despite strategies  including chin tuck and head turn. Increased viscosity resulted in  increased residuals.   Fortunately pt able to expectorate throughout MBS and clear approximately  90% of barium residuals from pharynx- which largely is protecting his  airway. Suspect his level of dysphagia is contributing to his compromised  nutrition.  Using live video and verbal cues/instruction/teach back during MBS, SLP  educated pt to findings. Advised pt that he is at high risk of aspiration  and aspirated across all consistencies during testing despite compensation  strategies. Although pt did not aspirate puree, residuals are much worse.  If pt is to continue po, it must be with accepted risks of  aspiration/malnutrition. SLP to follow up for pt/family education and  help in care plan. Marland Kitchen   Options: NPO with oral care *ice chips after oral care Or  Continue diet with aspiration risks accepted and strategies to mitigate  risk. Pt expressed desire to this SLP to continue to eat saying "well  it's ok".   Donavan Burnet, MS [facility] SLP [phone]                [facility] IP TREATMENT RECOMMENDATION 08/26/2014  Treatment Recommendations Therapy as outlined in treatment plan  below     CHL IP DIET RECOMMENDATION 08/26/2014  SLP Diet Recommendations NPO;Ice chips PRN after oral care  Liquid Administration via (None)  Medication Administration Via alternative means  Compensations (  None)  Postural Changes and/or Swallow Maneuvers (None)     CHL IP OTHER RECOMMENDATIONS 08/26/2014  Recommended Consults (None)  Oral Care Recommendations Oral care QID  Other Recommendations (None)     CHL IP FOLLOW UP RECOMMENDATIONS 07/04/2014  Follow up Recommendations None     CHL IP FREQUENCY AND DURATION 08/26/2014  Speech Therapy Frequency (ACUTE ONLY) (None)  Treatment Duration 1 week      CHL IP ORAL PHASE 08/26/2014  Lips (None)  Tongue (None)  Mucous membranes (None)  Nutritional status (None)  Other (None)  Oxygen therapy (None)  Oral Phase Impaired  Oral - Pudding Teaspoon (None)  Oral - Pudding Cup (None)  Oral - Honey Teaspoon (None)  Oral - Honey Cup (None)  Oral - Honey Syringe (None)  Oral - Nectar Teaspoon (None)  Oral - Nectar Cup (None)  Oral - Nectar Straw (None)  Oral - Nectar Syringe (None)  Oral - Ice Chips (None)  Oral - Thin Teaspoon (None)  Oral - Thin Cup (None)  Oral - Thin Straw (None)  Oral - Thin Syringe (None)  Oral - Puree (None)  Oral - Mechanical Soft (None)  Oral - Regular (None)  Oral - Multi-consistency (None)  Oral - Pill (None)  Oral Phase - Comment (None)      CHL IP PHARYNGEAL PHASE 08/26/2014  Pharyngeal Phase Impaired  Pharyngeal - Pudding Teaspoon (None)  Penetration/Aspiration details (pudding teaspoon) (None)  Pharyngeal - Pudding Cup (None)  Penetration/Aspiration details (pudding cup) (None)  Pharyngeal - Honey Teaspoon (None)  Penetration/Aspiration details (honey teaspoon) (None)  Pharyngeal - Honey Cup (None)  Penetration/Aspiration details (honey cup) (None)  Pharyngeal - Honey Syringe (None)  Penetration/Aspiration details (honey syringe) (None)  Pharyngeal - Nectar Teaspoon (None)  Penetration/Aspiration details (nectar teaspoon) (None)  Pharyngeal - Nectar Cup  (None)  Penetration/Aspiration details (nectar cup) (None)  Pharyngeal - Nectar Straw (None)  Penetration/Aspiration details (nectar straw) (None)  Pharyngeal - Nectar Syringe (None)  Penetration/Aspiration details (nectar syringe) (None)  Pharyngeal - Ice Chips (None)  Penetration/Aspiration details (ice chips) (None)  Pharyngeal - Thin Teaspoon (None)  Penetration/Aspiration details (thin teaspoon) (None)  Pharyngeal - Thin Cup (None)  Penetration/Aspiration details (thin cup) (None)  Pharyngeal - Thin Straw (None)  Penetration/Aspiration details (thin straw) (None)  Pharyngeal - Thin Syringe (None)  Penetration/Aspiration details (thin syringe') (None)  Pharyngeal - Puree (None)  Penetration/Aspiration details (puree) (None)  Pharyngeal - Mechanical Soft (None)  Penetration/Aspiration details (mechanical soft) (None)  Pharyngeal - Regular (None)  Penetration/Aspiration details (regular) (None)  Pharyngeal - Multi-consistency (None)  Penetration/Aspiration details (multi-consistency) (None)  Pharyngeal - Pill (None)  Penetration/Aspiration details (pill) (None)  Pharyngeal Comment head turn right/left with and without chin tuck not  effective to prevent residuals nor aspiriation, fortunately pt was strong  enough to expectorate secretions mixed with barium clearing approximately  90% of barium residuals from pharynx, aspiration varied from mild to  severe amount with sensation only noted at severe amounts     CHL IP CERVICAL ESOPHAGEAL PHASE 08/26/2014  Cervical Esophageal Phase Impaired  Pudding Teaspoon (None)  Pudding Cup (None)  Honey Teaspoon (None)  Honey Cup (None)  Honey Straw (None)  Nectar Teaspoon Reduced cricopharyngeal relaxation  Nectar Cup Reduced cricopharyngeal relaxation  Nectar Straw Reduced cricopharyngeal relaxation  Nectar Sippy Cup (None)  Thin Teaspoon Reduced cricopharyngeal relaxation  Thin Cup Reduced cricopharyngeal relaxation  Thin Straw (None)  Thin Sippy Cup (None)  Cervical Esophageal  Comment very poor clearance into esophagus resulting  in gross residuals and subsequent aspiration across all consistencies  *except puree    No flowsheet data found.        Donavan Burnet, MS [facility] SLP [phone]     Medications: I have reviewed the patient's current medications. Scheduled Meds: . amiodarone  400 mg Oral BID  . antiseptic oral rinse  7 mL Mouth Rinse BID  . collagenase   Topical Daily  . latanoprost  1 drop Both Eyes QHS  . sodium bicarbonate  650 mg Oral BID  . sodium chloride  3 mL Intravenous Q12H  . vitamin B-12  1,000 mcg Oral Daily  . Warfarin - Pharmacist Dosing Inpatient   Does not apply q1800   Continuous Infusions: . sodium chloride 10 mL/hr at 08/25/14 0932   PRN Meds:.diphenhydrAMINE, HYDROcodone-acetaminophen, RESOURCE THICKENUP CLEAR, senna-docusate  Assessment/Plan: 81 year old with chronic systolic heart failure, ejection fraction 35%, persistent atrial fibrillation previously on chronic anticoagulation with Coumadin, persistent hypotension resulting in challenging control of atrial fibrillation, chronic kidney disease-no longer on digoxin.  1. Chronic systolic heart failure: Markedly volume overloaded with sepsis and hypotension with CKD now stage 5, on antimicrobial therapy. He was given one dose of IV Lasix 40 mg on 4/30 with minimal output- though Cr was 4.44. Hypotensive earlier in stay resulting in administration of IV fluid bolus. He has marked lower extremity edema which has been a challenge to treat. ACE wraps/compression would be helpful if okay from a wound standpoint. -327 last 24 hours, +6278 since admit with wt up from 190 to 210--> 209.10 today.   2. Atrial fibrillation, Chronic : since at least 2012 Amiodarone now po amiodarone 400 mg twice a day over the next 2 weeks and then reduce to 200 mg daily after that. Amiodarone is being utilized for rate control purposes. Diltiazem was ineffective because of hypotension. No digoxin because of chronic  kidney disease. No Toprol because of hypotension. Monitor LFTs with amiodarone  3. Chronic anticoagulation: Continue with Coumadin. INR was supratherapeutic, likely related to antibiotic use. Today 2.26 after one dose of coumadin (had been on hold) (CHA2DS2VASc score 4)  4. Massive lower extremity edema, PA pressures of 50 mmHg systolic: This will continue to be challenging to treat, in light of hypotension related to sepsis. Likely severe chronic venous insufficiency is playing a role as well. Lasix has been challenging because of hypotension with minimal effect on 4/30.  5. RV dilatation more than left with severe TR and elevated PA pressure of 50  6. Hypotension, 75/43 to 111/54 with continued lower ext edema. Pt does not have appearance of hypotension- though he is having some confusion and hallucination of "little ghost". ? PAD playing a role in lower BPs. Last week on Neo without much change in BP.  7. CKD stage 5 Cr Monday 3.52 slowly improving from pk of 4.44 on the 08/19/14, --> today 3.58  8. AAA 5.34 X 5.34 with dilated Also aortic root and ascending aorta dilated   9. Nephrostomy tube- exchanged Monday, see Dr. Margrett Rud note.  10. Prognosis: per Dr. Anne Fu; "Given his cardiomyopathy, massive edema, considerable hypotension, overall cardiovascular prognosis is poor over the next year. Trying to optimize medical therapy to the best of our ability." Pt did not wish to discuss palative care with TRH yesterday. Dr. [name] also discussed treatment plan and reached stage that he is stable. Pt aware he may not live a long time but wants quality of life. He again does not wish to discuss dying. Change to  po amiodarone      LOS: 10 days   Time spent with pt. :15 minutes. [facility]  Nurse Practitioner Certified Pager [phone] or after 5pm and on weekends call [phone] 08/28/2014, 9:08 AM  History and all data above reviewed.  Patient examined.  I agree with the findings as  above.  He is weaker daily.  No acute distress but clearly failing.  Slightly less edema with legs wrapped and feet up.  The patient exam reveals VZD:GLOVF, irregular  ,  Lungs: Decreased breath sounds  ,  Abd: Decreased breath sounds, Ext diffuse edema  .  All available labs, radiology testing, previous records reviewed. Agree with documented assessment and plan. Anasarca:  We really have no medical therapy to offer secondary to hypotension and multisystem organ failure.  I am sure that the primary team will continue to have the discussion about possible hospice care at home.  Continue conservative mechanical measures and we will see him as needed.    Fayrene Fearing Braxden Lovering  11:16 AM  08/28/2014

## 2014-08-28 NOTE — Progress Notes (Signed)
ANTICOAGULATION CONSULT NOTE - Follow up  Pharmacy Consult for warfarin Indication: atrial fibrillation  Allergies  Allergen Reactions  . Sulfa Antibiotics Rash    Nausea    Patient Measurements: Height:  (was not able get pt. weight bed not operating) Weight: 209 lb 10.5 oz (95.1 kg) IBW/kg (Calculated) : 73  Vital Signs: Temp: 97.7 F (36.5 C) (05/04 0414) Temp Source: Oral (05/04 0414) BP: 85/68 mmHg (05/04 0900) Pulse Rate: 127 (05/04 0900)  Labs:  Recent Labs  08/26/14 0350 08/27/14 0325 08/27/14 0328 08/28/14 0345  HGB 9.1* 9.3*  --  9.2*  HCT 28.3* 28.1*  --  27.6*  PLT 124* 124*  --  144*  LABPROT 27.6*  --  22.8* 25.2*  INR 2.55*  --  2.00* 2.26*  CREATININE 3.52* 3.59*  --  3.58*   Estimated Creatinine Clearance: 17.1 mL/min (by C-G formula based on Cr of 3.58).  Medications:  Scheduled:  . amiodarone  400 mg Oral BID  . antiseptic oral rinse  7 mL Mouth Rinse BID  . collagenase   Topical Daily  . latanoprost  1 drop Both Eyes QHS  . sodium bicarbonate  650 mg Oral BID  . sodium chloride  3 mL Intravenous Q12H  . vitamin B-12  1,000 mcg Oral Daily  . warfarin  1 mg Oral ONCE-1800  . Warfarin - Pharmacist Dosing Inpatient   Does not apply q1800   Assessment: 81yo M presented to [facility] w/ leg swelling, suspected cellulitis, elev SCr, and supratherapeutic INR. Pharmacy was consulted to dose warfarin. Recent course of Vantin could have contributed to supratherapeutic INR. Denies significant changes in dietary intake. I confirmed his most recent warfarin regimen with him: 2.5mg  daily except nothing on Wednesdays.  Significant events: 4/25: INR improved to 3.12 after vit.K 10mg  and FFP.  4/26: INR dropped to 2. Gave warf 2.5mg . Started amiodarone. 4/27: INR took large jump, now slightly above therapeutic range 4/28: INR increased further, Doxycycline added- d/c 4/29, Amiodarone started, Diltiazem started/stopped 5/1: INR remains elevated, Amiodarone  stopped for low BP, Hematuria noted 5/2: INR now within therapeutic range (2.55) s/p vitamin K last PM, low-dose warfarin resumed  Today, 08/28/2014:  INR remains therapeutic and rising. Appears patient did not receive dose on 5/3.    Diet: Cardiac, thickened liquids  H/H and platelets low, but stable. No bleeding reported/documented.  Amiodarone will increase sensitivity to warfarin as it reaches steady-state over the next couple of weeks.   Goal of Therapy:  INR 2-3   Plan:   Warfarin 1mg  PO today at 18:00  Check PT/INR daily.  Monitor for s/s of bleeding.   Greer Pickerel, PharmD, BCPS Pager: [phone] 08/28/2014 3:29 PM

## 2014-08-28 NOTE — Progress Notes (Signed)
CSW continuing to follow.   CSW visited pt room today to provide support and further discuss what pt wishes will be once he is able to leave the hospital.   Pt easily agitated during discussion and discussed that "he doesn't know" and if he can get back to how he was prior to admission then he will be able to return home. CSW discussed with pt that it is likely that he will not be in a position to be able to care for himself at home and encouraged pt to think about what his wishes would be in order for pt to be cared for. Pt stated that his family was here earlier and that he is hopeful that they will return today as he would prefer to have company at the moment.   CSW exited pt room and spoke with RN. RN reports that pt family was present earlier and MD was able to speak with pt granddaughter and MD placed palliative care consult. Per RN, pt nephew stated that there is no family that can assist pt at home. CSW recommends family involvement in PMT GOC discussion.  CSW to continue to follow and assist as appropriate.  Loletta Specter, MSW, LCSW Clinical Social Work [phone]

## 2014-08-28 NOTE — Progress Notes (Signed)
PROGRESS NOTE  TAVAR LEVANT XHF:[id number] DOB: 10-13-1925 DOA: 2014/08/31 PCP: Lubertha South, MD  HPI: Patient admitted 4/25 on PCCM service with severe sepsis requiring pressors, acute on chronic CHF, A fib with RVR, coagulopathy with INR > 10, cellulitis. He was seen by Cardiology as well and found to have Chr Systolic HF with new-onset Afib this admission-? 2/2 to Tachyarrythmia related CM. He has been diuresed per Cardiology but has a poor long-term prognosis given inability to use rate controlling agents as well as massive anasarca limiting diuretic use to control edema. His Perc Neprhostomy seeemd to be more bloody overnight 4/30 and patient experienced hypotension requiring an IV saline bolus, Amio gtt was turned off. He has been persistingly hypotensiv.   Subjective / 24 H Interval events - without reported overnight events - patient denies chest pain, dyspnea - wants to go home but wants to be able to walk before that.   Assessment/Plan: Principal Problem:   Acute respiratory failure Active Problems:   Chronic kidney disease, stage III (moderate)   Sepsis   Acute on chronic renal failure   Essential hypertension   Malnutrition of moderate degree   A-fib   Acute on chronic systolic heart failure   Cellulitis of leg, right   UTI (lower urinary tract infection)   Arterial hypotension   Cardiogenic shock   ADHF (acute decompensated heart failure)   Nephrostomy complication   Pain   Pleural effusion   Aspiration into airway   Attention to urostomy  Sepsis - on admission, requiring pressors, was on PCCM service - Secondary to Pseudomonas UTI, completed Cefepime on 5/2  Chronic right hydronephrosis secondary to ureteral obstruction  - managed with nephrostomy tube, chronic for at least 1 year - d/w over the phone with Dr. [name] today  - nephrostomy tube migrated, for exchange by IR 5/4  A. fib with RVR - Likely secondary to sepsis.  - Patient had a 2-D echo done  early on in this hospitalization which showed a EF of 30 to 35% with no wall motion abnormalities.  - cardiology following, currently on Amiodarone, heart rate still elevated  Acute hypoxic respiratory failure  - secondary to A fib with RVR and acute on chronic systolic CHF.  - significantly volume overloaded/anasarca - Stockings placed - Cannot be diuresed due to hypotension  Acute on chronic systolic heart failure - in the setting of A fib with RVR, sepsis on admission  Coagulopathy  - ? In the setting of poor liver perfusion  Esophageal stricture - Eventually might benefit from Esophageal dilatation if stabilizes - clinically not a candidate currently for any intervention  Acute on chronic renal failure with non anion gap metabolic acidosis - Likely secondary to cardiorenal syndrome.  - Patient with a poor ejection fraction and currently in acute heart failure with A. fib with RVR.  - worsening  Anemia - Likely anemia of chronic disease. No overt bleeding. Follow H&H.  Thrombocytopenia - Likely secondary to acute infection - stable   Right lower extremity wound - Wound care consult appreciated - Rec's -4 cm x 2 cm wound bed is 100% eschar, separated from wound margins, boggy and fluctuant Cleanse right lateral lower leg with NS and pat gently dry. Apply Santyl ointment, 1/8 inch thickness (opaque). Top with NS moist dressing. Cover with 4x4 gauze and secure with kerlix and tape. Change daily.   Constipation - Senokot-S.  Goals of care - extensive discussion today with patient at bedside regarding his current  clinical condition and trajectory. He has significant fluid overload and his borderline blood pressure prevents diuresis. His heart rate is difficult to control as we are limited in using BB or CCB. He is on Amiodarone and cardiology is following. He has renal failure and is worsening. His short term prognosis is very guarded. Dr. Mahala Menghini discussed with patient  on 5/3 and agreed to a DNR. He understands that he is dying and wants to be home however wants to walk again before leaving the hospital. He declines hospice since "hospice kills people". I have discussed with patient as well as his granddaughter (POA) over the phone, I recommend palliative consultation, called today.    Diet: DIET - DYS 1 Room service appropriate?: Yes; Fluid consistency:: Thin Fluids: none DVT Prophylaxis: Warfarin  Code Status: DNR Family Communication: d/w granddaughter Roseanne Reno over the phone cell [phone]  Disposition Plan: remain inpatient  Consultants:  Cardiology   Urology   Procedures:  2D echo   Antibiotics  IV cefepime 08/21/2014   IV Zosyn 08-19-14>>>> 08/21/2014  IV vancomycin 08-19-2014>>>> 08/21/2014  Oral doxycycline 08/22/2014>>>08/23/14   Studies   2-D echo 08/20/2014  Chest x-ray 08-19-14, 08/19/2014, 08/20/2014, 08/21/2014  X-ray of the hip 08/19/2014  X-ray of the tib-fib 08/19/2014 neg for acute fx, mild knee/ankle joint osteo, extensive peripheral vascular calcification, focal soft tissue swelling  Objective  Filed Vitals:   08/28/14 0400 08/28/14 0414 08/28/14 0430 08/28/14 0600  BP: 75/43  78/49 90/56  Pulse: 127  129 113  Temp:  97.7 F (36.5 C)    TempSrc:  Oral    Resp: Height:      Weight:    95.1 kg (209 lb 10.5 oz)  SpO2: 94%  95% 94%    Intake/Output Summary (Last 24 hours) at 08/28/14 0728 Last data filed at 08/28/14 0600  Gross per 24 hour  Intake  393.3 ml  Output    737 ml  Net -343.7 ml   Filed Weights   08/26/14 0500 08/27/14 0600 08/28/14 0600  Weight: 96 kg (211 lb 10.3 oz) 95.5 kg (210 lb 8.6 oz) 95.1 kg (209 lb 10.5 oz)   Exam:  General:  Ill appearing  HEENT: no scleral icterus, PERRL  Cardiovascular: irregular, tachycardic, +JVD, 2+ pitting LE edema  Respiratory: shallow breathing, no wheezing, basilar crackles  Abdomen: soft, non tender, BS +, no  guarding  MSK/Extremities: no clubbing/cyanosis, no joint swelling  Skin: no rashes  Neuro: non focal   Data Reviewed: Basic Metabolic Panel:  Recent Labs Lab 08/24/14 0350 08/25/14 0933 08/26/14 0350 08/27/14 0325 08/28/14 0345  NA 136 134* 131* 131* 134*  K 4.4 4.5 4.6 4.5 4.8  CL 107 109 107 109 107  CO2 17* 18* 16* 14* 18*  GLUCOSE 116* 87 103* 87 82  BUN 123* 118* 121* 120* 126*  CREATININE 3.63* 3.55* 3.52* 3.59* 3.58*  CALCIUM 8.3* 7.9* 7.9* 7.8* 8.4*   Liver Function Tests:  Recent Labs Lab 08/25/14 0933 08/26/14 0350 08/27/14 0325 08/28/14 0345  AST 13* 17 14* 19  ALT 12* 13* 12* 14*  ALKPHOS 68 80 74 84  BILITOT 0.7 0.7 0.8 0.9  PROT 6.1* 5.8* 5.8* 5.9*  ALBUMIN 2.3* 2.3* 2.3* 2.2*   CBC:  Recent Labs Lab 08/24/14 0350 08/25/14 0933 08/26/14 0350 08/27/14 0325 08/28/14 0345  WBC 8.2 8.4 8.8 8.4 9.5  NEUTROABS 7.0 7.5 7.6 7.4 8.5*  HGB 9.8* 9.5* 9.1* 9.3* 9.2*  HCT 29.9*  29.5* 28.3* 28.1* 27.6*  MCV 94.0 94.2 94.0 92.7 92.0  PLT 107* 120* 124* 124* 144*   Cardiac Enzymes:  Recent Labs Lab 08/22/14 1230 08/22/14 1724 08/22/14 2320  TROPONINI <0.03 0.03 0.03   BNP (last 3 results)  Recent Labs  08/21/14 0350 08/22/14 1230 08/23/14 0522  BNP 430.2* 647.7* 1201.9*    ProBNP (last 3 results)  Recent Labs  04/09/14 1038  PROBNP 9656.0*    Recent Results (from the past 240 hour(s))  Blood Culture (routine x 2)     Status: None   Collection Time: 09/17/2014  8:58 PM  Result Value Ref Range Status   Specimen Description BLOOD LEFT HAND  Final   Special Requests BOTTLES DRAWN AEROBIC ONLY 7CC  Final   Culture NO GROWTH 6 DAYS  Final   Report Status 08/24/2014 FINAL  Final  Blood Culture (routine x 2)     Status: None   Collection Time: Sep 17, 2014  9:05 PM  Result Value Ref Range Status   Specimen Description BLOOD LEFT HAND  Final   Special Requests BOTTLES DRAWN AEROBIC ONLY 7CC  Final   Culture NO GROWTH 6 DAYS  Final    Report Status 08/24/2014 FINAL  Final  Urine culture     Status: None   Collection Time: 2014-09-17 11:33 PM  Result Value Ref Range Status   Specimen Description URINE, CATHETERIZED  Final   Special Requests NONE  Final   Colony Count   Final    >=100,000 COLONIES/ML Performed at [facility]    Culture   Final    PSEUDOMONAS AERUGINOSA Performed at [facility]    Report Status 08/21/2014 FINAL  Final   Organism ID, Bacteria PSEUDOMONAS AERUGINOSA  Final      Susceptibility   Pseudomonas aeruginosa - MIC*    CEFEPIME 4 SENSITIVE Sensitive     CEFTAZIDIME 16 INTERMEDIATE Intermediate     CIPROFLOXACIN <=0.25 SENSITIVE Sensitive     GENTAMICIN <=1 SENSITIVE Sensitive     IMIPENEM 1 SENSITIVE Sensitive     TOBRAMYCIN <=1 SENSITIVE Sensitive     * PSEUDOMONAS AERUGINOSA  Urine culture     Status: None   Collection Time: 08/19/14  2:48 AM  Result Value Ref Range Status   Specimen Description URINE, SUPRAPUBIC  Final   Special Requests NONE  Final   Colony Count   Final    >=100,000 COLONIES/ML Performed at [facility]    Culture   Final    PSEUDOMONAS AERUGINOSA Performed at [facility]    Report Status 08/21/2014 FINAL  Final   Organism ID, Bacteria PSEUDOMONAS AERUGINOSA  Final      Susceptibility   Pseudomonas aeruginosa - MIC*    CEFEPIME 8 SENSITIVE Sensitive     CEFTAZIDIME 16 INTERMEDIATE Intermediate     CIPROFLOXACIN <=0.25 SENSITIVE Sensitive     GENTAMICIN <=1 SENSITIVE Sensitive     IMIPENEM 1 SENSITIVE Sensitive     TOBRAMYCIN <=1 SENSITIVE Sensitive     * PSEUDOMONAS AERUGINOSA  MRSA PCR Screening     Status: None   Collection Time: 08/19/14  3:11 AM  Result Value Ref Range Status   MRSA by PCR NEGATIVE NEGATIVE Final    Comment:        The GeneXpert MRSA Assay (FDA approved for NASAL specimens only), is one component of a comprehensive MRSA colonization surveillance program. It is not intended to diagnose  MRSA infection nor to guide or monitor treatment  for MRSA infections.      Scheduled Meds: . amiodarone  400 mg Oral BID  . antiseptic oral rinse  7 mL Mouth Rinse BID  . collagenase   Topical Daily  . latanoprost  1 drop Both Eyes QHS  . sodium bicarbonate  650 mg Oral BID  . sodium chloride  3 mL Intravenous Q12H  . vitamin B-12  1,000 mcg Oral Daily  . Warfarin - Pharmacist Dosing Inpatient   Does not apply q1800   Continuous Infusions: . sodium chloride 10 mL/hr at 08/25/14 0932   Time spent: 35 minutes, more than 50% bedside for counseling / discussions / goals of care  Pamella Pert, MD [facility] Pager [phone]. If 7 PM - 7 AM, please contact night-coverage at [email or web address], password [facility] 08/28/2014, 7:28 AM  LOS: 10 days

## 2014-08-28 NOTE — Progress Notes (Signed)
Palliative medicine team consult received. I have reviewed the chart in detail. Patient has been opposed to any involvement of hospice or palliative care- he has poor insight into his condition and prognosis and may not have realistic expectations in terms of his functional status and ability to maintain his independence. Given this disconnect it will be important to have family at bedside for additional goals of care discussions. I spoke with his HCPOA Roseanne Reno, Information systems manager, who lives in [place]- she cannot come to [place] until Friday5/6 but is going to arrange for her sisters to be present and phone her in on the conversation tomorrow 5/5- awaiting return call back.   Anderson Malta, DO Palliative Medicine [phone]

## 2014-08-29 ENCOUNTER — Inpatient Hospital Stay (HOSPITAL_COMMUNITY): Payer: Medicare Other

## 2014-08-29 DIAGNOSIS — Z515 Encounter for palliative care: Secondary | ICD-10-CM

## 2014-08-29 DIAGNOSIS — I4891 Unspecified atrial fibrillation: Secondary | ICD-10-CM | POA: Insufficient documentation

## 2014-08-29 LAB — COMPREHENSIVE METABOLIC PANEL
ALBUMIN: 2.2 g/dL — AB (ref 3.5–5.0)
ALT: 14 U/L — ABNORMAL LOW (ref 17–63)
AST: 18 U/L (ref 15–41)
Alkaline Phosphatase: 85 U/L (ref 38–126)
Anion gap: 12 (ref 5–15)
BILIRUBIN TOTAL: 0.8 mg/dL (ref 0.3–1.2)
BUN: 132 mg/dL — ABNORMAL HIGH (ref 6–20)
CALCIUM: 8.2 mg/dL — AB (ref 8.9–10.3)
CO2: 16 mmol/L — ABNORMAL LOW (ref 22–32)
Chloride: 107 mmol/L (ref 101–111)
Creatinine, Ser: 3.73 mg/dL — ABNORMAL HIGH (ref 0.61–1.24)
GFR calc Af Amer: 16 mL/min — ABNORMAL LOW (ref >60)
GFR calc non Af Amer: 13 mL/min — ABNORMAL LOW (ref >60)
Glucose, Bld: 85 mg/dL (ref 70–99)
POTASSIUM: 4.8 mmol/L (ref 3.5–5.1)
SODIUM: 135 mmol/L (ref 135–145)
Total Protein: 5.8 g/dL — ABNORMAL LOW (ref 6.5–8.1)

## 2014-08-29 LAB — CBC
HCT: 27.5 % — ABNORMAL LOW (ref 39.0–52.0)
Hemoglobin: 9.2 g/dL — ABNORMAL LOW (ref 13.0–17.0)
MCH: 30.5 pg (ref 26.0–34.0)
MCHC: 33.5 g/dL (ref 30.0–36.0)
MCV: 91.1 fL (ref 78.0–100.0)
Platelets: 146 10*3/uL — ABNORMAL LOW (ref 150–400)
RBC: 3.02 MIL/uL — ABNORMAL LOW (ref 4.22–5.81)
RDW: 20.3 % — AB (ref 11.5–15.5)
WBC: 9.2 10*3/uL (ref 4.0–10.5)

## 2014-08-29 LAB — PROTIME-INR
INR: 2.55 — AB (ref 0.00–1.49)
Prothrombin Time: 27.6 seconds — ABNORMAL HIGH (ref 11.6–15.2)

## 2014-08-29 MED ORDER — IOHEXOL 300 MG/ML  SOLN
50.0000 mL | Freq: Once | INTRAMUSCULAR | Status: AC | PRN
Start: 2014-08-29 — End: 2014-08-29
  Administered 2014-08-29: 20 mL

## 2014-08-29 MED ORDER — WARFARIN 0.5 MG HALF TABLET
0.5000 mg | ORAL_TABLET | Freq: Once | ORAL | Status: AC
  Administered 2014-08-29: 0.5 mg via ORAL
  Filled 2014-08-29 (×2): qty 1

## 2014-08-29 NOTE — Procedures (Signed)
Successful exchg and reposition of the RT PCN No comp Stable Clear urine Keep to gravity bag

## 2014-08-29 NOTE — Progress Notes (Signed)
NUTRITION FOLLOW-UP  DOCUMENTATION CODES Per approved criteria  -Non-severe (moderate) malnutrition in the context of chronic illness   INTERVENTION: - Continue Magic cup TID with meals, each supplement provides 290 kcal and 9 grams of protein - Encourage PO intake  NUTRITION DIAGNOSIS: Increased nutrient needs related to wound healing as evidenced by two stage I pressure ulcers, wounds on lower legs. -ongoing  Goal: Pt to meet >/= 90% of their estimated nutrition needs; not met   Monitor:  PO and supplemental intake, GOC, weight, labs, I/O's  ASSESSMENT: 81yo male with extensive PMH including Afib on coumadin, CKD III, chronic sCHF (EF 35%), PAD presented 4/24 to [facility] ED with c/o RLE wound with concern for ischemia. Also found to have acute on CKD, elevated lactate and borderline BP and was tx to [facility] to PCCM with ?sepsis.   Pt with esophageal stricture, but is a poor candidate for intervention at this time per MD note.  Per RN, pt only eating bites and is experiencing early satiety.  Spoke with pt who asked for meal tray. He said that he has been eating magic cups with meal trays. RD placed order for pt lunch tray.  Pt followed by palliative care.  RD will continue to monitor  Labs and medications reviewed  Height: Ht Readings from Last 1 Encounters:  08/29/14 5' 10" (1.778 m)    Weight: Wt Readings from Last 1 Encounters:  08/29/14 220 lb 7.4 oz (100 kg)    Wt Readings from Last 10 Encounters:  08/29/14 220 lb 7.4 oz (100 kg)  08/07/14 192 lb 8 oz (87.317 kg)  08/02/14 189 lb (85.73 kg)  07/19/14 193 lb (87.544 kg)  07/12/14 193 lb (87.544 kg)  07/04/14 189 lb 6 oz (85.9 kg)  07/01/14 193 lb 9.6 oz (87.816 kg)  07/01/14 194 lb (87.998 kg)  06/26/14 193 lb 8 oz (87.771 kg)  06/04/14 190 lb (86.183 kg)    BMI:  Body mass index is 31.63 kg/(m^2).  Estimated Nutritional Needs: Kcal: 1900-2100 Protein: 100-110g Fluid: 1.9L/day  Skin: Two Stage  I pressure ulcers, wounds on lower legs  Diet Order: DIET - DYS 1 Room service appropriate?: Yes; Fluid consistency:: Thin    Intake/Output Summary (Last 24 hours) at 08/29/14 1514 Last data filed at 08/29/14 0924  Gross per 24 hour  Intake    233 ml  Output    251 ml  Net    -18 ml    Last BM: 5/5  Labs:   Recent Labs Lab 08/27/14 0325 08/28/14 0345 08/29/14 0335  NA 131* 134* 135  K 4.5 4.8 4.8  CL 109 107 107  CO2 14* 18* 16*  BUN 120* 126* 132*  CREATININE 3.59* 3.58* 3.73*  CALCIUM 7.8* 8.4* 8.2*  GLUCOSE 87 82 85    CBG (last 3)  No results for input(s): GLUCAP in the last 72 hours.  Scheduled Meds: . amiodarone  400 mg Oral BID  . antiseptic oral rinse  7 mL Mouth Rinse BID  . collagenase   Topical Daily  . latanoprost  1 drop Both Eyes QHS  . sodium bicarbonate  650 mg Oral BID  . sodium chloride  3 mL Intravenous Q12H  . vitamin B-12  1,000 mcg Oral Daily  . warfarin  0.5 mg Oral ONCE-1800  . Warfarin - Pharmacist Dosing Inpatient   Does not apply q1800    Continuous Infusions: . sodium chloride 10 mL/hr at 08/25/14 0932    Past  Medical History  Diagnosis Date  . Hyperlipidemia   . White coat hypertension   . Cardiomyopathy      EF:30-40% in 06/1998, but 50-55% in 2009;  HX ISCHEMIC CARDIOMYOPATHY  . Chronic atrial fibrillation   . Vitamin B12 deficiency   . Chronic anticoagulation   . Arthritis   . Sensory neuropathy   . Chronic kidney disease, stage III (moderate)     creatinine 1.5 2001, 2.0 2009, 2.28 in 12/2009  . Hydronephrosis, bilateral   . Ureteral calculi     BILATERAL  . History of CHF (congestive heart failure)     1999  &  2005  . History of kidney stones   . Bilateral renal cysts   . Aneurysm of infrarenal abdominal aorta 3.9 x 3.6 no change 8/05 no change in 2010;  4.7 in 2013    [facility]-- LAST VISIT OCT 2013--  4.7CM  . Chronic venous insufficiency   . PAD (peripheral artery disease)   . Edema of both  legs   . History of gout     per pt stable  . Glaucoma of both eyes   . Wears hearing aid     bilateral  . CHF (congestive heart failure)   . Shortness of breath dyspnea   . Arrhythmia     Afib CHADS VASC Score of 5.    Past Surgical History  Procedure Laterality Date  . Repair thoracic aorta  01/1995    s/p rupture  . Cataract extraction w/ intraocular lens  implant, bilateral  2012  . 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 Laparoscopic cholecystectomy  JAN 2005  . Percutaneous nephrostolithotomy  1970's  . Abdominal aortagram  07-16-2003  DR ROTHBART    W/ COIL EMBOLIZATION OF SIX BRANCHES OF RIGHT INTERNAL RENAL ARTERY ANEURYSM  . Cardiovascular stress test  04-25-2003    LOW RISK CARDIOLITE STUDY/ MODERATE LV DILATATION AND IMPAIRMENT OF LVSF/  NO ISCHEMIA  . Transthoracic echocardiogram  02-07-2008  DR ROTHBART    MODERATE DILATED LV/  EF 50-55%/ MILD AR/ MILD TO MODERATE AORTIC ROOT DILATATION/ MODERATE MR/ MODERATE LA   &  RA   DIILATED/ MILD TO MODERATE TR  . Cystoscopy w/ ureteral stent placement Bilateral 10/24/2012    Procedure: CYSTOSCOPY WITH RETROGRADE PYELOGRAM/URETERAL STENT PLACEMENT;  Surgeon: Hanley Ben, MD;  Location: [facility];  Service: Urology;  Laterality: Bilateral;  . Cystoscopy with ureteroscopy Left 11/27/2012    Procedure: CYSTOSCOPY,LEFT URETEROSCOPY WITH LASER LITHOTRIPSY/REMOVAL OF MIGRATED STENT, PLACEMENT OF LEFT STENT;  Surgeon: Claybon Jabs, MD;  Location: [facility];  Service: Urology;  Laterality: Left;  . Holmium laser application Left 07/31/960    Procedure: HOLMIUM LASER APPLICATION;  Surgeon: Claybon Jabs, MD;  Location: [facility];  Service: Urology;  Laterality: Left;  . Cystoscopy with ureteroscopy and stent placement N/A 12/29/2012    Procedure: CYSTOSCOPY WITH URETEROSCOPY AND STENT PLACEMENT, removal of right and left stents, retrogrades, right stent exchange;  Surgeon:  Claybon Jabs, MD;  Location: [facility];  Service: Urology;  Laterality: N/A;  . Holmium laser application Right 11/27/6627    Procedure: HOLMIUM LASER APPLICATION;  Surgeon: Claybon Jabs, MD;  Location: [facility];  Service: Urology;  Laterality: Right;  HLL OF (RT) URETERAL PELVIC JUNCTION STONE  . Cystoscopy with retrograde pyelogram, ureteroscopy and stent placement Right 01/29/2013    Procedure: CYSTOSCOPY WITH RIGHT URETEROSCOPY AND STENT PLACEMENT;  Surgeon: Claybon Jabs, MD;  Location: [facility];  Service: Urology;  Laterality: Right;  . Holmium laser application N/A 93/11/1015    Procedure: HOLMIUM LASER APPLICATION;  Surgeon: Claybon Jabs, MD;  Location: [facility];  Service: Urology;  Laterality: N/A;  . Esophagogastroduodenoscopy N/A 02/16/2013    Procedure: ESOPHAGOGASTRODUODENOSCOPY (EGD);  Surgeon: Ladene Artist, MD;  Location: [facility];  Service: Endoscopy;  Laterality: N/A;  . Cystoscopy with retrograde pyelogram, ureteroscopy and stent placement Right 05/07/2013    Procedure: CYSTOSCOPY WITH RIGHT RETROGRADE PYELOGRAM, Balloon dilation of right ureter, Laser incision of right ureter, Nephrostogram;  Surgeon: Claybon Jabs, MD;  Location: [facility];  Service: Urology;  Laterality: Right;  . Holmium laser application Right 09/03/2583    Procedure: HOLMIUM LASER APPLICATION;  Surgeon: Claybon Jabs, MD;  Location: [facility];  Service: Urology;  Laterality: Right;  . Cystoscopy with retrograde pyelogram, ureteroscopy and stent placement Left 07/15/2014    Procedure: LEFT URETEROSCOPY RETROGRADE PYELOGRAM TRANSECTION OF BLADDER TUMOR WITH BLADDER LESION BIOPSIES;  Surgeon: Kathie Rhodes, MD;  Location: [facility];  Service: Urology;  Laterality: Left;     Laurette Schimke Jermyn, [place], [place]

## 2014-08-29 NOTE — Progress Notes (Signed)
ANTICOAGULATION CONSULT NOTE - Follow up  Pharmacy Consult for warfarin Indication: atrial fibrillation  Allergies  Allergen Reactions  . Sulfa Antibiotics Rash    Nausea    Patient Measurements: Height:  (was not able get pt. weight bed not operating) Weight: 213 lb 3 oz (96.7 kg) IBW/kg (Calculated) : 73  Vital Signs: Temp: 97.5 F (36.4 C) (05/05 1200) Temp Source: Oral (05/05 1200) BP: 83/47 mmHg (05/05 1400) Pulse Rate: 121 (05/05 1400)  Labs:  Recent Labs  08/27/14 0325 08/27/14 0328 08/28/14 0345 08/29/14 0335  HGB 9.3*  --  9.2* 9.2*  HCT 28.1*  --  27.6* 27.5*  PLT 124*  --  144* 146*  LABPROT  --  22.8* 25.2* 27.6*  INR  --  2.00* 2.26* 2.55*  CREATININE 3.59*  --  3.58* 3.73*   Estimated Creatinine Clearance: 16.6 mL/min (by C-G formula based on Cr of 3.73).  Medications:  Scheduled:  . amiodarone  400 mg Oral BID  . antiseptic oral rinse  7 mL Mouth Rinse BID  . collagenase   Topical Daily  . latanoprost  1 drop Both Eyes QHS  . sodium bicarbonate  650 mg Oral BID  . sodium chloride  3 mL Intravenous Q12H  . vitamin B-12  1,000 mcg Oral Daily  . warfarin  0.5 mg Oral ONCE-1800  . Warfarin - Pharmacist Dosing Inpatient   Does not apply q1800   Assessment: 81yo M presented to [facility] w/ leg swelling, suspected cellulitis, elev SCr, and supratherapeutic INR. Pharmacy was consulted to dose warfarin. Recent course of Vantin could have contributed to supratherapeutic INR. Denies significant changes in dietary intake. I confirmed his most recent warfarin regimen with him: 2.5mg  daily except nothing on Wednesdays.  Significant events: 4/25: INR improved to 3.12 after vit.K 10mg  and FFP.  4/26: INR dropped to 2. Gave warf 2.5mg . Started amiodarone. 4/27: INR took large jump, now slightly above therapeutic range 4/28: INR increased further, Doxycycline added- d/c 4/29, Amiodarone started, Diltiazem started/stopped 5/1: INR remains elevated, Amiodarone stopped  for low BP, Hematuria noted 5/2: INR now within therapeutic range (2.55) s/p vitamin K last PM, low-dose warfarin resumed  Today, 08/29/2014:  INR remains therapeutic and rising. Appears patient did not receive dose on 5/3.    Diet: Cardiac, thickened liquids  H/H and platelets low, but stable. No bleeding reported/documented.  Amiodarone will increase sensitivity to warfarin as it reaches steady-state over the next couple of weeks.   Goal of Therapy:  INR 2-3   Plan:   Warfarin 0.5 mg PO today at 18:00  Check PT/INR daily.  Monitor for s/s of bleeding.   Greer Pickerel, PharmD, BCPS Pager: [phone] 08/29/2014 2:41 PM

## 2014-08-29 NOTE — Consult Note (Signed)
Consultation Note Date: 08/29/2014   Patient Name: [name]  DOB: Jun 17, 1925  MRN: [id number]  Age / Sex: 81 y.o., male   PCP: Merlyn Albert, MD Referring Physician: Leatha Gilding, MD  Reason for Consultation: Establishing goals of care  Palliative Care Assessment and Plan Summary of Established Goals of Care and Medical Treatment Preferences   [name] is a fiercely independent gentleman with advanced heart failure, chronic kidney disease, massive anasarca, Afib, hypotension, sepsis from Pseudomonal UTI, aspiration and very poor chance of making a meaningful recovery. He has been in the hospital for 11 days and has not made significant improvements- he remains hemodynamically fragile- he now nephrostomy tubes- hypotension and overall functional status decline.   He is really languishing but hasn't been willing to talk about QOL or the reality of the situation. He has very poor insight into his condition-he becomes extremely angry when you discuss his serious condition- this is because he "can't afford someone to take care of him at home" and he also said "I had rather die than go into a nursing home". When I told him that I was worried he was sick enough that he may not survive this hospitalization he told me that he didn't want to hear that but if it happened he would be ok with that.   [name] believes that he came in the hospital to "get the fluid off my legs" but "I have been treated for everything but that"-- he has made no connection between his heart failure, kidney failure and the fluid he is retaining. He could not tell me anything specific about his heart, kidneys or infection issues.   Family extremely realistic-they want peace and comfort for [name]-they know he is going to fight for his independence but sadly there is little if any chance of him recovering back to a fully functional person living at home alone which I am surprised he was even doing prior to this admission. (per family  it was not going well at all months prior to admission and that he has looked awful for months-year).  Palliative Care Discussion Held Today Contacts/Participants in Discussion: Primary Decision Maker: Katie Reddick  HCPOA: yes  SIL, and another grand-daughter present for meeting  Code Status/Advance Care Planning:  DNR  Symptom Management:   Pain: IV Fentanyl PRN for pain  Dyspnea: PRN O2, Fentanyl or Hydromorphone preferred in renal failure  Palliative Prophylaxis: Bowel regimen  Psycho-social/Spiritual:   Support System: Family living and supportive but they are also frustrated with [name]- he has been clear that a life worth living is not in a nursing home.  Desire for further Chaplaincy support:yes  Other Recommendations:   Recommend transitioning to more of a comfort oriented approach because it seems that most if not all reasonable medical options have been exhausted-we have given him every best chance for recovery but he remains in poor condition. Gradually begin to wean back and transition his IV medications and escalate interventions for dyspnea and discomfort.  No advantage to wrapping his legs- this is just causing pain  Chaplain Consult-existential suffering evident  Prognosis: < 2 weeks  Discharge Planning:  [name] may die in the hospital- his prognosis is extremely poor - I suspect he will declare him self actively dying or stable severely debilitated in teh nexty 24-48 hours.       Chief Complaint/History of Present Illness: Admitted with anasarca  Primary Diagnoses  Present on Admission:  . Sepsis . Malnutrition of moderate degree .  Acute on chronic renal failure . Acute respiratory failure . A-fib . Acute on chronic systolic heart failure . Chronic kidney disease, stage III (moderate) . Cellulitis of leg, right . UTI (lower urinary tract infection) . Essential hypertension  Palliative Review of Systems: Complains of pain and discomfort "all  over". +BM- other wise denies symptoms I have reviewed the medical record, interviewed the patient and family, and examined the patient. The following aspects are pertinent.  Past Medical History  Diagnosis Date  . Hyperlipidemia   . White coat hypertension   . Cardiomyopathy      EF:30-40% in 06/1998, but 50-55% in 2009;  HX ISCHEMIC CARDIOMYOPATHY  . Chronic atrial fibrillation   . Vitamin B12 deficiency   . Chronic anticoagulation   . Arthritis   . Sensory neuropathy   . Chronic kidney disease, stage III (moderate)     creatinine 1.5 2001, 2.0 2009, 2.28 in 12/2009  . Hydronephrosis, bilateral   . Ureteral calculi     BILATERAL  . History of CHF (congestive heart failure)     1999  &  2005  . History of kidney stones   . Bilateral renal cysts   . 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 Chronic venous insufficiency   . PAD (peripheral artery disease)   . Edema of both legs   . History of gout     per pt stable  . Glaucoma of both eyes   . Wears hearing aid     bilateral  . CHF (congestive heart failure)   . Shortness of breath dyspnea   . Arrhythmia     Afib CHADS VASC Score of 5.   History   Social History  . Marital Status: Widowed    Spouse Name: N/A  . Number of Children: N/A  . Years of Education: N/A   Occupational History  . Retired    Social History Main Topics  . Smoking status: Never Smoker   . Smokeless tobacco: Never Used  . Alcohol Use: No  . Drug Use: No  . Sexual Activity: No   Other Topics Concern  . None   Social History Narrative   Family History  Problem Relation Age of Onset  . Heart disease Mother   . Early death Father   . Aneurysm Brother   . Aortic aneurysm Brother   . Diabetes Sister    Scheduled Meds: . amiodarone  400 mg Oral BID  . antiseptic oral rinse  7 mL Mouth Rinse BID  . collagenase   Topical Daily    . latanoprost  1 drop Both Eyes QHS  . sodium bicarbonate  650 mg Oral BID  . sodium chloride  3 mL Intravenous Q12H  . vitamin B-12  1,000 mcg Oral Daily  . Warfarin - Pharmacist Dosing Inpatient   Does not apply q1800   Continuous Infusions: . sodium chloride 10 mL/hr at 08/25/14 0932   PRN Meds:.diphenhydrAMINE, HYDROcodone-acetaminophen, RESOURCE THICKENUP CLEAR, senna-docusate Medications Prior to Admission:  Prior to Admission medications   Medication Sig Start Date End Date Taking? Authorizing Provider  furosemide (LASIX) 40 MG tablet Take 1.5 tablets (60 mg total) by mouth daily. Patient taking differently: Take 60 mg by mouth 2 (two) times daily.  08/05/14  Yes Jodelle Gross, NP  HYDROcodone-acetaminophen (NORCO/VICODIN) 5-325 MG per tablet Take one  half to one tablet every 4 hours prn severe pain. Caution drowsiness. 08/08/14  Yes Babs Sciara, MD  simvastatin (ZOCOR) 20 MG tablet Take 20 mg by mouth daily.   Yes Historical Provider, MD  traZODone (DESYREL) 50 MG tablet Take 0.5 tablets (25 mg total) by mouth at bedtime. 07/25/14  Yes Merlyn Albert, MD  vitamin B-12 (CYANOCOBALAMIN) 1000 MCG tablet Take 1,000 mcg by mouth daily.   Yes Historical Provider, MD  warfarin (COUMADIN) 5 MG tablet Take 0-2.5 mg by mouth See admin instructions. Takes half of a  tablet daily except nothing on Wednesdays.   Yes Historical Provider, MD  acetaminophen (TYLENOL) 500 MG tablet Take 1,000 mg by mouth every 6 (six) hours as needed for mild pain, moderate pain or headache.     Historical Provider, MD  colchicine (COLCRYS) 0.6 MG tablet TAKE ONE TABLET BY MOUTH 2 TIMES A DAY FOR GOUT. Patient taking differently: Take 0.6 mg by mouth daily as needed (Gout).  12/24/13   Merlyn Albert, MD  latanoprost (XALATAN) 0.005 % ophthalmic solution Place 1 drop into both eyes at bedtime.  08/12/12   Historical Provider, MD  metolazone (ZAROXOLYN) 5 MG tablet Take 1 tablet 5 mg for 2 days and then  STOP Patient not taking: Reported on 08/03/2014 08/05/14   Jodelle Gross, NP  metoprolol (LOPRESSOR) 100 MG tablet Take 1 tablet (100 mg total) by mouth 2 (two) times daily. 07/19/14   Jodelle Gross, NP   Allergies  Allergen Reactions  . Sulfa Antibiotics Rash    Nausea    CBC:    Component Value Date/Time   WBC 9.2 08/29/2014 0335   HGB 9.2* 08/29/2014 0335   HCT 27.5* 08/29/2014 0335   PLT 146* 08/29/2014 0335   MCV 91.1 08/29/2014 0335   NEUTROABS 8.5* 08/28/2014 0345   LYMPHSABS 0.5* 08/28/2014 0345   MONOABS 0.4 08/28/2014 0345   EOSABS 0.1 08/28/2014 0345   BASOSABS 0.0 08/28/2014 0345   Comprehensive Metabolic Panel:    Component Value Date/Time   NA 135 08/29/2014 0335   K 4.8 08/29/2014 0335   CL 107 08/29/2014 0335   CO2 16* 08/29/2014 0335   BUN 132* 08/29/2014 0335   CREATININE 3.73* 08/29/2014 0335   CREATININE 4.41* 08/14/2014 0706   GLUCOSE 85 08/29/2014 0335   CALCIUM 8.2* 08/29/2014 0335   AST 18 08/29/2014 0335   ALT 14* 08/29/2014 0335   ALKPHOS 85 08/29/2014 0335   BILITOT 0.8 08/29/2014 0335   PROT 5.8* 08/29/2014 0335   ALBUMIN 2.2* 08/29/2014 0335    Physical Exam: Vital Signs: BP 81/58 mmHg  Pulse 88  Temp(Src) 97.9 F (36.6 C) (Oral)  Resp 17  Ht  (1.778 m)  Wt 96.7 kg (213 lb 3 oz)  BMI 30.59 kg/m2  SpO2 96% SpO2: SpO2: 96 % O2 Device: O2 Device: Nasal Cannula O2 Flow Rate: O2 Flow Rate (L/min): 2 L/min Intake/output summary:  Intake/Output Summary (Last 24 hours) at 08/29/14 0808 Last data filed at 08/29/14 0600  Gross per 24 hour  Intake    220 ml  Output    250 ml  Net    -30 ml   LBM:   Baseline Weight: Weight: 86.4 kg (190 lb 7.6 oz) Most recent weight: Weight: 96.7 kg (213 lb 3 oz)  Exam Findings:           Palliative Performance Scale: *30%  Additional Data Reviewed: Recent Labs     08/28/14  0345  08/29/14  0335  WBC  9.5  9.2  HGB  9.2*  9.2*  PLT  144*  146*  NA  134*  135   BUN  126*  132*  CREATININE  3.58*  3.73*     Time In: 7:15 Time Out: 8:30 Time Total: 75 min  Greater than 50%  of this time was spent counseling and coordinating care related to the above assessment and plan.  Signed by: Hilbert Odor, DO  08/29/2014, 8:08 AM  Please contact Palliative Medicine Team phone at [phone] for questions and concerns.

## 2014-08-29 NOTE — Progress Notes (Signed)
Patient is stable at transfer. Report given to nurse.  

## 2014-08-29 NOTE — Progress Notes (Signed)
PROGRESS NOTE  [name] SNV:[id number] DOB: 29-Aug-1925 DOA: 08/01/2014 PCP: Lubertha South, MD  HPI: Patient admitted 4/25 on PCCM service with severe sepsis requiring pressors, acute on chronic CHF, A fib with RVR, coagulopathy with INR > 10, cellulitis. He was seen by Cardiology as well and found to have Chr Systolic HF with new-onset Afib this admission-? 2/2 to Tachyarrythmia related CM. He has been diuresed per Cardiology but has a poor long-term prognosis given inability to use rate controlling agents as well as massive anasarca limiting diuretic use to control edema. His Perc Neprhostomy seeemd to be more bloody overnight 4/30 and patient experienced hypotension requiring an IV saline bolus, Amio gtt was turned off. He has been persistingly hypotensiv.   Subjective / 24 H Interval events - appears upset - denies chest pain or breathing difficulties - complains of LE swelling  Assessment/Plan: Principal Problem:   Acute respiratory failure Active Problems:   Chronic kidney disease, stage III (moderate)   Sepsis   Acute on chronic renal failure   Essential hypertension   Malnutrition of moderate degree   A-fib   Acute on chronic systolic heart failure   Cellulitis of leg, right   UTI (lower urinary tract infection)   Arterial hypotension   Cardiogenic shock   ADHF (acute decompensated heart failure)   Nephrostomy complication   Pain   Pleural effusion   Aspiration into airway   Attention to urostomy   Goals of care, counseling/discussion   Goals of care - long discussion again today with [name] and his sister. He just met palliative team this morning, very upset when his prognosis is mentioned. Appreciate Dr. Lamar Blinks help. Patient very irritable and has a personal impression that we have not treated his leg swelling. Poor insight into A fib with RVR, acute systolic heart failure, hypotension and renal failure. Transfer to telemetry today.  Chronic right  hydronephrosis secondary to ureteral obstruction  - managed with nephrostomy tube, chronic for at least 1 year - d/w over the phone with Dr. [name] 5/4 - nephrostomy tube migrated, exchanged by IR 5/4  A. fib with RVR - Likely secondary to sepsis.  - Patient had a 2-D echo done early on in this hospitalization which showed a EF of 30 to 35% with no wall motion abnormalities.  - cardiology following, currently on Amiodarone, heart rate still elevated  Acute hypoxic respiratory failure  - secondary to A fib with RVR and acute on chronic systolic CHF.  - significantly volume overloaded/anasarca - Cannot be diuresed due to hypotension  Acute on chronic systolic heart failure - in the setting of A fib with RVR, sepsis on admission  Sepsis - on admission, requiring pressors, was on PCCM service - Secondary to Pseudomonas UTI, completed Cefepime on 5/2  Coagulopathy  - ? In the setting of poor liver perfusion  Esophageal stricture - Eventually might benefit from Esophageal dilatation if stabilizes - clinically not a candidate currently for any intervention  Acute on chronic renal failure with non anion gap metabolic acidosis - Likely secondary to cardiorenal syndrome.  - Patient with a poor ejection fraction and currently in acute heart failure with A. fib with RVR.  - worsening  Anemia - Likely anemia of chronic disease. No overt bleeding. Follow H&H.  Thrombocytopenia - Likely secondary to acute infection - stable   Right lower extremity wound - Wound care consult appreciated - Rec's -4 cm x 2 cm wound bed is 100% eschar, separated from wound  margins, boggy and fluctuant Cleanse right lateral lower leg with NS and pat gently dry. Apply Santyl ointment, 1/8 inch thickness (opaque). Top with NS moist dressing. Cover with 4x4 gauze and secure with kerlix and tape. Change daily.   Constipation - Senokot-S.    Diet: DIET - DYS 1 Room service appropriate?: Yes; Fluid  consistency:: Thin Fluids: none DVT Prophylaxis: Warfarin  Code Status: DNR Family Communication: d/w granddaughter [name] over the phone cell [phone]  Disposition Plan: remain inpatient  Consultants:  Cardiology   Urology   Procedures:  2D echo   Antibiotics  IV cefepime 08/21/2014   IV Zosyn 08/17/2014>>>> 08/21/2014  IV vancomycin 07/29/2014>>>> 08/21/2014  Oral doxycycline 08/22/2014>>>08/23/14   Studies   2-D echo 08/20/2014  Chest x-ray 08/12/2014, 08/19/2014, 08/20/2014, 08/21/2014  X-ray of the hip 08/19/2014  X-ray of the tib-fib 08/19/2014 neg for acute fx, mild knee/ankle joint osteo, extensive peripheral vascular calcification, focal soft tissue swelling  Objective  Filed Vitals:   08/29/14 0700 08/29/14 0800 08/29/14 0900 08/29/14 1200  BP: $Re'81/58 78/53 68/48 'jMW$   Pulse: 88 122 118   Temp:  97.5 F (36.4 C)  97.5 F (36.4 C)  TempSrc:  Oral  Oral  Resp: $Remo'17 15 20   'duvtr$ Height:      Weight:      SpO2: 96% 93% 94%     Intake/Output Summary (Last 24 hours) at 08/29/14 1247 Last data filed at 08/29/14 0924  Gross per 24 hour  Intake    233 ml  Output    251 ml  Net    -18 ml   Filed Weights   08/27/14 0600 08/28/14 0600 08/29/14 0500  Weight: 95.5 kg (210 lb 8.6 oz) 95.1 kg (209 lb 10.5 oz) 96.7 kg (213 lb 3 oz)   Exam:  General:  Ill appearing  HEENT: no scleral icterus, PERRL  Cardiovascular: irregular, tachycardic, +JVD, 2+ pitting LE edema  Respiratory: shallow breathing, no wheezing, basilar crackles  Abdomen: soft, non tender, BS +, no guarding  MSK/Extremities: no clubbing/cyanosis, no joint swelling  Skin: no rashes  Neuro: non focal   Data Reviewed: Basic Metabolic Panel:  Recent Labs Lab 08/25/14 0933 08/26/14 0350 08/27/14 0325 08/28/14 0345 08/29/14 0335  NA 134* 131* 131* 134* 135  K 4.5 4.6 4.5 4.8 4.8  CL 109 107 109 107 107  CO2 18* 16* 14* 18* 16*  GLUCOSE 87 103* 87 82 85  BUN 118* 121* 120*  126* 132*  CREATININE 3.55* 3.52* 3.59* 3.58* 3.73*  CALCIUM 7.9* 7.9* 7.8* 8.4* 8.2*   Liver Function Tests:  Recent Labs Lab 08/25/14 0933 08/26/14 0350 08/27/14 0325 08/28/14 0345 08/29/14 0335  AST 13* 17 14* 19 18  ALT 12* 13* 12* 14* 14*  ALKPHOS 68 80 74 84 85  BILITOT 0.7 0.7 0.8 0.9 0.8  PROT 6.1* 5.8* 5.8* 5.9* 5.8*  ALBUMIN 2.3* 2.3* 2.3* 2.2* 2.2*   CBC:  Recent Labs Lab 08/24/14 0350 08/25/14 0933 08/26/14 0350 08/27/14 0325 08/28/14 0345 08/29/14 0335  WBC 8.2 8.4 8.8 8.4 9.5 9.2  NEUTROABS 7.0 7.5 7.6 7.4 8.5*  --   HGB 9.8* 9.5* 9.1* 9.3* 9.2* 9.2*  HCT 29.9* 29.5* 28.3* 28.1* 27.6* 27.5*  MCV 94.0 94.2 94.0 92.7 92.0 91.1  PLT 107* 120* 124* 124* 144* 146*   Cardiac Enzymes:  Recent Labs Lab 08/22/14 1724 08/22/14 2320  TROPONINI 0.03 0.03   BNP (last 3 results)  Recent Labs  08/21/14 0350 08/22/14 1230  08/23/14 0522  BNP 430.2* 647.7* 1201.9*    ProBNP (last 3 results)  Recent Labs  04/09/14 1038  PROBNP 9656.0*    No results found for this or any previous visit (from the past 240 hour(s)).   Scheduled Meds: . amiodarone  400 mg Oral BID  . antiseptic oral rinse  7 mL Mouth Rinse BID  . collagenase   Topical Daily  . latanoprost  1 drop Both Eyes QHS  . sodium bicarbonate  650 mg Oral BID  . sodium chloride  3 mL Intravenous Q12H  . vitamin B-12  1,000 mcg Oral Daily  . Warfarin - Pharmacist Dosing Inpatient   Does not apply q1800   Continuous Infusions: . sodium chloride 10 mL/hr at 08/25/14 0932   Time spent: 35 minutes, more than 50% bedside for counseling / discussions / goals of care  Marzetta Board, MD [facility] Pager [phone]. If 7 PM - 7 AM, please contact night-coverage at [email or web address], password [facility] 08/29/2014, 12:47 PM  LOS: 11 days

## 2014-08-30 DIAGNOSIS — Z7189 Other specified counseling: Secondary | ICD-10-CM

## 2014-08-30 LAB — BASIC METABOLIC PANEL
Anion gap: 12 (ref 5–15)
BUN: 132 mg/dL — ABNORMAL HIGH (ref 6–20)
CO2: 17 mmol/L — ABNORMAL LOW (ref 22–32)
Calcium: 8.4 mg/dL — ABNORMAL LOW (ref 8.9–10.3)
Chloride: 108 mmol/L (ref 101–111)
Creatinine, Ser: 3.94 mg/dL — ABNORMAL HIGH (ref 0.61–1.24)
GFR, EST AFRICAN AMERICAN: 15 mL/min — AB (ref >60)
GFR, EST NON AFRICAN AMERICAN: 13 mL/min — AB (ref >60)
GLUCOSE: 91 mg/dL (ref 70–99)
POTASSIUM: 5.2 mmol/L — AB (ref 3.5–5.1)
SODIUM: 137 mmol/L (ref 135–145)

## 2014-08-30 LAB — PROTIME-INR
INR: 2.48 — ABNORMAL HIGH (ref 0.00–1.49)
Prothrombin Time: 27 seconds — ABNORMAL HIGH (ref 11.6–15.2)

## 2014-08-30 MED ORDER — WARFARIN SODIUM 1 MG PO TABS
1.0000 mg | ORAL_TABLET | Freq: Once | ORAL | Status: DC
  Filled 2014-08-30: qty 1

## 2014-08-30 MED ORDER — SENNOSIDES-DOCUSATE SODIUM 8.6-50 MG PO TABS
1.0000 | ORAL_TABLET | Freq: Every day | ORAL | Status: DC
  Administered 2014-08-30 – 2014-08-31 (×2): 1 via ORAL
  Filled 2014-08-30 (×2): qty 1

## 2014-08-30 MED ORDER — ACETAMINOPHEN 325 MG PO TABS
650.0000 mg | ORAL_TABLET | Freq: Three times a day (TID) | ORAL | Status: DC
  Administered 2014-08-30 – 2014-08-31 (×3): 650 mg via ORAL
  Filled 2014-08-30 (×4): qty 2

## 2014-08-30 MED ORDER — FENTANYL CITRATE (PF) 100 MCG/2ML IJ SOLN
12.5000 ug | INTRAMUSCULAR | Status: DC | PRN
  Administered 2014-08-30 – 2014-08-31 (×2): 12.5 ug via INTRAVENOUS
  Administered 2014-08-31 – 2014-09-01 (×3): 25 ug via INTRAVENOUS
  Administered 2014-09-01: 23 ug via INTRAVENOUS
  Administered 2014-09-01: 25 ug via INTRAVENOUS
  Filled 2014-08-30 (×7): qty 2

## 2014-08-30 MED ORDER — DIAZEPAM 5 MG/ML IJ SOLN
2.5000 mg | INTRAMUSCULAR | Status: DC | PRN
Start: 2014-08-30 — End: 2014-09-01
  Administered 2014-08-31 – 2014-09-01 (×5): 2.5 mg via INTRAVENOUS
  Filled 2014-08-30 (×5): qty 2

## 2014-08-30 MED ORDER — OXYCODONE HCL 5 MG PO TABS
5.0000 mg | ORAL_TABLET | ORAL | Status: DC | PRN
  Administered 2014-08-30 – 2014-08-31 (×3): 5 mg via ORAL
  Filled 2014-08-30 (×3): qty 1

## 2014-08-30 MED ORDER — GERHARDT'S BUTT CREAM
TOPICAL_CREAM | Freq: Three times a day (TID) | CUTANEOUS | Status: DC
  Administered 2014-08-30: 1 via TOPICAL
  Administered 2014-08-31 (×3): via TOPICAL
  Filled 2014-08-30: qty 1

## 2014-08-30 MED ORDER — BISACODYL 10 MG RE SUPP
10.0000 mg | Freq: Every day | RECTAL | Status: DC | PRN
  Administered 2014-08-30: 10 mg via RECTAL
  Filled 2014-08-30: qty 1

## 2014-08-30 NOTE — Progress Notes (Signed)
ANTICOAGULATION CONSULT NOTE - Follow up  Pharmacy Consult for warfarin Indication: atrial fibrillation  Allergies  Allergen Reactions  . Sulfa Antibiotics Rash    Nausea    Patient Measurements: Height: 5\' 10"  (177.8 cm) Weight: 209 lb 3.5 oz (94.9 kg) IBW/kg (Calculated) : 73  Vital Signs: Temp: 97.8 F (36.6 C) (05/06 0446) Temp Source: Oral (05/06 0446) BP: 86/57 mmHg (05/06 0454) Pulse Rate: 91 (05/06 0446)  Labs:  Recent Labs  08/28/14 0345 08/29/14 0335 08/30/14 0550  HGB 9.2* 9.2*  --   HCT 27.6* 27.5*  --   PLT 144* 146*  --   LABPROT 25.2* 27.6* 27.0*  INR 2.26* 2.55* 2.48*  CREATININE 3.58* 3.73* 3.94*   Estimated Creatinine Clearance: 15.6 mL/min (by C-G formula based on Cr of 3.94).  Medications:  Scheduled:  . amiodarone  400 mg Oral BID  . antiseptic oral rinse  7 mL Mouth Rinse BID  . collagenase   Topical Daily  . latanoprost  1 drop Both Eyes QHS  . sodium bicarbonate  650 mg Oral BID  . sodium chloride  3 mL Intravenous Q12H  . vitamin B-12  1,000 mcg Oral Daily  . Warfarin - Pharmacist Dosing Inpatient   Does not apply q1800   Assessment: 81yo M presented to [facility] w/ leg swelling, suspected cellulitis, elev SCr, and supratherapeutic INR. Pharmacy was consulted to dose warfarin. Recent course of Vantin could have contributed to supratherapeutic INR. Denies significant changes in dietary intake. I confirmed his most recent warfarin regimen with him: 2.5mg  daily except nothing on Wednesdays.  Significant events: 4/25: INR improved to 3.12 after vit.K 10mg  and FFP.  4/26: INR dropped to 2. Gave warf 2.5mg . Started amiodarone. 4/27: INR took large jump, now slightly above therapeutic range 4/28: INR increased further, Doxycycline added- d/c 4/29, Amiodarone started, Diltiazem started/stopped 5/1: INR remains elevated, Amiodarone stopped for low BP, Hematuria noted 5/2: INR now within therapeutic range (2.55) s/p vitamin K last PM, low-dose  warfarin resumed  Today, 08/30/2014:  INR therapeutic. Appears patient did not receive dose on 5/3.    Diet: Cardiac, thickened liquids  H/H and platelets low, but stable. No bleeding reported/documented.  Amiodarone will increase sensitivity to warfarin as it reaches steady-state over the next couple of weeks.   Goal of Therapy:  INR 2-3   Plan:   Warfarin 1 mg PO today at 18:00  May need lower dose of warfarin at discharge than previous home dose following start of amiodarone as well as other acute reasons for needing dose reduction   Currently would recommend new Rx for 2.5mg  tabs, taking 1/2 tab (1.25mg ) daily  Check PT/INR daily.  Monitor for s/s of bleeding.   Juliette Alcide, PharmD, BCPS.   Pager: [phone] 08/30/2014 10:03 AM

## 2014-08-30 NOTE — Progress Notes (Signed)
Dressing changed on Right lateral lower leg wound, cleanse with NS, santyl ointment applied 1/8 thickness, covered with moist dressing, cover with 4x4 gauge, wrapped with kerlix, per  Per wound care.

## 2014-08-30 NOTE — Progress Notes (Signed)
Daily Progress Note   Patient Name: [name]       Date: 08/30/2014 DOB: 1925/05/31  Age: 81 y.o. MRN#: [id number] Attending Physician: Leatha Gilding, MD Primary Care Physician: Lubertha South, MD Admit Date: Aug 27, 2014  Reason for Consultation/Follow-up: Disposition and Establishing goals of care  Subjective: [name] continues to express his misery related to being in the hospital. He "had a rough morning". "Wsihes people would stop talking to him about bad things". He has irritation of his "rectum" - "feels like he is sitting on a rock. +Generalized discomfort. Minimal PO intake-complains about food choices. ++irritable.  Interval Events: Palliative consult 5/6  Length of Stay: 12 days  Current Medications: Scheduled Meds:  . amiodarone  400 mg Oral BID  . antiseptic oral rinse  7 mL Mouth Rinse BID  . collagenase   Topical Daily  . latanoprost  1 drop Both Eyes QHS  . sodium bicarbonate  650 mg Oral BID  . sodium chloride  3 mL Intravenous Q12H  . vitamin B-12  1,000 mcg Oral Daily  . warfarin  1 mg Oral ONCE-1800  . Warfarin - Pharmacist Dosing Inpatient   Does not apply q1800    Continuous Infusions: . sodium chloride 10 mL/hr at 08/25/14 0932    PRN Meds: diphenhydrAMINE, fentaNYL (SUBLIMAZE) injection, HYDROcodone-acetaminophen, RESOURCE THICKENUP CLEAR, senna-docusate  Palliative Performance Scale: 30%     Vital Signs: BP 86/57 mmHg  Pulse 91  Temp(Src) 97.8 F (36.6 C) (Oral)  Resp 20  Ht 5\' 10"  (1.778 m)  Wt 94.9 kg (209 lb 3.5 oz)  BMI 30.02 kg/m2  SpO2 99% SpO2: SpO2: 99 % O2 Device: O2 Device: Nasal Cannula O2 Flow Rate: O2 Flow Rate (L/min): 2 L/min  Intake/output summary:  Intake/Output Summary (Last 24 hours) at 08/30/14 1317 Last data filed at 08/30/14 0839  Gross per 24 hour  Intake      0 ml  Output    600 ml  Net   -600 ml   LBM:   Baseline Weight: Weight: 86.4 kg (190 lb 7.6 oz) Most recent weight: Weight: 94.9 kg (209 lb 3.5  oz)  Physical Exam: Lethargic. AOX3. Generally cooperative.  Additional Data Reviewed: Recent Labs     08/28/14  0345  08/29/14  0335  08/30/14  0550  WBC  9.5  9.2   --   HGB  9.2*  9.2*   --   PLT  144*  146*   --   NA  134*  135  137  BUN  126*  132*  132*  CREATININE  3.58*  3.73*  3.94*     Problem List:  Patient Active Problem List   Diagnosis Date Noted  . Atrial fibrillation with RVR   . Goals of care, counseling/discussion   . Aspiration into airway   . Attention to urostomy   . Nephrostomy complication   . Pain   . Pleural effusion   . Acute respiratory failure 08/22/2014  . A-fib 08/22/2014  . Acute on chronic systolic heart failure 08/22/2014  . Cellulitis of leg, right 08/22/2014  . UTI (lower urinary tract infection) 08/22/2014  . Arterial hypotension   . Cardiogenic shock   . ADHF (acute decompensated heart failure)   . Malnutrition of moderate degree 08/20/2014  . Depression 08/11/2014  . Chronic systolic heart failure   . Essential hypertension   . Warfarin anticoagulation 07/13/2014  . Systolic CHF, chronic 07/12/2014  . HTN (hypertension) 07/12/2014  .  Kidney stones 07/12/2014  . Hydronephrosis 07/12/2014  . AKI (acute kidney injury) 07/12/2014  . Hyperkalemia   . Renal failure   . CHF (congestive heart failure) 07/03/2014  . Acute on chronic renal failure 07/03/2014  . Generalized weakness 07/03/2014  . Thrombocytopenia 07/03/2014  . Dysphagia, pharyngoesophageal phase 07/01/2014  . Congestive heart failure 04/14/2014  . Insomnia 04/14/2014  . Encounter for therapeutic drug monitoring 06/04/2013  . Esophageal candidiasis 02/16/2013  . Nonspecific abnormal finding in stool contents 02/13/2013  . Acute renal failure 02/12/2013  . Hypotension 02/12/2013  . Melena 02/12/2013  . Anemia 02/12/2013  . Urinary tract infection, site not specified 02/12/2013  . Sepsis 02/12/2013  . Thoracic aortic aneurysm 02/03/2011  . Aneurysm of  peripheral artery 02/03/2011  . Chronic kidney disease, stage III (moderate)   . AAA (abdominal aortic aneurysm)   . White coat hypertension   . Nephrolithiasis   . Chronic anticoagulation 07/23/2010  . VITAMIN B12 DEFICIENCY 02/21/2009  . HYPERLIPIDEMIA 02/18/2009  . Gout 02/18/2009  . Atrial fibrillation, chronic 02/18/2009     Palliative Care Assessment & Plan    Code Status:  DNR  Goals of Care:   His MAIN goal is to go home. No exceptions.  He wants to walk- he tells me in his mind he can do anything-we talked about how upsetting the disconnect can be-he got made when I told him he would not even be able to drive.  He thinks we are "doping him up"- I explained how kidney failure and high BUN acidosis among all of his other issues could be causing his fatigue  He want to take "less pills" "--they are not doing anything for him anyway"- I agreed to discontinue non-essential meds and leave them PRN per his wishes- he only wants to continue his "heart pills".  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 Will allow him a regular diet for his comfort. He tells me he knows what he can and cant eat.  Discontinued his telemetry per his request and based on goals of care  Adjusted his pain medications-scheduled Tylenol, stopped hydrocodone and will use OxyIR with IV Fentanyl for severe discomfort-, added on IV Valium for agitation and anxiety  Dulcolax suppository  MOST Form completed and on the chart  5. Prognosis: < 2 weeks  5. Discharge Planning: He is extremely fragile- he may have a hospital death- but I have encouraged family to find 24/7 in home caregivers- he adamantly refuses Hospice. He refuses Rehab or SNF.   Care plan was discussed with Roseanne Reno  [facility])  Thank you for allowing the Palliative Medicine Team to assist in the care of this patient.  Time: 12:30-1:40  Total Time: 70 minutes at bedside.  Greater than 50%  of this time was spent counseling and coordinating care related to the above assessment and plan.   Edsel Petrin, DO  08/30/2014, 1:17 PM  Please contact Palliative Medicine Team phone at [phone] for questions and concerns.

## 2014-08-30 NOTE — Progress Notes (Signed)
Spoke with pt concerning discharge to a SNF, pt states, "Yes if it is [facility]."   Pt's nephew, Junius Creamer ([phone], was at bedside when pt was asked and agreed to [facility]. Referral given to CSW.

## 2014-08-30 NOTE — Progress Notes (Signed)
Chaplain visited with patient and family together. Patient was quiet and did not talk very much. Patient's grand daughter did much of the communicating. She stated that" there were no concerns that needed to be addressed at this time". They were assured of the availa   08/30/14 1200  Clinical Encounter Type  Visited With Patient and family together  Visit Type Initial;Spiritual support;Social support  Referral From Nurse  bility of the [place].

## 2014-08-30 NOTE — Progress Notes (Signed)
Physical Therapy Treatment Patient Details Name: [name] MRN: [id number] DOB: 05/21/1925 Today's Date: 08/30/2014    History of Present Illness 81 yo male admitted with sepsis. hx of recent fall with R ankle sprain, afib, chf, pad. Pt with poor prognosis.    PT Comments    Pt pivoted to [facility] then to recliner with mod assist. Pt pleasantly surprised that he was able to tolerate standing without foot pain. He expressed hope that he would be able to walk and go home. Noted in chart that pt has poor prognosis and may not survive this hospitalization.   Follow Up Recommendations  SNF     Equipment Recommendations  Rolling walker with 5" wheels    Recommendations for Other Services       Precautions / Restrictions Precautions Precautions: Fall Precaution Comments: drain Restrictions Weight Bearing Restrictions: No    Mobility  Bed Mobility Overal bed mobility: Needs Assistance Bed Mobility: Supine to Sit     Supine to sit: Mod assist     General bed mobility comments: assist to raise trunk and scoot to EOB  Transfers Overall transfer level: Needs assistance Equipment used: Rolling walker (2 wheeled) Transfers: Sit to/from Stand Sit to Stand: Mod assist;+2 physical assistance;+2 safety/equipment Stand pivot transfers: Min assist       General transfer comment: Assist to rise, stabilize, control descent. VCs safety, technique, hand/feet placement; SPT x 2 (bed to [facility], then to recliner)  Ambulation/Gait     Assistive device: Bilateral platform walker (EVA walker)   Gait velocity: decreased       Stairs            Wheelchair Mobility    Modified Rankin (Stroke Patients Only)       Balance Overall balance assessment: Needs assistance   Sitting balance-Leahy Scale: Fair     Standing balance support: Bilateral upper extremity supported Standing balance-Leahy Scale: Poor Standing balance comment: requires BUE support                     Cognition Arousal/Alertness: Awake/alert Behavior During Therapy: WFL for tasks assessed/performed Overall Cognitive Status: Within Functional Limits for tasks assessed                      Exercises      General Comments        Pertinent Vitals/Pain Pain Assessment: No/denies pain    Home Living                      Prior Function            PT Goals (current goals can now be found in the care plan section) Acute Rehab PT Goals Patient Stated Goal: less pain, to be able to walk so he can go home PT Goal Formulation: With patient Time For Goal Achievement: 09/03/14 Potential to Achieve Goals: Fair Progress towards PT goals: Progressing toward goals    Frequency  Min 3X/week    PT Plan Current plan remains appropriate    Co-evaluation             End of Session Equipment Utilized During Treatment: Gait belt Activity Tolerance: Patient limited by fatigue Patient left: in chair;with call bell/phone within reach     Time: 1352-1414 PT Time Calculation (min) (ACUTE ONLY): 22 min  Charges:  $Therapeutic Activity: 8-22 mins  G Codes:      Tamala Ser 08/30/2014, 2:34 PM [phone]

## 2014-08-30 NOTE — Progress Notes (Signed)
PROGRESS NOTE  [name]:[id number] DOB: September 30, 1925 DOA: Aug 25, 2014 PCP: Lubertha South, MD  HPI: Patient admitted 4/25 on PCCM service with severe sepsis requiring pressors, acute on chronic CHF, A fib with RVR, coagulopathy with INR > 10, cellulitis. He was seen by Cardiology as well and found to have Chr Systolic HF with new-onset Afib this admission-? 2/2 to Tachyarrythmia related CM. He has been diuresed per Cardiology but has a poor long-term prognosis given inability to use rate controlling agents as well as massive anasarca limiting diuretic use to control edema. His Perc Neprhostomy seeemd to be more bloody overnight 4/30 and patient experienced hypotension requiring an IV saline bolus, Amio gtt was turned off. He has been persistingly hypotensiv.   Subjective / 24 H Interval events - upset this morning as he had an accident in bed - doesn't want to eat   Assessment/Plan: Principal Problem:   Acute respiratory failure Active Problems:   Chronic kidney disease, stage III (moderate)   Sepsis   Acute on chronic renal failure   Essential hypertension   Malnutrition of moderate degree   A-fib   Acute on chronic systolic heart failure   Cellulitis of leg, right   UTI (lower urinary tract infection)   Arterial hypotension   Cardiogenic shock   ADHF (acute decompensated heart failure)   Nephrostomy complication   Pain   Pleural effusion   Aspiration into airway   Attention to urostomy   Goals of care, counseling/discussion   Atrial fibrillation with RVR   Goals of care  - poor insight, appreciate palliative notes - wants to go home, no SNF. No HH or Hospice. Looking for caregivers  Chronic right hydronephrosis secondary to ureteral obstruction  - managed with nephrostomy tube, chronic for at least 1 year - d/w over the phone with Dr. [name] 5/4 - nephrostomy tube migrated, exchanged by IR 5/4  A. fib with RVR - Likely secondary to sepsis.  - Patient had a 2-D  echo done early on in this hospitalization which showed a EF of 30 to 35% with no wall motion abnormalities.  - on Amio, heart rate stable  Acute hypoxic respiratory failure  - secondary to A fib with RVR and acute on chronic systolic CHF.  - significantly volume overloaded/anasarca  Acute on chronic systolic heart failure - in the setting of A fib with RVR, sepsis on admission  Sepsis - on admission, requiring pressors, was on PCCM service - Secondary to Pseudomonas UTI, completed Cefepime on 5/2  Esophageal stricture - clinically not a candidate currently for any intervention  Acute on chronic renal failure with non anion gap metabolic acidosis  Anemia  - Likely anemia of chronic disease. No overt bleeding. Follow H&H.  Thrombocytopenia - Likely secondary to acute infection - stable   Right lower extremity wound - Wound care consult appreciated - Rec's -4 cm x 2 cm wound bed is 100% eschar, separated from wound margins, boggy and fluctuant Cleanse right lateral lower leg with NS and pat gently dry. Apply Santyl ointment, 1/8 inch thickness (opaque). Top with NS moist dressing. Cover with 4x4 gauze and secure with kerlix and tape. Change daily.   Constipation - Senokot-S.    Diet: Diet regular Room service appropriate?: Yes; Fluid consistency:: Thin Fluids: none DVT Prophylaxis: Warfarin  Code Status: DNR Family Communication: no family bedside today  Disposition Plan: dispo planning difficult to predict  Consultants:  Cardiology   Urology   Procedures:  2D echo  Antibiotics  IV cefepime 08/21/2014   IV Zosyn 09-06-14>>>> 08/21/2014  IV vancomycin 06-Sep-2014>>>> 08/21/2014  Oral doxycycline 08/22/2014>>>08/23/14   Studies   2-D echo 08/20/2014  Chest x-ray Sep 06, 2014, 08/19/2014, 08/20/2014, 08/21/2014  X-ray of the hip 08/19/2014  X-ray of the tib-fib 08/19/2014 neg for acute fx, mild knee/ankle joint osteo, extensive peripheral  vascular calcification, focal soft tissue swelling  Objective  Filed Vitals:   08/29/14 1500 08/29/14 2059 08/30/14 0446 08/30/14 0454  BP:  134/113 82/51 86/57  Pulse:  108 91   Temp:  98 F (36.7 C) 97.8 F (36.6 C)   TempSrc:  Oral Oral   Resp:  16 20   Height: 5\' 10"  (1.778 m)     Weight: 100 kg (220 lb 7.4 oz)  94.9 kg (209 lb 3.5 oz)   SpO2:  98% 99%     Intake/Output Summary (Last 24 hours) at 08/30/14 1422 Last data filed at 08/30/14 0839  Gross per 24 hour  Intake      0 ml  Output    600 ml  Net   -600 ml   Filed Weights   08/29/14 0500 08/29/14 1500 08/30/14 0446  Weight: 96.7 kg (213 lb 3 oz) 100 kg (220 lb 7.4 oz) 94.9 kg (209 lb 3.5 oz)   Exam:  General:  Ill appearing  HEENT: no scleral icterus, PERRL  Cardiovascular: irregular, tachycardic, +JVD, 2+ pitting LE edema  Respiratory: shallow breathing, no wheezing, basilar crackles  Abdomen: soft, non tender, BS +, no guarding  Data Reviewed: Basic Metabolic Panel:  Recent Labs Lab 08/26/14 0350 08/27/14 0325 08/28/14 0345 08/29/14 0335 08/30/14 0550  NA 131* 131* 134* 135 137  K 4.6 4.5 4.8 4.8 5.2*  CL 107 109 107 107 108  CO2 16* 14* 18* 16* 17*  GLUCOSE 103* 87 82 85 91  BUN 121* 120* 126* 132* 132*  CREATININE 3.52* 3.59* 3.58* 3.73* 3.94*  CALCIUM 7.9* 7.8* 8.4* 8.2* 8.4*   Liver Function Tests:  Recent Labs Lab 08/25/14 0933 08/26/14 0350 08/27/14 0325 08/28/14 0345 08/29/14 0335  AST 13* 17 14* 19 18  ALT 12* 13* 12* 14* 14*  ALKPHOS 68 80 74 84 85  BILITOT 0.7 0.7 0.8 0.9 0.8  PROT 6.1* 5.8* 5.8* 5.9* 5.8*  ALBUMIN 2.3* 2.3* 2.3* 2.2* 2.2*   CBC:  Recent Labs Lab 08/24/14 0350 08/25/14 0933 08/26/14 0350 08/27/14 0325 08/28/14 0345 08/29/14 0335  WBC 8.2 8.4 8.8 8.4 9.5 9.2  NEUTROABS 7.0 7.5 7.6 7.4 8.5*  --   HGB 9.8* 9.5* 9.1* 9.3* 9.2* 9.2*  HCT 29.9* 29.5* 28.3* 28.1* 27.6* 27.5*  MCV 94.0 94.2 94.0 92.7 92.0 91.1  PLT 107* 120* 124* 124* 144* 146*    Cardiac Enzymes: No results for input(s): CKTOTAL, CKMB, CKMBINDEX, TROPONINI in the last 168 hours. BNP (last 3 results)  Recent Labs  08/21/14 0350 08/22/14 1230 08/23/14 0522  BNP 430.2* 647.7* 1201.9*    ProBNP (last 3 results)  Recent Labs  04/09/14 1038  PROBNP 9656.0*    No results found for this or any previous visit (from the past 240 hour(s)).   Scheduled Meds: . acetaminophen  650 mg Oral TID  . amiodarone  400 mg Oral BID  . antiseptic oral rinse  7 mL Mouth Rinse BID  . collagenase   Topical Daily  . Gerhardt's butt cream   Topical TID  . latanoprost  1 drop Both Eyes QHS  . senna-docusate  1 tablet Oral  QHS  . sodium chloride  3 mL Intravenous Q12H  . Warfarin - Pharmacist Dosing Inpatient   Does not apply q1800   Continuous Infusions: . sodium chloride 10 mL/hr at 08/25/14 0932    Pamella Pert, MD [facility] Pager [phone]. If 7 PM - 7 AM, please contact night-coverage at [email or web address], password [facility] 08/30/2014, 2:22 PM  LOS: 12 days

## 2014-08-30 NOTE — Progress Notes (Signed)
IM also given to pt.

## 2014-08-30 NOTE — Progress Notes (Signed)
Patient is now agreeable  To go to [facility], CM [place] at bedside, nephew at bedside.

## 2014-08-31 ENCOUNTER — Inpatient Hospital Stay (HOSPITAL_COMMUNITY): Payer: Medicare Other

## 2014-08-31 MED ORDER — SODIUM CHLORIDE 0.9 % IV BOLUS (SEPSIS)
500.0000 mL | Freq: Once | INTRAVENOUS | Status: AC
  Administered 2014-08-31: 500 mL via INTRAVENOUS

## 2014-08-31 MED ORDER — CIPROFLOXACIN IN D5W 400 MG/200ML IV SOLN
400.0000 mg | INTRAVENOUS | Status: AC
  Administered 2014-08-31: 400 mg via INTRAVENOUS

## 2014-08-31 MED ORDER — IOHEXOL 300 MG/ML  SOLN
50.0000 mL | Freq: Once | INTRAMUSCULAR | Status: AC | PRN
  Administered 2014-08-31: 20 mL

## 2014-08-31 MED ORDER — CIPROFLOXACIN IN D5W 400 MG/200ML IV SOLN
INTRAVENOUS | Status: AC
  Filled 2014-08-31: qty 200

## 2014-08-31 NOTE — Progress Notes (Signed)
PROGRESS NOTE  [name] CXK:[id number] DOB: Feb 08, 1926 DOA: 07/26/2014 PCP: Lubertha South, MD  HPI: Patient admitted 4/25 on PCCM service with severe sepsis requiring pressors, acute on chronic CHF, A fib with RVR, coagulopathy with INR > 10, cellulitis. He was seen by Cardiology as well and found to have Chr Systolic HF with new-onset Afib this admission-? 2/2 to Tachyarrythmia related CM. He has been diuresed per Cardiology but has a poor long-term prognosis given inability to use rate controlling agents as well as massive anasarca limiting diuretic use to control edema. His Perc Neprhostomy seeemd to be more bloody overnight 4/30 and patient experienced hypotension requiring an IV saline bolus, Amio gtt was turned off. He has been persistingly hypotensiv.   Subjective / 24 H Interval events - pulled his nephrostomy tube this morning - initially agitated, later calmer and sleeping  Assessment/Plan: Principal Problem:   Acute respiratory failure Active Problems:   Chronic kidney disease, stage III (moderate)   Sepsis   Acute on chronic renal failure   Essential hypertension   Malnutrition of moderate degree   A-fib   Acute on chronic systolic heart failure   Cellulitis of leg, right   UTI (lower urinary tract infection)   Arterial hypotension   Cardiogenic shock   ADHF (acute decompensated heart failure)   Nephrostomy complication   Pain   Pleural effusion   Aspiration into airway   Attention to urostomy   Goals of care, counseling/discussion   Atrial fibrillation with RVR   Chronic right hydronephrosis secondary to ureteral obstruction  - managed with nephrostomy tube, chronic for at least 1 year - d/w over the phone with Dr. [name] 5/4 - nephrostomy tube migrated, exchanged by IR 5/4 - tube completely pulled out 5/7, discussed with IR, will be placed again  Goals of care  - poor insight, appreciate palliative notes - finally agreed to SNF however he pulled his  nephrostomy tube this morning  A. fib with RVR - Likely secondary to sepsis.  - Patient had a 2-D echo done early on in this hospitalization which showed a EF of 30 to 35% with no wall motion abnormalities.  - on Amio, heart rate stable  Acute hypoxic respiratory failure  - secondary to A fib with RVR and acute on chronic systolic CHF.  - significantly volume overloaded/anasarca  Acute on chronic systolic heart failure - in the setting of A fib with RVR, sepsis on admission  Sepsis - on admission, requiring pressors, was on PCCM service - Secondary to Pseudomonas UTI, completed Cefepime on 5/2  Esophageal stricture - clinically not a candidate currently for any intervention  Acute on chronic renal failure with non anion gap metabolic acidosis  Anemia  - Likely anemia of chronic disease. No overt bleeding. Follow H&H.  Thrombocytopenia - Likely secondary to acute infection - stable   Right lower extremity wound - Wound care consult appreciated - Rec's -4 cm x 2 cm wound bed is 100% eschar, separated from wound margins, boggy and fluctuant Cleanse right lateral lower leg with NS and pat gently dry. Apply Santyl ointment, 1/8 inch thickness (opaque). Top with NS moist dressing. Cover with 4x4 gauze and secure with kerlix and tape. Change daily.   Constipation - Senokot-S.    Diet: Diet regular Room service appropriate?: Yes; Fluid consistency:: Thin Fluids: none DVT Prophylaxis: Warfarin  Code Status: Full Code Family Communication: d/w granddaughter Disposition Plan: dispo planning difficult to predict  Consultants:  Cardiology   Urology  Procedures:  2D echo   Antibiotics  IV cefepime 08/21/2014   IV Zosyn 08/11/2014>>>> 08/21/2014  IV vancomycin 08/17/2014>>>> 08/21/2014  Oral doxycycline 08/22/2014>>>08/23/14   Studies   2-D echo 08/20/2014  Chest x-ray 08/17/2014, 08/19/2014, 08/20/2014, 08/21/2014  X-ray of the hip  08/19/2014  X-ray of the tib-fib 08/19/2014 neg for acute fx, mild knee/ankle joint osteo, extensive peripheral vascular calcification, focal soft tissue swelling  Objective  Filed Vitals:   08/30/14 0454 08/30/14 1504 08/30/14 2021 08/31/14 0618  BP: 83/53  Pulse:  108  114  Temp:  97.4 F (36.3 C) 97.6 F (36.4 C) 97.4 F (36.3 C)  TempSrc:  Oral Oral Oral  Resp:  Height:      Weight:    93.5 kg (206 lb 2.1 oz)  SpO2:  95% 99% 99%    Intake/Output Summary (Last 24 hours) at 08/31/14 1532 Last data filed at 08/31/14 1100  Gross per 24 hour  Intake      0 ml  Output      0 ml  Net      0 ml   Filed Weights   08/29/14 1500 08/30/14 0446 08/31/14 0618  Weight: 100 kg (220 lb 7.4 oz) 94.9 kg (209 lb 3.5 oz) 93.5 kg (206 lb 2.1 oz)   Exam:  General:  Ill appearing, drowsy   HEENT: no scleral icterus, PERRL  Cardiovascular: irregular, tachycardic, +JVD, 2+ pitting LE edema  Respiratory: shallow breathing, no wheezing, basilar crackles  Abdomen: soft, non tender, BS +, no guarding  Data Reviewed: Basic Metabolic Panel:  Recent Labs Lab 08/26/14 0350 08/27/14 0325 08/28/14 0345 08/29/14 0335 08/30/14 0550  NA 131* 131* 134* 135 137  K 4.6 4.5 4.8 4.8 5.2*  CL 107 109 107 107 108  CO2 16* 14* 18* 16* 17*  GLUCOSE 103* 87 82 85 91  BUN 121* 120* 126* 132* 132*  CREATININE 3.52* 3.59* 3.58* 3.73* 3.94*  CALCIUM 7.9* 7.8* 8.4* 8.2* 8.4*   Liver Function Tests:  Recent Labs Lab 08/25/14 0933 08/26/14 0350 08/27/14 0325 08/28/14 0345 08/29/14 0335  AST 13* 17 14* 19 18  ALT 12* 13* 12* 14* 14*  ALKPHOS 68 80 74 84 85  BILITOT 0.7 0.7 0.8 0.9 0.8  PROT 6.1* 5.8* 5.8* 5.9* 5.8*  ALBUMIN 2.3* 2.3* 2.3* 2.2* 2.2*   CBC:  Recent Labs Lab 08/25/14 0933 08/26/14 0350 08/27/14 0325 08/28/14 0345 08/29/14 0335  WBC 8.4 8.8 8.4 9.5 9.2  NEUTROABS 7.5 7.6 7.4 8.5*  --   HGB 9.5* 9.1* 9.3* 9.2* 9.2*  HCT 29.5* 28.3* 28.1*  27.6* 27.5*  MCV 94.2 94.0 92.7 92.0 91.1  PLT 120* 124* 124* 144* 146*   Cardiac Enzymes: No results for input(s): CKTOTAL, CKMB, CKMBINDEX, TROPONINI in the last 168 hours. BNP (last 3 results)  Recent Labs  08/21/14 0350 08/22/14 1230 08/23/14 0522  BNP 430.2* 647.7* 1201.9*    ProBNP (last 3 results)  Recent Labs  04/09/14 1038  PROBNP 9656.0*    No results found for this or any previous visit (from the past 240 hour(s)).   Scheduled Meds: . acetaminophen  650 mg Oral TID  . amiodarone  400 mg Oral BID  . antiseptic oral rinse  7 mL Mouth Rinse BID  . ciprofloxacin      . collagenase   Topical Daily  . Gerhardt's butt cream   Topical TID  . latanoprost  1 drop Both Eyes QHS  .  senna-docusate  1 tablet Oral QHS  . sodium chloride  3 mL Intravenous Q12H  . Warfarin - Pharmacist Dosing Inpatient   Does not apply q1800   Continuous Infusions: . sodium chloride 10 mL/hr at 08/25/14 0932    Pamella Pert, MD [facility] Pager [phone]. If 7 PM - 7 AM, please contact night-coverage at [email or web address], password [facility] 08/31/2014, 3:32 PM  LOS: 13 days

## 2014-08-31 NOTE — Progress Notes (Signed)
One family member at beside now. States she does not want to to be too aggressive about BP but just wants him to remain comfortable. Frequently checking BP and breathing on patient. Pt's eyes are closed and breathing is audible from doorway. Will continue to monitor.

## 2014-08-31 NOTE — Clinical Social Work Note (Signed)
CSW reviewed chart for possible pt discharge to [facility] this weekend and pt not ready for discharge today per MD notes  CSW spoke with Lanora Manis who is on call at [facility] this weekend to discuss discharge and let her know that pt may be ready for discharge to [facility] tomorrow  CSW will continue to follow pt until discharge  .Elray Buba, LCSW [facility] Clinical Social Worker - Weekend Coverage cell #: [phone]

## 2014-08-31 NOTE — Progress Notes (Signed)
Granddaughter POA is at bedside. More family is going to arrive. Pt's BP did not respond to bolus. Family asking for comfort care. Will continue to monitor.

## 2014-08-31 NOTE — Procedures (Signed)
Interventional Radiology Procedure Note  Procedure: Replacement of right sided nephrostomy tube.  Large volume of old blood returned from renal pelvis.    Complications: None  Estimated Blood Loss: None actuely  Recommendations: - Routine tube care  Signed,  Sterling Big, MD

## 2014-08-31 NOTE — Progress Notes (Signed)
Patient's grand-daughter noted to sign the MOST Form but wanted to take a copy home to read before signing it.

## 2014-08-31 NOTE — Progress Notes (Signed)
Patient ID: [name], male   DOB: Mar 01, 1926, 81 y.o.   MRN: [id number] Pt pulled out rt PCN today; s/p reposition/exchange of rt PCN 5/5 (hx chronic rt UPJ obstruction); scheduled for replacement today; consent obtained from granddaughter- pt confused.

## 2014-08-31 NOTE — Progress Notes (Signed)
Patient refused blood drawn for Lab work this morning. Patient stated that he wanted to speak to a MD.

## 2014-08-31 NOTE — Progress Notes (Signed)
Vitals reviewed by RN. Pt is comfort care. Will continue to monitor.

## 2014-08-31 NOTE — Progress Notes (Signed)
Patient  Pulled his Nephrostomy tube out, confused, very restless, agitated, crying, asking for the police to come to help put it back. PRN meds given. MD paged made aware.

## 2014-09-01 MED ORDER — DIPHENHYDRAMINE HCL 50 MG/ML IJ SOLN: 12.5000 mg | INTRAMUSCULAR | Status: DC | PRN

## 2014-09-01 MED ORDER — GERHARDT'S BUTT CREAM
TOPICAL_CREAM | Freq: Three times a day (TID) | CUTANEOUS | Status: DC | PRN
  Filled 2014-09-01: qty 1

## 2014-09-01 MED ORDER — SODIUM CHLORIDE 0.9 % IV SOLN
25.0000 ug/h | INTRAVENOUS | Status: DC
  Administered 2014-09-01: 50 ug/h via INTRAVENOUS
  Filled 2014-09-01: qty 50

## 2014-09-01 MED ORDER — DIAZEPAM 5 MG/ML IJ SOLN
5.0000 mg | INTRAMUSCULAR | Status: DC | PRN
  Administered 2014-09-01: 5 mg via INTRAVENOUS
  Filled 2014-09-01: qty 2

## 2014-09-01 MED ORDER — HYDROCORTISONE 1 % EX LOTN
TOPICAL_LOTION | CUTANEOUS | Status: DC | PRN
  Filled 2014-09-01: qty 118

## 2014-09-01 MED ORDER — FENTANYL BOLUS VIA INFUSION
25.0000 ug | INTRAVENOUS | Status: DC | PRN
Start: 2014-09-01 — End: 2014-09-01
  Filled 2014-09-01: qty 25

## 2014-09-01 MED ORDER — FENTANYL CITRATE (PF) 100 MCG/2ML IJ SOLN
50.0000 ug | Freq: Once | INTRAMUSCULAR | Status: AC
  Administered 2014-09-01: 50 ug via INTRAVENOUS
  Filled 2014-09-01: qty 2

## 2014-09-02 ENCOUNTER — Ambulatory Visit: Payer: Medicare Other | Admitting: Cardiology

## 2014-09-02 ENCOUNTER — Encounter: Payer: Self-pay | Admitting: *Deleted

## 2014-09-05 ENCOUNTER — Ambulatory Visit: Payer: Medicare Other | Admitting: Cardiology

## 2014-09-17 ENCOUNTER — Other Ambulatory Visit (HOSPITAL_COMMUNITY): Payer: Medicare Other

## 2014-09-24 ENCOUNTER — Ambulatory Visit: Payer: Self-pay | Admitting: *Deleted

## 2014-09-24 DIAGNOSIS — Z5181 Encounter for therapeutic drug level monitoring: Secondary | ICD-10-CM

## 2014-09-24 DIAGNOSIS — I482 Chronic atrial fibrillation, unspecified: Secondary | ICD-10-CM

## 2014-09-25 NOTE — Progress Notes (Signed)
PROGRESS NOTE  [name] XID:[id number] DOB: [birth year] DOA: 09-10-2014 PCP: Lubertha South, MD  HPI: Patient admitted 4/25 on PCCM service with severe sepsis requiring pressors, acute on chronic CHF, A fib with RVR, coagulopathy with INR > 10, cellulitis. He was seen by Cardiology as well and found to have Chr Systolic HF with new-onset Afib this admission-? 2/2 to Tachyarrythmia related CM. He has been diuresed per Cardiology but has a poor long-term prognosis given inability to use rate controlling agents as well as massive anasarca limiting diuretic use to control edema. His Perc Neprhostomy seeemd to be more bloody overnight 4/30 and patient experienced hypotension requiring an IV saline bolus, Amio gtt was turned off. He has been persistingly hypotensive.   Subjective / 24 H Interval events - progressive decline in the past 24h as expected  Assessment/Plan: Principal Problem:   Acute respiratory failure Active Problems:   Chronic kidney disease, stage III (moderate)   Sepsis   Acute on chronic renal failure   Essential hypertension   Malnutrition of moderate degree   A-fib   Acute on chronic systolic heart failure   Cellulitis of leg, right   UTI (lower urinary tract infection)   Arterial hypotension   Cardiogenic shock   ADHF (acute decompensated heart failure)   Nephrostomy complication   Pain   Pleural effusion   Aspiration into airway   Attention to urostomy   Goals of care, counseling/discussion   Atrial fibrillation with RVR   Goals of care  - discussed with Dr. Phillips Odor today as well as patient's POA. He has been declining and is now lethargic, confused, moaning and severely hypotensive. Not stable for transport outside this facility, focus care on comfort, heading towards [facility] if he survives today Chronic right hydronephrosis secondary to ureteral obstruction  - has a nephrostomy tube A. fib with RVR  - off telemetry as now comfort Acute hypoxic respiratory  failure  - significantly volume overloaded/anasarca Acute on chronic systolic heart failure - in the setting of A fib with RVR, sepsis on admission Sepsis - on admission, requiring pressors, secondary to Pseudomonas UTI, completed Cefepime on 5/2 Esophageal stricture - clinically not a candidate currently for any intervention Acute on chronic renal failure with non anion gap metabolic acidosis Anemia  Thrombocytopenia Right lower extremity wound   Diet: DIET SOFT Room service appropriate?: No; Fluid consistency:: Thin Fluids: none DVT Prophylaxis: Warfarin  Code Status: DNR Family Communication: d/w granddaughter Disposition Plan: anticipate in hospital death  Consultants:  Cardiology   Urology   Procedures:  2D echo   Antibiotics  IV cefepime 08/21/2014   IV Zosyn 09-10-14>>>> 08/21/2014  IV vancomycin 09-10-2014>>>> 08/21/2014  Oral doxycycline 08/22/2014>>>08/23/14   Studies   2-D echo 08/20/2014  Chest x-ray Sep 10, 2014, 08/19/2014, 08/20/2014, 08/21/2014  X-ray of the hip 08/19/2014  X-ray of the tib-fib 08/19/2014 neg for acute fx, mild knee/ankle joint osteo, extensive peripheral vascular calcification, focal soft tissue swelling  Objective  Filed Vitals:   08/31/14 2236 08/31/14 2257 09/03/2014 0043 09/06/2014 0540  BP: 67/43 67/42 64/38  61/38  Pulse:    72  Temp:      TempSrc:      Resp:    22  Height:      Weight:    94.2 kg (207 lb 10.8 oz)  SpO2:    96%    Intake/Output Summary (Last 24 hours) at 09/14/2014 1208 Last data filed at 08/31/14 2023  Gross per 24 hour  Intake  5 ml  Output    270 ml  Net   -265 ml   Filed Weights   08/30/14 0446 08/31/14 0618 2014/09/30 0540  Weight: 94.9 kg (209 lb 3.5 oz) 93.5 kg (206 lb 2.1 oz) 94.2 kg (207 lb 10.8 oz)   Exam:  General: lethargic, moaning  Data Reviewed: Basic Metabolic Panel:  Recent Labs Lab 08/26/14 0350 08/27/14 0325 08/28/14 0345 08/29/14 0335 08/30/14 0550  NA  131* 131* 134* 135 137  K 4.6 4.5 4.8 4.8 5.2*  CL 107 109 107 107 108  CO2 16* 14* 18* 16* 17*  GLUCOSE 103* 87 82 85 91  BUN 121* 120* 126* 132* 132*  CREATININE 3.52* 3.59* 3.58* 3.73* 3.94*  CALCIUM 7.9* 7.8* 8.4* 8.2* 8.4*   Liver Function Tests:  Recent Labs Lab 08/26/14 0350 08/27/14 0325 08/28/14 0345 08/29/14 0335  AST 17 14* 19 18  ALT 13* 12* 14* 14*  ALKPHOS 80 74 84 85  BILITOT 0.7 0.8 0.9 0.8  PROT 5.8* 5.8* 5.9* 5.8*  ALBUMIN 2.3* 2.3* 2.2* 2.2*   CBC:  Recent Labs Lab 08/26/14 0350 08/27/14 0325 08/28/14 0345 08/29/14 0335  WBC 8.8 8.4 9.5 9.2  NEUTROABS 7.6 7.4 8.5*  --   HGB 9.1* 9.3* 9.2* 9.2*  HCT 28.3* 28.1* 27.6* 27.5*  MCV 94.0 92.7 92.0 91.1  PLT 124* 124* 144* 146*   BNP (last 3 results)  Recent Labs  08/21/14 0350 08/22/14 1230 08/23/14 0522  BNP 430.2* 647.7* 1201.9*    ProBNP (last 3 results)  Recent Labs  04/09/14 1038  PROBNP 9656.0*    Scheduled Meds: . antiseptic oral rinse  7 mL Mouth Rinse BID  . collagenase   Topical Daily  . fentaNYL (SUBLIMAZE) injection  50 mcg Intravenous Once  . latanoprost  1 drop Both Eyes QHS  . sodium chloride  3 mL Intravenous Q12H   Continuous Infusions: . sodium chloride 10 mL/hr at 08/25/14 0932  . fentaNYL infusion INTRAVENOUS     Time spent: 15 minutes  Pamella Pert, MD [facility] Pager [phone]. If 7 PM - 7 AM, please contact night-coverage at [email or web address], password [facility] 09/30/14, 12:08 PM  LOS: 14 days

## 2014-09-25 NOTE — Progress Notes (Signed)
Cloyde has declined as anticipated. Minimally responsive, diffuse rash with pruritus. Severe hypotension.Anticipate hospital death. I have placed him on Fentanyl infusion for pain and dyspnea control-moaning and grimacing. IV benedryl for itching. Family at bedside.  Anderson Malta, DO Palliative Medicine

## 2014-09-25 NOTE — Discharge Summary (Signed)
Physician Death Summary  [name] XTG:[id number] DOB: 23-Oct-1925 DOA: 09-04-2014  PCP: Lubertha South, MD  Admit date: 2014/09/04 Death date: 09-18-2014   Discharge Diagnoses:  Principal Problem:   Acute respiratory failure Active Problems:   Chronic kidney disease, stage III (moderate)   Sepsis   Acute on chronic renal failure   Essential hypertension   Malnutrition of moderate degree   A-fib   Acute on chronic systolic heart failure   Cellulitis of leg, right   UTI (lower urinary tract infection)   Arterial hypotension   Cardiogenic shock   ADHF (acute decompensated heart failure)   Nephrostomy complication   Pain   Pleural effusion   Aspiration into airway   Attention to urostomy   Goals of care, counseling/discussion   Atrial fibrillation with RVR  History of present illness:  81yo male with extensive PMH including Afib on coumadin, CKD III, chronic sCHF (EF 35%), PAD presented 2022-09-04 to [facility] ED with c/o RLE wound with concern for ischemia. Also found to have acute on CKD, elevated lactate and borderline BP and was tx to [facility] to PCCM with ?sepsis. Has been falling more at home. Saw his PCP after a fall last week with ongoing SOB and pleural effusion. Currently feeling a little better. Biggest complaint is BLE pain. Denies chest pain, SOB, bleeding, lightheadedness.   Hospital Course:  Goals of care - Patient was admitted to the hospital on 4/25 with sepsis in the setting of Pseudomonas UTI. His hospital course was complicated by persistent hypotension, fluid overload, systolic heart failure with cardiorenal syndrome causing progressive renal failure resulting in subsequent encephalopathy as below per problem list.  Palliative care was consulted during patient's hospitalization, and after multiple discussions with the patient, the patient's family, but decision was made to discharge patient to skilled nursing facility. Before that could happen, he clinically  deteriorated to the point that he became unstable for transfer, and his medical management was transitioned to full comfort per family wishes. Patient passed away on 09/18/2014. Septic shock - on admission, requiring pressors, was on PCCM service - Secondary to Pseudomonas UTI, completed Cefepime on 5/2 Chronic right hydronephrosis secondary to ureteral obstruction  - managed with nephrostomy tube, chronic for at least 1 year - nephrostomy tube migrated, exchanged by IR 5/4 - tube completely pulled out 5/7, discussed with IR, will be placed again A. fib with RVR - Likely secondary to sepsis.  - This was very difficult to control despite amiodarone, patient could not tolerate AV nodal blocking agents due to hypotension Acute hypoxic respiratory failure  - secondary to A fib with RVR and acute on chronic systolic CHF and persistent hypotension unable to do appropriate diuresis Acute on chronic systolic heart failure - in the setting of A fib with RVR, sepsis on admission Esophageal stricture - resulting in malnutrition Acute on chronic renal failure with non anion gap metabolic acidosis - With progressive worsening of his renal failure in the setting of cardiorenal syndrome as well as chronic nephrostomy tube needing replacement 2 during this hospitalization  Anemia  Thrombocytopenia Right lower extremity wound Acute encephalopathy - due to renal failure   Procedures: 2-D echo 08/20/2014 Chest x-ray 09-04-2014, 08/19/2014, 08/20/2014, 08/21/2014 X-ray of the hip 08/19/2014 X-ray of the tib-fib 08/19/2014 neg for acute fx, mild knee/ankle joint osteo, extensive peripheral vascular calcification, focal soft tissue swelling  2D echo Nephrostomy tube exchange 5/4 Replacement of nephrostomy tube 5/7  Consultations:  PCCM  Urology  Interventional  Radiology  Cardiology  Palliative     The results of significant diagnostics from this hospitalization (including imaging,  microbiology, ancillary and laboratory) are listed below for reference.    Significant Diagnostic Studies: Dg Chest 1 View  08/23/2014   CLINICAL DATA:  Evaluate pleural effusion.  History of CHF.  EXAM: CHEST  1 VIEW  COMPARISON:  08/22/2014  FINDINGS: Previous median sternotomy and CABG procedure. Aortic atherosclerosis noted. The heart size is enlarged. There is a right pleural effusion which is unchanged in volume from previous exam. Mild interstitial edema is noted.  IMPRESSION: 1. Stable CHF pattern.   Electronically Signed   By: Signa Kell M.D.   On: 08/23/2014 14:01   Dg Chest 2 View  08/25/2014   CLINICAL DATA:  81 year old male with a history of leg swelling  EXAM: CHEST - 2 VIEW  COMPARISON:  08/02/2014  FINDINGS: Cardiomediastinal silhouette unchanged. Atherosclerotic calcifications of the aortic arch.  Surgical changes of median sternotomy.  Dense opacity at the right base persists, unchanged from prior. No left pleural effusion identified.  No pneumothorax.  No displaced fracture.  Unremarkable appearance of the upper abdomen.  IMPRESSION: Persisting right pleural effusion with associated atelectasis. Infection difficult to exclude.  Atherosclerosis with evidence of median sternotomy.  Signed,  Yvone Neu. Loreta Ave, DO  [facility]  [facility]   Electronically Signed   By: Gilmer Mor D.O.   On: 2014/08/25 21:39   Dg Tibia/fibula Right  08/19/2014   CLINICAL DATA:  Right leg pain.  No known injury.  EXAM: RIGHT TIBIA AND FIBULA - 2 VIEW  COMPARISON:  None.  FINDINGS: Focal soft tissue swelling is seen along the anterior aspect of the tibial diaphysis. No evidence of fracture or other focal bone lesions.  Mild degenerative spurring is seen involving the patella and ankle joint. Extensive peripheral vascular calcification is noted.  IMPRESSION: Focal soft tissue swelling along the anterior aspect of the tibial diaphysis. No evidence of fracture.   Mild knee and ankle joint osteoarthritis.  Extensive peripheral vascular calcification.   Electronically Signed   By: Myles Rosenthal M.D.   On: 08/19/2014 14:39   Dg Chest Port 1 View  08/25/2014   CLINICAL DATA:  Cough with concern for aspiration  EXAM: PORTABLE CHEST - 1 VIEW  COMPARISON:  August 23, 2014  FINDINGS: There is stable cardiomegaly. Patient is status post median sternotomy. There is a right effusion which appears stable. There is trace interstitial edema, slightly less than on recent prior study. No new opacity. No adenopathy. Pulmonary vascularity within normal limits.  IMPRESSION: Trace interstitial edema, less than on recent prior study. Cardiomegaly with right effusion persist. No airspace consolidation. The appearance does suggest a degree of congestive heart failure, less than on recent prior study.   Electronically Signed   By: Bretta Bang III M.D.   On: 08/25/2014 09:19   Dg Chest Port 1 View  08/22/2014   CLINICAL DATA:  Shortness of Breath  EXAM: PORTABLE CHEST - 1 VIEW  COMPARISON:  August 21, 2014  FINDINGS: There is cardiomegaly with pulmonary venous hypertension. There is a right pleural effusion. There is interstitial edema throughout the lungs. There is no appreciable airspace consolidation. There is atelectatic change in the medial right base.  IMPRESSION: Findings consistent with congestive heart failure. Atelectasis medial right base, stable. No new opacity.   Electronically Signed   By: Bretta Bang III M.D.   On: 08/22/2014 12:12  Dg Chest Port 1 View  08/21/2014   CLINICAL DATA:  Pleural effusion.  EXAM: PORTABLE CHEST - 1 VIEW  COMPARISON:  08/17/2014.  FINDINGS: Mediastinum hilar structures stable. Prior median sternotomy. Stable cardiomegaly. Mild pulmonary vascular prominence and interstitial prominence suggesting mild congestive heart failure. Right pleural effusion again noted and unchanged. Tiny left pleural effusion cannot be excluded. No pneumothorax.   IMPRESSION: Findings consistent with mild congestive heart failure with mild pulmonary interstitial edema and stable right pleural effusion. Tiny left pleural effusion cannot be excluded . Prior median sternotomy.   Electronically Signed   By: Maisie Fus  Register   On: 08/21/2014 07:14   Dg Chest Port 1 View  08/20/2014   CLINICAL DATA:  Pleural effusion.  Cardiomyopathy.  EXAM: PORTABLE CHEST - 1 VIEW  COMPARISON:  08/02/2014.  FINDINGS: The heart is enlarged. A moderate size right pleural effusion may be slightly increased this likely layering with patient at night angle. A small left pleural effusion is now evident. Mild edema has increased. Bibasilar airspace disease is noted. The visualized soft tissues and bony thorax are unremarkable.  IMPRESSION: 1. Congestive heart failure with progressive edema. 2. Stable slight increase in right pleural effusion. 3. New small left pleural effusion.   Electronically Signed   By: Marin Roberts M.D.   On: 08/20/2014 07:27   Dg Abd Portable 1v  08/25/2014   CLINICAL DATA:  Abdominal distention  EXAM: PORTABLE ABDOMEN - 1 VIEW  COMPARISON:  CT abdomen and pelvis July 03, 2014  FINDINGS: There is a drainage catheter in the right lateral abdomen. There is no appreciable bowel dilatation or air-fluid level suggesting obstruction. No free air is seen on this supine examination. There is a stent in the right iliac artery region. There are coils in this area as well. There is extensive arthropathy in the lumbar spine.  IMPRESSION: Bowel gas pattern unremarkable. Drain lateral right abdomen. Vascular stent and coils right pelvis.   Electronically Signed   By: Bretta Bang III M.D.   On: 08/25/2014 09:58   Dg Swallowing Func-speech Pathology  08/26/2014    Objective Swallowing Evaluation:    Patient Details  Name: [name] MRN: [id number] Date of Birth: April 04, 1926  Today's Date: 08/26/2014 Time: SLP Start Time (ACUTE ONLY): 1400-SLP Stop Time (ACUTE ONLY): 1435 SLP  Time Calculation (min) (ACUTE ONLY): 35 min  Past Medical History:  Past Medical History  Diagnosis Date  . Hyperlipidemia   . White coat hypertension   . Cardiomyopathy      EF:30-40% in 06/1998, but 50-55% in 2009;  HX ISCHEMIC CARDIOMYOPATHY  . Chronic atrial fibrillation   . Vitamin B12 deficiency   . Chronic anticoagulation   . Arthritis   . Sensory neuropathy   . Chronic kidney disease, stage III (moderate)     creatinine 1.5 2001, 2.0 2009, 2.28 in 12/2009  . Hydronephrosis, bilateral   . Ureteral calculi     BILATERAL  . History of CHF (congestive heart failure)     1999  &  2005  . History of kidney stones   . Bilateral renal cysts   . Aneurysm of infrarenal abdominal aorta 3.9 x 3.6 no change 8/05 no  change in 2010;  4.7 in 2013    MONITORED BY DICKSON-- LAST VISIT OCT 2013--  4.7CM  . Chronic venous insufficiency   . PAD (peripheral artery disease)   . Edema of both legs   . History of gout     per  pt stable  . Glaucoma of both eyes   . Wears hearing aid     bilateral  . CHF (congestive heart failure)   . Shortness of breath dyspnea   . Arrhythmia     Afib CHADS VASC Score of 5.   Past Surgical History:  Past Surgical History  Procedure Laterality Date  . Repair thoracic aorta  01/1995    s/p rupture  . Cataract extraction w/ intraocular lens  implant, bilateral  2012  . Endovascular right hypogastric artery aneurysm repair with graft   08-12-2003  DR DICKSON  . Laparoscopic cholecystectomy  JAN 2005  . Percutaneous nephrostolithotomy  1970's  . Abdominal aortagram  07-16-2003  DR ROTHBART    W/ COIL EMBOLIZATION OF SIX BRANCHES OF RIGHT INTERNAL RENAL ARTERY  ANEURYSM  . Cardiovascular stress test  04-25-2003    LOW RISK CARDIOLITE STUDY/ MODERATE LV DILATATION AND IMPAIRMENT OF  LVSF/  NO ISCHEMIA  . Transthoracic echocardiogram  02-07-2008  DR ROTHBART    MODERATE DILATED LV/  EF 50-55%/ MILD AR/ MILD TO MODERATE AORTIC ROOT  DILATATION/ MODERATE MR/ MODERATE LA   &  RA   DIILATED/ MILD TO MODERATE  TR   . Cystoscopy w/ ureteral stent placement Bilateral 10/24/2012    Procedure: CYSTOSCOPY WITH RETROGRADE PYELOGRAM/URETERAL STENT  PLACEMENT;  Surgeon: Lindaann Slough, MD;  [facility]: [facility];  Service: Urology;  Laterality: Bilateral;  . Cystoscopy with ureteroscopy Left 11/27/2012    Procedure: CYSTOSCOPY,LEFT URETEROSCOPY WITH LASER LITHOTRIPSY/REMOVAL  OF MIGRATED STENT, PLACEMENT OF LEFT STENT;  Surgeon: Garnett Farm, MD;   Location: [facility];  Service: Urology;  Laterality: Left;  . Holmium laser application Left 11/27/2012    Procedure: HOLMIUM LASER APPLICATION;  Surgeon: Garnett Farm, MD;   Location: [facility];  Service: Urology;  Laterality: Left;  . Cystoscopy with ureteroscopy and stent placement N/A 12/29/2012    Procedure: CYSTOSCOPY WITH URETEROSCOPY AND STENT PLACEMENT, removal of  right and left stents, retrogrades, right stent exchange;  Surgeon: Garnett Farm, MD;  Location: [facility];  Service: Urology;  Laterality: N/A;  . Holmium laser application Right 12/29/2012    Procedure: HOLMIUM LASER APPLICATION;  Surgeon: Garnett Farm, MD;   Location: [facility];  Service: Urology;  Laterality: Right;  HLL OF (RT)  URETERAL PELVIC JUNCTION STONE  . Cystoscopy with retrograde pyelogram, ureteroscopy and stent placement  Right 01/29/2013    Procedure: CYSTOSCOPY WITH RIGHT URETEROSCOPY AND STENT PLACEMENT;   Surgeon: Garnett Farm, MD;  Location: [facility];  Service: Urology;   Laterality: Right;  . Holmium laser application N/A 01/29/2013    Procedure: HOLMIUM LASER APPLICATION;  Surgeon: Garnett Farm, MD;   Location: [facility];  Service: Urology;  Laterality: N/A;  . Esophagogastroduodenoscopy N/A 02/16/2013    Procedure: ESOPHAGOGASTRODUODENOSCOPY (EGD);  Surgeon: Meryl Dare,  MD;  Location: [facility];  Service: Endoscopy;  Laterality: N/A;  . Cystoscopy with retrograde pyelogram, ureteroscopy and stent placement  Right 05/07/2013    Procedure: CYSTOSCOPY WITH RIGHT RETROGRADE PYELOGRAM, Balloon  dilation  of right ureter, Laser incision of right ureter, Nephrostogram;  Surgeon:  Garnett Farm, MD;  Location: [facility];  Service: Urology;  Laterality:  Right;  . Holmium laser application Right 05/07/2013    Procedure: HOLMIUM LASER APPLICATION;  Surgeon: Garnett Farm, MD;   Location: [facility];  Service: Urology;  Laterality: Right;  . Cystoscopy with retrograde pyelogram, ureteroscopy and stent placement  Left 07/15/2014    Procedure: LEFT URETEROSCOPY RETROGRADE PYELOGRAM TRANSECTION OF BLADDER  TUMOR WITH BLADDER LESION BIOPSIES;  Surgeon: Ihor Gully, MD;  Location:  [facility];  Service: Urology;  Laterality: Left;   HPI:  Other Pertinent Information: 81 y/o male with extensive PMH including Afib  on coumadin, CKD III, chronic sCHF (EF 35%), PAD presented 4/24 to [facility] ED with c/o RLE wound with concern for ischemia. Also found to have  acute on CKD, elevated lactate and borderline BP and was tx to [facility]  to PCCM with ?sepsis.CXR 4/24-unchanged persistent chornic right pleural  effusion. MD notes concern for aspiration with esophageal dysmotility.   No Data Recorded  Assessment / Plan / Recommendation Pt presents with moderate oral and severe pharyno-cervical esophageal  dysphagia.   Pt is aspirating secretions evidenced by wet vocal quality and  expectoration of viscous secretions into emesis basin during MBS. Pt  admits to issues with expectorating secretions/having a hard time  swallowing for at least six months.    Sensorimotor deficits result in gross pharyngeal  residuals(vallecular/pyriform sinus) across all consistencies.   Note pt h/o degenerative disc disease at primarily C5-C6, C6-C7 per  imaging study in 2015 impacting clearance of barium in esophagus. Suspect  chronic dysphagia for which pt managed when well but with decreased  functional reserve compensation is impacted.   Gross residuals noted across consistencies with pt requiring up to 5  swallows to clear tsp of  pudding. Aspiration (mostly silent unless  grossly) both during and after swallow with liquids despite strategies  including chin tuck and head turn. Increased viscosity resulted in  increased residuals.   Fortunately pt able to expectorate throughout MBS and clear approximately  90% of barium residuals from pharynx- which largely is protecting his  airway. Suspect his level of dysphagia is contributing to his compromised  nutrition.  Using live video and verbal cues/instruction/teach back during MBS, SLP  educated pt to findings. Advised pt that he is at high risk of aspiration  and aspirated across all consistencies during testing despite compensation  strategies. Although pt did not aspirate puree, residuals are much worse.  If pt is to continue po, it must be with accepted risks of  aspiration/malnutrition. SLP to follow up for pt/family education and  help in care plan. Marland Kitchen   Options: NPO with oral care *ice chips after oral care Or  Continue diet with aspiration risks accepted and strategies to mitigate  risk. Pt expressed desire to this SLP to continue to eat saying "well  it's ok".   Donavan Burnet, MS [facility] SLP [phone]                CHL IP TREATMENT RECOMMENDATION 08/26/2014  Treatment Recommendations Therapy as outlined in treatment plan below     CHL IP DIET RECOMMENDATION 08/26/2014  SLP Diet Recommendations NPO;Ice chips PRN after oral care  Liquid Administration via (None)  Medication Administration Via alternative means  Compensations (None)  Postural Changes and/or Swallow Maneuvers (None)     CHL IP OTHER RECOMMENDATIONS 08/26/2014  Recommended Consults (None)  Oral Care Recommendations Oral care QID  Other Recommendations (None)     CHL IP FOLLOW UP RECOMMENDATIONS 07/04/2014  Follow up Recommendations None     CHL IP FREQUENCY AND DURATION 08/26/2014  Speech Therapy Frequency (ACUTE ONLY) (None)  Treatment Duration 1 week      CHL IP ORAL PHASE 08/26/2014  Lips (None)  Tongue (None)  Mucous  membranes (  None)  Nutritional status (None)  Other (None)  Oxygen therapy (None)  Oral Phase Impaired  Oral - Pudding Teaspoon (None)  Oral - Pudding Cup (None)  Oral - Honey Teaspoon (None)  Oral - Honey Cup (None)  Oral - Honey Syringe (None)  Oral - Nectar Teaspoon (None)  Oral - Nectar Cup (None)  Oral - Nectar Straw (None)  Oral - Nectar Syringe (None)  Oral - Ice Chips (None)  Oral - Thin Teaspoon (None)  Oral - Thin Cup (None)  Oral - Thin Straw (None)  Oral - Thin Syringe (None)  Oral - Puree (None)  Oral - Mechanical Soft (None)  Oral - Regular (None)  Oral - Multi-consistency (None)  Oral - Pill (None)  Oral Phase - Comment (None)      CHL IP PHARYNGEAL PHASE 08/26/2014  Pharyngeal Phase Impaired  Pharyngeal - Pudding Teaspoon (None)  Penetration/Aspiration details (pudding teaspoon) (None)  Pharyngeal - Pudding Cup (None)  Penetration/Aspiration details (pudding cup) (None)  Pharyngeal - Honey Teaspoon (None)  Penetration/Aspiration details (honey teaspoon) (None)  Pharyngeal - Honey Cup (None)  Penetration/Aspiration details (honey cup) (None)  Pharyngeal - Honey Syringe (None)  Penetration/Aspiration details (honey syringe) (None)  Pharyngeal - Nectar Teaspoon (None)  Penetration/Aspiration details (nectar teaspoon) (None)  Pharyngeal - Nectar Cup (None)  Penetration/Aspiration details (nectar cup) (None)  Pharyngeal - Nectar Straw (None)  Penetration/Aspiration details (nectar straw) (None)  Pharyngeal - Nectar Syringe (None)  Penetration/Aspiration details (nectar syringe) (None)  Pharyngeal - Ice Chips (None)  Penetration/Aspiration details (ice chips) (None)  Pharyngeal - Thin Teaspoon (None)  Penetration/Aspiration details (thin teaspoon) (None)  Pharyngeal - Thin Cup (None)  Penetration/Aspiration details (thin cup) (None)  Pharyngeal - Thin Straw (None)  Penetration/Aspiration details (thin straw) (None)  Pharyngeal - Thin Syringe (None)  Penetration/Aspiration details (thin syringe') (None)   Pharyngeal - Puree (None)  Penetration/Aspiration details (puree) (None)  Pharyngeal - Mechanical Soft (None)  Penetration/Aspiration details (mechanical soft) (None)  Pharyngeal - Regular (None)  Penetration/Aspiration details (regular) (None)  Pharyngeal - Multi-consistency (None)  Penetration/Aspiration details (multi-consistency) (None)  Pharyngeal - Pill (None)  Penetration/Aspiration details (pill) (None)  Pharyngeal Comment head turn right/left with and without chin tuck not  effective to prevent residuals nor aspiriation, fortunately pt was strong  enough to expectorate secretions mixed with barium clearing approximately  90% of barium residuals from pharynx, aspiration varied from mild to  severe amount with sensation only noted at severe amounts     CHL IP CERVICAL ESOPHAGEAL PHASE 08/26/2014  Cervical Esophageal Phase Impaired  Pudding Teaspoon (None)  Pudding Cup (None)  Honey Teaspoon (None)  Honey Cup (None)  Honey Straw (None)  Nectar Teaspoon Reduced cricopharyngeal relaxation  Nectar Cup Reduced cricopharyngeal relaxation  Nectar Straw Reduced cricopharyngeal relaxation  Nectar Sippy Cup (None)  Thin Teaspoon Reduced cricopharyngeal relaxation  Thin Cup Reduced cricopharyngeal relaxation  Thin Straw (None)  Thin Sippy Cup (None)  Cervical Esophageal Comment very poor clearance into esophagus resulting  in gross residuals and subsequent aspiration across all consistencies  *except puree    No flowsheet data found.        Donavan Burnet, MS [facility] SLP [phone]    Dg Hip Unilat With Pelvis 1v Right  08/19/2014   CLINICAL DATA:  RIGHT hip pain.  EXAM: RIGHT HIP (WITH PELVIS) 1 VIEW  COMPARISON:  None.  FINDINGS: There is no evidence of hip fracture or dislocation. There is no evidence of arthropathy or other focal bone abnormality. Marked vascular  calcification. Previous iliac stent placement with endovascular coiling.  IMPRESSION: No acute abnormality is evident.   Electronically Signed   By: Davonna Belling M.D.   On: 08/19/2014 14:39   Ir Nephrostomy Exchange Right  08/31/2014   CLINICAL DATA:  81 year old male with chronic right UPJ obstruction and indwelling percutaneous nephrostomy tube. He pulled his tube without yesterday and presents for attempted salvage.  EXAM: IR EXCHANGE NEPHROSTOMY RIGHT  Date: 08/31/2014  PROCEDURE: 1. Replacement of inadvertently dislodged right-sided percutaneous nephrostomy tube with fluoroscopic guidance Interventional Radiologist:  Sterling Big, MD  ANESTHESIA/SEDATION: None required.  MEDICATIONS: 400 mg ciprofloxacin administered intravenously within 1 hour of procedure  FLUOROSCOPY TIME:  1 minutes 24 seconds  55 mGy  CONTRAST:  20 mL Omnipaque 300 administered intravenously  TECHNIQUE: Informed consent was obtained from the patient following explanation of the procedure, risks, benefits and alternatives. The patient understands, agrees and consents for the procedure. All questions were addressed. A time out was performed.  Maximal barrier sterile technique utilized including caps, mask, sterile gowns, sterile gloves, large sterile drape, hand hygiene, and Betadine skin prep.  The skin exit site of the prior percutaneous nephrostomy tube was gently cannulated with a 5 French catheter. A hand injection of contrast material identified a path into the right renal collecting system. The catheter was navigated into the collecting system. There is marked hydronephrosis. A wire was coiled in the dilated renal pelvis and a Cook 10 [place] all-purpose drainage catheter advanced over the wire and formed. There was copious return of dark bloody urine consistent with old blood.  The nephrostomy tube was secured to the skin with 2 Prolene sutures and an adhesive fixation device in an effort to prevent further inadvertent dislodgement. The patient tolerated the procedure well.  COMPLICATIONS: None  IMPRESSION: 1. Successful replacement of the dislodged 10 French right-sided  percutaneous nephrostomy tube. 2. Moderate hydronephrosis with a large volume of old blood in the renal collecting system. The inadvertent dislodgement of the prior tube may have been traumatic.  Signed,  Sterling Big, MD  [facility]  [facility]   Electronically Signed   By: Malachy Moan M.D.   On: 08/31/2014 11:39   Ir Nephrostomy Exchange Right  08/29/2014   CLINICAL DATA:  Chronic right UPJ obstruction, painful retracted right nephrostomy  EXAM: FLUOROSCOPIC REPOSITION AND EXCHANGE OF THE CHRONIC RIGHT NEPHROSTOMY  Date:  5/5/20165/08/2014 12:24 pm  Radiologist:  M. Ruel Favors, MD  Guidance:  Fluoroscopic  MEDICATIONS: None.  FLUOROSCOPY TIME:  1 minutes 30 seconds, 46 mGy  MEDICATIONS AND MEDICAL HISTORY: None.  ANESTHESIA/SEDATION: None.  CONTRAST:  20mL OMNIPAQUE IOHEXOL 300 MG/ML  SOLN  COMPLICATIONS: None immediate  PROCEDURE: Informed consent was obtained from the patient following explanation of the procedure, risks, benefits and alternatives. The patient understands, agrees and consents for the procedure. All questions were addressed. A time out was performed.  Maximal barrier sterile technique utilized including caps, mask, sterile gowns, sterile gloves, large sterile drape, hand hygiene, and Betadine.  Under sterile conditions, the existing nephrostomy catheter was injected with contrast and demonstrated to be just outside the kidney and collecting system. Percutaneous tract remains patent to the collecting system. Catheter was cut and removed over a guidewire. Catheter and guidewire were advanced into the renal pelvis. Position confirmed with contrast. Over an Amplatz guidewire, a new 10 French nephrostomy was advanced with the retention loop formed in the renal pelvis. Position confirmed with fluoroscopy. Catheter secured  with a Prolene suture and connected to external drainage bag. No immediate complication. Patient tolerated the  procedure well.  IMPRESSION: Successful fluoroscopic reposition and exchange of the right 10 [place] nephrostomy   Electronically Signed   By: Judie Petit.  Shick M.D.   On: 08/29/2014 12:43    Signed:  Pamella Pert  [facility] 09/07/2014, 4:30 PM

## 2014-09-25 DEATH — deceased

## 2014-10-03 ENCOUNTER — Other Ambulatory Visit (HOSPITAL_COMMUNITY): Payer: Medicare Other

## 2014-10-08 ENCOUNTER — Ambulatory Visit: Payer: Medicare Other | Admitting: Family Medicine

## 2015-01-01 ENCOUNTER — Other Ambulatory Visit (HOSPITAL_COMMUNITY): Payer: Medicare Other

## 2015-01-01 ENCOUNTER — Ambulatory Visit: Payer: Medicare Other | Admitting: Vascular Surgery

## 2015-07-06 IMAGING — CR DG HIP (WITH OR WITHOUT PELVIS) 1V*R*
3 series · 3 of 3 positions shown · non-contrast
Comparison: None.

CLINICAL DATA: RIGHT hip pain.

EXAM:
RIGHT HIP (WITH PELVIS) 1 VIEW

[t pelvis a.p.]
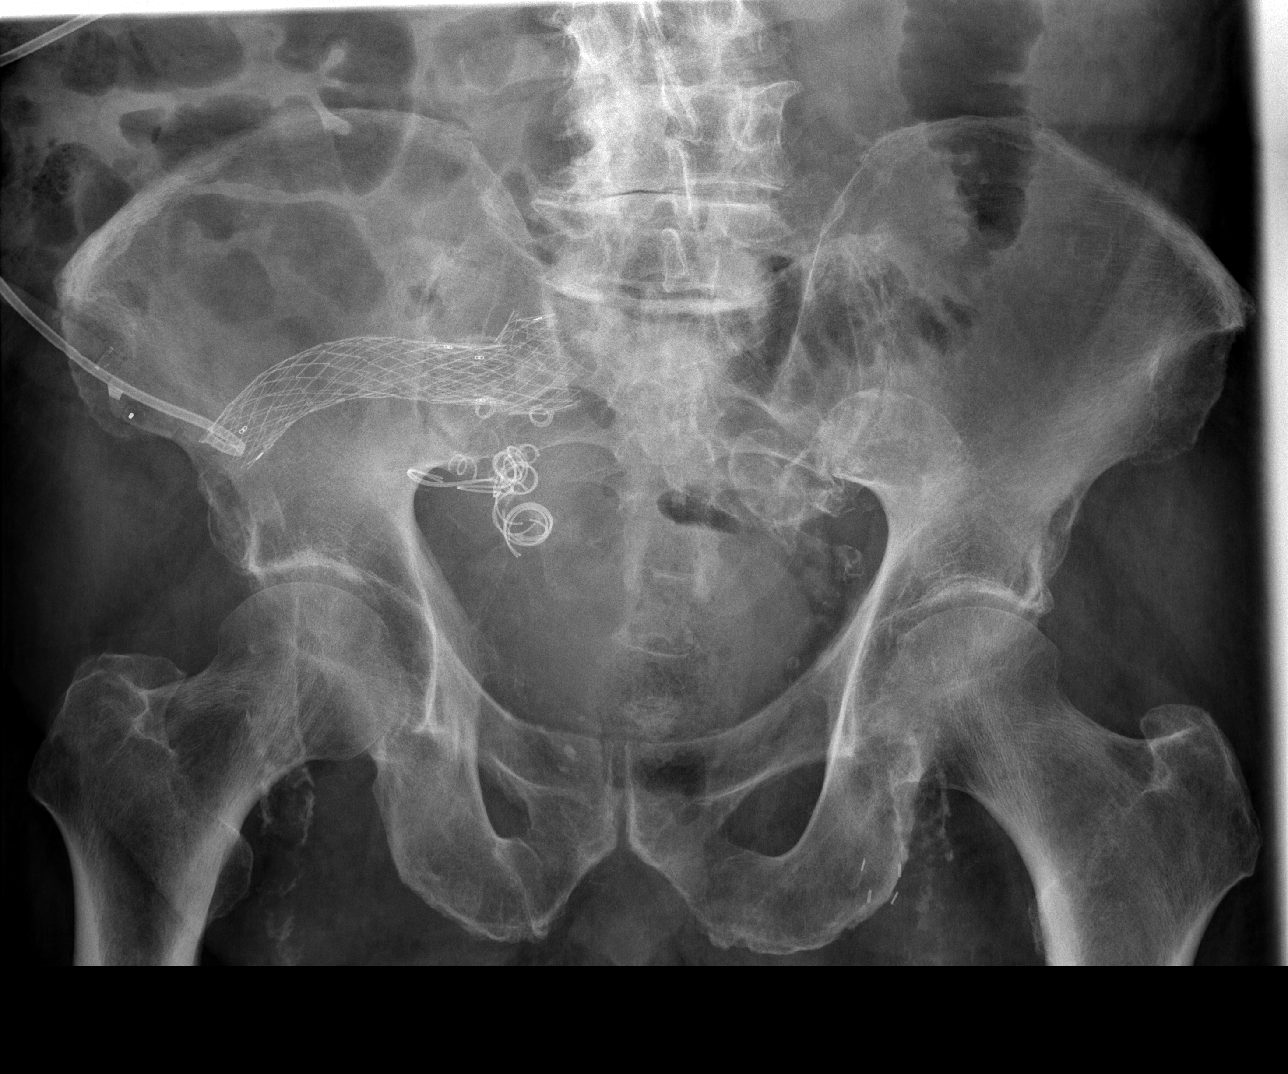

[t hip ap right]
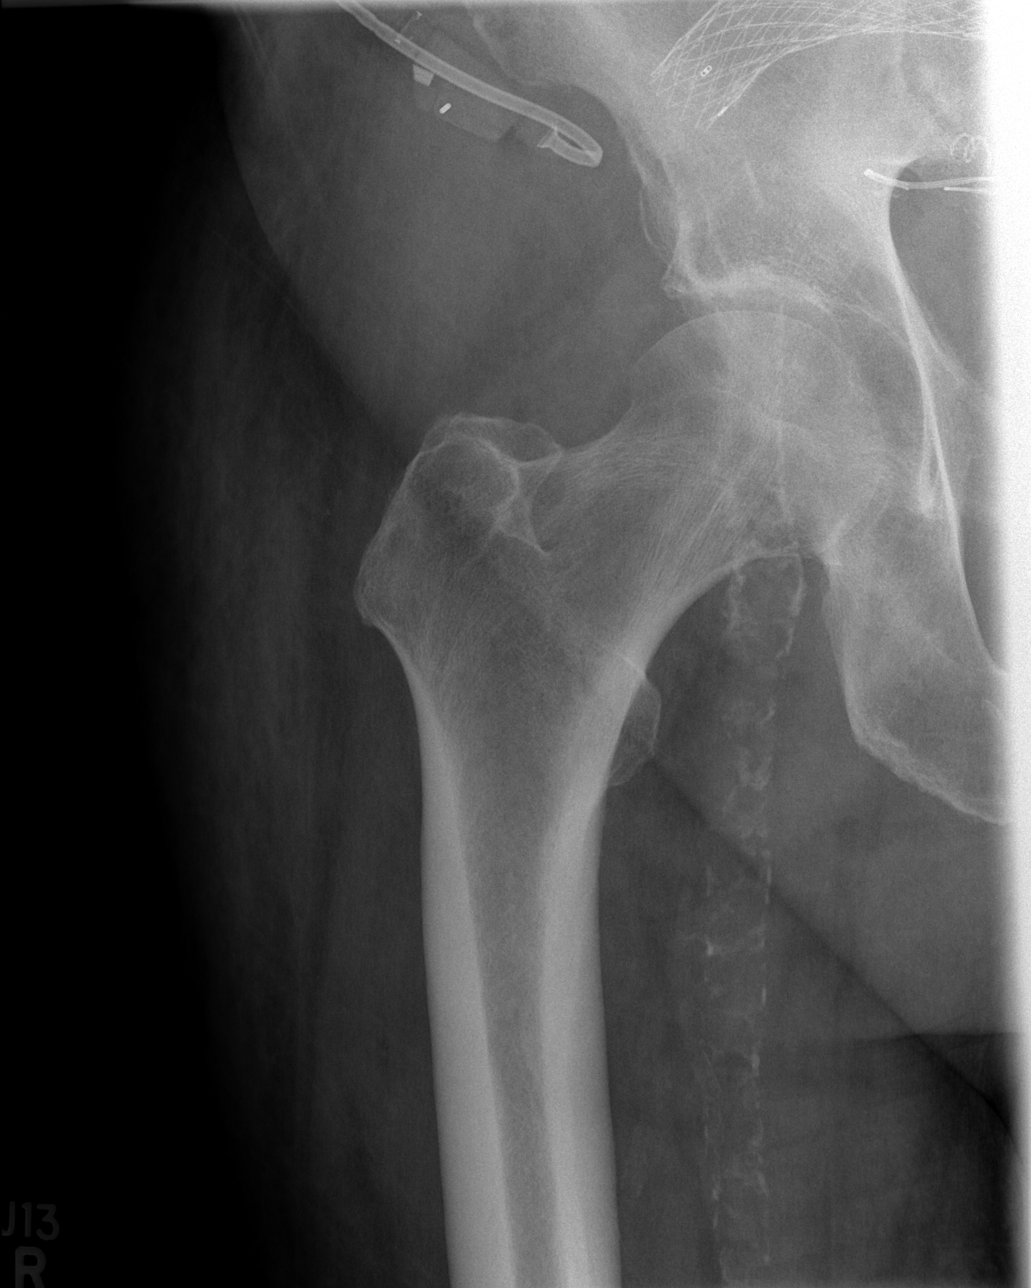

[t hip frog leg right]
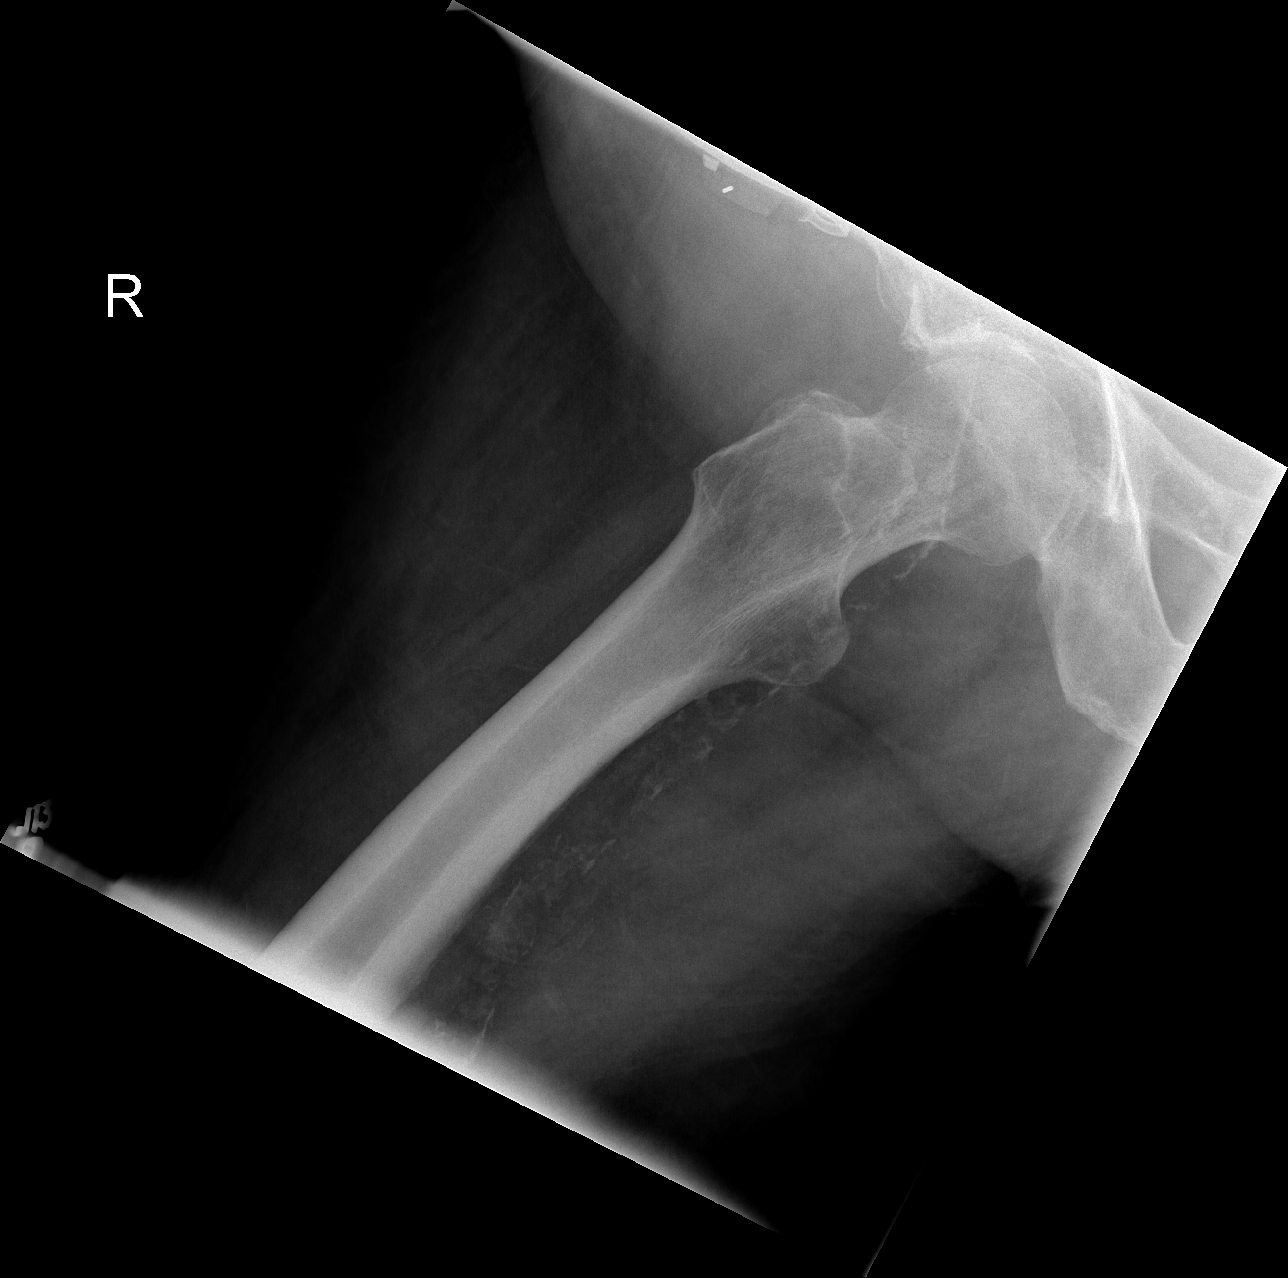

[3 of 3 positions shown; findings below may reference images not displayed]

FINDINGS: There is no evidence of hip fracture or dislocation. There is no
evidence of arthropathy or other focal bone abnormality. Marked
vascular calcification. Previous iliac stent placement with
endovascular coiling.
IMPRESSION: No acute abnormality is evident.

## 2015-07-07 IMAGING — CR DG CHEST 1V PORT
1 series · 1 of 1 positions shown · non-contrast
Comparison: 08/18/2014.

CLINICAL DATA: Pleural effusion.  Cardiomyopathy.

EXAM:
PORTABLE CHEST - 1 VIEW

[AP]
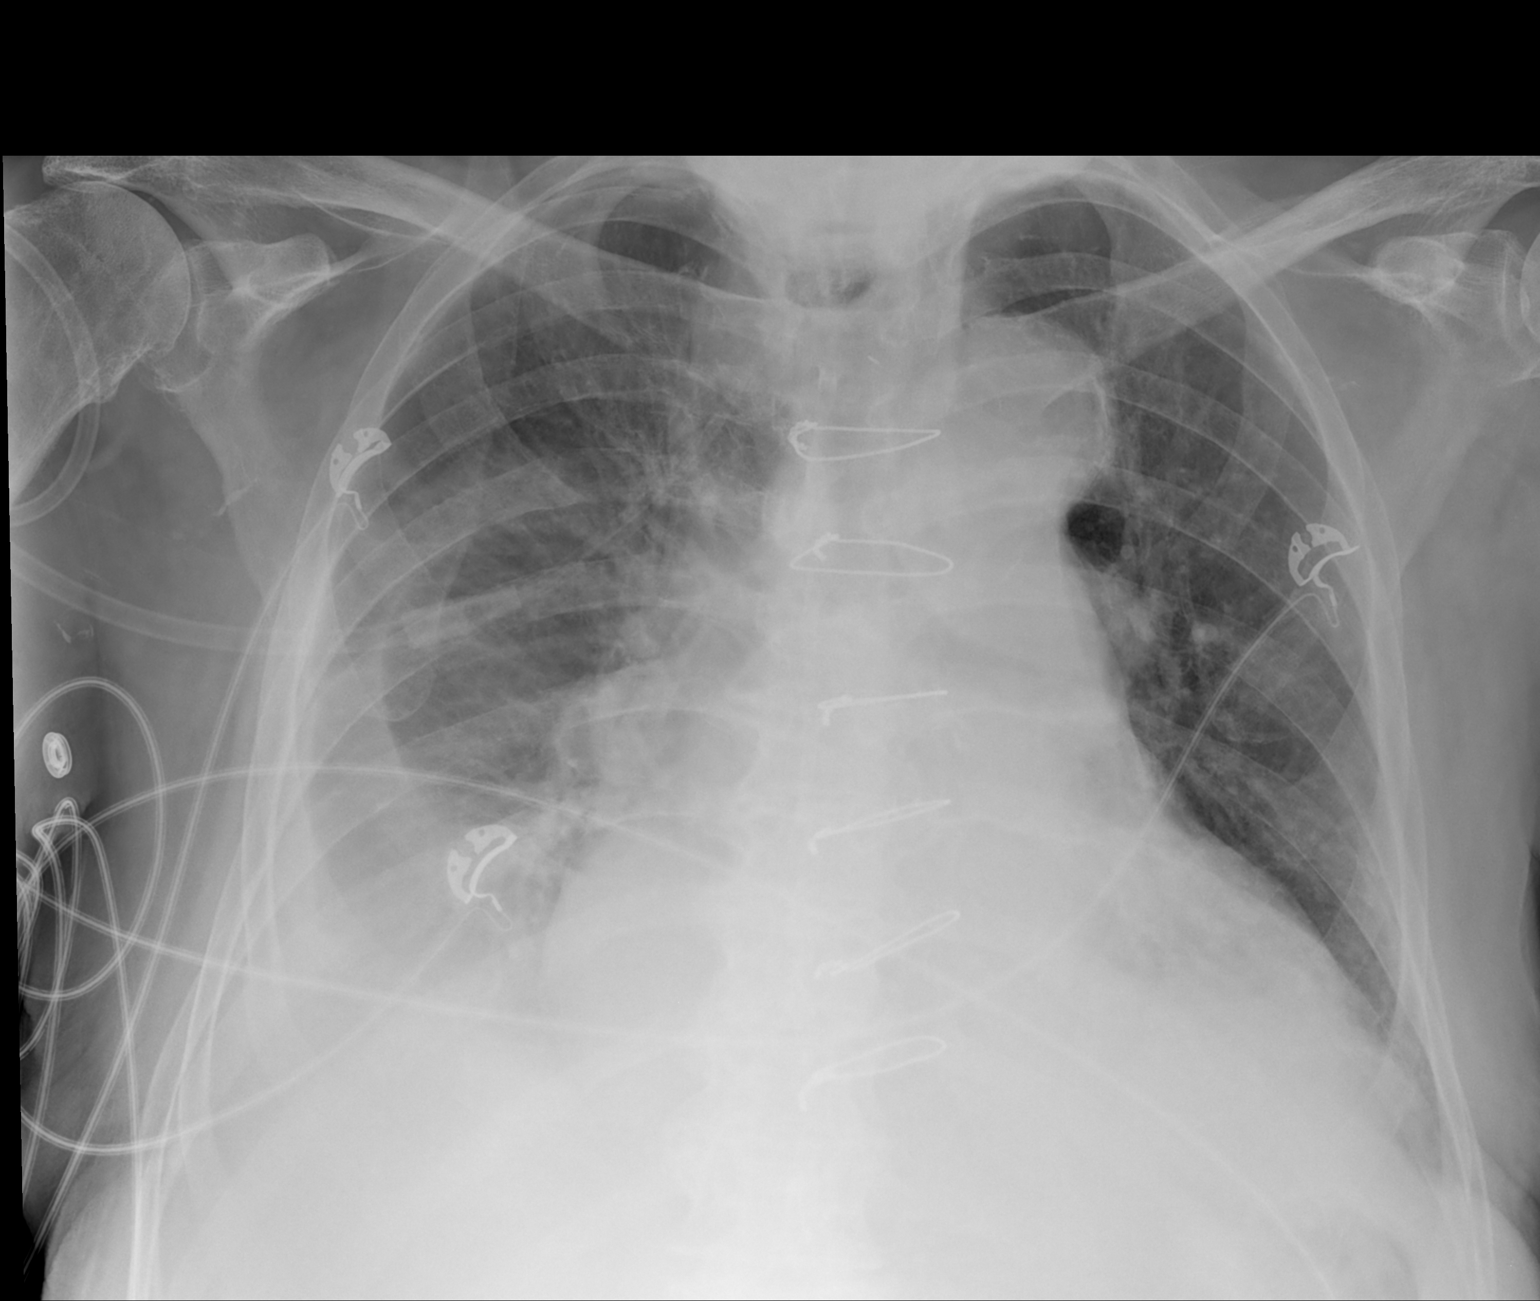

[1 of 1 positions shown; findings below may reference images not displayed]

FINDINGS: The heart is enlarged. A moderate size right pleural effusion may be
slightly increased this likely layering with patient at night angle.
A small left pleural effusion is now evident. Mild edema has
increased. Bibasilar airspace disease is noted. The visualized soft
tissues and bony thorax are unremarkable.
IMPRESSION: 1. Congestive heart failure with progressive edema.
2. Stable slight increase in right pleural effusion.
3. New small left pleural effusion.

## 2015-07-08 IMAGING — DX DG CHEST 1V PORT
1 series · 2 of 2 positions shown · non-contrast
Comparison: 08/18/2014.

CLINICAL DATA: Pleural effusion.

EXAM:
PORTABLE CHEST - 1 VIEW

[Series 1: chest ap · 0.14mm/px · 2 of 2 slices shown]
[im 1/2]
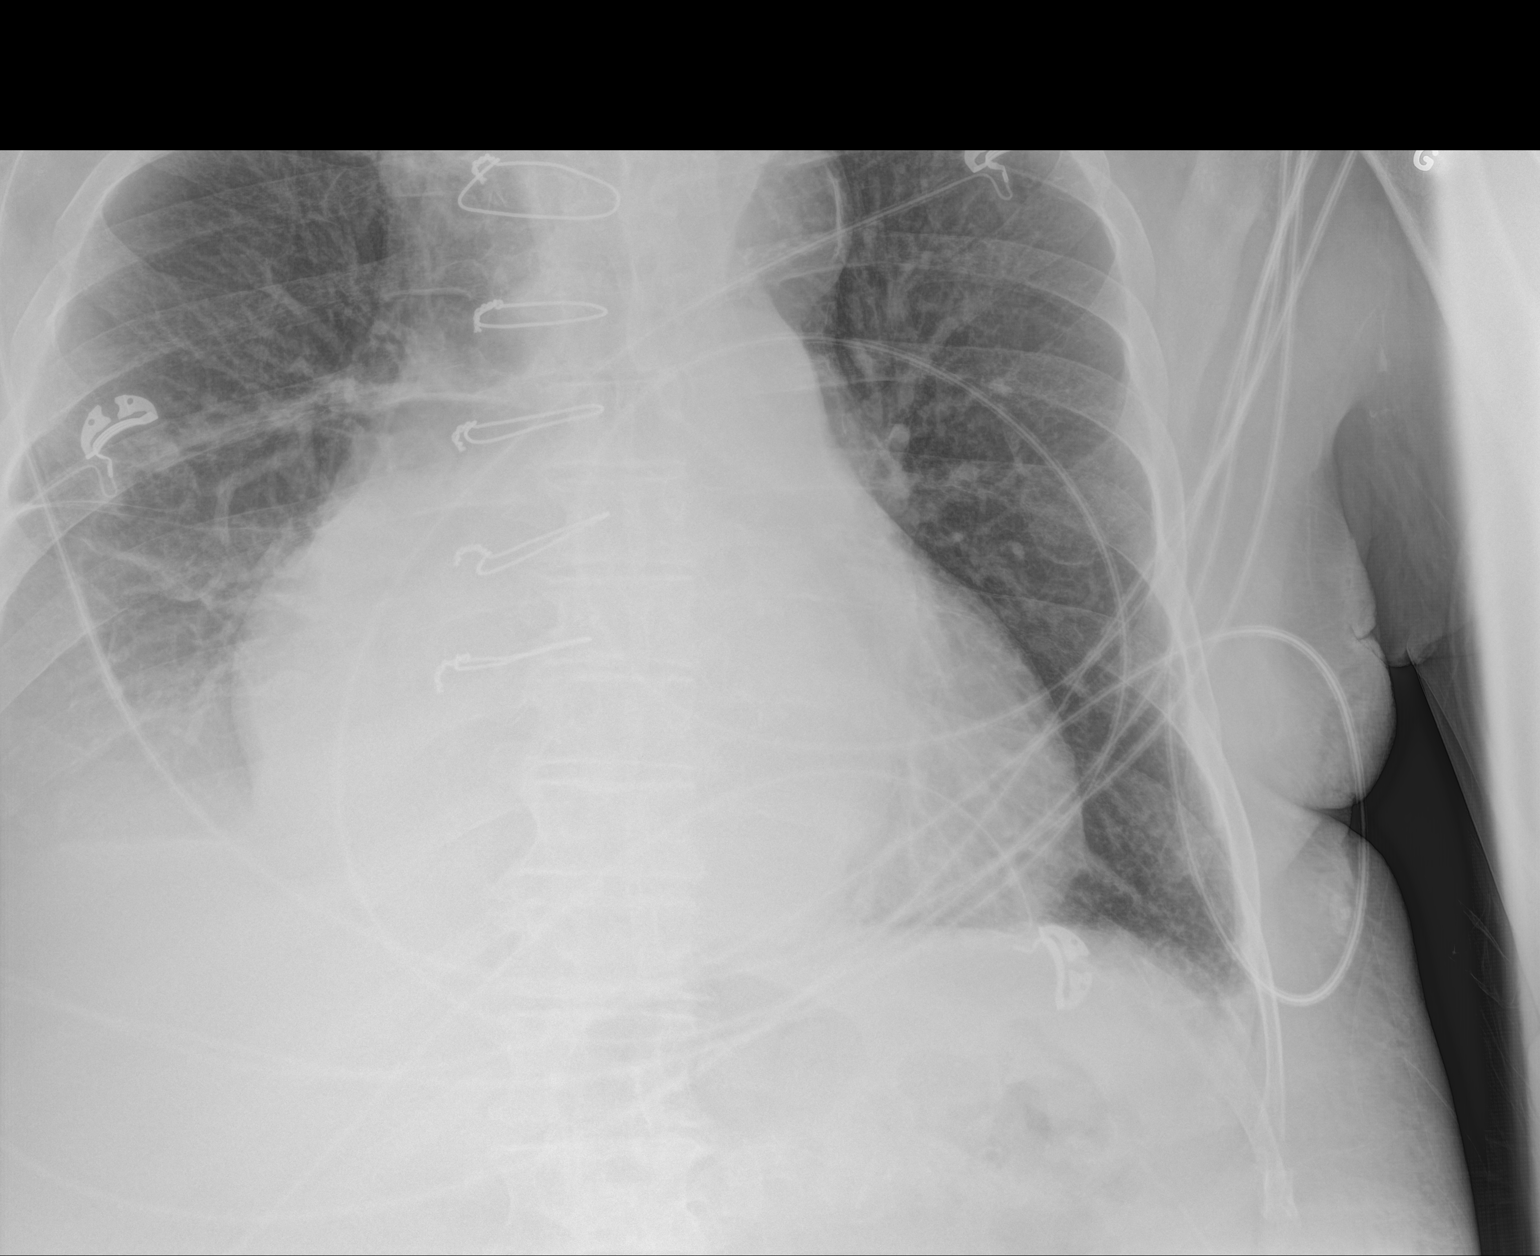
[im 2/2]
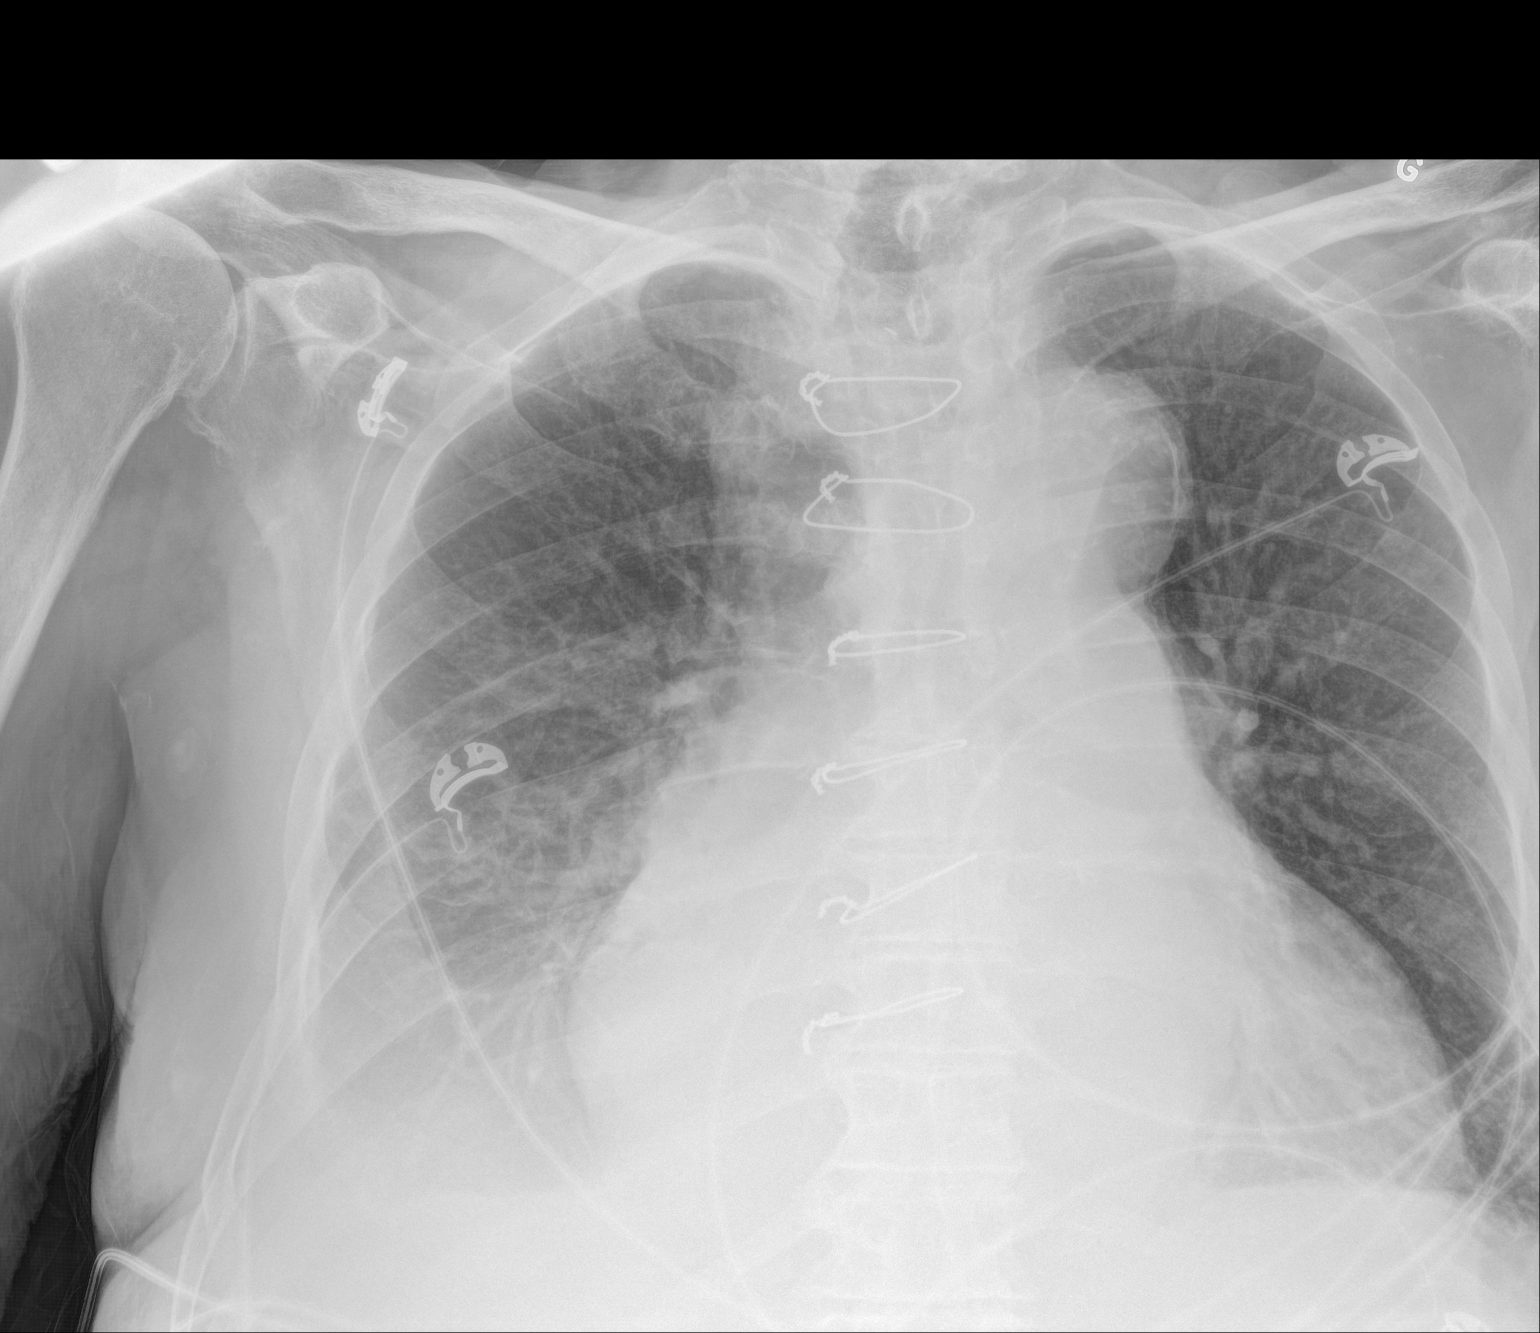

[2 of 2 positions shown; findings below may reference images not displayed]

FINDINGS: Mediastinum hilar structures stable. Prior median sternotomy. Stable
cardiomegaly. Mild pulmonary vascular prominence and interstitial
prominence suggesting mild congestive heart failure. Right pleural
effusion again noted and unchanged. Tiny left pleural effusion
cannot be excluded. No pneumothorax.
IMPRESSION: Findings consistent with mild congestive heart failure with mild
pulmonary interstitial edema and stable right pleural effusion. Tiny
left pleural effusion cannot be excluded . Prior median sternotomy.
